# Patient Record
Sex: Female | Born: 1960
Health system: Southern US, Community
[De-identification: ages and names within clinical notes are randomized; demographics above are authoritative.]

## PROBLEM LIST (undated history)

## (undated) DIAGNOSIS — D649 Anemia, unspecified: Secondary | ICD-10-CM

## (undated) DIAGNOSIS — E785 Hyperlipidemia, unspecified: Secondary | ICD-10-CM

## (undated) DIAGNOSIS — N289 Disorder of kidney and ureter, unspecified: Secondary | ICD-10-CM

## (undated) DIAGNOSIS — I10 Essential (primary) hypertension: Secondary | ICD-10-CM

## (undated) DIAGNOSIS — Z923 Personal history of irradiation: Secondary | ICD-10-CM

## (undated) DIAGNOSIS — C801 Malignant (primary) neoplasm, unspecified: Secondary | ICD-10-CM

## (undated) DIAGNOSIS — Z8659 Personal history of other mental and behavioral disorders: Secondary | ICD-10-CM

## (undated) DIAGNOSIS — Z9889 Other specified postprocedural states: Secondary | ICD-10-CM

## (undated) DIAGNOSIS — I509 Heart failure, unspecified: Secondary | ICD-10-CM

## (undated) DIAGNOSIS — M25562 Pain in left knee: Secondary | ICD-10-CM

## (undated) DIAGNOSIS — R0600 Dyspnea, unspecified: Secondary | ICD-10-CM

## (undated) HISTORY — DX: Hyperlipidemia, unspecified: E78.5

## (undated) HISTORY — PX: CHOLECYSTECTOMY: SHX55

## (undated) HISTORY — PX: DILATION AND CURETTAGE OF UTERUS: SHX78

## (undated) HISTORY — DX: Pain in left knee: M25.562

## (undated) HISTORY — DX: Anemia, unspecified: D64.9

## (undated) HISTORY — PX: BREAST BIOPSY: SHX20

## (undated) HISTORY — DX: Other specified postprocedural states: Z98.890

## (undated) HISTORY — DX: Essential (primary) hypertension: I10

---

## 1999-04-08 ENCOUNTER — Emergency Department (HOSPITAL_COMMUNITY): Admission: EM | Admit: 1999-04-08 | Discharge: 1999-04-08 | Payer: Self-pay | Admitting: Emergency Medicine

## 1999-04-09 ENCOUNTER — Encounter: Payer: Self-pay | Admitting: Surgery

## 1999-04-09 ENCOUNTER — Encounter: Admission: RE | Admit: 1999-04-09 | Discharge: 1999-04-09 | Payer: Self-pay | Admitting: Surgery

## 2001-07-14 ENCOUNTER — Emergency Department (HOSPITAL_COMMUNITY): Admission: EM | Admit: 2001-07-14 | Discharge: 2001-07-14 | Payer: Self-pay | Admitting: Emergency Medicine

## 2002-07-15 ENCOUNTER — Inpatient Hospital Stay (HOSPITAL_COMMUNITY): Admission: EM | Admit: 2002-07-15 | Discharge: 2002-07-24 | Payer: Self-pay | Admitting: Emergency Medicine

## 2002-07-15 ENCOUNTER — Encounter: Payer: Self-pay | Admitting: Emergency Medicine

## 2002-07-18 ENCOUNTER — Encounter: Payer: Self-pay | Admitting: *Deleted

## 2002-07-22 ENCOUNTER — Encounter: Payer: Self-pay | Admitting: *Deleted

## 2002-08-23 ENCOUNTER — Emergency Department (HOSPITAL_COMMUNITY): Admission: EM | Admit: 2002-08-23 | Discharge: 2002-08-23 | Payer: Self-pay | Admitting: Emergency Medicine

## 2004-01-18 ENCOUNTER — Emergency Department (HOSPITAL_COMMUNITY): Admission: EM | Admit: 2004-01-18 | Discharge: 2004-01-18 | Payer: Self-pay | Admitting: Emergency Medicine

## 2004-06-16 ENCOUNTER — Emergency Department (HOSPITAL_COMMUNITY): Admission: EM | Admit: 2004-06-16 | Discharge: 2004-06-16 | Payer: Self-pay | Admitting: Emergency Medicine

## 2006-09-04 ENCOUNTER — Ambulatory Visit: Payer: Self-pay | Admitting: Nurse Practitioner

## 2006-09-13 ENCOUNTER — Ambulatory Visit: Payer: Self-pay | Admitting: *Deleted

## 2006-09-18 ENCOUNTER — Encounter: Admission: RE | Admit: 2006-09-18 | Discharge: 2006-09-18 | Payer: Self-pay | Admitting: Family Medicine

## 2006-09-22 ENCOUNTER — Encounter: Admission: RE | Admit: 2006-09-22 | Discharge: 2006-09-22 | Payer: Self-pay | Admitting: Nurse Practitioner

## 2006-09-26 ENCOUNTER — Ambulatory Visit: Payer: Self-pay | Admitting: Nurse Practitioner

## 2006-09-26 ENCOUNTER — Encounter (INDEPENDENT_AMBULATORY_CARE_PROVIDER_SITE_OTHER): Payer: Self-pay | Admitting: Nurse Practitioner

## 2006-09-26 ENCOUNTER — Other Ambulatory Visit: Admission: RE | Admit: 2006-09-26 | Discharge: 2006-09-26 | Payer: Self-pay | Admitting: Family Medicine

## 2006-09-26 ENCOUNTER — Encounter (INDEPENDENT_AMBULATORY_CARE_PROVIDER_SITE_OTHER): Payer: Self-pay | Admitting: *Deleted

## 2006-10-26 ENCOUNTER — Ambulatory Visit (HOSPITAL_COMMUNITY): Admission: RE | Admit: 2006-10-26 | Discharge: 2006-10-26 | Payer: Self-pay | Admitting: Internal Medicine

## 2006-10-29 ENCOUNTER — Emergency Department (HOSPITAL_COMMUNITY): Admission: EM | Admit: 2006-10-29 | Discharge: 2006-10-29 | Payer: Self-pay | Admitting: Emergency Medicine

## 2006-11-06 ENCOUNTER — Ambulatory Visit: Payer: Self-pay | Admitting: Internal Medicine

## 2006-12-20 ENCOUNTER — Ambulatory Visit: Payer: Self-pay | Admitting: Family Medicine

## 2007-12-25 ENCOUNTER — Emergency Department (HOSPITAL_COMMUNITY): Admission: EM | Admit: 2007-12-25 | Discharge: 2007-12-25 | Payer: Self-pay | Admitting: Emergency Medicine

## 2008-01-15 ENCOUNTER — Emergency Department (HOSPITAL_COMMUNITY): Admission: EM | Admit: 2008-01-15 | Discharge: 2008-01-15 | Payer: Self-pay | Admitting: Emergency Medicine

## 2008-11-16 ENCOUNTER — Emergency Department (HOSPITAL_COMMUNITY): Admission: EM | Admit: 2008-11-16 | Discharge: 2008-11-16 | Payer: Self-pay | Admitting: Emergency Medicine

## 2008-12-04 ENCOUNTER — Ambulatory Visit: Payer: Self-pay | Admitting: Internal Medicine

## 2008-12-04 ENCOUNTER — Encounter: Payer: Self-pay | Admitting: Internal Medicine

## 2008-12-04 DIAGNOSIS — H6690 Otitis media, unspecified, unspecified ear: Secondary | ICD-10-CM

## 2008-12-04 DIAGNOSIS — J209 Acute bronchitis, unspecified: Secondary | ICD-10-CM

## 2008-12-04 HISTORY — DX: Acute bronchitis, unspecified: J20.9

## 2008-12-04 HISTORY — DX: Otitis media, unspecified, unspecified ear: H66.90

## 2009-02-09 ENCOUNTER — Ambulatory Visit: Payer: Self-pay | Admitting: Family Medicine

## 2009-02-09 ENCOUNTER — Encounter (INDEPENDENT_AMBULATORY_CARE_PROVIDER_SITE_OTHER): Payer: Self-pay | Admitting: Family Medicine

## 2009-02-09 LAB — CONVERTED CEMR LAB
ALT: 27 units/L (ref 0–35)
Albumin: 4.1 g/dL (ref 3.5–5.2)
CO2: 22 meq/L (ref 19–32)
Calcium: 9.2 mg/dL (ref 8.4–10.5)
Chloride: 103 meq/L (ref 96–112)
Cholesterol: 276 mg/dL — ABNORMAL HIGH (ref 0–200)
Sodium: 138 meq/L (ref 135–145)
Total Protein: 7 g/dL (ref 6.0–8.3)

## 2009-02-13 ENCOUNTER — Telehealth (INDEPENDENT_AMBULATORY_CARE_PROVIDER_SITE_OTHER): Payer: Self-pay | Admitting: *Deleted

## 2009-03-02 ENCOUNTER — Ambulatory Visit: Payer: Self-pay | Admitting: Internal Medicine

## 2009-03-18 ENCOUNTER — Observation Stay (HOSPITAL_COMMUNITY): Admission: AD | Admit: 2009-03-18 | Discharge: 2009-03-20 | Payer: Self-pay | Admitting: Obstetrics & Gynecology

## 2009-03-18 ENCOUNTER — Encounter: Payer: Self-pay | Admitting: Emergency Medicine

## 2009-03-19 ENCOUNTER — Encounter: Payer: Self-pay | Admitting: Obstetrics & Gynecology

## 2009-03-30 ENCOUNTER — Encounter (INDEPENDENT_AMBULATORY_CARE_PROVIDER_SITE_OTHER): Payer: Self-pay | Admitting: Internal Medicine

## 2009-03-30 ENCOUNTER — Ambulatory Visit: Payer: Self-pay | Admitting: Internal Medicine

## 2009-03-30 LAB — CONVERTED CEMR LAB
Albumin: 4 g/dL (ref 3.5–5.2)
CO2: 25 meq/L (ref 19–32)
Calcium: 9.2 mg/dL (ref 8.4–10.5)
Chloride: 105 meq/L (ref 96–112)
Cholesterol: 228 mg/dL — ABNORMAL HIGH (ref 0–200)
Glucose, Bld: 99 mg/dL (ref 70–99)
Potassium: 4 meq/L (ref 3.5–5.3)
Sodium: 142 meq/L (ref 135–145)
Total Bilirubin: 0.4 mg/dL (ref 0.3–1.2)
Total Protein: 6.5 g/dL (ref 6.0–8.3)
Triglycerides: 139 mg/dL (ref <150)

## 2009-05-27 ENCOUNTER — Ambulatory Visit: Payer: Self-pay | Admitting: Internal Medicine

## 2009-06-23 ENCOUNTER — Ambulatory Visit: Payer: Self-pay | Admitting: Internal Medicine

## 2009-07-10 ENCOUNTER — Ambulatory Visit: Payer: Self-pay | Admitting: Internal Medicine

## 2009-09-21 ENCOUNTER — Ambulatory Visit: Payer: Self-pay | Admitting: Internal Medicine

## 2009-10-23 ENCOUNTER — Encounter (INDEPENDENT_AMBULATORY_CARE_PROVIDER_SITE_OTHER): Payer: Self-pay | Admitting: Adult Health

## 2009-10-23 ENCOUNTER — Ambulatory Visit: Payer: Self-pay | Admitting: Internal Medicine

## 2009-10-23 LAB — CONVERTED CEMR LAB
Amphetamine Screen, Ur: NEGATIVE
Benzodiazepines.: NEGATIVE
Cocaine Metabolites: POSITIVE — AB
Creatinine,U: 395.6 mg/dL
Microalb, Ur: 11.85 mg/dL — ABNORMAL HIGH (ref 0.00–1.89)
Pap Smear: NEGATIVE
Phencyclidine (PCP): NEGATIVE

## 2009-11-05 ENCOUNTER — Ambulatory Visit (HOSPITAL_COMMUNITY): Admission: RE | Admit: 2009-11-05 | Discharge: 2009-11-05 | Payer: Self-pay | Admitting: Internal Medicine

## 2010-09-23 LAB — CBC
HCT: 27.4 % — ABNORMAL LOW (ref 36.0–46.0)
Hemoglobin: 9.5 g/dL — ABNORMAL LOW (ref 12.0–15.0)
Platelets: 174 10*3/uL (ref 150–400)
WBC: 7.9 10*3/uL (ref 4.0–10.5)

## 2010-09-24 LAB — CBC
MCHC: 33.8 g/dL (ref 30.0–36.0)
MCV: 89 fL (ref 78.0–100.0)
Platelets: 235 10*3/uL (ref 150–400)
Platelets: 213 10*3/uL (ref 150–400)
RBC: 4.14 MIL/uL (ref 3.87–5.11)
WBC: 12.2 10*3/uL — ABNORMAL HIGH (ref 4.0–10.5)

## 2010-09-24 LAB — POCT I-STAT, CHEM 8
BUN: 6 mg/dL (ref 6–23)
Creatinine, Ser: 0.6 mg/dL (ref 0.4–1.2)
Glucose, Bld: 131 mg/dL — ABNORMAL HIGH (ref 70–99)
Hemoglobin: 12.9 g/dL (ref 12.0–15.0)
Potassium: 2.9 mEq/L — ABNORMAL LOW (ref 3.5–5.1)

## 2010-09-24 LAB — URINE MICROSCOPIC-ADD ON

## 2010-09-24 LAB — BASIC METABOLIC PANEL
BUN: 10 mg/dL (ref 6–23)
CO2: 24 mEq/L (ref 19–32)
Calcium: 7.6 mg/dL — ABNORMAL LOW (ref 8.4–10.5)
Calcium: 8.3 mg/dL — ABNORMAL LOW (ref 8.4–10.5)
Creatinine, Ser: 0.95 mg/dL (ref 0.4–1.2)
Creatinine, Ser: 0.97 mg/dL (ref 0.4–1.2)
GFR calc non Af Amer: >60 mL/min (ref >60)
Glucose, Bld: 120 mg/dL — ABNORMAL HIGH (ref 70–99)
Potassium: 3.9 mEq/L (ref 3.5–5.1)
Sodium: 136 mEq/L (ref 135–145)

## 2010-09-24 LAB — PROTIME-INR
INR: 1 (ref 0.00–1.49)
INR: 1 (ref 0.00–1.49)
Prothrombin Time: 12.9 seconds (ref 11.6–15.2)

## 2010-09-24 LAB — TYPE AND SCREEN
ABO/RH(D): O POS
ABO/RH(D): O POS
Antibody Screen: NEGATIVE
Antibody Screen: NEGATIVE

## 2010-09-24 LAB — URINALYSIS, ROUTINE W REFLEX MICROSCOPIC
Bilirubin Urine: NEGATIVE
Nitrite: NEGATIVE
Specific Gravity, Urine: 1.019 (ref 1.005–1.030)
pH: 7.5 (ref 5.0–8.0)

## 2010-09-24 LAB — DIFFERENTIAL
Basophils Absolute: 0 10*3/uL (ref 0.0–0.1)
Basophils Relative: 0 % (ref 0–1)
Eosinophils Absolute: 0 10*3/uL (ref 0.0–0.7)
Monocytes Relative: 8 % (ref 3–12)
Neutro Abs: 4.7 10*3/uL (ref 1.7–7.7)
Neutrophils Relative %: 59 % (ref 43–77)

## 2010-09-24 LAB — GC/CHLAMYDIA PROBE AMP, GENITAL
Chlamydia, DNA Probe: NEGATIVE
GC Probe Amp, Genital: NEGATIVE

## 2010-09-24 LAB — ABO/RH: ABO/RH(D): O POS

## 2010-09-24 LAB — WET PREP, GENITAL: Yeast Wet Prep HPF POC: NONE SEEN

## 2010-11-05 NOTE — Discharge Summary (Signed)
Angela Gardner, Angela Gardner                         ACCOUNT NO.:  [id number]   MEDICAL RECORD NO.:  [id number]                   PATIENT TYPE:  INP   LOCATION:  0361                                 FACILITY:  [facility]   PHYSICIAN:  Lazaro Arms, M.D.        DATE OF BIRTH:  1961/04/06   DATE OF ADMISSION:  07/15/2002  DATE OF DISCHARGE:  07/24/2002                                 DISCHARGE SUMMARY   PRIMARY CARE PHYSICIAN:  [facility].   DISCHARGE DIAGNOSES:  1. Right lower lobe pneumonia.  2. Microcytic anemia likely secondary to menorrhagia and borderline elevated     blood sugar.   BRIEF HISTORY:  The patient was admitted on the 26th of January with  increasing right pleuritic chest pain, cough and shortness of breath. The  patient came to the emergency room and was found to have a consolidated  right lower lobe pneumonia. Her initial hemoglobin was 9.3 with an MCV of 70  and she initially had an O2 requirement. Over the next week, the patient  slowly weaned off oxygen and her shortness of breath improved and she was  able to ambulate without any difficulty. Her pleuritic chest pain was  eventually well controlled with Vicodin. Her hemoglobin remained stable  throughout the entire hospitalization. The patient was discharged home in  stable condition on the February 4th. She was able to eat breakfast without  any difficulty. She was able to ambulate without any dizziness or  difficulty. She was 98.8, pulse of 60, blood pressure 109/62, respirations  16, she was 98% on room air. Her exam is remarkable for markedly decreased  breath sounds in the right lower lobe. She had a repeat chest x-ray on the  second of February which showed some interval improvement in overall lung  aeration although she still had a relatively significant persistence of the  right lower lobe infiltrate atelectasis and consolidation.   DISCHARGE PLAN:  She is to go home. She was given instructions and  the  number to call [facility] and will sign up for [facility] tomorrow. She  was aware that she needs to follow up next week and that she will need a  repeat chest x-ray in two weeks. If her pneumonia does not clear in two  weeks, she will likely need a chest CT. With her history of smoking, it is  possible that this could also represent a post obstructive pneumonia. In  addition, she will followup with [facility] for a followup blood count and  they will be able to follow her menorrhagia at that point.   DISCHARGE MEDICATIONS:  Ceftin 500 mg p.o. b.i.d. for the next day. She was  given samples of this and iron and sulfate 325 mg p.o. b.i.d. and Vicodin 12  tabs, every 4-6 hours as needed.  Lazaro Arms, M.D.    AMC/MEDQ  D:  07/24/2002  T:  07/24/2002  Job:  [id number]   cc:   [facility]

## 2011-03-17 LAB — DIFFERENTIAL
Basophils Relative: 1
Eosinophils Absolute: 0
Eosinophils Relative: 0
Monocytes Relative: 12
Neutrophils Relative %: 60

## 2011-03-17 LAB — CBC
MCHC: 33.1
MCV: 80.8
RBC: 4.92

## 2011-06-21 HISTORY — PX: BREAST EXCISIONAL BIOPSY: SUR124

## 2011-10-26 ENCOUNTER — Other Ambulatory Visit: Payer: Self-pay | Admitting: Obstetrics and Gynecology

## 2011-10-26 DIAGNOSIS — Z1231 Encounter for screening mammogram for malignant neoplasm of breast: Secondary | ICD-10-CM

## 2011-10-28 ENCOUNTER — Encounter: Payer: Self-pay | Admitting: *Deleted

## 2011-10-28 ENCOUNTER — Ambulatory Visit (INDEPENDENT_AMBULATORY_CARE_PROVIDER_SITE_OTHER): Payer: Self-pay | Admitting: *Deleted

## 2011-10-28 ENCOUNTER — Ambulatory Visit: Payer: Self-pay

## 2011-10-28 ENCOUNTER — Ambulatory Visit (HOSPITAL_COMMUNITY)
Admission: RE | Admit: 2011-10-28 | Discharge: 2011-10-28 | Disposition: A | Discharge status: Home / Self Care | Payer: Self-pay | Source: Ambulatory Visit | Attending: Obstetrics and Gynecology | Admitting: Obstetrics and Gynecology

## 2011-10-28 VITALS — BP 186/105 | HR 101 | Temp 97.6°F | Ht 64.0 in | Wt 226.1 lb

## 2011-10-28 DIAGNOSIS — Z1231 Encounter for screening mammogram for malignant neoplasm of breast: Secondary | ICD-10-CM

## 2011-10-28 DIAGNOSIS — Z1239 Encounter for other screening for malignant neoplasm of breast: Secondary | ICD-10-CM

## 2011-10-28 NOTE — Patient Instructions (Signed)
Taught patient how to perform BSE. Patient did not need a Pap smear today due to last Pap smear was 10/23/09.  Let her know [facility] will cover Pap smears every 3 years unless has a history of abnormal Pap smears. Patient escorted to mammography for screening mammogram. Patient aware of appointment and will be there. Let patient know will follow up with her within the next couple weeks with results. Patient verbalized understanding.

## 2011-10-28 NOTE — Progress Notes (Signed)
No complaints today.  Pap Smear:    Pap smear not performed today. Patients last Pap smear was 10/23/09 at [facility] and was normal. Per patient she has no history of abnormal Pap smears. Pap smear result above is in EPIC.  Physical exam: Breasts Breasts symmetrical. No skin abnormalities bilateral breasts. No nipple retraction bilateral breasts. No nipple discharge bilateral breasts. No lymphadenopathy. No lumps palpated bilateral breasts. No complaints of pain or tenderness on exam.       Pelvic/Bimanual No Pap smear completed today since last Pap smear was 10/23/09 and normal. Pap smear not indicated per [facility] guidelines.

## 2011-11-01 ENCOUNTER — Telehealth: Payer: Self-pay

## 2011-11-01 NOTE — Telephone Encounter (Signed)
Called pt and informed pt that she needed to have further imaging of her left breast.  Pt stated that she was unaware that she needed to call but she did have the # to the [facility] to be able to call and schedule appt.  I informed pt that [facility] will cover her appt.  Pt stated understanding and that she would call to schedule to appt.

## 2011-11-02 ENCOUNTER — Other Ambulatory Visit: Payer: Self-pay | Admitting: Obstetrics and Gynecology

## 2011-11-02 DIAGNOSIS — R928 Other abnormal and inconclusive findings on diagnostic imaging of breast: Secondary | ICD-10-CM

## 2011-11-08 ENCOUNTER — Other Ambulatory Visit: Payer: Self-pay

## 2011-11-11 ENCOUNTER — Encounter: Payer: Self-pay | Admitting: *Deleted

## 2011-11-22 ENCOUNTER — Ambulatory Visit
Admission: RE | Admit: 2011-11-22 | Discharge: 2011-11-22 | Disposition: A | Discharge status: Home / Self Care | Payer: No Typology Code available for payment source | Source: Ambulatory Visit | Attending: Obstetrics and Gynecology | Admitting: Obstetrics and Gynecology

## 2011-11-22 ENCOUNTER — Other Ambulatory Visit: Payer: Self-pay | Admitting: Obstetrics and Gynecology

## 2011-11-22 DIAGNOSIS — R928 Other abnormal and inconclusive findings on diagnostic imaging of breast: Secondary | ICD-10-CM

## 2011-11-22 DIAGNOSIS — N6320 Unspecified lump in the left breast, unspecified quadrant: Secondary | ICD-10-CM

## 2011-11-23 ENCOUNTER — Ambulatory Visit
Admission: RE | Admit: 2011-11-23 | Discharge: 2011-11-23 | Disposition: A | Discharge status: Home / Self Care | Payer: No Typology Code available for payment source | Source: Ambulatory Visit | Attending: Obstetrics and Gynecology | Admitting: Obstetrics and Gynecology

## 2011-11-23 ENCOUNTER — Other Ambulatory Visit: Payer: Self-pay | Admitting: Diagnostic Radiology

## 2011-11-23 ENCOUNTER — Other Ambulatory Visit: Payer: Self-pay | Admitting: Obstetrics and Gynecology

## 2011-11-23 DIAGNOSIS — R928 Other abnormal and inconclusive findings on diagnostic imaging of breast: Secondary | ICD-10-CM

## 2011-11-23 DIAGNOSIS — N6320 Unspecified lump in the left breast, unspecified quadrant: Secondary | ICD-10-CM

## 2011-11-24 ENCOUNTER — Other Ambulatory Visit: Payer: Self-pay | Admitting: Obstetrics and Gynecology

## 2011-11-24 DIAGNOSIS — C50912 Malignant neoplasm of unspecified site of left female breast: Secondary | ICD-10-CM

## 2011-11-28 ENCOUNTER — Encounter (HOSPITAL_COMMUNITY): Payer: Self-pay | Admitting: Pharmacy Technician

## 2011-11-28 ENCOUNTER — Ambulatory Visit (INDEPENDENT_AMBULATORY_CARE_PROVIDER_SITE_OTHER): Payer: PRIVATE HEALTH INSURANCE | Admitting: Surgery

## 2011-11-28 ENCOUNTER — Encounter (INDEPENDENT_AMBULATORY_CARE_PROVIDER_SITE_OTHER): Payer: Self-pay | Admitting: Surgery

## 2011-11-28 ENCOUNTER — Other Ambulatory Visit (INDEPENDENT_AMBULATORY_CARE_PROVIDER_SITE_OTHER): Payer: Self-pay | Admitting: Surgery

## 2011-11-28 VITALS — BP 168/110 | HR 76 | Temp 97.1°F | Ht 64.0 in | Wt 224.4 lb

## 2011-11-28 DIAGNOSIS — D249 Benign neoplasm of unspecified breast: Secondary | ICD-10-CM

## 2011-11-28 HISTORY — DX: Benign neoplasm of unspecified breast: D24.9

## 2011-11-28 NOTE — Progress Notes (Signed)
Patient ID: Angela Gardner, female   DOB: 07/22/1960, 51 y.o.   MRN: [id number]  Chief Complaint  Patient presents with  . Pre-op Exam    eval Lt br palp    HPI Angela Gardner is a 51 y.o. female.  Referred by Dr. 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  She apparently had a lumpectomy, followed by radiation, but no lymph node biopsy or chemotherapy.  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  Recently, she had a routine screening mammogram which showed a mass in the left retroareolar region.  This was biopsied and returned a final diagnosis of intraductal papilloma.  The patient reports longstanding pain in the left breast in addition to some pain in her left arm.  No nipple discharge.  Past Medical History  Diagnosis Date  . Hypertension   . Hyperlipemia   . Hx of left breast biopsy   . Anemia     Past Surgical History  Procedure Date  . Cholecystectomy   . Breast biopsy     Family History  Problem Relation Age of Onset  . Anuerysm Mother     Social History History  Substance Use Topics  . Smoking status: Current Everyday Smoker -- 0.3 packs/day for 26 years    Types: Cigarettes  . Smokeless tobacco: Never Used  . Alcohol Use: Yes     social drinking    Allergies  Allergen Reactions  . Vicodin (Hydrocodone-Acetaminophen)   itching  Current Outpatient Prescriptions  Medication Sig Dispense Refill  . NON FORMULARY Blood pressure medication pt not sure what medication      . pravastatin (PRAVACHOL) 20 MG tablet Take 20 mg by mouth daily.        Review of Systems Review of Systems  Constitutional: Negative for fever, chills and unexpected weight change.  HENT: Negative for hearing loss, congestion, sore throat, trouble swallowing and voice change.   Eyes: Negative for visual disturbance.  Respiratory: Negative for cough and wheezing.   Cardiovascular: Negative for chest pain,  palpitations and leg swelling.  Gastrointestinal: Negative for nausea, vomiting, abdominal pain, diarrhea, constipation, blood in stool, abdominal distention and anal bleeding.  Genitourinary: Negative for hematuria, vaginal bleeding and difficulty urinating.  Musculoskeletal: Negative for arthralgias.  Skin: Negative for rash and wound.  Neurological: Negative for seizures, syncope and headaches.  Hematological: Negative for adenopathy. Does not bruise/bleed easily.  Psychiatric/Behavioral: Negative for confusion.    Blood pressure 168/110, pulse 76, temperature 97.1 F (36.2 C), temperature source Temporal, height 5' 4" (1.626 m), weight 224 lb 6.4 oz (101.787 kg), SpO2 97.00%.  Physical Exam Physical Exam WDWN in NAD HEENT:  EOMI, sclera anicteric Neck:  No masses, no thyromegaly Lungs:  CTA bilaterally; normal respiratory effort Breasts - symmetric; no palpable masses; no nipple retraction or discharge; no lymphadenopathy Small skin tag in left axilla Healed left circumareolar incision CV:  Regular rate and rhythm; no murmurs Abd:  +bowel sounds, soft, non-tender, no masses Ext:  Well-perfused; no edema Skin:  Warm, dry; no sign of jaundice  Data Reviewed Mammogram/ ultrasound   US Core Biopsy   Status: Edited Result - FINAL        Addendum     Jeffrey T Hu, MD   Fri Nov 25, 2011  1:33:19 PM EDT       **ADDENDUM** CREATED: 11/25/2011 13:27:04   THE VERBAL REPORT FROM PATHOLOGY OF DUCTAL CARCINOMA (11/24/2011) HAS   BEEN CHANGED FOLLOWING FURTHER EVALUATION AND STAINS.   FINAL WRITTEN SURGICAL PATHOLOGY DEMONSTRATES AN INTRADUCTAL PAPILLOMA WITH ABUNDANT NECROSIS.  SURGICAL EXCISION IS RECOMMENDED.   The patient was contacted on 11/25/2011 and these results explained to her, which she understood.  Her questions were answered.   The patient's existing MRI and [facility] appointments have been cancelled. An appointment with Dr. Tseui ([facility]) has  been scheduled for 11/28/2011 and the patient informed.   **END ADDENDUM** SIGNED BY: Jeffrey T. Hu, M.D.     Study Result     *RADIOLOGY REPORT*   Clinical Data:  51-year-old female with suspicious 5 x 7 x 11 mm irregular mass in the subareolar left breast with adjacent distortion.  History of surgical excision in the subareolar region in 1999 - unknown pathology.   ULTRASOUND GUIDED CORE BIOPSY OF THE LEFT BREAST   Comparison: Recent mammograms and ultrasound from the [facility].   I met with the patient and we discussed the procedure of ultrasound- guided biopsy, including benefits and alternatives.  We discussed the high likelihood of a successful procedure. We discussed the risks of the procedure, including infection, bleeding, tissue injury, clip migration, and inadequate sampling.  Informed written consent was given.   Using sterile technique 2% lidocaine, ultrasound guidance and a 14 gauge automated biopsy device, biopsy was performed of 5 x 7 x 11 mm irregular hypoechoic mass in the medial subareolar left breast and adjacent architectural distortion.  At the conclusion of the procedure a tissue marker clip was deployed into the biopsy cavity. Follow up 2 view mammogram was performed and dictated separately.   IMPRESSION: Ultrasound guided biopsy of left breast mass/distortion.  No apparent complications.   Final pathology demonstrates DUCTAL CARCINOMA.  Further evaluation for invasion is pending. Histology correlates with imaging findings.   The patient was contacted by phone on 11/24/2011 and these results given to her which she understood. Her questions were answered. The patient had no complaints with her biopsy site. The patient was encouraged to return to the Breast Center for breast cancer educational material.   Recommend surgery - oncology consultation.  An appointment at the [facility] has been scheduled for 11/30/2011 and the patient  informed. Recommend bilateral breast MRI which has been scheduled for 11/29/2011 and the patient informed.   Original Report Authenticated By: JEFFREY T. HU, M.D.                         Assessment    Intraductal papilloma - left breast.      Plan    Recommend left needle-localized lumpectomy.  The surgical procedure has been discussed with the patient.  Potential risks, benefits, alternative treatments, and expected outcomes have been explained.  All of the patient's questions at this time have been answered.  The likelihood of reaching the patient's treatment goal is good.  The patient understand the proposed surgical procedure and wishes to proceed. She understands that the final pathology might show some invasive carcinoma, for which she might need further surgery.       Joanmarie Tsang K. 11/28/2011, 11:32 AM    

## 2011-11-29 ENCOUNTER — Other Ambulatory Visit: Payer: Self-pay

## 2011-11-30 ENCOUNTER — Encounter (INDEPENDENT_AMBULATORY_CARE_PROVIDER_SITE_OTHER): Payer: Self-pay | Admitting: Surgery

## 2011-11-30 ENCOUNTER — Encounter (HOSPITAL_COMMUNITY): Payer: Self-pay

## 2011-11-30 ENCOUNTER — Ambulatory Visit (HOSPITAL_COMMUNITY)
Admission: RE | Admit: 2011-11-30 | Discharge: 2011-11-30 | Disposition: A | Discharge status: Home / Self Care | Payer: Self-pay | Source: Ambulatory Visit | Attending: Anesthesiology | Admitting: Anesthesiology

## 2011-11-30 ENCOUNTER — Encounter (HOSPITAL_COMMUNITY)
Admission: RE | Admit: 2011-11-30 | Discharge: 2011-11-30 | Disposition: A | Discharge status: Home / Self Care | Payer: Self-pay | Source: Ambulatory Visit | Attending: Surgery | Admitting: Surgery

## 2011-11-30 DIAGNOSIS — I517 Cardiomegaly: Secondary | ICD-10-CM | POA: Insufficient documentation

## 2011-11-30 DIAGNOSIS — Z01818 Encounter for other preprocedural examination: Secondary | ICD-10-CM | POA: Insufficient documentation

## 2011-11-30 HISTORY — DX: Malignant (primary) neoplasm, unspecified: C80.1

## 2011-11-30 LAB — HCG, SERUM, QUALITATIVE: Preg, Serum: NEGATIVE

## 2011-11-30 LAB — CBC
HCT: 40.6 % (ref 36.0–46.0)
Hemoglobin: 13.1 g/dL (ref 12.0–15.0)
MCH: 28.4 pg (ref 26.0–34.0)
MCHC: 32.3 g/dL (ref 30.0–36.0)
MCV: 87.9 fL (ref 78.0–100.0)

## 2011-11-30 LAB — BASIC METABOLIC PANEL
BUN: 15 mg/dL (ref 6–23)
Chloride: 106 mEq/L (ref 96–112)
GFR calc non Af Amer: 72 mL/min — ABNORMAL LOW (ref >90)
Glucose, Bld: 95 mg/dL (ref 70–99)
Potassium: 4.5 mEq/L (ref 3.5–5.1)

## 2011-11-30 NOTE — Progress Notes (Signed)
Patient states she had not had period for a year but started bleeding mid May 2013 and still bleeding-doing serum pregnancy per anesthesia order. Patient received card at breast center on a place on [address] to go for papsmear. Encouraged patient to go.  Spoke with Shonna Chock PA about elevated BP. She saw in EPIC it was elevated at last office visit in [facility]. Patient was on HTN med but stopped taking it and does not remember name of med or when she stopped. She had received the RX from [facility].  Doing EKG and Revonda Standard will review.

## 2011-11-30 NOTE — Consult Note (Addendum)
Anesthesia Consult:  Patient is a 51 year old female scheduled for a left breast lumpectomy with needle localization on 12/07/11.  History includes prior diagnosis of breast cancer s/p lumpectomy and radiation in '99, HTN (currently untreated), smoking, anemia, HLD, obesity.  She does not currently have a PCP but was seen at [facility] in 2011.  [facility]) was also recently recommended for her, but she is yet to get established there.  Her BP today was 162/100 (machine) and 172/120 (manual).  It was 168/110 at [facility] on 11/28/11.  Her EKG showed NSR with sinus arrhythmia, possible LAE, LAD, LVH with repolarization abnormality (T wave inversion in aVL and more nonspecific changes in I).  Patient denies CP, SOB, and headaches.    CXR on 11/30/11 showed mild cardiomegaly without acute disease.   Labs noted.    I contacted [facility] and was told she could likely be seen pre-operatively if she would go by their office tomorrow to complete the appropriate paperwork.  She is interested in trying the [facility] first.  She understands that her procedure may be cancelled if her hypertension is not controlled.  I updated a triage nurse at [facility] and will also send a staff message to Dr. Corliss Skains and his nurse Wilder Glade. I've asked the patient to call and update staff in Short Stay.    If her BP is better controlled on the day of surgery, then plan to proceed.  Anesthesiologist Dr. Michelle Piper agrees with this plan.  Shonna Chock, PA-C 11/30/11 1735  Addendum: 12/05/11 1230  A Short Stay nurse followed up with Ms. Hilley earlier today re: HTN follow-up.  She could not be seen at [facility] or [facility] in a timely manner, so she went to Urgent Care on 12/02/11.  She was started on lisinopril/HCTZ 10/12.5 MG Q AM.  She has been checking her BP with a home BP cuff.  On 12/04/11 her BP was 127/68 and on 12/05/11 her BP was 120/70.  She was instructed to take this medication on the morning of  surgery.  Our nurse updated Dr. Fatima Sanger office.  Of note she was also started on Zithromax and albuterol MDI PRN for bronchitis at that visit and was told to follow-up if worsening symptoms.  Patient did not feel she needed to go back to urgent care.  She is due to complete her antibiotic on 12/06/11.  If her BP remains reasonable and no worrisome respiratory symptoms, then she should be able to proceed.

## 2011-11-30 NOTE — Pre-Procedure Instructions (Signed)
20 Angela Gardner  11/30/2011   Your procedure is scheduled on:  12/07/11  Report to [name] at 7:45 AM.  Call this number if you have problems the morning of surgery: [phone]   Remember: Discontinue Aspirin, Coumadin, Plavix, Effient and herbal medications.   Do not eat food or drink liquids after midnight.   Take these medicines the morning of surgery with A SIP OF WATER: None   Do not wear jewelry, make-up or nail polish.  Do not wear lotions, powders, or perfumes. You may wear deodorant.  Do not shave 48 hours prior to surgery. Men may shave face and neck.  Do not bring valuables to the hospital.  Contacts, dentures or bridgework may not be worn into surgery.  Leave suitcase in the car. After surgery it may be brought to your room.  For patients admitted to the hospital, checkout time is 11:00 AM the day of discharge.   Patients discharged the day of surgery will not be allowed to drive home.  Name and phone number of your driver: sister or best friend.  Special Instructions: CHG Shower Use Special Wash: 1/2 bottle night before surgery and 1/2 bottle morning of surgery.   Please read over the following fact sheets that you were given: Pain Booklet, Coughing and Deep Breathing, MRSA Information and Surgical Site Infection Prevention

## 2011-12-02 ENCOUNTER — Emergency Department (INDEPENDENT_AMBULATORY_CARE_PROVIDER_SITE_OTHER): Payer: Self-pay

## 2011-12-02 ENCOUNTER — Emergency Department (INDEPENDENT_AMBULATORY_CARE_PROVIDER_SITE_OTHER)
Admission: EM | Admit: 2011-12-02 | Discharge: 2011-12-02 | Disposition: A | Discharge status: Home / Self Care | Payer: Self-pay | Source: Home / Self Care | Attending: Emergency Medicine | Admitting: Emergency Medicine

## 2011-12-02 ENCOUNTER — Encounter (HOSPITAL_COMMUNITY): Payer: Self-pay

## 2011-12-02 DIAGNOSIS — J40 Bronchitis, not specified as acute or chronic: Secondary | ICD-10-CM

## 2011-12-02 DIAGNOSIS — I10 Essential (primary) hypertension: Secondary | ICD-10-CM

## 2011-12-02 LAB — POCT URINALYSIS DIP (DEVICE)
Nitrite: NEGATIVE
Protein, ur: NEGATIVE mg/dL
Urobilinogen, UA: 0.2 mg/dL (ref 0.0–1.0)
pH: 5.5 (ref 5.0–8.0)

## 2011-12-02 MED ORDER — AZITHROMYCIN 250 MG PO TABS: 250.0000 mg | ORAL_TABLET | Freq: Every day | ORAL | Status: AC

## 2011-12-02 MED ORDER — LISINOPRIL-HYDROCHLOROTHIAZIDE 10-12.5 MG PO TABS: 1.0000 | ORAL_TABLET | Freq: Every day | ORAL | Status: DC

## 2011-12-02 MED ORDER — ALBUTEROL SULFATE HFA 108 (90 BASE) MCG/ACT IN AERS: 2.0000 | INHALATION_SPRAY | RESPIRATORY_TRACT | Status: DC | PRN

## 2011-12-02 NOTE — ED Notes (Signed)
States she was seen at pre-op , and told she had to have pressure under control prior to general anesthesia for breast biopsy (2nd one) ; sister died in surgery due to cerebral aneurysm ; has been having menses for 1 month

## 2011-12-02 NOTE — ED Provider Notes (Signed)
History     CSN: [id number]  Arrival date & time 12/02/11  1609   First MD Initiated Contact with Patient 12/02/11 1657      Chief Complaint  Patient presents with  . Hypertension    (Consider location/radiation/quality/duration/timing/severity/associated sxs/prior treatment) HPI Comments: Patient has a history of hypertension, has not been on any medications in 3 years, and was discovered to have elevated blood pressure during her preop physical. It was measured multiple times and ranged 162 -172 /100-120. She is scheduled for breast surgery next Wednesday for intraductal papilloma.  she was told that they would not do surgery on her until her blood pressure got under control. She went to [facility], but has not been there in 3 years. They were unable to see her, and sent her here for evaluation. She denies any headache, blurry vision, discoordination, dysarthria, or leg weakness. No chest pain,, shortness of breath, palpitations, LE swelling, pain radiating to her back or abdomen. No hematuria, decreased urine output. No decongestant, stimulant use, excessive caffeine, other drug use. She does note that she has been coughing and wheezing more than usual for the past several days. No hemoptysis, productive cough. She is a smoker.   ROS as noted in HPI. All other ROS negative.   Patient is a 51 y.o. female presenting with hypertension. The history is provided by the patient. No language interpreter was used.  Hypertension This is a chronic problem. The current episode started more than 1 week ago. The problem has not changed since onset.Pertinent negatives include no chest pain, no abdominal pain, no headaches and no shortness of breath. Nothing aggravates the symptoms. Nothing relieves the symptoms. She has tried nothing for the symptoms. The treatment provided no relief.    Past Medical History  Diagnosis Date  . Hyperlipemia   . Hx of left breast biopsy   . Anemia   . Hypertension      not on any medication now-[facility] had  prescribed her a med and she stopped taking them cannot remember when  . Cancer     left ductal papilloma    Past Surgical History  Procedure Date  . Cholecystectomy   . Breast biopsy   . Dilation and curettage of uterus     approx 1 year ago  . Cesarean section     one    Family History  Problem Relation Age of Onset  . Anuerysm Mother     History  Substance Use Topics  . Smoking status: Current Everyday Smoker -- 0.3 packs/day for 26 years    Types: Cigarettes  . Smokeless tobacco: Never Used  . Alcohol Use: Yes     social drinking    OB History    Grav Para Term Preterm Abortions TAB SAB Ect Mult Living   3 1 1  2 2    1       Review of Systems  Respiratory: Negative for shortness of breath.   Cardiovascular: Negative for chest pain.  Gastrointestinal: Negative for abdominal pain.  Neurological: Negative for headaches.    Allergies  Vicodin  Home Medications   Current Outpatient Rx  Name Route Sig Dispense Refill  . ALBUTEROL SULFATE HFA 108 (90 BASE) MCG/ACT IN AERS Inhalation Inhale 2 puffs into the lungs every 4 (four) hours as needed for wheezing. Dispense with aerochamber 1 Inhaler 0  . AZITHROMYCIN 250 MG PO TABS Oral Take 1 tablet (250 mg total) by mouth daily. 2 tabs po on day 1, 1  tab po on days 2-5 6 tablet 0  . LISINOPRIL-HYDROCHLOROTHIAZIDE 10-12.5 MG PO TABS Oral Take 1 tablet by mouth daily. 30 tablet 0    BP 196/106  Pulse 78  Temp 97.8 F (36.6 C) (Oral)  Resp 16  SpO2 98%  LMP 11/02/2011  Physical Exam  Nursing note and vitals reviewed. Constitutional: She is oriented to person, place, and time. She appears well-developed and well-nourished.  HENT:  Head: Normocephalic and atraumatic.  Eyes: Conjunctivae and EOM are normal. Pupils are equal, round, and reactive to light.  Fundoscopic exam:      The right eye shows no hemorrhage and no papilledema.       The left eye shows no  hemorrhage and no papilledema.  Neck: Normal range of motion.  Cardiovascular: Normal rate, regular rhythm, normal heart sounds and intact distal pulses.   No murmur heard. Pulmonary/Chest: Effort normal. No respiratory distress. She has wheezes in the left middle field and the left lower field. She has no rhonchi. She has no rales. She exhibits no tenderness.  Abdominal: Soft. Bowel sounds are normal. She exhibits no distension. There is no tenderness. There is no rebound and no guarding.  Musculoskeletal: Normal range of motion. She exhibits edema. She exhibits no tenderness.       1+ edema to mid shin bilaterally  Neurological: She is alert and oriented to person, place, and time. She has normal strength. No cranial nerve deficit or sensory deficit. Coordination and gait normal.  Skin: Skin is warm and dry.  Psychiatric: She has a normal mood and affect. Her behavior is normal. Judgment and thought content normal.    ED Course  Procedures (including critical care time)  Labs Reviewed  POCT URINALYSIS DIP (DEVICE) - Abnormal; Notable for the following:    Hgb urine dipstick LARGE (*)     Leukocytes, UA SMALL (*)  Biochemical Testing Only. Please order routine urinalysis from main lab if confirmatory testing is needed.   All other components within normal limits    1. Hypertension   2. Bronchitis     Results for orders placed during the hospital encounter of 12/02/11  POCT URINALYSIS DIP (DEVICE)      Component Value Range   Glucose, UA NEGATIVE  NEGATIVE mg/dL   Bilirubin Urine NEGATIVE  NEGATIVE   Ketones, ur NEGATIVE  NEGATIVE mg/dL   Specific Gravity, Urine 1.025  1.005 - 1.030   Hgb urine dipstick LARGE (*) NEGATIVE   pH 5.5  5.0 - 8.0   Protein, ur NEGATIVE  NEGATIVE mg/dL   Urobilinogen, UA 0.2  0.0 - 1.0 mg/dL   Nitrite NEGATIVE  NEGATIVE   Leukocytes, UA SMALL (*) NEGATIVE  Dg Chest 2 View  12/02/2011  *RADIOLOGY REPORT*  Clinical Data: Left midlung zone rales and  wheezing  CHEST - 2 VIEW  Comparison: 11/30/2011  Findings: Mild enlargement of the cardiomediastinal silhouette with central vascular congestion persists.  No new focal pulmonary opacity.  No pleural effusion. Cholecystectomy clips noted.  No acute osseous finding.  IMPRESSION: Mild cardiomegaly without focal acute abnormality.  Original Report Authenticated By: Harrel Lemon, M.D.     MDM  Previous records reviewed. Blood pressure 168/110 on recent visit of 11/28/2011- had ultrasound guided biopsy, that found intraductal papilloma with necrosis.  Checking CXR as she has focal lung sounds. She is a smoker.  Suspect bronchitis.  EKG and lab work done 2 days ago. EKG shows normal sinus rhythm, rate 68. Left axis deviation.  No hypertrophy. Isolated T wave inversion in aVL. No other ST-T wave changes suggestive of ischemia. No previous EKG for comparison.   CBC, BMET 2 days ago shows normal kidney function, no anemia. Will not repeat labs today.  Udip noted. Patient is currently having vaginal bleeding. No evidence of proteinuria.  Patient has multiple readings indicating stage II hypertension. No evidence of end organ damage. Will start her on ACE inhibitor/hydrochlorothiazide combination, and have her come back in several days for blood pressure recheck. Discussed signs and symptoms that should prompt return to the ER. Patient agrees with plan.  Luiz Blare, MD 12/02/11 2130

## 2011-12-02 NOTE — Discharge Instructions (Signed)
Take 2 puffs from your inhaler as needed for wheezing, cough, shortness of breath. Make sure you finish all the antibiotics, even if you feel better. Start taking the blood pressure medication now, return here in 48 hours for repeat blood pressure check.   Decrease your salt intake. diet and exercise will lower your blood pressure significantly. It is important to keep your blood pressure under good control, as having a elevated for prolonged periods of time significantly increases your risk of stroke, heart attacks, kidney damage, eye damage, and other problems. Return here in a week for blood pressure recheck if you're able to find a primary care physician by then. Return immediately to the ER if you start having chest pain, headache, problems seeing, problems talking, problems walking, if you feel like you're about to pass out, if you do pass out, if you have a seizure, or for any other concerns.Marland Kitchen

## 2011-12-05 ENCOUNTER — Telehealth (INDEPENDENT_AMBULATORY_CARE_PROVIDER_SITE_OTHER): Payer: Self-pay | Admitting: General Surgery

## 2011-12-05 NOTE — Progress Notes (Addendum)
Johney Frame PA reviewed ED notes and asked me to follow up with patient about her BP.   ED note mentions starting her on a BP med and Revonda Standard would like her to take that the morning of surgery.   Revonda Standard also wanted to know if patient was supposed to go back for follow up BP check.  LVM for patient to call [facility]. Put chart back in cabinet for follow up.    Patient returned call at 11:45. She started Lisinopril/HCTZ on 12/02/11 after Urgent Care visit and has been taking daily as ordered. Per note from UC patient was supposed to go back to have BP rechecked but she told me she can not afford to do so. States she has checked it at home-Sunday 12/04/11 it was 127/68 and at 11:50 today 12/05/11 it was 120/70.  Notified patient to take BP med a.m. Of surgery per Shonna Chock PA. Patient stated understanding and she is aware she needs to take BP med daily as ordered. Revonda Standard aware of the above BPs and note. Notified Barbara in triage at [facility] about the above.

## 2011-12-05 NOTE — Telephone Encounter (Signed)
Anesthesia PA needed her to get back on blood pressure med.  She visited [facility] Urgent clinic and was started on Lisinopril/ HCTZ.  Pt is monitoring blood pressure at home and called Darlene with last two pressures 127/68 and 120/70.  FYI.

## 2011-12-06 MED ORDER — CEFAZOLIN SODIUM-DEXTROSE 2-3 GM-% IV SOLR
2.0000 g | INTRAVENOUS | Status: AC
  Administered 2011-12-07: 2 g via INTRAVENOUS
  Filled 2011-12-06: qty 50

## 2011-12-07 ENCOUNTER — Ambulatory Visit (HOSPITAL_COMMUNITY): Payer: Self-pay | Admitting: Vascular Surgery

## 2011-12-07 ENCOUNTER — Encounter (HOSPITAL_COMMUNITY): Payer: Self-pay | Admitting: Surgery

## 2011-12-07 ENCOUNTER — Encounter (HOSPITAL_COMMUNITY): Payer: Self-pay | Admitting: Vascular Surgery

## 2011-12-07 ENCOUNTER — Ambulatory Visit (HOSPITAL_COMMUNITY)
Admission: RE | Admit: 2011-12-07 | Discharge: 2011-12-07 | Disposition: A | Discharge status: Home / Self Care | Payer: Self-pay | Source: Ambulatory Visit | Attending: Surgery | Admitting: Surgery

## 2011-12-07 ENCOUNTER — Ambulatory Visit
Admission: RE | Admit: 2011-12-07 | Discharge: 2011-12-07 | Disposition: A | Discharge status: Home / Self Care | Payer: No Typology Code available for payment source | Source: Ambulatory Visit | Attending: Surgery | Admitting: Surgery

## 2011-12-07 ENCOUNTER — Encounter (HOSPITAL_COMMUNITY)
Admission: RE | Disposition: A | Discharge status: Home / Self Care | Payer: Self-pay | Source: Ambulatory Visit | Attending: Surgery

## 2011-12-07 DIAGNOSIS — F17200 Nicotine dependence, unspecified, uncomplicated: Secondary | ICD-10-CM | POA: Insufficient documentation

## 2011-12-07 DIAGNOSIS — D249 Benign neoplasm of unspecified breast: Secondary | ICD-10-CM | POA: Insufficient documentation

## 2011-12-07 DIAGNOSIS — I10 Essential (primary) hypertension: Secondary | ICD-10-CM | POA: Insufficient documentation

## 2011-12-07 DIAGNOSIS — E785 Hyperlipidemia, unspecified: Secondary | ICD-10-CM | POA: Insufficient documentation

## 2011-12-07 SURGERY — BREAST LUMPECTOMY WITH NEEDLE LOCALIZATION
Anesthesia: General | Site: Breast | Laterality: Left | Wound class: Clean

## 2011-12-07 MED ORDER — HYDROMORPHONE HCL PF 1 MG/ML IJ SOLN
0.2500 mg | INTRAMUSCULAR | Status: DC | PRN
  Administered 2011-12-07 (×3): 0.5 mg via INTRAVENOUS

## 2011-12-07 MED ORDER — OXYCODONE-ACETAMINOPHEN 5-325 MG PO TABS: 1.0000 | ORAL_TABLET | ORAL | Status: AC | PRN

## 2011-12-07 MED ORDER — FENTANYL CITRATE 0.05 MG/ML IJ SOLN
INTRAMUSCULAR | Status: DC | PRN
  Administered 2011-12-07: 150 ug via INTRAVENOUS

## 2011-12-07 MED ORDER — 0.9 % SODIUM CHLORIDE (POUR BTL) OPTIME
TOPICAL | Status: DC | PRN
  Administered 2011-12-07: 1000 mL

## 2011-12-07 MED ORDER — ONDANSETRON HCL 4 MG/2ML IJ SOLN: 4.0000 mg | Freq: Once | INTRAMUSCULAR | Status: DC | PRN

## 2011-12-07 MED ORDER — ONDANSETRON HCL 4 MG/2ML IJ SOLN
4.0000 mg | INTRAMUSCULAR | Status: DC | PRN
  Administered 2011-12-07: 4 mg via INTRAVENOUS

## 2011-12-07 MED ORDER — DROPERIDOL 2.5 MG/ML IJ SOLN
INTRAMUSCULAR | Status: AC
  Filled 2011-12-07: qty 2

## 2011-12-07 MED ORDER — OXYCODONE-ACETAMINOPHEN 5-325 MG PO TABS: 1.0000 | ORAL_TABLET | ORAL | Status: DC | PRN

## 2011-12-07 MED ORDER — LABETALOL HCL 5 MG/ML IV SOLN
INTRAVENOUS | Status: AC
  Filled 2011-12-07: qty 4

## 2011-12-07 MED ORDER — BUPIVACAINE-EPINEPHRINE 0.25% -1:200000 IJ SOLN
INTRAMUSCULAR | Status: DC | PRN
  Administered 2011-12-07: 10 mL

## 2011-12-07 MED ORDER — EPHEDRINE SULFATE 50 MG/ML IJ SOLN
INTRAMUSCULAR | Status: DC | PRN
  Administered 2011-12-07 (×2): 10 mg via INTRAVENOUS
  Administered 2011-12-07: 15 mg via INTRAVENOUS
  Administered 2011-12-07: 10 mg via INTRAVENOUS

## 2011-12-07 MED ORDER — PROPOFOL 10 MG/ML IV BOLUS
INTRAVENOUS | Status: DC | PRN
  Administered 2011-12-07: 200 mg via INTRAVENOUS

## 2011-12-07 MED ORDER — ONDANSETRON HCL 4 MG/2ML IJ SOLN
INTRAMUSCULAR | Status: DC | PRN
  Administered 2011-12-07 (×2): 4 mg via INTRAVENOUS

## 2011-12-07 MED ORDER — ONDANSETRON HCL 4 MG/2ML IJ SOLN
INTRAMUSCULAR | Status: AC
  Filled 2011-12-07: qty 2

## 2011-12-07 MED ORDER — HYDROMORPHONE HCL PF 1 MG/ML IJ SOLN
INTRAMUSCULAR | Status: AC
  Filled 2011-12-07: qty 1

## 2011-12-07 MED ORDER — LACTATED RINGERS IV SOLN
INTRAVENOUS | Status: DC
  Administered 2011-12-07 (×2): via INTRAVENOUS

## 2011-12-07 MED ORDER — NEOSTIGMINE METHYLSULFATE 1 MG/ML IJ SOLN
INTRAMUSCULAR | Status: DC | PRN
  Administered 2011-12-07: 4 mg via INTRAVENOUS

## 2011-12-07 MED ORDER — ROCURONIUM BROMIDE 100 MG/10ML IV SOLN
INTRAVENOUS | Status: DC | PRN
  Administered 2011-12-07: 40 mg via INTRAVENOUS

## 2011-12-07 MED ORDER — BUPIVACAINE-EPINEPHRINE PF 0.25-1:200000 % IJ SOLN
INTRAMUSCULAR | Status: AC
  Filled 2011-12-07: qty 30

## 2011-12-07 MED ORDER — LIDOCAINE HCL (CARDIAC) 20 MG/ML IV SOLN
INTRAVENOUS | Status: DC | PRN
  Administered 2011-12-07: 100 mg via INTRAVENOUS

## 2011-12-07 MED ORDER — MORPHINE SULFATE 2 MG/ML IJ SOLN: 2.0000 mg | INTRAMUSCULAR | Status: DC | PRN

## 2011-12-07 MED ORDER — GLYCOPYRROLATE 0.2 MG/ML IJ SOLN
INTRAMUSCULAR | Status: DC | PRN
  Administered 2011-12-07: .8 mg via INTRAVENOUS

## 2011-12-07 MED ORDER — DIPHENHYDRAMINE HCL 50 MG/ML IJ SOLN: 12.5000 mg | Freq: Four times a day (QID) | INTRAMUSCULAR | Status: DC | PRN

## 2011-12-07 MED ORDER — DROPERIDOL 2.5 MG/ML IJ SOLN
0.6250 mg | Freq: Once | INTRAMUSCULAR | Status: AC
  Administered 2011-12-07: 0.625 mg via INTRAVENOUS

## 2011-12-07 MED ORDER — LABETALOL HCL 5 MG/ML IV SOLN
5.0000 mg | INTRAVENOUS | Status: DC | PRN
  Administered 2011-12-07: 5 mg via INTRAVENOUS

## 2011-12-07 SURGICAL SUPPLY — 52 items
APL SKNCLS STERI-STRIP NONHPOA (GAUZE/BANDAGES/DRESSINGS) ×1
APPLIER CLIP 9.375 MED OPEN (MISCELLANEOUS)
APR CLP MED 9.3 20 MLT OPN (MISCELLANEOUS)
BENZOIN TINCTURE PRP APPL 2/3 (GAUZE/BANDAGES/DRESSINGS) ×2 IMPLANT
BINDER BREAST LRG (GAUZE/BANDAGES/DRESSINGS) IMPLANT
BINDER BREAST XLRG (GAUZE/BANDAGES/DRESSINGS) IMPLANT
BLADE SURG ROTATE 9660 (MISCELLANEOUS) IMPLANT
CHLORAPREP W/TINT 26ML (MISCELLANEOUS) ×2 IMPLANT
CLIP APPLIE 9.375 MED OPEN (MISCELLANEOUS) IMPLANT
CLIP TI WIDE RED SMALL 24 (CLIP) IMPLANT
CLOTH BEACON ORANGE TIMEOUT ST (SAFETY) ×2 IMPLANT
CLSR STERI-STRIP ANTIMIC 1/2X4 (GAUZE/BANDAGES/DRESSINGS) ×1 IMPLANT
CONT SPEC 4OZ CLIKSEAL STRL BL (MISCELLANEOUS) IMPLANT
COVER SURGICAL LIGHT HANDLE (MISCELLANEOUS) ×2 IMPLANT
DECANTER SPIKE VIAL GLASS SM (MISCELLANEOUS) IMPLANT
DEVICE DUBIN SPECIMEN MAMMOGRA (MISCELLANEOUS) IMPLANT
DRAPE LAPAROTOMY TRNSV 102X78 (DRAPE) ×2 IMPLANT
DRAPE UTILITY 15X26 W/TAPE STR (DRAPE) ×4 IMPLANT
DRSG TEGADERM 4X4.75 (GAUZE/BANDAGES/DRESSINGS) ×1 IMPLANT
ELECT BLADE 4.0 EZ CLEAN MEGAD (MISCELLANEOUS) ×2
ELECT REM PT RETURN 9FT ADLT (ELECTROSURGICAL) ×2
ELECTRODE BLDE 4.0 EZ CLN MEGD (MISCELLANEOUS) IMPLANT
ELECTRODE REM PT RTRN 9FT ADLT (ELECTROSURGICAL) ×1 IMPLANT
GAUZE SPONGE 4X4 16PLY XRAY LF (GAUZE/BANDAGES/DRESSINGS) ×2 IMPLANT
GLOVE BIO SURGEON STRL SZ7 (GLOVE) ×2 IMPLANT
GLOVE BIOGEL PI IND STRL 7.0 (GLOVE) IMPLANT
GLOVE BIOGEL PI IND STRL 7.5 (GLOVE) ×1 IMPLANT
GLOVE BIOGEL PI INDICATOR 7.0 (GLOVE) ×1
GLOVE BIOGEL PI INDICATOR 7.5 (GLOVE) ×1
GLOVE ECLIPSE 6.5 STRL STRAW (GLOVE) ×1 IMPLANT
GLOVE ECLIPSE 7.0 STRL STRAW (GLOVE) ×1 IMPLANT
GOWN STRL NON-REIN LRG LVL3 (GOWN DISPOSABLE) ×4 IMPLANT
KIT BASIN OR (CUSTOM PROCEDURE TRAY) ×2 IMPLANT
KIT MARKER MARGIN INK (KITS) ×2 IMPLANT
KIT ROOM TURNOVER OR (KITS) ×2 IMPLANT
NDL HYPO 25GX1X1/2 BEV (NEEDLE) ×1 IMPLANT
NEEDLE HYPO 25GX1X1/2 BEV (NEEDLE) ×2 IMPLANT
NS IRRIG 1000ML POUR BTL (IV SOLUTION) ×2 IMPLANT
PACK GENERAL/GYN (CUSTOM PROCEDURE TRAY) ×2 IMPLANT
PAD ARMBOARD 7.5X6 YLW CONV (MISCELLANEOUS) ×2 IMPLANT
SPECIMEN JAR MEDIUM (MISCELLANEOUS) IMPLANT
SPONGE GAUZE 4X4 12PLY (GAUZE/BANDAGES/DRESSINGS) ×2 IMPLANT
STAPLER VISISTAT 35W (STAPLE) ×2 IMPLANT
STRIP CLOSURE SKIN 1/2X4 (GAUZE/BANDAGES/DRESSINGS) ×2 IMPLANT
SUT MNCRL AB 4-0 PS2 18 (SUTURE) ×2 IMPLANT
SUT VIC AB 3-0 SH 27 (SUTURE) ×2
SUT VIC AB 3-0 SH 27X BRD (SUTURE) ×1 IMPLANT
SYR CONTROL 10ML LL (SYRINGE) ×2 IMPLANT
TOWEL OR 17X24 6PK STRL BLUE (TOWEL DISPOSABLE) ×2 IMPLANT
TOWEL OR 17X26 10 PK STRL BLUE (TOWEL DISPOSABLE) ×1 IMPLANT
TOWEL OR NON WOVEN STRL DISP B (DISPOSABLE) ×2 IMPLANT
WATER STERILE IRR 1000ML POUR (IV SOLUTION) IMPLANT

## 2011-12-07 NOTE — Op Note (Signed)
Preop diagnosis: Left breast intraductal papilloma Postop diagnosis: Same Procedure performed: Left needle localized lumpectomy Surgeon:  Arbor Leer K. Anesthesia:  GETT Indications:  This is a 51 year old female who recently was found to have a mass in the left retroareolar region on mammogram. Biopsy showed a final diagnosis of intraductal papilloma. She presents now for needle localized lumpectomy. A wire was placed this morning by radiology.  Description of procedure: The patient was brought to the operating room and placed in a supine position on the operating room table. After an adequate level of general anesthesia was obtained, the patient's left breast was prepped with ChloraPrep and draped in sterile fashion.A timeout was taken to ensure the proper patient and proper procedure. The wire is placed in a medial to lateral direction heading behind her areola.  We infiltrated the area around the wire with .25 percent Marcaine with epinephrine.We made an elliptical incision around the wire. We raised skin flaps in all directions. I took a cylinder of breast tissue from around the wire heading in a medial to lateral direction behind the areola.  The wire was at a depth of 6.8 cm. We went at least 7 cm and removed the specimen. A specimen mammogram showed that the wire and the biopsy marker were all within the specimen.We inspected carefully for hemostasis. We irrigated thoroughly. The wound was closed with a deep layer of 3-0 Vicryl and a subcuticular layer of 4-0 Monocryl. Steri-Strips and a clean dressing were applied. The patient was then extubated and brought to the recovery room in stable condition. All sponge, instrument, and needle counts are correct  Wilmon Arms. Corliss Skains, MD, [facility]  12/07/2011 11:28 AM

## 2011-12-07 NOTE — Preoperative (Signed)
Beta Blockers   Reason not to administer Beta Blockers:Not Applicable 

## 2011-12-07 NOTE — Anesthesia Procedure Notes (Signed)
Procedure Name: Intubation Date/Time: 12/07/2011 10:12 AM Performed by: Rogelia Boga Pre-anesthesia Checklist: Patient identified, Emergency Drugs available, Suction available, Patient being monitored and Timeout performed Patient Re-evaluated:Patient Re-evaluated prior to inductionOxygen Delivery Method: Circle system utilized Preoxygenation: Pre-oxygenation with 100% oxygen Intubation Type: IV induction Ventilation: Mask ventilation without difficulty and Oral airway inserted - appropriate to patient size Laryngoscope Size: Mac and 4 Grade View: Grade I Tube type: Oral Tube size: 7.5 mm Number of attempts: 1 Airway Equipment and Method: Stylet Placement Confirmation: ETT inserted through vocal cords under direct vision,  positive ETCO2 and breath sounds checked- equal and bilateral Secured at: 21 cm Tube secured with: Tape Dental Injury: Teeth and Oropharynx as per pre-operative assessment

## 2011-12-07 NOTE — Interval H&P Note (Signed)
History and Physical Interval Note:  12/07/2011 8:47 AM  Angela Gardner  has presented today for surgery, with the diagnosis of Inductal papilloma Left breast  The various methods of treatment have been discussed with the patient and family. After consideration of risks, benefits and other options for treatment, the patient has consented to  Procedure(s) (LRB): BREAST LUMPECTOMY WITH NEEDLE LOCALIZATION (Left) as a surgical intervention .  The patient's history has been reviewed, patient examined, no change in status, stable for surgery.  I have reviewed the patients' chart and labs.  Questions were answered to the patient's satisfaction.     Elsworth Ledin K.

## 2011-12-07 NOTE — Discharge Instructions (Signed)
[facility] Office Phone Number [phone]  BREAST BIOPSY/ PARTIAL MASTECTOMY: POST OP INSTRUCTIONS  Always review your discharge instruction sheet given to you by the facility where your surgery was performed.  IF YOU HAVE DISABILITY OR FAMILY LEAVE FORMS, YOU MUST BRING THEM TO THE OFFICE FOR PROCESSING.  DO NOT GIVE THEM TO YOUR DOCTOR.  1. A prescription for pain medication may be given to you upon discharge.  Take your pain medication as prescribed, if needed.  If narcotic pain medicine is not needed, then you may take acetaminophen (Tylenol) or ibuprofen (Advil) as needed. 2. Take your usually prescribed medications unless otherwise directed.  For itching, you may use over-the-counter benadryl. 3. If you need a refill on your pain medication, please contact your pharmacy.  They will contact our office to request authorization.  Prescriptions will not be filled after 5pm or on week-ends. 4. You should eat very light the first 24 hours after surgery, such as soup, crackers, pudding, etc.  Resume your normal diet the day after surgery. 5. Most patients will experience some swelling and bruising in the breast.  Ice packs and a good support bra will help.  Swelling and bruising can take several days to resolve.  6. It is common to experience some constipation if taking pain medication after surgery.  Increasing fluid intake and taking a stool softener will usually help or prevent this problem from occurring.  A mild laxative (Milk of Magnesia or Miralax) should be taken according to package directions if there are no bowel movements after 48 hours. 7. Unless discharge instructions indicate otherwise, you may remove your bandages 24-48 hours after surgery, and you may shower at that time.  You may have steri-strips (small skin tapes) in place directly over the incision.  These strips should be left on the skin for 7-10 days.  If your surgeon used skin glue on the incision, you may  shower in 24 hours.  The glue will flake off over the next 2-3 weeks.  Any sutures or staples will be removed at the office during your follow-up visit. 8. ACTIVITIES:  You may resume regular daily activities (gradually increasing) beginning the next day.  Wearing a good support bra or sports bra minimizes pain and swelling.  You may have sexual intercourse when it is comfortable. a. You may drive when you no longer are taking prescription pain medication, you can comfortably wear a seatbelt, and you can safely maneuver your car and apply brakes. b. RETURN TO WORK:  ______________________________________________________________________________________ 9. You should see your doctor in the office for a follow-up appointment approximately two weeks after your surgery.  Your doctor's nurse will typically make your follow-up appointment when she calls you with your pathology report.  Expect your pathology report 2-3 business days after your surgery.  You may call to check if you do not hear from [place] after three days. 10. OTHER INSTRUCTIONS: _______________________________________________________________________________________________ _____________________________________________________________________________________________________________________________________ _____________________________________________________________________________________________________________________________________ _____________________________________________________________________________________________________________________________________  WHEN TO CALL YOUR DOCTOR: 1. Fever over 101.0 2. Nausea and/or vomiting. 3. Extreme swelling or bruising. 4. Continued bleeding from incision. 5. Increased pain, redness, or drainage from the incision.  The clinic staff is available to answer your questions during regular business hours.  Please don't hesitate to call and ask to speak to one of the nurses for clinical concerns.   If you have a medical emergency, go to the nearest emergency room or call 911.  A surgeon from [facility] is always on call at the hospital.  For further questions, please visit [email or web address]

## 2011-12-07 NOTE — Anesthesia Postprocedure Evaluation (Signed)
  Anesthesia Post-op Note  Patient: Angela Gardner  Procedure(s) Performed: Procedure(s) (LRB): BREAST LUMPECTOMY WITH NEEDLE LOCALIZATION (Left)  Patient Location: PACU  Anesthesia Type: General  Level of Consciousness: awake, oriented, sedated and patient cooperative  Airway and Oxygen Therapy: Patient Spontanous Breathing and Patient connected to nasal cannula oxygen  Post-op Pain: mild  Post-op Assessment: Post-op Vital signs reviewed, Patient's Cardiovascular Status Stable, Respiratory Function Stable, Patent Airway, No signs of Nausea or vomiting and Pain level controlled  Post-op Vital Signs: stable  Complications: No apparent anesthesia complications

## 2011-12-07 NOTE — Progress Notes (Signed)
Dr. Katrinka Blazing notified of patients elevated blood pressure. Dr. Katrinka Blazing aware. Pt instructed that if her blood pressure remained high that she needed to revisit her physician. Pt verbalized understanding. Sister at bedside.

## 2011-12-07 NOTE — Transfer of Care (Signed)
Immediate Anesthesia Transfer of Care Note  Patient: Angela Gardner  Procedure(s) Performed: Procedure(s) (LRB): BREAST LUMPECTOMY WITH NEEDLE LOCALIZATION (Left)  Patient Location: PACU  Anesthesia Type: General  Level of Consciousness: awake, alert , oriented and patient cooperative  Airway & Oxygen Therapy: Patient Spontanous Breathing and Patient connected to nasal cannula oxygen  Post-op Assessment: Report given to PACU RN, Post -op Vital signs reviewed and stable and Patient moving all extremities X 4  Post vital signs: Reviewed and stable  Complications: No apparent anesthesia complications

## 2011-12-07 NOTE — Anesthesia Preprocedure Evaluation (Addendum)
Anesthesia Evaluation  Patient identified by MRN, date of birth, ID band Patient awake    Reviewed: Allergy & Precautions, H&P , NPO status , Patient's Chart, lab work & pertinent test results  Airway Mallampati: II TM Distance: >3 FB Neck ROM: full    Dental  (+) Dental Advisory Given   Pulmonary Current Smoker,          Cardiovascular hypertension, Pt. on medications Rhythm:regular Rate:Normal     Neuro/Psych    GI/Hepatic   Endo/Other  Morbid obesity  Renal/GU      Musculoskeletal   Abdominal (+) + obese,   Peds  Hematology   Anesthesia Other Findings   Reproductive/Obstetrics                         Anesthesia Physical Anesthesia Plan  ASA: II  Anesthesia Plan: General   Post-op Pain Management:    Induction: Intravenous  Airway Management Planned: LMA and Oral ETT  Additional Equipment:   Intra-op Plan:   Post-operative Plan: Extubation in OR  Informed Consent: I have reviewed the patients History and Physical, chart, labs and discussed the procedure including the risks, benefits and alternatives for the proposed anesthesia with the patient or authorized representative who has indicated his/her understanding and acceptance.     Plan Discussed with: CRNA, Anesthesiologist and Surgeon  Anesthesia Plan Comments:         Anesthesia Quick Evaluation

## 2011-12-07 NOTE — H&P (View-Only) (Signed)
Patient ID: Angela Gardner, female   DOB: 03-26-1961, 51 y.o.   MRN: [id number]  Chief Complaint  Patient presents with  . Pre-op Exam    eval Lt br palp    HPI Angela Gardner is a 51 y.o. female.  Referred by Dr. Harmon Pier for evaluation of left intraductal papilloma  HPI 51 yo female who is s/p left lumpectomy in 1999 for "breast cancer".  She apparently had a lumpectomy, followed by radiation, but no lymph node biopsy or chemotherapy.  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  Recently, she had a routine screening mammogram which showed a mass in the left retroareolar region.  This was biopsied and returned a final diagnosis of intraductal papilloma.  The patient reports longstanding pain in the left breast in addition to some pain in her left arm.  No nipple discharge.  Past Medical History  Diagnosis Date  . Hypertension   . Hyperlipemia   . Hx of left breast biopsy   . Anemia     Past Surgical History  Procedure Date  . Cholecystectomy   . Breast biopsy     Family History  Problem Relation Age of Onset  . Anuerysm Mother     Social History History  Substance Use Topics  . Smoking status: Current Everyday Smoker -- 0.3 packs/day for 26 years    Types: Cigarettes  . Smokeless tobacco: Never Used  . Alcohol Use: Yes     social drinking    Allergies  Allergen Reactions  . Vicodin (Hydrocodone-Acetaminophen)   itching  Current Outpatient Prescriptions  Medication Sig Dispense Refill  . NON FORMULARY Blood pressure medication pt not sure what medication      . pravastatin (PRAVACHOL) 20 MG tablet Take 20 mg by mouth daily.        Review of Systems Review of Systems  Constitutional: Negative for fever, chills and unexpected weight change.  HENT: Negative for hearing loss, congestion, sore throat, trouble swallowing and voice change.   Eyes: Negative for visual disturbance.  Respiratory: Negative for cough and wheezing.   Cardiovascular: Negative for chest pain,  palpitations and leg swelling.  Gastrointestinal: Negative for nausea, vomiting, abdominal pain, diarrhea, constipation, blood in stool, abdominal distention and anal bleeding.  Genitourinary: Negative for hematuria, vaginal bleeding and difficulty urinating.  Musculoskeletal: Negative for arthralgias.  Skin: Negative for rash and wound.  Neurological: Negative for seizures, syncope and headaches.  Hematological: Negative for adenopathy. Does not bruise/bleed easily.  Psychiatric/Behavioral: Negative for confusion.    Blood pressure 168/110, pulse 76, temperature 97.1 F (36.2 C), temperature source Temporal, height 5\' 4"  (1.626 m), weight 224 lb 6.4 oz (101.787 kg), SpO2 97.00%.  Physical Exam Physical Exam WDWN in NAD HEENT:  EOMI, sclera anicteric Neck:  No masses, no thyromegaly Lungs:  CTA bilaterally; normal respiratory effort Breasts - symmetric; no palpable masses; no nipple retraction or discharge; no lymphadenopathy Small skin tag in left axilla Healed left circumareolar incision CV:  Regular rate and rhythm; no murmurs Abd:  +bowel sounds, soft, non-tender, no masses Ext:  Well-perfused; no edema Skin:  Warm, dry; no sign of jaundice  Data Reviewed Mammogram/ ultrasound   [place] Core Biopsy   Status: Edited Result - FINAL        Addendum     Rosendo Gros, MD   Caleen Essex Nov 25, 2011  1:33:19 PM EDT       **ADDENDUM** CREATED: 11/25/2011 13:27:04   THE VERBAL REPORT FROM PATHOLOGY OF DUCTAL CARCINOMA (11/24/2011) HAS  BEEN CHANGED FOLLOWING FURTHER EVALUATION AND STAINS.   FINAL WRITTEN SURGICAL PATHOLOGY DEMONSTRATES AN INTRADUCTAL PAPILLOMA WITH ABUNDANT NECROSIS.  SURGICAL EXCISION IS RECOMMENDED.   The patient was contacted on 11/25/2011 and these results explained to her, which she understood.  Her questions were answered.   The patient's existing MRI and [facility] appointments have been cancelled. An appointment with Dr. Harlon Flor [facility]) has  been scheduled for 11/28/2011 and the patient informed.   **END ADDENDUM** SIGNED BY: Tinnie Gens T. Si Gaul, M.D.     Study Result     *RADIOLOGY REPORT*   Clinical Data:  51 year old female with suspicious 5 x 7 x 11 mm irregular mass in the subareolar left breast with adjacent distortion.  History of surgical excision in the subareolar region in 1999 - unknown pathology.   ULTRASOUND GUIDED CORE BIOPSY OF THE LEFT BREAST   Comparison: Recent mamm[facility] and [facility].   I met with the patient and we discussed the procedure of ultrasound- guided biopsy, including benefits and alternatives.  We discussed the high likelihood of a successful procedure. We discussed the risks of the procedure, including infection, bleeding, tissue injury, clip migration, and inadequate sampling.  Informed written consent was given.   Using sterile technique 2% lidocaine, ultrasound guidance and a 14 gauge automated biopsy device, biopsy was performed of 5 x 7 x 11 mm irregular hypoechoic mass in the medial subareolar left breast and adjacent architectural distortion.  At the conclusion of the procedure a tissue marker clip was deployed into the biopsy cavity. Follow up 2 view mammogram was performed and dictated separately.   IMPRESSION: Ultrasound guided biopsy of left breast mass/distortion.  No apparent complications.   Final pathology demonstrates DUCTAL CARCINOMA.  Further evaluation for invasion is pending. Histology correlates with imaging findings.   The patient was contacted by phone on 11/24/2011 and these results given to her which she understood. Her questions were answered. The patient had no complaints with her biopsy site. The patient was encouraged to return to the [facility] for breast cancer educational material.   Recommend surgery - oncology consultation.  An appointment at the [facility] has been scheduled for 11/30/2011 and the patient  informed. Recommend bilateral breast MRI which has been scheduled for 11/29/2011 and the patient informed.   Original Report Authenticated By: Rosendo Gros, M.D.                         Assessment    Intraductal papilloma - left breast.      Plan    Recommend left needle-localized lumpectomy.  The surgical procedure has been discussed with the patient.  Potential risks, benefits, alternative treatments, and expected outcomes have been explained.  All of the patient's questions at this time have been answered.  The likelihood of reaching the patient's treatment goal is good.  The patient understand the proposed surgical procedure and wishes to proceed. She understands that the final pathology might show some invasive carcinoma, for which she might need further surgery.       Dimond Crotty K. 11/28/2011, 11:32 AM

## 2011-12-09 ENCOUNTER — Telehealth (INDEPENDENT_AMBULATORY_CARE_PROVIDER_SITE_OTHER): Payer: Self-pay

## 2011-12-09 NOTE — Telephone Encounter (Signed)
Message copied by Ivory Broad on Fri Dec 09, 2011  3:54 PM ------      Message from: Zacarias Pontes      Created: Fri Dec 09, 2011  3:28 PM       PT WOULD LIKE A CALL BACK WITH BX RESULTS PLEASE

## 2011-12-09 NOTE — Telephone Encounter (Signed)
I called the pt back and informed her that we do not have these yet.  It may be Monday

## 2011-12-12 ENCOUNTER — Telehealth (INDEPENDENT_AMBULATORY_CARE_PROVIDER_SITE_OTHER): Payer: Self-pay | Admitting: General Surgery

## 2011-12-12 NOTE — Telephone Encounter (Signed)
Called pt and she is aware of path results and also to keep appt with Dr Corliss Skains on 12-20-11

## 2011-12-19 ENCOUNTER — Encounter (INDEPENDENT_AMBULATORY_CARE_PROVIDER_SITE_OTHER): Payer: Self-pay | Admitting: Surgery

## 2011-12-20 ENCOUNTER — Ambulatory Visit (INDEPENDENT_AMBULATORY_CARE_PROVIDER_SITE_OTHER): Payer: PRIVATE HEALTH INSURANCE | Admitting: Surgery

## 2011-12-20 ENCOUNTER — Encounter (INDEPENDENT_AMBULATORY_CARE_PROVIDER_SITE_OTHER): Payer: Self-pay | Admitting: Surgery

## 2011-12-20 VITALS — BP 150/100 | HR 85 | Temp 97.2°F | Ht 64.0 in | Wt 221.6 lb

## 2011-12-20 DIAGNOSIS — D249 Benign neoplasm of unspecified breast: Secondary | ICD-10-CM

## 2011-12-20 NOTE — Progress Notes (Signed)
Status post left needle localized lumpectomy on 12/07/11. The pathology showed intraductal papilloma with no sign of malignancy. The patient is healing well. Her incision is well-healed no sign of infection. No erythema or drainage.  The patient should resume annual mammograms. She may follow up with [place] on a p.r.n. Basis.  Wilmon Arms. Corliss Skains, MD, [facility]  12/20/2011 10:46 AM

## 2014-04-21 ENCOUNTER — Encounter (INDEPENDENT_AMBULATORY_CARE_PROVIDER_SITE_OTHER): Payer: Self-pay | Admitting: Surgery

## 2015-05-21 ENCOUNTER — Encounter (HOSPITAL_COMMUNITY): Payer: Self-pay | Admitting: *Deleted

## 2015-05-21 ENCOUNTER — Emergency Department (HOSPITAL_COMMUNITY): Payer: Self-pay

## 2015-05-21 ENCOUNTER — Emergency Department (HOSPITAL_COMMUNITY)
Admission: EM | Admit: 2015-05-21 | Discharge: 2015-05-21 | Disposition: A | Discharge status: Home / Self Care | Payer: Self-pay | Attending: Emergency Medicine | Admitting: Emergency Medicine

## 2015-05-21 DIAGNOSIS — N9489 Other specified conditions associated with female genital organs and menstrual cycle: Secondary | ICD-10-CM

## 2015-05-21 DIAGNOSIS — I158 Other secondary hypertension: Secondary | ICD-10-CM | POA: Insufficient documentation

## 2015-05-21 DIAGNOSIS — R52 Pain, unspecified: Secondary | ICD-10-CM

## 2015-05-21 DIAGNOSIS — F17210 Nicotine dependence, cigarettes, uncomplicated: Secondary | ICD-10-CM | POA: Insufficient documentation

## 2015-05-21 DIAGNOSIS — E669 Obesity, unspecified: Secondary | ICD-10-CM | POA: Insufficient documentation

## 2015-05-21 DIAGNOSIS — F1410 Cocaine abuse, uncomplicated: Secondary | ICD-10-CM | POA: Insufficient documentation

## 2015-05-21 DIAGNOSIS — Z86018 Personal history of other benign neoplasm: Secondary | ICD-10-CM | POA: Insufficient documentation

## 2015-05-21 DIAGNOSIS — Z79899 Other long term (current) drug therapy: Secondary | ICD-10-CM | POA: Insufficient documentation

## 2015-05-21 DIAGNOSIS — Z862 Personal history of diseases of the blood and blood-forming organs and certain disorders involving the immune mechanism: Secondary | ICD-10-CM | POA: Insufficient documentation

## 2015-05-21 DIAGNOSIS — N858 Other specified noninflammatory disorders of uterus: Secondary | ICD-10-CM | POA: Insufficient documentation

## 2015-05-21 LAB — COMPREHENSIVE METABOLIC PANEL
ALK PHOS: 86 U/L (ref 38–126)
ALT: 16 U/L (ref 14–54)
AST: 18 U/L (ref 15–41)
Albumin: 4.1 g/dL (ref 3.5–5.0)
Anion gap: 10 (ref 5–15)
BUN: 12 mg/dL (ref 6–20)
CALCIUM: 8.9 mg/dL (ref 8.9–10.3)
CO2: 26 mmol/L (ref 22–32)
CREATININE: 0.99 mg/dL (ref 0.44–1.00)
Chloride: 101 mmol/L (ref 101–111)
Glucose, Bld: 112 mg/dL — ABNORMAL HIGH (ref 65–99)
Potassium: 3.4 mmol/L — ABNORMAL LOW (ref 3.5–5.1)
Sodium: 137 mmol/L (ref 135–145)
TOTAL PROTEIN: 7.6 g/dL (ref 6.5–8.1)
Total Bilirubin: 0.7 mg/dL (ref 0.3–1.2)

## 2015-05-21 LAB — I-STAT TROPONIN, ED: TROPONIN I, POC: 0.01 ng/mL (ref 0.00–0.08)

## 2015-05-21 LAB — BLOOD GAS, VENOUS
ACID-BASE DEFICIT: 0.1 mmol/L (ref 0.0–2.0)
Bicarbonate: 24 mEq/L (ref 20.0–24.0)
O2 SAT: 59.5 %
PATIENT TEMPERATURE: 98.6
PCO2 VEN: 39.5 mmHg — AB (ref 45.0–50.0)
PH VEN: 7.401 — AB (ref 7.250–7.300)
TCO2: 21.5 mmol/L (ref 0–100)
pO2, Ven: 31 mmHg (ref 30.0–45.0)

## 2015-05-21 LAB — SALICYLATE LEVEL: Salicylate Lvl: <4 mg/dL (ref 2.8–30.0)

## 2015-05-21 LAB — CBC WITH DIFFERENTIAL/PLATELET
Basophils Absolute: 0 10*3/uL (ref 0.0–0.1)
Basophils Relative: 0 %
EOS PCT: 0 %
Eosinophils Absolute: 0 10*3/uL (ref 0.0–0.7)
HCT: 40.4 % (ref 36.0–46.0)
HEMOGLOBIN: 13.6 g/dL (ref 12.0–15.0)
LYMPHS ABS: 1.1 10*3/uL (ref 0.7–4.0)
LYMPHS PCT: 8 %
MCH: 29.4 pg (ref 26.0–34.0)
MCHC: 33.7 g/dL (ref 30.0–36.0)
MCV: 87.3 fL (ref 78.0–100.0)
MONOS PCT: 7 %
Monocytes Absolute: 1 10*3/uL (ref 0.1–1.0)
Neutro Abs: 12.1 10*3/uL — ABNORMAL HIGH (ref 1.7–7.7)
Neutrophils Relative %: 85 %
PLATELETS: 250 10*3/uL (ref 150–400)
RBC: 4.63 MIL/uL (ref 3.87–5.11)
RDW: 14.3 % (ref 11.5–15.5)
WBC: 14.3 10*3/uL — AB (ref 4.0–10.5)

## 2015-05-21 LAB — ETHANOL

## 2015-05-21 LAB — RAPID URINE DRUG SCREEN, HOSP PERFORMED
Amphetamines: NOT DETECTED
Barbiturates: NOT DETECTED
Benzodiazepines: NOT DETECTED
COCAINE: POSITIVE — AB
OPIATES: NOT DETECTED
Tetrahydrocannabinol: NOT DETECTED

## 2015-05-21 LAB — ACETAMINOPHEN LEVEL: Acetaminophen (Tylenol), Serum: <10 ug/mL — ABNORMAL LOW (ref 10–30)

## 2015-05-21 MED ORDER — CLONIDINE HCL 0.2 MG PO TABS: 0.1000 mg | ORAL_TABLET | Freq: Two times a day (BID) | ORAL | Status: DC

## 2015-05-21 MED ORDER — CLONIDINE HCL 0.1 MG PO TABS
0.2000 mg | ORAL_TABLET | Freq: Once | ORAL | Status: AC
  Administered 2015-05-21: 0.2 mg via ORAL
  Filled 2015-05-21: qty 2

## 2015-05-21 MED ORDER — IBUPROFEN 800 MG PO TABS: 800.0000 mg | ORAL_TABLET | Freq: Three times a day (TID) | ORAL | Status: DC

## 2015-05-21 MED ORDER — OXYCODONE-ACETAMINOPHEN 5-325 MG PO TABS: 1.0000 | ORAL_TABLET | Freq: Four times a day (QID) | ORAL | Status: DC | PRN

## 2015-05-21 NOTE — ED Notes (Signed)
Family at bedside.sister

## 2015-05-21 NOTE — ED Notes (Signed)
Pt in ultrasound

## 2015-05-21 NOTE — ED Provider Notes (Addendum)
CSN: [id number]     Arrival date & time 05/21/15  1024 History   First MD Initiated Contact with Patient 05/21/15 1048     Chief Complaint  Patient presents with  . Abdominal Pain  . Vaginal Bleeding     (Consider location/radiation/quality/duration/timing/severity/associated sxs/prior Treatment) HPI   Patient is a 54 year old female brought here by her sister for abdominal pain for the last several days. Patient is very sleepy on exam. She endorses taking cocaine 2 days ago. She reports taking ibuprofen this morning and ibuprofen-14 pills yesterday. Patient said she was doing it to get rid of the pain not for any interest in hurting herself.  Patient reports that she's been bleeding for the last 3 weeks. She reports no periods for 2 years prior. She reports suprapubic pain. Denies any chest pain. Denies any change in discharge. Denies any other drug use aside from the cocaine and ibuprofen.  Past Medical History  Diagnosis Date  . Hyperlipemia   . Hx of left breast biopsy   . Anemia   . Hypertension     not on any medication now-[facility] had  prescribed her a med and she stopped taking them cannot remember when  . 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 Cholecystectomy    . Breast biopsy    . Dilation and curettage of uterus      approx 1 year ago  . Cesarean section      one   Family History  Problem Relation Age of Onset  . Anuerysm Mother    Social History  Substance Use Topics  . Smoking status: Current Every Day Smoker -- 0.30 packs/day for 26 years    Types: Cigarettes  . Smokeless tobacco: Never Used  . Alcohol Use: Yes     Comment: social drinking   OB History    Gravida Para Term Preterm AB TAB SAB Ectopic Multiple Living   3 1 1  2 2    1      Review of Systems  Constitutional: Negative for fever, chills, activity change and fatigue.  HENT: Negative for congestion.   Respiratory: Negative for  shortness of breath.   Cardiovascular: Negative for chest pain.  Gastrointestinal: Positive for abdominal pain. Negative for nausea and vomiting.  Genitourinary: Positive for vaginal bleeding, menstrual problem and pelvic pain. Negative for dysuria and frequency.  Skin: Negative for rash.  Allergic/Immunologic: Negative for immunocompromised state.  Neurological: Negative for dizziness and light-headedness.  Psychiatric/Behavioral: Negative for suicidal ideas and agitation.      Allergies  Vicodin  Home Medications   Prior to Admission medications   Medication Sig Start Date End Date Taking? Authorizing Provider  albuterol (PROVENTIL HFA;VENTOLIN HFA) 108 (90 BASE) MCG/ACT inhaler Inhale 2 puffs into the lungs every 4 (four) hours as needed for wheezing. Dispense with aerochamber 12/02/11 12/01/12  Melynda Ripple, MD  lisinopril-hydrochlorothiazide (PRINZIDE,ZESTORETIC) 10-12.5 MG per tablet Take 1 tablet by mouth daily. 12/02/11 12/01/12  Melynda Ripple, MD   BP 193/105 mmHg  Pulse 72  Temp(Src) 97.4 F (36.3 C) (Oral)  Resp 17  SpO2 97% Physical Exam  Constitutional: She is oriented to person, place, and time. She appears well-developed and well-nourished.  Mildly obese.  HENT:  Head: Normocephalic and atraumatic.  Eyes: Conjunctivae are normal. Right eye exhibits no discharge.  Neck: Neck supple.  Cardiovascular: Normal rate, regular rhythm and normal heart sounds.   No murmur heard.  Pulmonary/Chest: Effort normal and breath sounds normal. She has no wheezes. She has no rales.  Abdominal: Soft. She exhibits no distension. There is no tenderness.  Musculoskeletal: Normal range of motion. She exhibits no edema.  Neurological: She is oriented to person, place, and time. No cranial nerve deficit.  Sleepy but arousable.  Skin: Skin is warm and dry. No rash noted. She is not diaphoretic.  Psychiatric: She has a normal mood and affect. Her behavior is normal.  Nursing note and  vitals reviewed.   ED Course  Procedures (including critical care time) Labs Review Labs Reviewed  WET PREP, GENITAL  CBC WITH DIFFERENTIAL/PLATELET  COMPREHENSIVE METABOLIC PANEL  URINE RAPID DRUG SCREEN, HOSP PERFORMED  SALICYLATE LEVEL  ACETAMINOPHEN LEVEL  ETHANOL  RPR  HIV ANTIBODY (ROUTINE TESTING)  GC/CHLAMYDIA PROBE AMP ([place]) NOT AT [facility]    Imaging Review No results found. I have personally reviewed and evaluated these images and lab results as part of my medical decision-making.   EKG Interpretation None      MDM   Final diagnoses:  None    Patient is a 54 year old female brought in by her sister for abdominal pain for the last several days. Patient reports taking ibuprofen at home. She reports to taking too many ibuprofen spit not as an effort to hurt herself. Patient is very sleepy on exam. She is arousable and able to tell clear story. I suspect the patient is coming down from using cocaine for the last 2 days.  I will complete a vaginal exam once patient wakes up more fully. We'll do transvaginal ultrasound to assess for any pathologic cause of bleeding. We will also get labs including levels of salicylate and acetaminophen. We'll get VBG and drugs abuse screen given the questionable overdose on ibuprofen.  3:33 PM  Patient's vaginal exam : I inserted the speculum and a large amount- roughly 1 L of chocolate colored liquid poured out. No unusual odor noted. Patient felt immediate relief after the speculum opened and released whenever this fluid was.  Patient continues to be very tired after her cocaine use recently.   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 I discussed the patient's care with the women's attending on call. They will arrange follow-up this week. We will also give patient the phone number to call. We will give patient appropriate amounts of pain medication to go home on.   3:56 PM Patient is persistently elevated  blood pressure. Patient sees no primary care physician. I think this is likely been slowly increasing over time. However given her sleepiness we will get CT head to rule out any intracranial issues. I believe that she is likely sleepy from her cocaine binge. We will get ED EKG and troponin as well.  If those as well as her creatinine are normal, it is asymptomatically hypertension and I will discharge home per ACEP guidlelines for asymptomatic htn in the ED.  Sister is at bedside and reprots she is baseline.   Will discharge with follow-up with both women's care and PCP.  Oluwatobi Ruppe Julio Alm, MD 05/21/15 1537  Magin Balbi Julio Alm, MD 05/21/15 1558

## 2015-05-21 NOTE — ED Provider Notes (Signed)
Patient's hypertension treated with clonidine and has improved. Will place on clonidine 0.1 daily and have her follow-up with her Dr.  Lacretia Leigh, MD 05/21/15 [phone]

## 2015-05-21 NOTE — ED Notes (Signed)
Attempted blood draw x1 unsuccessful. 

## 2015-05-21 NOTE — Discharge Instructions (Signed)
You had a mass seen on your ultrasound. You will need a follow-up this week with women's care. Additionally your blood pressure is out of control. We need you to follow up with a primary care physician. We have listed some primary care physicians below. Follow-up in the clinic for your high blood pressure   Emergency Department Resource Guide 1) Find a Doctor and Pay Out of Pocket Although you won't have to find out who is covered by your insurance plan, it is a good idea to ask around and get recommendations. You will then need to call the office and see if the doctor you have chosen will accept you as a new patient and what types of options they offer for patients who are self-pay. Some doctors offer discounts or will set up payment plans for their patients who do not have insurance, but you will need to ask so you aren't surprised when you get to your appointment.  2) Contact Your Local Health Department Not all health departments have doctors that can see patients for sick visits, but many do, so it is worth a call to see if yours does. If you don't know where your local health department is, you can check in your phone book. The CDC also has a tool to help you locate your state's health department, and many state websites also have listings of all of their local health departments.  3) Find a [facility] If your illness is not likely to be very severe or complicated, you may want to try a walk in clinic. These are popping up all over the country in pharmacies, drugstores, and shopping centers. They're usually staffed by nurse practitioners or physician assistants that have been trained to treat common illnesses and complaints. They're usually fairly quick and inexpensive. However, if you have serious medical issues or chronic medical problems, these are probably not your best option.  No Primary Care Doctor: - Call Health Connect at  [phone] - they can help you locate a primary care doctor  that  accepts your insurance, provides certain services, etc. - Physician Referral Service- [phone]  Chronic Pain Problems: Organization         Address  Phone   Notes  [facility]  [phone] Patients need to be referred by their primary care doctor.   Medication Assistance: Organization         Address  Phone   Notes  [facility] Medication [facility] [address] [place], [place] [address] [phone] --Must be a resident of [facility] -- Must have NO insurance coverage whatsoever (no Medicaid/ Medicare, etc.) -- The pt. MUST have a primary care doctor that directs their care regularly and follows them in the community   MedAssist  [phone]   [employer]  [phone]    Agencies that provide inexpensive medical care: Organization         Address  Phone   Notes  [place]  [phone]   [facility]    [phone]   [facility] [place], [place] [address] [phone]   [place] [address] [place] [phone]   Planned Parenthood    ([phone]   [facility]    [phone]   [place] [facility] [place], [place] Phone:  [phone], Fax:  [phone] Hours  of Operation:  9 am - 6 pm, M-F.  Also accepts Medicaid/Medicare and self-pay.  [facility] for [place], [address], [place] Phone: [phone], Fax: [phone]. Hours of Operation:  8:30 am - 5:30 pm, M-F.  Also accepts Medicaid and self-pay.  [facility] [place] [address], [place] Phone: [phone]   [place], [place], [place] [phone], Ext. [phone] Mondays & Thursdays: 7-9 AM.  First 15 patients are seen on a first come, first serve basis.    [place] Providers:  Organization          Address  Phone   Notes  [facility] [address], [place] [phone] Also accepts self-pay patients.  [facility] [address], [place]  [phone]   [place], [address], [place] [phone]   [facility] [address] [place] [phone]   Lucianne Lei [address], [place]   [phone] Only accepts [place] Access [place] patients after they have their name applied to their card.   Self-Pay (no insurance) in [facility]:  Organization         Address  Phone   Notes  Sickle Cell Patients, [facility] Internal [facility] [place] [phone]   [facility] [place] [phone]   [facility] [place]  [place], [place], [place] [phone]   [facility]/Dr. Osei-Bonsu  [address] [place] or [address], [place] [phone] Phone number for both [place] and [place] locations is the same.  Urgent Medical and [facility] [address] [place] [phone]   [facility] [address] [place] or [address] [phone]   [facility] [address] [place] [phone], phone; [phone], fax Sees patients 1st and 3rd Saturday of every month.  Must not qualify for public or private insurance (i.e. Medicaid, Medicare, [place] Health Choice, Veterans' Benefits)  Household income should be no more than 200% of the poverty level The clinic cannot treat you if you are pregnant or think you are pregnant  Sexually transmitted diseases are not treated at the clinic.    Dental Care: Organization         Address  Phone  Notes  [facility] Department of [facility] [place] [phone] Accepts children up to age 5 who are enrolled in [place] or [place]; pregnant women with a Medicaid card; and children who have applied for Medicaid or [place] Health Choice, but were declined, whose parents can pay a reduced fee at time of service.  [facility] Department of [facility]  [address], [place] [phone] Accepts children up to age 65 who are enrolled in [place] or [place]; pregnant women with a Medicaid card; and children who have applied for Medicaid or [place] Health Choice, but were declined, whose parents can pay a reduced fee at time of service.  [place] Adult Dental Access PROGRAM  [place] [phone] Patients are seen by appointment only. Walk-ins are not accepted. [place] will see  patients 61 years of age and older. Monday - Tuesday (8am-5pm) Most Wednesdays (8:30-5pm) $30 per visit, cash only  [facility] Adult Dental Access PROGRAM  [address], [facility] [phone] Patients are seen by appointment only. Walk-ins are not accepted. [place] will see patients 29 years of age and older. One Wednesday Evening (Monthly: Volunteer Based).  $30 per visit, cash only  [place]  [phone] for adults; Children under age 71, call Graduate Pediatric Dentistry at [phone]. Children aged 36-14, please call [phone] to request a pediatric application.  Dental services are provided in all areas of dental care including fillings, crowns and bridges, complete and partial dentures, implants, gum treatment, root canals, and extractions. Preventive care is also provided. Treatment is provided to both adults and children. Patients are selected via a lottery and there is often a waiting list.   [facility] [address], [place]  [phone] [email or web address]   [facility] [address] [place], [place]  [phone], Ext. [phone] Second and Fourth Thursday of each month, opens at 6:30 AM; Clinic ends at 9 AM.  Patients are seen on a first-come first-served basis, and a limited number are seen during each clinic.   [facility]  [address] Hillard Danker [place], [place] [phone]   Eligibility Requirements You must have lived in [place], [place], or [place] counties for at least the last three months.   You cannot be eligible for state or federal sponsored [employer], including [employer], [place], or [employer].   You generally cannot be eligible for healthcare insurance through your employer.    How to apply: Eligibility screenings are held every Tuesday and Wednesday afternoon from 1:00 pm until 4:00 pm. You do not need an appointment for the interview!  [facility] [address] [place], [place]   [place]  [place] Department  [place]  [phone]    Behavioral Health Resources in the Community: Intensive Outpatient Programs Organization         Address  Phone  Notes  Leona Louin. [address] [place], [place] [phone]   [facility] Outpatient [address], [place], [place]   ADS: Alcohol & Drug Svcs [address] [place], [place]   [place] [address]  [place], [place] or [phone]   Substance Abuse Resources Organization         Address  Phone  Notes  Alcohol and Drug Services  [phone]   [place]  [phone]   [facility] [place]   Chinita Pester  [phone]   Residential & Outpatient Substance Abuse Program  [phone]   Psychological Services Organization         Address  Phone  Notes  [facility]  [place]  [phone]    [facility] [address] [place] or [phone]    Mobile Crisis Teams Organization         Address  Phone  Notes  [facility], Mobile Crisis Care Unit  [phone]   [facility]ive Psychotherapeutic Services  [address] [place], [place]   [facility] [address] [place] [phone]    Self-Help/Support Groups Organization  Address  Phone             Notes  [place]. of [place] - variety of support groups  [place] Call for more information  Narcotics Anonymous (NA), [facility] [address], [employer] [place]  2 meetings at this location   Special educational needs teacher         Address  Phone  Notes  [facility]AP Residential Treatment [place],    [place]  [phone]   [facility]  [address] [place] [phone], [place], [place]   [place], [place] [phone] Admissions: 8am-3pm M-F  Incentives Substance [place] [address],    [place], [place] [phone]   [facility] [address] [place], [place], [place]   [facility] [address],  [place], [place]   [facility] - Intensive Outpatient [address], [place], [place]   [facility] [address]  [place], [place] [phone] or [phone]   [facility] (RTS) [address] [place], [place] Accepts Medicaid  [facility] [place] [address]  [place] [phone] Substance Abuse/Addiction Treatment   [facility] Organization         Address  Phone  Notes  [facility]  [phone]   Domenic Schwab, PhD [address] Arlis [place], [place]   [phone] or [phone]   Laurie  [place], [place] [phone]   [facility] [address], [place], [place] [phone] Insurance/Medicaid/sponsorship through [facility] and Families [address] [place]                                    [place], [place] [phone] [place] [address] [place], [place] [phone]    Dr. Adele Schilder  [phone]   [facility] Dept. 1) [address], [place] 2) [place] 3)  [address], [place] [phone]  [phone]   [facility] [phone] or [phone] (After Hours)      Hypertension Hypertension, commonly called high blood pressure, is when the force of blood pumping through your arteries is too strong. Your arteries are the blood vessels that carry blood from your heart throughout your body. A blood pressure reading consists of a higher number over a lower number, such as 110/72. The higher number (systolic) is the pressure inside your arteries when your heart pumps. The lower number (diastolic) is the pressure inside your arteries when your heart relaxes. Ideally you want your blood pressure below 120/80. Hypertension forces your heart to work harder to pump blood. Your arteries may become narrow or stiff. Having untreated or uncontrolled hypertension can cause heart attack, stroke, kidney disease, and other problems. RISK FACTORS Some risk factors for high blood pressure are controllable. Others are not.  Risk factors you cannot control include:   Race. You may be at higher risk if you are African American.  Age. Risk increases with age.  Gender. Men are at higher risk than women before age 4 years. After age 35, women are at  higher risk than men. Risk factors you can control include:  Not getting enough exercise or physical activity.  Being overweight.  Getting too much  fat, sugar, calories, or salt in your diet.  Drinking too much alcohol. SIGNS AND SYMPTOMS Hypertension does not usually cause signs or symptoms. Extremely high blood pressure (hypertensive crisis) may cause headache, anxiety, shortness of breath, and nosebleed. DIAGNOSIS To check if you have hypertension, your health care provider will measure your blood pressure while you are seated, with your arm held at the level of your heart. It should be measured at least twice using the same arm. Certain conditions can cause a difference in blood pressure between your right and left arms. A blood pressure reading that is higher than normal on one occasion does not mean that you need treatment. If it is not clear whether you have high blood pressure, you may be asked to return on a different day to have your blood pressure checked again. Or, you may be asked to monitor your blood pressure at home for 1 or more weeks. TREATMENT Treating high blood pressure includes making lifestyle changes and possibly taking medicine. Living a healthy lifestyle can help lower high blood pressure. You may need to change some of your habits. Lifestyle changes may include:  Following the DASH diet. This diet is high in fruits, vegetables, and whole grains. It is low in salt, red meat, and added sugars.  Keep your sodium intake below 2,300 mg per day.  Getting at least 30-45 minutes of aerobic exercise at least 4 times per week.  Losing weight if necessary.  Not smoking.  Limiting alcoholic beverages.  Learning ways to reduce stress. Your health care provider may prescribe medicine if lifestyle changes are not enough to get your blood pressure under control, and if one of the following is true:  You are 53-72 years of age and your systolic blood pressure is above 140.  You are 69 years of age or older, and your systolic blood pressure is above 150.  Your diastolic blood pressure is above 90.  You have diabetes, and  your systolic blood pressure is over [id number] or your diastolic blood pressure is over 90.  You have kidney disease and your blood pressure is above 140/90.  You have heart disease and your blood pressure is above 140/90. Your personal target blood pressure may vary depending on your medical conditions, your age, and other factors. HOME CARE INSTRUCTIONS  Have your blood pressure rechecked as directed by your health care provider.   Take medicines only as directed by your health care provider. Follow the directions carefully. Blood pressure medicines must be taken as prescribed. The medicine does not work as well when you skip doses. Skipping doses also puts you at risk for problems.  Do not smoke.   Monitor your blood pressure at home as directed by your health care provider. SEEK MEDICAL CARE IF:   You think you are having a reaction to medicines taken.  You have recurrent headaches or feel dizzy.  You have swelling in your ankles.  You have trouble with your vision. SEEK IMMEDIATE MEDICAL CARE IF:  You develop a severe headache or confusion.  You have unusual weakness, numbness, or feel faint.  You have severe chest or abdominal pain.  You vomit repeatedly.  You have trouble breathing. MAKE SURE YOU:   Understand these instructions.  Will watch your condition.  Will get help right away if you are not doing well  or get worse.   This information is not intended to replace advice given to you by your health care provider. Make sure you discuss any questions you have with your health care provider.   Document Released: 06/06/2005 Document Revised: 10/21/2014 Document Reviewed: 03/29/2013 Elsevier Interactive Patient Education [employer].

## 2015-05-21 NOTE — ED Notes (Signed)
GC/Chlamydia and wet prep not obtained during pelvic exam per DR. MacKuen.

## 2015-05-21 NOTE — ED Notes (Signed)
UNABLE  TO GET LAB PATIENT WENT TO XRAY.

## 2015-05-21 NOTE — ED Notes (Addendum)
Patient had been without periods for years and 3 weeks ago she began having vaginal bleeding but states 2 days ago the bleeding stopped. She has been having abdominal pain all along but today it has been worse today. Patient has been taking large amounts of ibuprofen. Patient has been taking 5-200mg  tablets every 2 hours for 2 days. Patient does not have a gynecologist or primary doctor at this time. Patient states she used cocaine 3 days ago but this does not seem to be related.

## 2015-05-22 LAB — HIV ANTIBODY (ROUTINE TESTING W REFLEX): HIV SCREEN 4TH GENERATION: NONREACTIVE

## 2015-05-22 LAB — RPR: RPR Ser Ql: NONREACTIVE

## 2015-06-17 ENCOUNTER — Other Ambulatory Visit (HOSPITAL_COMMUNITY)
Admission: RE | Admit: 2015-06-17 | Discharge: 2015-06-17 | Disposition: A | Discharge status: Home / Self Care | Payer: Self-pay | Source: Ambulatory Visit | Attending: Obstetrics & Gynecology | Admitting: Obstetrics & Gynecology

## 2015-06-17 ENCOUNTER — Encounter (HOSPITAL_COMMUNITY): Payer: Self-pay

## 2015-06-17 ENCOUNTER — Encounter: Payer: Self-pay | Admitting: Obstetrics & Gynecology

## 2015-06-17 ENCOUNTER — Ambulatory Visit (INDEPENDENT_AMBULATORY_CARE_PROVIDER_SITE_OTHER): Payer: Self-pay | Admitting: Obstetrics & Gynecology

## 2015-06-17 ENCOUNTER — Emergency Department (HOSPITAL_COMMUNITY)
Admission: EM | Admit: 2015-06-17 | Discharge: 2015-06-17 | Disposition: A | Discharge status: Home / Self Care | Payer: Self-pay | Attending: Physician Assistant | Admitting: Physician Assistant

## 2015-06-17 VITALS — BP 188/92 | HR 65 | Temp 98.0°F | Ht 64.0 in | Wt 218.0 lb

## 2015-06-17 DIAGNOSIS — N950 Postmenopausal bleeding: Secondary | ICD-10-CM | POA: Insufficient documentation

## 2015-06-17 DIAGNOSIS — N939 Abnormal uterine and vaginal bleeding, unspecified: Secondary | ICD-10-CM

## 2015-06-17 DIAGNOSIS — T465X5A Adverse effect of other antihypertensive drugs, initial encounter: Secondary | ICD-10-CM | POA: Insufficient documentation

## 2015-06-17 DIAGNOSIS — R938 Abnormal findings on diagnostic imaging of other specified body structures: Secondary | ICD-10-CM | POA: Insufficient documentation

## 2015-06-17 DIAGNOSIS — R9389 Abnormal findings on diagnostic imaging of other specified body structures: Secondary | ICD-10-CM

## 2015-06-17 DIAGNOSIS — I10 Essential (primary) hypertension: Secondary | ICD-10-CM | POA: Insufficient documentation

## 2015-06-17 DIAGNOSIS — Z79899 Other long term (current) drug therapy: Secondary | ICD-10-CM | POA: Insufficient documentation

## 2015-06-17 DIAGNOSIS — Z862 Personal history of diseases of the blood and blood-forming organs and certain disorders involving the immune mechanism: Secondary | ICD-10-CM | POA: Insufficient documentation

## 2015-06-17 DIAGNOSIS — Z8639 Personal history of other endocrine, nutritional and metabolic disease: Secondary | ICD-10-CM | POA: Insufficient documentation

## 2015-06-17 DIAGNOSIS — H05223 Edema of bilateral orbit: Secondary | ICD-10-CM | POA: Insufficient documentation

## 2015-06-17 DIAGNOSIS — Z8589 Personal history of malignant neoplasm of other organs and systems: Secondary | ICD-10-CM | POA: Insufficient documentation

## 2015-06-17 DIAGNOSIS — F17210 Nicotine dependence, cigarettes, uncomplicated: Secondary | ICD-10-CM | POA: Insufficient documentation

## 2015-06-17 HISTORY — DX: Postmenopausal bleeding: N95.0

## 2015-06-17 MED ORDER — TRIAMTERENE-HCTZ 37.5-25 MG PO TABS: 1.0000 | ORAL_TABLET | Freq: Every day | ORAL | Status: DC

## 2015-06-17 NOTE — Discharge Instructions (Signed)
Please read and follow all provided instructions.  Your diagnoses today include:  1. Essential hypertension    Your blood pressure was high today (BP): BP 198/100 mmHg   Pulse 70   Temp(Src) 97.9 F (36.6 C) (Oral)   Resp 20   Ht 5\' 4"  (1.626 m)   Wt 98.884 kg   BMI 37.40 kg/m2   SpO2 98%  Tests performed today include:  Vital signs. See below for your results today.   Medications prescribed:   Triamterene/HCTZ - medication for elevated blood pressure  Home care instructions:  Follow any educational materials contained in this packet.  Follow-up instructions: Please follow-up with your primary care provider in the next 7 days for a recheck of your symptoms and blood pressure.    Return instructions:   Please return to the Emergency Department if you experience worsening symptoms.   Return with severe chest pain, abdominal pain, or shortness of breath.   Return with severe headache, focal weakness, numbness, difficulty with speech or vision.  Return with loss of vision or sudden vision change.  Please return if you have any other emergent concerns.  Additional Information:  Your vital signs today were: BP 198/100 mmHg   Pulse 70   Temp(Src) 97.9 F (36.6 C) (Oral)   Resp 20   Ht 5\' 4"  (1.626 m)   Wt 98.884 kg   BMI 37.40 kg/m2   SpO2 98% If your blood pressure (BP) was elevated above 135/85 this visit, please have this repeated by your doctor within one month. --------------

## 2015-06-17 NOTE — ED Notes (Signed)
PT DISCHARGED. INSTRUCTIONS AND PRESCRIPTION GIVEN. AAOX3. PT IN NO APPARENT DISTRESS. THE OPPORTUNITY TO ASK QUESTIONS WAS PROVIDED. 

## 2015-06-17 NOTE — ED Provider Notes (Signed)
History  By signing my name below, I, Angela Gardner, attest that this documentation has been prepared under the direction and in the presence of Josh Zitlaly Malson, PA-C. Electronically Signed: Marlowe Gardner, ED Scribe. 06/17/2015. 3:33 PM.  Chief Complaint  Patient presents with  . Medication Reaction    PT STATES SHE HAS NOT FELT THE SAME SINCE STARTING CLONIDINE 0.1MG  BID SINCE 12/1   The history is provided by the patient and medical records. No language interpreter was used.    HPI Comments:  Angela Gardner is a 54 y.o. female who presents to the Emergency Department complaining of bilateral eye puffiness that began about one month ago since starting Clonidine. She reports associated HA and eye pain. She reports applying some opthalmic drops with moderate relief of the symptoms. She denies modifying factors. She denies visual changes, difficulty breathing, leg swelling or CP. Pt reports she does not have a PCP. She has been on triamterene/HCTZ in the past and tolerated this medication well.  Patient sent from [facility] hospital where she had a endometrial biopsy performed due to endometrial mass.  Past Medical History  Diagnosis Date  . Hyperlipemia   . Hx of left breast biopsy   . Anemia   . Hypertension     not on any medication now-[facility] had  prescribed her a med and she stopped taking them cannot remember when  . 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 Cholecystectomy    . Breast biopsy    . Dilation and curettage of uterus      approx 1 year ago  . Cesarean section      one   Family History  Problem Relation Age of Onset  . Anuerysm Mother    Social History  Substance Use Topics  . Smoking status: Current Every Day Smoker -- 0.30 packs/day for 26 years    Types: Cigarettes  . Smokeless tobacco: Never Used  . Alcohol Use: Yes     Comment: social drinking   OB History    Gravida Para Term Preterm AB TAB  SAB Ectopic Multiple Living   3 1 1  2 2    1      Review of Systems  Constitutional: Negative for fever.  HENT: Negative for rhinorrhea and sore throat.   Eyes: Positive for pain. Negative for redness and visual disturbance.       Bilateral eye puffiness  Respiratory: Negative for cough and shortness of breath.   Cardiovascular: Negative for chest pain and leg swelling.  Gastrointestinal: Negative for nausea, vomiting, abdominal pain and diarrhea.  Genitourinary: Negative for dysuria.  Musculoskeletal: Negative for myalgias.  Skin: Negative for rash.  Neurological: Negative for headaches.    Allergies  Vicodin  Home Medications   Prior to Admission medications   Medication Sig Start Date End Date Taking? Authorizing Provider  Cholecalciferol (VITAMIN D PO) Take by mouth.    Historical Provider, MD  cloNIDine (CATAPRES) 0.2 MG tablet Take 0.5 tablets (0.1 mg total) by mouth 2 (two) times daily. 05/21/15   Lacretia Leigh, MD   Triage Vitals: BP 198/100 mmHg  Pulse 70  Temp(Src) 97.9 F (36.6 C) (Oral)  Resp 20  Ht 5\' 4"  (1.626 m)  Wt 218 lb (98.884 kg)  BMI 37.40 kg/m2  SpO2 98% Physical Exam  Constitutional: She is oriented to person, place, and time. She appears well-developed and well-nourished.  HENT:  Head: Normocephalic and atraumatic.  Eyes: Conjunctivae and EOM are normal. Pupils are equal, round, and reactive to light.  Mild periorbital edema without signs of cellulitis. Small stye noted to the right lower eyelid.  Neck: Normal range of motion.  Cardiovascular: Normal rate.   No murmur heard. Pulmonary/Chest: Effort normal. No respiratory distress. She has no wheezes. She has no rales.  Abdominal: Soft. There is no tenderness.  Musculoskeletal: Normal range of motion.  Neurological: She is alert and oriented to person, place, and time.  Skin: Skin is warm and dry.  Psychiatric: She has a normal mood and affect. Her behavior is normal.  Nursing note and vitals  reviewed.   ED Course  Procedures (including critical care time) DIAGNOSTIC STUDIES: Oxygen Saturation is 98% on RA, normal by my interpretation.   COORDINATION OF CARE: 3:31 PM- Will change BP medication to triamterene/HCTZ and informed pt to discontinue Clonidine. Will give pt resource guide to follow up and establish care with a PCP. Pt verbalizes understanding and agrees to plan.  Medications - No data to display  Vital signs reviewed and are as follows: Filed Vitals:   06/17/15 1522 06/17/15 1556  BP: 198/100 183/100  Pulse: 70 62  Temp: 97.9 F (36.6 C)   Resp: 20    Patient counseled to return if they have weakness in their arms or legs, slurred speech, trouble walking or talking, confusion, trouble with their balance, or if they have any other concerns. Patient verbalizes understanding and agrees with plan.    MDM   Final diagnoses:  Essential hypertension   Patient with uncontrolled high blood pressure. She is having an intolerance to clonidine, without any signs of anaphylaxis. Will switch to triamterene/HCTZ given that she has tolerated this well in the past.    I personally performed the services described in this documentation, which was scribed in my presence. The recorded information has been reviewed and is accurate.    Carlisle Cater, PA-C 06/17/15 [place], MD 06/17/15 2354

## 2015-06-17 NOTE — ED Notes (Addendum)
PT C/O FEELING VERY TIRED, SLEEPINESS, AND HEAD ACHES AFTER TAKING CLONIDINE 0.1MG  BID, WHICH SHE STARTED ON 12/1. PT ALSO STATES SHE HAS NOTICED A CHANGE IN HER EYE SIGHT WITH "PUFFINESS" AND BLURRED VISION.

## 2015-06-17 NOTE — Progress Notes (Signed)
   Subjective:    Patient ID: Angela Gardner, female    DOB: 04/14/1961, 54 y.o.   MRN: [id number]  HPI 54 yo S AA lady here for follow up of PMB. She was seen in the ER with abd pain and PMB and a u/s and CT both showed a very, very thick endometrium.   Review of Systems     Objective:   Physical Exam [id number]  UPT negative, consent signed, time out done Cervix prepped with betadine and grasped with a single tooth tenaculum Uterus sounded to 12 cm Pipelle used for 5 passes with a moderate amount of tissue obtained. She tolerated the procedure well.         Assessment & Plan:  PMB- await EMBX results

## 2015-06-22 ENCOUNTER — Telehealth: Payer: Self-pay | Admitting: General Practice

## 2015-06-22 NOTE — Telephone Encounter (Signed)
Patient called and left message stating she is calling for her test results. Called patient and informed her of negative endo bx. Patient verbalized understanding & had no questions

## 2015-08-19 ENCOUNTER — Encounter: Payer: Self-pay | Admitting: *Deleted

## 2016-06-06 ENCOUNTER — Encounter (HOSPITAL_COMMUNITY): Payer: Self-pay | Admitting: *Deleted

## 2016-06-06 ENCOUNTER — Ambulatory Visit (HOSPITAL_COMMUNITY)
Admission: EM | Admit: 2016-06-06 | Discharge: 2016-06-06 | Disposition: A | Discharge status: Home / Self Care | Payer: Self-pay | Attending: Family Medicine | Admitting: Family Medicine

## 2016-06-06 DIAGNOSIS — L03012 Cellulitis of left finger: Secondary | ICD-10-CM

## 2016-06-06 MED ORDER — POVIDONE-IODINE 10 % EX SOLN
CUTANEOUS | Status: AC
  Filled 2016-06-06: qty 118

## 2016-06-06 MED ORDER — DOXYCYCLINE HYCLATE 100 MG PO CAPS: 100.0000 mg | ORAL_CAPSULE | Freq: Two times a day (BID) | ORAL | 0 refills | Status: DC

## 2016-06-06 NOTE — ED Triage Notes (Signed)
Noticed  Pain  And  Swelling  Base  Of    l  Thumbnail     denys   Any  Injury  Sitting  Upright  On  Exam  The  Thumb is  Tender  To the  Touch

## 2016-06-06 NOTE — ED Provider Notes (Signed)
[place]    CSN: [id number] Arrival date & time: 06/06/16  1914     History   Chief Complaint Chief Complaint  Patient presents with  . Hand Problem    HPI Angela Gardner is a 55 y.o. female.   The history is provided by the patient.  Hand Injury  Location:  Finger Finger location:  L thumb Injury: no   Pain details:    Quality:  Throbbing   Radiates to:  Does not radiate   Severity:  Moderate   Onset quality:  Gradual   Duration:  3 days   Progression:  Worsening Handedness:  Left-handed Dislocation: no   Foreign body present:  No foreign bodies Prior injury to area:  No   Past Medical History:  Diagnosis Date  . Anemia   . Cancer ([place])    left ductal papilloma  . Hx of left breast biopsy   . Hyperlipemia   . Hypertension    not on any medication now-[facility] had  prescribed her a med and she stopped taking them cannot remember when    Patient Active Problem List   Diagnosis Date Noted  . PMB (postmenopausal bleeding) 06/17/2015  . 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 OTITIS MEDIA 12/04/2008  . ACUTE BRONCHITIS 12/04/2008    Past Surgical History:  Procedure Laterality Date  . BREAST BIOPSY    . CESAREAN SECTION     one  . CHOLECYSTECTOMY    . DILATION AND CURETTAGE OF UTERUS     approx 1 year ago    OB History    Gravida Para Term Preterm AB Living   3 1 1   2 1    SAB TAB Ectopic Multiple Live Births     2             Home Medications    Prior to Admission medications   Medication Sig Start Date End Date Taking? Authorizing Provider  Cholecalciferol (VITAMIN D PO) Take by mouth.    Historical Provider, MD  triamterene-hydrochlorothiazide (MAXZIDE-25) 37.5-25 MG tablet Take 1 tablet by mouth daily. 06/17/15   Carlisle Cater, PA-C    Family History Family History  Problem Relation Age of Onset  . Anuerysm Mother     Social History Social History  Substance Use Topics  . Smoking status: Current  Every Day Smoker    Packs/day: 0.30    Years: 26.00    Types: Cigarettes  . Smokeless tobacco: Never Used  . Alcohol use Yes     Comment: social drinking     Allergies   Vicodin [hydrocodone-acetaminophen]   Review of Systems Review of Systems  Constitutional: Negative.   Musculoskeletal: Negative.   Skin: Positive for wound.  All other systems reviewed and are negative.    Physical Exam Triage Vital Signs ED Triage Vitals  Enc Vitals Group     BP 06/06/16 2017 165/91     Pulse Rate 06/06/16 2017 82     Resp 06/06/16 2017 18     Temp 06/06/16 2017 99.8 F (37.7 C)     Temp Source 06/06/16 2017 Oral     SpO2 06/06/16 2017 100 %     Weight --      Height --      Head Circumference --      Peak Flow --      Pain Score 06/06/16 2016 10     Pain Loc --      Pain Edu? --  Excl. in GC? --    No data found.   Updated Vital Signs BP 165/91 (BP Location: Right Arm)   Pulse 82   Temp 99.8 F (37.7 C) (Oral)   Resp 18   SpO2 100%   Visual Acuity Right Eye Distance:   Left Eye Distance:   Bilateral Distance:    Right Eye Near:   Left Eye Near:    Bilateral Near:     Physical Exam  Constitutional: She is oriented to person, place, and time. She appears well-developed and well-nourished. She appears distressed.  Musculoskeletal: She exhibits tenderness.  Cuticle sts at base of thumb nail  Neurological: She is alert and oriented to person, place, and time.  Skin: Skin is warm. There is erythema.  Nursing note and vitals reviewed.    UC Treatments / Results  Labs (all labs ordered are listed, but only abnormal results are displayed) Labs Reviewed - No data to display  EKG  EKG Interpretation None       Radiology No results found.  Procedures .Marland KitchenIncision and Drainage Date/Time: 06/06/2016 8:39 PM Performed by: Billy Fischer Authorized by: Ihor Gully D   Location:    Type:  Abscess   Location:  Upper extremity   Upper extremity  location:  Finger   Finger location:  L thumb Pre-procedure details:    Skin preparation:  Hibiclens Anesthesia (see MAR for exact dosages):    Anesthesia method:  Topical application Procedure type:    Complexity:  Simple Procedure details:    Incision types:  Single straight   Scalpel blade:  11   Drainage:  Purulent   Drainage amount:  Moderate   Wound treatment:  Wound left open Post-procedure details:    Patient tolerance of procedure:  Tolerated well, no immediate complications    (including critical care time)  Medications Ordered in UC Medications - No data to display   Initial Impression / Assessment and Plan / UC Course  I have reviewed the triage vital signs and the nursing notes.  Pertinent labs & imaging results that were available during my care of the patient were reviewed by me and considered in my medical decision making (see chart for details).  Clinical Course      Final Clinical Impressions(s) / UC Diagnoses   Final diagnoses:  None    New Prescriptions New Prescriptions   No medications on file     Billy Fischer, MD 06/07/16 2054

## 2016-06-06 NOTE — Discharge Instructions (Signed)
Soak twice a day for 5 days in warm water, take all of medicine, return if any problems. °

## 2017-05-15 ENCOUNTER — Encounter (HOSPITAL_COMMUNITY): Payer: Self-pay

## 2017-09-12 ENCOUNTER — Observation Stay (HOSPITAL_COMMUNITY): Payer: PRIVATE HEALTH INSURANCE

## 2017-09-12 ENCOUNTER — Inpatient Hospital Stay (HOSPITAL_COMMUNITY)
Admission: EM | Admit: 2017-09-12 | Discharge: 2017-09-16 | DRG: 292 | Disposition: A | Discharge status: Home / Self Care | Payer: PRIVATE HEALTH INSURANCE | Attending: Internal Medicine | Admitting: Internal Medicine

## 2017-09-12 ENCOUNTER — Encounter (HOSPITAL_COMMUNITY): Payer: Self-pay | Admitting: Family Medicine

## 2017-09-12 ENCOUNTER — Emergency Department (HOSPITAL_COMMUNITY): Payer: PRIVATE HEALTH INSURANCE

## 2017-09-12 DIAGNOSIS — I5033 Acute on chronic diastolic (congestive) heart failure: Secondary | ICD-10-CM | POA: Diagnosis present

## 2017-09-12 DIAGNOSIS — I161 Hypertensive emergency: Secondary | ICD-10-CM

## 2017-09-12 DIAGNOSIS — N289 Disorder of kidney and ureter, unspecified: Secondary | ICD-10-CM

## 2017-09-12 DIAGNOSIS — I110 Hypertensive heart disease with heart failure: Principal | ICD-10-CM | POA: Diagnosis present

## 2017-09-12 DIAGNOSIS — Z79899 Other long term (current) drug therapy: Secondary | ICD-10-CM

## 2017-09-12 DIAGNOSIS — F17210 Nicotine dependence, cigarettes, uncomplicated: Secondary | ICD-10-CM | POA: Diagnosis present

## 2017-09-12 DIAGNOSIS — I169 Hypertensive crisis, unspecified: Secondary | ICD-10-CM | POA: Diagnosis not present

## 2017-09-12 DIAGNOSIS — N939 Abnormal uterine and vaginal bleeding, unspecified: Secondary | ICD-10-CM

## 2017-09-12 DIAGNOSIS — R0789 Other chest pain: Secondary | ICD-10-CM | POA: Diagnosis present

## 2017-09-12 DIAGNOSIS — N938 Other specified abnormal uterine and vaginal bleeding: Secondary | ICD-10-CM | POA: Diagnosis present

## 2017-09-12 DIAGNOSIS — I509 Heart failure, unspecified: Secondary | ICD-10-CM

## 2017-09-12 DIAGNOSIS — N179 Acute kidney failure, unspecified: Secondary | ICD-10-CM | POA: Diagnosis present

## 2017-09-12 DIAGNOSIS — Z9114 Patient's other noncompliance with medication regimen: Secondary | ICD-10-CM

## 2017-09-12 DIAGNOSIS — E785 Hyperlipidemia, unspecified: Secondary | ICD-10-CM | POA: Diagnosis present

## 2017-09-12 DIAGNOSIS — N920 Excessive and frequent menstruation with regular cycle: Secondary | ICD-10-CM

## 2017-09-12 DIAGNOSIS — D252 Subserosal leiomyoma of uterus: Secondary | ICD-10-CM | POA: Diagnosis present

## 2017-09-12 DIAGNOSIS — T502X5A Adverse effect of carbonic-anhydrase inhibitors, benzothiadiazides and other diuretics, initial encounter: Secondary | ICD-10-CM | POA: Diagnosis present

## 2017-09-12 HISTORY — DX: Disorder of kidney and ureter, unspecified: N28.9

## 2017-09-12 HISTORY — DX: Other chest pain: R07.89

## 2017-09-12 HISTORY — DX: Heart failure, unspecified: I50.9

## 2017-09-12 HISTORY — DX: Hypertensive crisis, unspecified: I16.9

## 2017-09-12 HISTORY — DX: Other specified abnormal uterine and vaginal bleeding: N93.8

## 2017-09-12 LAB — BASIC METABOLIC PANEL
Anion gap: 10 (ref 5–15)
BUN: 14 mg/dL (ref 6–20)
CALCIUM: 9.2 mg/dL (ref 8.9–10.3)
CO2: 23 mmol/L (ref 22–32)
CREATININE: 1.31 mg/dL — AB (ref 0.44–1.00)
Chloride: 108 mmol/L (ref 101–111)
GFR calc non Af Amer: 45 mL/min — ABNORMAL LOW (ref >60)
GFR, EST AFRICAN AMERICAN: 52 mL/min — AB (ref >60)
Glucose, Bld: 117 mg/dL — ABNORMAL HIGH (ref 65–99)
Potassium: 3.5 mmol/L (ref 3.5–5.1)
Sodium: 141 mmol/L (ref 135–145)

## 2017-09-12 LAB — I-STAT TROPONIN, ED: TROPONIN I, POC: 0.03 ng/mL (ref 0.00–0.08)

## 2017-09-12 LAB — I-STAT BETA HCG BLOOD, ED (MC, WL, AP ONLY): I-stat hCG, quantitative: <5 m[IU]/mL (ref <5)

## 2017-09-12 LAB — CBC
HCT: 39.5 % (ref 36.0–46.0)
Hemoglobin: 12.8 g/dL (ref 12.0–15.0)
MCH: 29 pg (ref 26.0–34.0)
MCHC: 32.4 g/dL (ref 30.0–36.0)
MCV: 89.4 fL (ref 78.0–100.0)
Platelets: 278 10*3/uL (ref 150–400)
RBC: 4.42 MIL/uL (ref 3.87–5.11)
RDW: 15.3 % (ref 11.5–15.5)
WBC: 5.6 10*3/uL (ref 4.0–10.5)

## 2017-09-12 LAB — BRAIN NATRIURETIC PEPTIDE: B Natriuretic Peptide: 258.2 pg/mL — ABNORMAL HIGH (ref 0.0–100.0)

## 2017-09-12 MED ORDER — FUROSEMIDE 10 MG/ML IJ SOLN
40.0000 mg | Freq: Two times a day (BID) | INTRAMUSCULAR | Status: DC
  Administered 2017-09-13 – 2017-09-14 (×3): 40 mg via INTRAVENOUS
  Filled 2017-09-12 (×3): qty 4

## 2017-09-12 MED ORDER — IOPAMIDOL (ISOVUE-370) INJECTION 76%
INTRAVENOUS | Status: AC
  Administered 2017-09-12: 100 mL
  Filled 2017-09-12: qty 100

## 2017-09-12 MED ORDER — NITROGLYCERIN 0.4 MG SL SUBL: 0.4000 mg | SUBLINGUAL_TABLET | SUBLINGUAL | Status: DC | PRN

## 2017-09-12 MED ORDER — METOPROLOL TARTRATE 25 MG PO TABS
25.0000 mg | ORAL_TABLET | Freq: Two times a day (BID) | ORAL | Status: DC
  Administered 2017-09-12 – 2017-09-16 (×7): 25 mg via ORAL
  Filled 2017-09-12 (×8): qty 1

## 2017-09-12 MED ORDER — ACETAMINOPHEN 325 MG PO TABS
650.0000 mg | ORAL_TABLET | ORAL | Status: DC | PRN
  Administered 2017-09-13: 650 mg via ORAL
  Filled 2017-09-12: qty 2

## 2017-09-12 MED ORDER — NITROGLYCERIN IN D5W 200-5 MCG/ML-% IV SOLN
0.0000 ug/min | Freq: Once | INTRAVENOUS | Status: AC
  Administered 2017-09-12: 50 ug/min via INTRAVENOUS

## 2017-09-12 MED ORDER — ONDANSETRON HCL 4 MG/2ML IJ SOLN: 4.0000 mg | Freq: Four times a day (QID) | INTRAMUSCULAR | Status: DC | PRN

## 2017-09-12 MED ORDER — NITROGLYCERIN IN D5W 200-5 MCG/ML-% IV SOLN
0.0000 ug/min | Freq: Once | INTRAVENOUS | Status: DC
  Administered 2017-09-12: 50 ug/min via INTRAVENOUS
  Filled 2017-09-12: qty 250

## 2017-09-12 MED ORDER — ALPRAZOLAM 0.25 MG PO TABS
0.2500 mg | ORAL_TABLET | Freq: Two times a day (BID) | ORAL | Status: DC | PRN
  Administered 2017-09-12: 0.25 mg via ORAL
  Filled 2017-09-12: qty 1

## 2017-09-12 MED ORDER — NITROGLYCERIN IN D5W 200-5 MCG/ML-% IV SOLN
0.0000 ug/min | Freq: Once | INTRAVENOUS | Status: DC
  Filled 2017-09-12: qty 250

## 2017-09-12 MED ORDER — FUROSEMIDE 10 MG/ML IJ SOLN
40.0000 mg | Freq: Once | INTRAMUSCULAR | Status: AC
  Administered 2017-09-12: 40 mg via INTRAVENOUS
  Filled 2017-09-12: qty 4

## 2017-09-12 NOTE — ED Notes (Signed)
Patient transported to CT scan . 

## 2017-09-12 NOTE — ED Provider Notes (Addendum)
[facility] EMERGENCY DEPARTMENT Provider Note   CSN: [id number] Arrival date & time: 09/12/17  1414     History   Chief Complaint Chief Complaint  Patient presents with  . Shortness of Breath  . Vaginal Bleeding    HPI Angela Gardner is a 57 y.o. female with history of uncontrolled hypertension, breast cancer who presents with a several week history of worsening shortness of breath on exertion.  She has had associated chest pain that she describes as sharp.  She reports she is felt like she is wheezing.  She is also had vaginal bleeding constant for 6 months.  She has been passing large clots.  She was seen in January 2017 by an OB/GYN who did an endometrial biopsy which was negative.  She is not followed up since then.  She had stopped bleeding until 6 months ago.  She has had associated abdominal cramping with this.  She has been taking several over-the-counter pain medications without relief.  Patient reports she has been taking her sister's blood pressure medication up until 2-3 weeks ago.  She does not have insurance.  She is noticed associated lower extremity swelling.  She has had intermittent headaches and vision changes for "years."  She also reports pain in her right lower leg that has been going on for years as well.  HPI  Past Medical History:  Diagnosis Date  . Anemia   . Cancer ([place])    left ductal papilloma  . Hx of left breast biopsy   . Hyperlipemia   . Hypertension    not on any medication now-[facility] had  prescribed her a med and she stopped taking them cannot remember when    Patient Active Problem List   Diagnosis Date Noted  . Hypertensive crisis 09/12/2017  . Mild renal insufficiency 09/12/2017  . Dysfunctional uterine bleeding 09/12/2017  . Acute CHF (congestive heart failure) ([place]) 09/12/2017  . Chest discomfort 09/12/2017  . PMB (postmenopausal bleeding) 06/17/2015  . 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 OTITIS  MEDIA 12/04/2008  . ACUTE BRONCHITIS 12/04/2008    Past Surgical History:  Procedure Laterality Date  . BREAST BIOPSY    . CESAREAN SECTION     one  . CHOLECYSTECTOMY    . DILATION AND CURETTAGE OF UTERUS     approx 1 year ago     OB History    Gravida  3   Para  1   Term  1   Preterm      AB  2   Living  1     SAB      TAB  2   Ectopic      Multiple      Live Births               Home Medications    Prior to Admission medications   Medication Sig Start Date End Date Taking? Authorizing Provider  ibuprofen (ADVIL,MOTRIN) 200 MG tablet Take 600 mg by mouth every 6 (six) hours as needed for mild pain.   Yes [provider]  cloNIDine (CATAPRES) 0.2 MG tablet Take 0.5 tablets (0.1 mg total) by mouth 2 (two) times daily. 05/21/15 06/17/15  Lacretia Leigh, MD    Family History Family History  Problem Relation Age of Onset  . Anuerysm Mother     Social History Social History   Tobacco Use  . Smoking status: Current Every Day Smoker    Packs/day: 0.30  Years: 26.00    Pack years: 7.80    Types: Cigarettes  . Smokeless tobacco: Never Used  Substance Use Topics  . Alcohol use: Yes    Comment: social drinking  . Drug use: Yes    Types: Cocaine     Allergies   Vicodin [hydrocodone-acetaminophen]   Review of Systems Review of Systems  Constitutional: Negative for chills and fever.  HENT: Negative for facial swelling and sore throat.   Respiratory: Positive for shortness of breath.   Cardiovascular: Positive for chest pain.  Gastrointestinal: Positive for abdominal pain (cramping), nausea and vomiting.  Genitourinary: Positive for vaginal bleeding. Negative for dysuria.  Musculoskeletal: Negative for back pain.  Skin: Negative for rash and wound.  Neurological: Negative for headaches.  Psychiatric/Behavioral: The patient is not nervous/anxious.      Physical Exam Updated Vital Signs BP (!) 174/111   Pulse 76   Temp 98.5  F (36.9 C) (Oral)   Resp (!) 26   SpO2 99%   Physical Exam  Constitutional: She appears well-developed and well-nourished. No distress.  HENT:  Head: Normocephalic and atraumatic.  Mouth/Throat: Oropharynx is clear and moist. No oropharyngeal exudate.  Eyes: Pupils are equal, round, and reactive to light. Conjunctivae are normal. Right eye exhibits no discharge. Left eye exhibits no discharge. No scleral icterus.  Neck: Normal range of motion. Neck supple. No thyromegaly present.  Cardiovascular: Normal rate, regular rhythm, normal heart sounds and intact distal pulses. Exam reveals no gallop and no friction rub.  No murmur heard. Pulmonary/Chest: Effort normal. No stridor. No respiratory distress. She has no wheezes. She has rales (diffuse bilaterally, worse in the bases).  Abdominal: Soft. Bowel sounds are normal. She exhibits no distension. There is no tenderness. There is no rebound and no guarding.  Musculoskeletal:       Right lower leg: She exhibits edema (1+).       Left lower leg: She exhibits edema (1+).  Lymphadenopathy:    She has no cervical adenopathy.  Neurological: She is alert. Coordination normal.  Skin: Skin is warm and dry. No rash noted. She is not diaphoretic. No pallor.  Psychiatric: She has a normal mood and affect.  Nursing note and vitals reviewed.    ED Treatments / Results  Labs (all labs ordered are listed, but only abnormal results are displayed) Labs Reviewed  BASIC METABOLIC PANEL - Abnormal; Notable for the following components:      Result Value   Glucose, Bld 117 (*)    Creatinine, Ser 1.31 (*)    GFR calc non Af Amer 45 (*)    GFR calc Af Amer 52 (*)    All other components within normal limits  BRAIN NATRIURETIC PEPTIDE - Abnormal; Notable for the following components:   B Natriuretic Peptide 258.2 (*)    All other components within normal limits  CBC  HIV ANTIBODY (ROUTINE TESTING)  TROPONIN I  TROPONIN I  TROPONIN I  BASIC  METABOLIC PANEL  CBC  RAPID URINE DRUG SCREEN, HOSP PERFORMED  I-STAT TROPONIN, ED  I-STAT BETA HCG BLOOD, ED ([facility], [facility], [facility] ONLY)    EKG EKG Interpretation  Date/Time:  Tuesday September 12 2017 14:18:43 EDT Ventricular Rate:  78 PR Interval:  136 QRS Duration: 84 QT Interval:  430 QTC Calculation: 490 R Axis:   -28 Text Interpretation:  Normal sinus rhythm Right atrial enlargement T wave abnormality, consider lateral ischemia Abnormal ECG no significant change since Dec 2016 Confirmed by Sherwood Gambler [phone])  on 09/12/2017 8:15:25 PM   Radiology Dg Chest 2 View  Result Date: 09/12/2017 CLINICAL DATA:  Shortness of breath. Heaviness in chest. History of left breast mass. EXAM: CHEST - 2 VIEW COMPARISON:  12/02/2011. FINDINGS: Mediastinum hilar structures normal. Cardiomegaly. Mild pulmonary venous congestion. Mild bilateral interstitial prominence. No focal infiltrate. No pleural effusion or pneumothorax. Degenerative change thoracic spine. Surgical clips right upper abdomen. IMPRESSION: Cardiomegaly. Mild pulmonary venous congestion. Mild bilateral pulmonary interstitial prominence. Mild CHF may be present. Electronically Signed   By: Marcello Moores  Register   On: 09/12/2017 15:11    Procedures Procedures (including critical care time)  CRITICAL CARE Performed by: Frederica Kuster   Total critical care time: 30 minutes  Critical care time was exclusive of separately billable procedures and treating other patients.  Critical care was necessary to treat or prevent imminent or life-threatening deterioration.  Critical care was time spent personally by me on the following activities: development of treatment plan with patient and/or surrogate as well as nursing, discussions with consultants, evaluation of patient's response to treatment, examination of patient, obtaining history from patient or surrogate, ordering and performing treatments and interventions, ordering and review of laboratory  studies, ordering and review of radiographic studies, pulse oximetry and re-evaluation of patient's condition.   Medications Ordered in ED Medications  acetaminophen (TYLENOL) tablet 650 mg (has no administration in time range)  ondansetron (ZOFRAN) injection 4 mg (has no administration in time range)  ALPRAZolam (XANAX) tablet 0.25 mg (0.25 mg Oral Given 09/12/17 2328)  furosemide (LASIX) injection 40 mg (has no administration in time range)  metoprolol tartrate (LOPRESSOR) tablet 25 mg (25 mg Oral Given 09/12/17 2328)  nitroGLYCERIN 50 mg in dextrose 5 % 250 mL (0.2 mg/mL) infusion (has no administration in time range)  nitroGLYCERIN 50 mg in dextrose 5 % 250 mL (0.2 mg/mL) infusion (60 mcg/min Intravenous Rate/Dose Change 09/12/17 2322)  furosemide (LASIX) injection 40 mg (40 mg Intravenous Given 09/12/17 2117)  iopamidol (ISOVUE-370) 76 % injection (100 mLs  Contrast Given 09/12/17 2330)     Initial Impression / Assessment and Plan / ED Course  I have reviewed the triage vital signs and the nursing notes.  Pertinent labs & imaging results that were available during my care of the patient were reviewed by me and considered in my medical decision making (see chart for details).     Patient with hypertensive emergency and acute onset CHF.  Troponin negative.  Patient does have elevated creatinine, concerning for end-organ damage.  Chest x-ray shows cardiomegaly, mild pulmonary venous congestion, mild bilateral pulmonary interstitial prominence, mild CHF may be present.  Patient with blood pressures up to 517 systolic.  Nitroglycerin drip initiated, titrated to 174/111 to a MAP of 130.  Patient also with 6 months of vaginal bleeding, hemoglobin 12.8.  Patient has been previously worked up for this, but has not followed up to OB/GYN.  She was found to have uterine fibroids and increased endometrial thickening.  She did have a negative endometrial biopsy.  Patient to follow-up with OB/GYN, as  patient is hypertensive emergency and CHF are the more pressing issue at this time with a stable hemoglobin and also hesitant to do a pelvic exam and make the patient lie flat with her pulmonary edema.  I consulted hospitalist and spoke with Dr. Myna Hidalgo who will admit the patient for further evaluation and treatment. I appreciate his assistance.  Patient also evaluated by Dr. Regenia Skeeter who guided the patient's management and agrees with plan.  Final Clinical Impressions(s) / ED Diagnoses   Final diagnoses:  Hypertensive emergency  Acute congestive heart failure, unspecified heart failure type [facility])  Vaginal bleeding    ED Discharge Orders    None           Frederica Kuster, PA-C 09/13/17 0010    Sherwood Gambler, MD 09/15/17 1213

## 2017-09-12 NOTE — H&P (Signed)
History and Physical    Angela Gardner GNF:[id number] DOB: Aug 19, 1960 DOA: 09/12/2017  PCP: System, Pcp Not In   Patient coming from: Home  Chief Complaint: DOE   HPI: Angela Gardner is a 57 y.o. female with medical history significant for hypertension and dysfunctional uterine bleeding, now presenting to the emergency department for shortness of breath, chest discomfort, and bilateral leg edema.  Patient reports that she stopped her antihypertensives because she did not like the way they made her feel.  Over the past month or more, she has noted the insidious development of exertional dyspnea and bilateral leg swelling.  This has progressively worsened and she also reports chest discomfort, in addition to "pain all over."  She denies change in vision or hearing, and denies focal numbness or weakness.  Denies cough, fevers, or chills.  Reports that her shortness of breath is worse with exertion, but is unable to identify any other alleviating or exacerbating factors for her dyspnea or chest discomfort.  ED Course: Upon arrival to the ED, patient is found to be afebrile, saturating well on room air, and with blood pressure as high as 260/136.  EKG features a sinus rhythm with right atrial enlargement and inferolateral T wave abnormality.  Chest x-ray is notable for cardiomegaly with mild pulmonary vascular congestion, mild bilateral interstitial prominence, likely reflecting CHF.  Chemistry panel features a creatinine of 1.31, up from 0.99 in 2016.  CBC is unremarkable.  Initial troponin is normal and BNP is elevated to 258.  Patient was given 40 mg of IV Lasix and started on nitroglycerin infusion in the ED.  She will be admitted to the stepdown unit for ongoing evaluation and management of hypertensive crisis with acute CHF.  Review of Systems:  All other systems reviewed and apart from HPI, are negative.  Past Medical History:  Diagnosis Date  . Anemia   . 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 Hx of left breast biopsy   . Hyperlipemia   . Hypertension    not on any medication now-[facility] had  prescribed her a med and she stopped taking them cannot remember when    Past Surgical History:  Procedure Laterality Date  . BREAST BIOPSY    . CESAREAN SECTION     one  . CHOLECYSTECTOMY    . DILATION AND CURETTAGE OF UTERUS     approx 1 year ago     reports that she has been smoking cigarettes.  She has a 7.80 pack-year smoking history. She has never used smokeless tobacco. She reports that she drinks alcohol. She reports that she has current or past drug history. Drug: Cocaine.  Allergies  Allergen Reactions  . Vicodin [Hydrocodone-Acetaminophen] Itching    Family History  Problem Relation Age of Onset  . Anuerysm Mother      Prior to Admission medications   Medication Sig Start Date End Date Taking? Authorizing Provider  Cholecalciferol (VITAMIN D PO) Take by mouth.    [provider]  doxycycline (VIBRAMYCIN) 100 MG capsule Take 1 capsule (100 mg total) by mouth 2 (two) times daily. 06/06/16   Billy Fischer, MD  triamterene-hydrochlorothiazide (MAXZIDE-25) 37.5-25 MG tablet Take 1 tablet by mouth daily. 06/17/15   Carlisle Cater, PA-C  cloNIDine (CATAPRES) 0.2 MG tablet Take 0.5 tablets (0.1 mg total) by mouth 2 (two) times daily. 05/21/15 06/17/15  Lacretia Leigh, MD    Physical Exam: Vitals:   09/12/17 2100 09/12/17 2105 09/12/17 2110 09/12/17 2115  BP: (!) 262/136 (!) 245/118 (!) 233/115 (!) 224/123  Pulse: 80 79 75 82  Resp: 17 (!) 21 (!) 25 (!) 23  Temp:      TempSrc:      SpO2: 100% 99% 95% 99%      Constitutional: NAD, calm  Eyes: PERTLA, lids and conjunctivae normal ENMT: Mucous membranes are moist. Posterior pharynx clear of any exudate or lesions.   Neck: normal, supple, no masses, no thyromegaly Respiratory: Mild dyspnea with speech, no wheezing, no crackles. Normal respiratory effort.   Cardiovascular: S1 & S2 heard,  regular rate and rhythm. 2+ pretibial edema bilaterally.  Abdomen: No distension, no tenderness, no masses palpated. Bowel sounds normal.  Musculoskeletal: no clubbing / cyanosis. No joint deformity upper and lower extremities.    Skin: no significant rashes, lesions, ulcers. Warm, dry, well-perfused. Neurologic: CN 2-12 grossly intact. Sensation intact. Strength 5/5 in all 4 limbs.  Psychiatric:  Alert and oriented x 3. Calm, cooperative.     Labs on Admission: I have personally reviewed following labs and imaging studies  CBC: Recent Labs  Lab 09/12/17 1430  WBC 5.6  HGB 12.8  HCT 39.5  MCV 89.4  PLT 161   Basic Metabolic Panel: Recent Labs  Lab 09/12/17 1430  NA 141  K 3.5  CL 108  CO2 23  GLUCOSE 117*  BUN 14  CREATININE 1.31*  CALCIUM 9.2   GFR: CrCl cannot be calculated (Unknown ideal weight.). Liver Function Tests: No results for input(s): AST, ALT, ALKPHOS, BILITOT, PROT, ALBUMIN in the last 168 hours. No results for input(s): LIPASE, AMYLASE in the last 168 hours. No results for input(s): AMMONIA in the last 168 hours. Coagulation Profile: No results for input(s): INR, PROTIME in the last 168 hours. Cardiac Enzymes: No results for input(s): CKTOTAL, CKMB, CKMBINDEX, TROPONINI in the last 168 hours. BNP (last 3 results) No results for input(s): PROBNP in the last 8760 hours. HbA1C: No results for input(s): HGBA1C in the last 72 hours. CBG: No results for input(s): GLUCAP in the last 168 hours. Lipid Profile: No results for input(s): CHOL, HDL, LDLCALC, TRIG, CHOLHDL, LDLDIRECT in the last 72 hours. Thyroid Function Tests: No results for input(s): TSH, T4TOTAL, FREET4, T3FREE, THYROIDAB in the last 72 hours. Anemia Panel: No results for input(s): VITAMINB12, FOLATE, FERRITIN, TIBC, IRON, RETICCTPCT in the last 72 hours. Urine analysis:    Component Value Date/Time   COLORURINE YELLOW 03/18/2009 0617   APPEARANCEUR CLEAR 03/18/2009 0617   LABSPEC  1.025 12/02/2011 1924   PHURINE 5.5 12/02/2011 1924   GLUCOSEU NEGATIVE 12/02/2011 1924   HGBUR LARGE (A) 12/02/2011 1924   BILIRUBINUR NEGATIVE 12/02/2011 1924   KETONESUR NEGATIVE 12/02/2011 1924   PROTEINUR NEGATIVE 12/02/2011 1924   UROBILINOGEN 0.2 12/02/2011 1924   NITRITE NEGATIVE 12/02/2011 1924   LEUKOCYTESUR SMALL (A) 12/02/2011 1924   Sepsis Labs: @LABRCNTIP (procalcitonin:4,lacticidven:4) )No results found for this or any previous visit (from the past 240 hour(s)).   Radiological Exams on Admission: Dg Chest 2 View  Result Date: 09/12/2017 CLINICAL DATA:  Shortness of breath. Heaviness in chest. History of left breast mass. EXAM: CHEST - 2 VIEW COMPARISON:  12/02/2011. FINDINGS: Mediastinum hilar structures normal. Cardiomegaly. Mild pulmonary venous congestion. Mild bilateral interstitial prominence. No focal infiltrate. No pleural effusion or pneumothorax. Degenerative change thoracic spine. Surgical clips right upper abdomen. IMPRESSION: Cardiomegaly. Mild pulmonary venous congestion. Mild bilateral pulmonary interstitial prominence. Mild CHF may be present. Electronically Signed   By: Marcello Moores  Register  On: 09/12/2017 15:11    EKG: Independently reviewed. Sinus rhythm, RAE, inferolateral T-wave abnormality.   Assessment/Plan   1. Hypertensive crisis with chest pain and acute CHF  - Presents with SOB and chest discomfort, worsening insidiously over the course of months   - She is found to have BP as high as 260/136, elevated BNP, peripheral edema, and CHF-findings on CXR  - Initial troponin negative  - She was given 40 mg IV Lasix in ED and started on nitroglycerin infusion  - Rule-out dissection with CTA, titrate NTG infusion, start Lopressor in light chest pain, hold ASA pending CTA results, continue cardiac monitoring, obtain serial troponin measurements, continue diuresis with IV Lasix, follow I/O's, and check echocardiogram    2. Mild renal insufficiency  - SCr is  1.31 on admission, up from 0.99 on most recent prior from 2016  - Likely secondary to uncontrolled HTN  - Renally-dose medications, repeat chem panel in am    3. Dysfunctional uterine bleeding  - Reports vaginal bleeding x6 months  - She saw OBGYN earlier in the course, reports a negative uterine biopsy  - H&H stable, continue outpatient follow-up    DVT prophylaxis: SCD's  Code Status: Full  Family Communication: Discussed with patient Consults called: None Admission status: Observation    Vianne Bulls, MD [facility] Pager [phone]  If 7PM-7AM, please contact night-coverage [email or web address] Password [facility]  09/12/2017, 10:19 PM

## 2017-09-12 NOTE — ED Notes (Signed)
Pt states she has been off of medication for 2 weeks for HTN. Pt states she also has swelling in feet and hands.

## 2017-09-12 NOTE — ED Triage Notes (Signed)
Pt reports sob with exertion this has been going on for months but recently got worse. Pt also reports vaginal bleeding for 6 months. Pt states today bleeding in light.

## 2017-09-13 ENCOUNTER — Observation Stay (HOSPITAL_COMMUNITY): Payer: PRIVATE HEALTH INSURANCE

## 2017-09-13 ENCOUNTER — Inpatient Hospital Stay (HOSPITAL_COMMUNITY)
Admit: 2017-09-13 | Discharge: 2017-09-13 | Disposition: A | Discharge status: Home / Self Care | Payer: PRIVATE HEALTH INSURANCE | Attending: Internal Medicine | Admitting: Internal Medicine

## 2017-09-13 ENCOUNTER — Other Ambulatory Visit: Payer: Self-pay

## 2017-09-13 ENCOUNTER — Encounter (HOSPITAL_COMMUNITY): Payer: Self-pay

## 2017-09-13 DIAGNOSIS — I509 Heart failure, unspecified: Secondary | ICD-10-CM

## 2017-09-13 DIAGNOSIS — E785 Hyperlipidemia, unspecified: Secondary | ICD-10-CM | POA: Diagnosis present

## 2017-09-13 DIAGNOSIS — D252 Subserosal leiomyoma of uterus: Secondary | ICD-10-CM | POA: Diagnosis present

## 2017-09-13 DIAGNOSIS — I5033 Acute on chronic diastolic (congestive) heart failure: Secondary | ICD-10-CM | POA: Diagnosis present

## 2017-09-13 DIAGNOSIS — N179 Acute kidney failure, unspecified: Secondary | ICD-10-CM | POA: Diagnosis present

## 2017-09-13 DIAGNOSIS — N939 Abnormal uterine and vaginal bleeding, unspecified: Secondary | ICD-10-CM | POA: Diagnosis present

## 2017-09-13 DIAGNOSIS — Z79899 Other long term (current) drug therapy: Secondary | ICD-10-CM | POA: Diagnosis not present

## 2017-09-13 DIAGNOSIS — Z9114 Patient's other noncompliance with medication regimen: Secondary | ICD-10-CM | POA: Diagnosis not present

## 2017-09-13 DIAGNOSIS — R072 Precordial pain: Secondary | ICD-10-CM | POA: Diagnosis not present

## 2017-09-13 DIAGNOSIS — R0602 Shortness of breath: Secondary | ICD-10-CM | POA: Diagnosis not present

## 2017-09-13 DIAGNOSIS — R609 Edema, unspecified: Secondary | ICD-10-CM

## 2017-09-13 DIAGNOSIS — R0789 Other chest pain: Secondary | ICD-10-CM | POA: Diagnosis not present

## 2017-09-13 DIAGNOSIS — F17210 Nicotine dependence, cigarettes, uncomplicated: Secondary | ICD-10-CM | POA: Diagnosis present

## 2017-09-13 DIAGNOSIS — I110 Hypertensive heart disease with heart failure: Secondary | ICD-10-CM | POA: Diagnosis present

## 2017-09-13 DIAGNOSIS — I161 Hypertensive emergency: Secondary | ICD-10-CM | POA: Diagnosis present

## 2017-09-13 DIAGNOSIS — I169 Hypertensive crisis, unspecified: Secondary | ICD-10-CM | POA: Diagnosis not present

## 2017-09-13 DIAGNOSIS — N938 Other specified abnormal uterine and vaginal bleeding: Secondary | ICD-10-CM | POA: Diagnosis present

## 2017-09-13 DIAGNOSIS — T502X5A Adverse effect of carbonic-anhydrase inhibitors, benzothiadiazides and other diuretics, initial encounter: Secondary | ICD-10-CM | POA: Diagnosis present

## 2017-09-13 HISTORY — DX: Heart failure, unspecified: I50.9

## 2017-09-13 LAB — TROPONIN I

## 2017-09-13 LAB — CBC
HCT: 38 % (ref 36.0–46.0)
Hemoglobin: 12.1 g/dL (ref 12.0–15.0)
MCH: 28.2 pg (ref 26.0–34.0)
MCHC: 31.8 g/dL (ref 30.0–36.0)
MCV: 88.6 fL (ref 78.0–100.0)
Platelets: 265 10*3/uL (ref 150–400)
RBC: 4.29 MIL/uL (ref 3.87–5.11)
RDW: 15.1 % (ref 11.5–15.5)
WBC: 5.7 10*3/uL (ref 4.0–10.5)

## 2017-09-13 LAB — RAPID URINE DRUG SCREEN, HOSP PERFORMED
Amphetamines: NOT DETECTED
BARBITURATES: NOT DETECTED
Benzodiazepines: NOT DETECTED
Cocaine: POSITIVE — AB
OPIATES: NOT DETECTED
TETRAHYDROCANNABINOL: NOT DETECTED

## 2017-09-13 LAB — BASIC METABOLIC PANEL
Anion gap: 11 (ref 5–15)
BUN: 19 mg/dL (ref 6–20)
CO2: 24 mmol/L (ref 22–32)
CREATININE: 1.26 mg/dL — AB (ref 0.44–1.00)
Calcium: 8.8 mg/dL — ABNORMAL LOW (ref 8.9–10.3)
Chloride: 104 mmol/L (ref 101–111)
GFR calc Af Amer: 55 mL/min — ABNORMAL LOW (ref >60)
GFR, EST NON AFRICAN AMERICAN: 47 mL/min — AB (ref >60)
GLUCOSE: 106 mg/dL — AB (ref 65–99)
POTASSIUM: 3.6 mmol/L (ref 3.5–5.1)
Sodium: 139 mmol/L (ref 135–145)

## 2017-09-13 LAB — ECHOCARDIOGRAM COMPLETE

## 2017-09-13 LAB — HIV ANTIBODY (ROUTINE TESTING W REFLEX): HIV SCREEN 4TH GENERATION: NONREACTIVE

## 2017-09-13 MED ORDER — OXYCODONE-ACETAMINOPHEN 5-325 MG PO TABS
1.0000 | ORAL_TABLET | ORAL | Status: DC | PRN
  Administered 2017-09-13 (×2): 1 via ORAL
  Administered 2017-09-13 – 2017-09-16 (×3): 2 via ORAL
  Filled 2017-09-13: qty 1
  Filled 2017-09-13 (×4): qty 2
  Filled 2017-09-13: qty 1

## 2017-09-13 MED ORDER — HYDRALAZINE HCL 20 MG/ML IJ SOLN
10.0000 mg | Freq: Four times a day (QID) | INTRAMUSCULAR | Status: DC | PRN
  Administered 2017-09-16: 10 mg via INTRAVENOUS
  Filled 2017-09-13: qty 1

## 2017-09-13 NOTE — Progress Notes (Signed)
Patient stable, comfortable, and resting. Report given to current RN.

## 2017-09-13 NOTE — Progress Notes (Signed)
Patient states she gets headaches, received pain medicine at shift change, now doesn't complain of any pain. Says it her head only hurts when she coughs.

## 2017-09-13 NOTE — Progress Notes (Signed)
Pt arrives from ED. C/o headache s/p coughing spell. Pt is lying in bed, with purewick in place upon arrival.

## 2017-09-13 NOTE — ED Notes (Signed)
Dr. Rai at bedside 

## 2017-09-13 NOTE — Progress Notes (Signed)
Vascular lab ready for patient. Will finish admission upon her return.

## 2017-09-13 NOTE — Progress Notes (Signed)
Preliminary notes by tech--Bilateral lower extremities venous duplex study completed. Negative for deep and superficial veins thrombosis. No popliteal fossa cysts seen.  Hongying Shanee Batch(RDMS RVT) 09/13/17 4:46 PM

## 2017-09-13 NOTE — Progress Notes (Signed)
Patient wearing gloves- states her 'hands get numb because of poor circulation".

## 2017-09-13 NOTE — ED Notes (Signed)
Pure wick placed and pt instructed in use.

## 2017-09-13 NOTE — Progress Notes (Signed)
[facility]                                                                              Patient Demographics  Angela Gardner, is a 57 y.o. female, DOB - 06-22-1960, INO:[id number]  Admit date - 09/12/2017   Admitting Physician Vianne Bulls, MD  Outpatient Primary MD for the patient is System, Pcp Not In  Outpatient specialists:   LOS - 0  days   Medical records reviewed and are as summarized below:    Chief Complaint  Patient presents with  . Shortness of Breath  . Vaginal Bleeding       Brief summary   Patient is a 57 year old female with hypertension, BUE, presented to ED with dyspnea, chest discomfort and bilateral leg edema.  Patient reported that she had not taken her antihypertensives over a month or more.  She had noted progressive insidious segment of exertional dyspnea and peripheral edema over a month.  In ED BP was as high as 260/136, EKG showed inferolateral T wave abnormality.  Chest x-ray with pulmonary vascular congestion, BNP 258.  Patient was given Lasix 40 mg IV x1 and started on nitroglycerin infusion. CTA chest negative for PE, aortic aneurysm or dissection.  Assessment & Plan    Principal Problem:   Acute CHF (congestive heart failure) (HCC) likely precipitated due to hypertensive crisis -BP 260/136 at the time of admission with elevated BNP, peripheral edema, pulmonary vascular congestion on chest x-ray -Troponins x3 neg. mild T wave inversions in 1 aVL and V6 -Continue Lasix 40 mg IV q12hrs, follow strict I's and O's and daily weights -Currently no chest pain, BP significantly improved, having significant headache, discontinued nitro drip. -Follow 2D echo, venous Dopplers to rule out DVT due to asymmetric edema  Active Problems:   Hypertensive crisis -Due to noncompliance, patient states she ran out of antihypertensives, reviewed her medication list she was only on clonidine 0.2 mg twice a day which is not appropriate first-line  medication -DC nitro drip, patient miserable with headache, BP improving in 130s during my examination.  - Continue IV Lasix, started on Lopressor 25 mg twice a day, hydralazine IV as needed with parameters    Mild acute renal insufficiency likely due to poor renal perfusion from acute CHF and hypertensive crisis -Creatinine 1.3 at the time of admission, improving to 1.2 today    Dysfunctional uterine bleeding -Currently H&H stable, outpatient follow-up with OB/GYN    Code Status: Full CODE STATUS DVT Prophylaxis:   SCD's Family Communication: Discussed in detail with the patient, all imaging results, lab results explained to the patient    Disposition Plan: Once medically stable Time Spent in minutes   25 minutes  Procedures:  CT angiogram of the chest  Consultants:   None Antimicrobials:      Medications  Scheduled Meds: . furosemide  40 mg Intravenous Q12H  . metoprolol tartrate  25 mg Oral BID   Continuous Infusions: PRN Meds:.acetaminophen, ALPRAZolam, hydrALAZINE, ondansetron (ZOFRAN) IV, oxyCODONE-acetaminophen   Antibiotics   Anti-infectives (From admission, onward)   None        Subjective:   [name]  was seen and examined today.  Feeling miserable with headache, no chest discomfort, shortness of breath improving.  No fevers or chills. Patient denies dizziness,  abdominal pain, N/V/D/C, new weakness, numbess, tingling.   Objective:   Vitals:   09/13/17 0937 09/13/17 1000 09/13/17 1030 09/13/17 1100  BP: 136/79 (!) 157/87 (!) 144/90 (!) 156/87  Pulse: (!) 55 (!) 56 (!) 56 64  Resp: 20 20 20 20   Temp:      TempSrc:      SpO2: 97% 96% 99% 98%    Intake/Output Summary (Last 24 hours) at 09/13/2017 1155 Last data filed at 09/12/2017 2107 Gross per 24 hour  Intake 2.5 ml  Output -  Net 2.5 ml     Wt Readings from Last 3 Encounters:  06/17/15 98.9 kg (218 lb)  06/17/15 98.9 kg (218 lb)  12/20/11 100.5 kg (221 lb 9.6 oz)      Exam  General: Alert and oriented x 3, NAD  Eyes:  HEENT:  Atraumatic, normocephalic  Cardiovascular: S1 S2 auscultated,  Regular rate and rhythm.  Respiratory: No wheezing, decreased breath sound at the bases  Gastrointestinal: Soft, nontender, nondistended, + bowel sounds  Ext: 1- 2+ pedal edema bilaterally  Neuro: AAOx3, Cr N's II- XII. Strength 5/5 upper and lower extremities bilaterally, speech clear  Musculoskeletal: No digital cyanosis, clubbing  Skin: No rashes  Psych: Normal affect and demeanor, alert and oriented x3    Data Reviewed:  I have personally reviewed following labs and imaging studies  Micro Results No results found for this or any previous visit (from the past 240 hour(s)).  Radiology Reports Dg Chest 2 View  Result Date: 09/12/2017 CLINICAL DATA:  Shortness of breath. Heaviness in chest. History of left breast mass. EXAM: CHEST - 2 VIEW COMPARISON:  12/02/2011. FINDINGS: Mediastinum hilar structures normal. Cardiomegaly. Mild pulmonary venous congestion. Mild bilateral interstitial prominence. No focal infiltrate. No pleural effusion or pneumothorax. Degenerative change thoracic spine. Surgical clips right upper abdomen. IMPRESSION: Cardiomegaly. Mild pulmonary venous congestion. Mild bilateral pulmonary interstitial prominence. Mild CHF may be present. Electronically Signed   By: Marcello Moores  Register   On: 09/12/2017 15:11   Ct Angio Chest/abd/pel For Dissection W And/or W/wo  Result Date: 09/13/2017 CLINICAL DATA:  57 year old female with shortness of breath and dyspnea. Chest pain and leg swelling. EXAM: CT ANGIOGRAPHY CHEST, ABDOMEN AND PELVIS TECHNIQUE: Multidetector CT imaging through the chest, abdomen and pelvis was performed using the standard protocol during bolus administration of intravenous contrast. Multiplanar reconstructed images and MIPs were obtained and reviewed to evaluate the vascular anatomy. CONTRAST:  1110mL ISOVUE-370 IOPAMIDOL  (ISOVUE-370) INJECTION 76% COMPARISON:  Chest radiograph dated 09/12/2017 FINDINGS: CTA CHEST FINDINGS Cardiovascular: There is mild cardiomegaly with left ventricular hypertrophy. No pericardial effusion. The thoracic aorta is unremarkable. No aneurysmal dilatation or dissection. The origins of the great vessels of the aortic arch appear patent as visualized. There is no CT evidence of pulmonary embolism. Mediastinum/Nodes: No hilar or mediastinal adenopathy. Esophagus is grossly unremarkable as visualized. No mediastinal fluid collection. Lungs/Pleura: Left lung base linear atelectasis/scarring. Mild mosaic appearance of the lung bases, likely related to air trapping. There is a 3 mm left upper lobe nodule (series 8, image 51). There is no focal consolidation, pleural effusion, or pneumothorax. The central airways are patent. Musculoskeletal: No chest wall abnormality. No acute or significant osseous findings. Review of the MIP images confirms the above findings. CTA ABDOMEN AND PELVIS FINDINGS VASCULAR Aorta: Normal caliber aorta without aneurysm, dissection,  vasculitis or significant stenosis. Celiac: Patent without evidence of aneurysm, dissection, vasculitis or significant stenosis. SMA: Patent without evidence of aneurysm, dissection, vasculitis or significant stenosis. Renals: Both renal arteries are patent without evidence of aneurysm, dissection, vasculitis, fibromuscular dysplasia or significant stenosis. IMA: Patent without evidence of aneurysm, dissection, vasculitis or significant stenosis. Inflow: Patent without evidence of aneurysm, dissection, vasculitis or significant stenosis. Veins: No obvious venous abnormality within the limitations of this arterial phase study. Review of the MIP images confirms the above findings. NON-VASCULAR No intra-abdominal free air or free fluid. Hepatobiliary: Cholecystectomy.  The liver is unremarkable. Pancreas: Unremarkable. No pancreatic ductal dilatation or  surrounding inflammatory changes. Spleen: Normal in size without focal abnormality. Adrenals/Urinary Tract: Adrenal glands are unremarkable. Kidneys are normal, without renal calculi, focal lesion, or hydronephrosis. Bladder is unremarkable. Stomach/Bowel: Stomach is within normal limits. Appendix appears normal. No evidence of bowel wall thickening, distention, or inflammatory changes. Lymphatic: No adenopathy. Reproductive: The uterus and ovaries are grossly unremarkable as visualized. Other: None Musculoskeletal: Multilevel degenerative disc disease. No acute osseous pathology. Review of the MIP images confirms the above findings. IMPRESSION: 1. No acute intrathoracic, abdominal, or pelvic pathology. No aortic aneurysm or dissection. No CT evidence of pulmonary embolus. 2. Cardiomegaly with left ventricular hypertrophy. Electronically Signed   By: Anner Crete M.D.   On: 09/13/2017 00:16    Lab Data:  CBC: Recent Labs  Lab 09/12/17 1430 09/13/17 0344  WBC 5.6 5.7  HGB 12.8 12.1  HCT 39.5 38.0  MCV 89.4 88.6  PLT 278 517   Basic Metabolic Panel: Recent Labs  Lab 09/12/17 1430 09/13/17 0344  NA 141 139  K 3.5 3.6  CL 108 104  CO2 23 24  GLUCOSE 117* 106*  BUN 14 19  CREATININE 1.31* 1.26*  CALCIUM 9.2 8.8*   GFR: CrCl cannot be calculated (Unknown ideal weight.). Liver Function Tests: No results for input(s): AST, ALT, ALKPHOS, BILITOT, PROT, ALBUMIN in the last 168 hours. No results for input(s): LIPASE, AMYLASE in the last 168 hours. No results for input(s): AMMONIA in the last 168 hours. Coagulation Profile: No results for input(s): INR, PROTIME in the last 168 hours. Cardiac Enzymes: Recent Labs  Lab 09/12/17 2320 09/13/17 0344 09/13/17 1010  TROPONINI <0.03 <0.03 <0.03   BNP (last 3 results) No results for input(s): PROBNP in the last 8760 hours. HbA1C: No results for input(s): HGBA1C in the last 72 hours. CBG: No results for input(s): GLUCAP in the last  168 hours. Lipid Profile: No results for input(s): CHOL, HDL, LDLCALC, TRIG, CHOLHDL, LDLDIRECT in the last 72 hours. Thyroid Function Tests: No results for input(s): TSH, T4TOTAL, FREET4, T3FREE, THYROIDAB in the last 72 hours. Anemia Panel: No results for input(s): VITAMINB12, FOLATE, FERRITIN, TIBC, IRON, RETICCTPCT in the last 72 hours. Urine analysis:    Component Value Date/Time   COLORURINE YELLOW 03/18/2009 0617   APPEARANCEUR CLEAR 03/18/2009 0617   LABSPEC 1.025 12/02/2011 1924   PHURINE 5.5 12/02/2011 1924   GLUCOSEU NEGATIVE 12/02/2011 1924   HGBUR LARGE (A) 12/02/2011 1924   BILIRUBINUR NEGATIVE 12/02/2011 1924   KETONESUR NEGATIVE 12/02/2011 1924   PROTEINUR NEGATIVE 12/02/2011 1924   UROBILINOGEN 0.2 12/02/2011 1924   NITRITE NEGATIVE 12/02/2011 1924   LEUKOCYTESUR SMALL (A) 12/02/2011 1924     Emmauel Hallums M.D. [facility] 09/13/2017, 11:55 AM  Pager: [phone] Between 7am to 7pm - call Pager - [phone]  After 7pm go to [email or web address] - password [email or web address]  Call night coverage person  covering after 7pm

## 2017-09-13 NOTE — ED Notes (Signed)
Echo at bedsdie

## 2017-09-13 NOTE — ED Notes (Signed)
Pt sitting up in bed eating lunch and tolerating well.

## 2017-09-13 NOTE — Plan of Care (Signed)
  Problem: Education: Goal: Knowledge of General Education information will improve Outcome: Progressing   Problem: Health Behavior/Discharge Planning: Goal: Ability to manage health-related needs will improve Outcome: Progressing   Problem: Clinical Measurements: Goal: Ability to maintain clinical measurements within normal limits will improve Outcome: Progressing   Problem: Clinical Measurements: Goal: Respiratory complications will improve Outcome: Progressing   Problem: Activity: Goal: Risk for activity intolerance will decrease Outcome: Progressing   Problem: Cardiac: Goal: Ability to achieve and maintain adequate cardiopulmonary perfusion will improve Outcome: Progressing

## 2017-09-13 NOTE — Progress Notes (Signed)
  Echocardiogram 2D Echocardiogram has been performed.  Randa Lynn Copelan Maultsby 09/13/2017, 10:48 AM

## 2017-09-14 ENCOUNTER — Other Ambulatory Visit (HOSPITAL_COMMUNITY): Payer: PRIVATE HEALTH INSURANCE

## 2017-09-14 DIAGNOSIS — I5033 Acute on chronic diastolic (congestive) heart failure: Secondary | ICD-10-CM

## 2017-09-14 LAB — BASIC METABOLIC PANEL
ANION GAP: 8 (ref 5–15)
BUN: 20 mg/dL (ref 6–20)
CALCIUM: 8.8 mg/dL — AB (ref 8.9–10.3)
CHLORIDE: 103 mmol/L (ref 101–111)
CO2: 26 mmol/L (ref 22–32)
CREATININE: 1.58 mg/dL — AB (ref 0.44–1.00)
GFR calc Af Amer: 41 mL/min — ABNORMAL LOW (ref >60)
GFR calc non Af Amer: 36 mL/min — ABNORMAL LOW (ref >60)
GLUCOSE: 99 mg/dL (ref 65–99)
Potassium: 3.4 mmol/L — ABNORMAL LOW (ref 3.5–5.1)
Sodium: 137 mmol/L (ref 135–145)

## 2017-09-14 LAB — CBC
HEMATOCRIT: 40.2 % (ref 36.0–46.0)
Hemoglobin: 12.7 g/dL (ref 12.0–15.0)
MCH: 28.3 pg (ref 26.0–34.0)
MCHC: 31.6 g/dL (ref 30.0–36.0)
MCV: 89.5 fL (ref 78.0–100.0)
Platelets: 272 10*3/uL (ref 150–400)
RBC: 4.49 MIL/uL (ref 3.87–5.11)
RDW: 15.1 % (ref 11.5–15.5)
WBC: 5.7 10*3/uL (ref 4.0–10.5)

## 2017-09-14 MED ORDER — POTASSIUM CHLORIDE CRYS ER 20 MEQ PO TBCR
40.0000 meq | EXTENDED_RELEASE_TABLET | Freq: Once | ORAL | Status: AC
  Administered 2017-09-14: 40 meq via ORAL
  Filled 2017-09-14: qty 2

## 2017-09-14 NOTE — Progress Notes (Signed)
Patient complaining of headache. Pain meds given. Patient refused SCD's.

## 2017-09-14 NOTE — Progress Notes (Addendum)
[facility]                                                                              Patient Demographics  Angela Gardner, is a 57 y.o. female, DOB - 1960/10/31, PFX:[id number]  Admit date - 09/12/2017   Admitting Physician Vianne Bulls, MD  Outpatient Primary MD for the patient is System, Pcp Not In  Outpatient specialists:   LOS - 1  days   Medical records reviewed and are as summarized below:    Chief Complaint  Patient presents with  . Shortness of Breath  . Vaginal Bleeding       Brief summary   Patient is a 57 year old female with hypertension, BUE, presented to ED with dyspnea, chest discomfort and bilateral leg edema.  Patient reported that she had not taken her antihypertensives over a month or more.  She had noted progressive insidious segment of exertional dyspnea and peripheral edema over a month.  In ED BP was as high as 260/136, EKG showed inferolateral T wave abnormality.  Chest x-ray with pulmonary vascular congestion, BNP 258.  Patient was given Lasix 40 mg IV x1 and started on nitroglycerin infusion. CTA chest negative for PE, aortic aneurysm or dissection.  Assessment & Plan    Principal Problem:   Acute diastolic CHF (congestive heart failure) (HCC) likely precipitated due to hypertensive crisis -BP 260/136 at the time of admission with elevated BNP, peripheral edema, pulmonary vascular congestion on chest x-ray -Troponins x3 neg. mild T wave inversions in 1 aVL and V6 -Currently on IV Lasix 40 mg every 12 hours, hold further IV Lasix, creatinine trended up to 1.5.   -2D echo showed EF of 32%, grade 2 diastolic dysfunction no wall motion abnormalities  Active Problems:   Hypertensive crisis -Due to noncompliance, patient states she ran out of antihypertensives, reviewed her medication list she was only on clonidine 0.2 mg twice a day which is not appropriate first-line medication -Hold Lasix, continue beta-blocker    Mild acute  kidney injury:  likely due to poor renal perfusion from acute CHF and hypertensive crisis. No history of CKD.  -Creatinine trended up to 1.5 today likely due to diuresis, hold Lasix  Intermittent dysfunctional uterine bleeding -Currently H&H stable, outpatient follow-up with OB/GYN -Patient had a transvaginal ultrasound in 12/16 which had shown severe endometrial thickening, large echogenic focus in the endometrial canal, underwent biopsy which was negative for malignancy -Patient states that she has not followed by OB/GYN since then, recommended to follow-up after discharge.    Code Status: Full CODE STATUS DVT Prophylaxis:   SCD's Family Communication: Discussed in detail with the patient, all imaging results, lab results explained to the patient    Disposition Plan: Once medically stable Time Spent in minutes   25 minutes  Procedures:  CT angiogram of the chest 2D echocardiogram; severe LVH, EF 99%, grade 2 diastolic dysfunction  Consultants:   None   Antimicrobials:      Medications  Scheduled Meds: . furosemide  40 mg Intravenous Q12H  . metoprolol tartrate  25 mg Oral BID   Continuous Infusions: PRN Meds:.acetaminophen, ALPRAZolam, hydrALAZINE, ondansetron (  ZOFRAN) IV, oxyCODONE-acetaminophen   Antibiotics   Anti-infectives (From admission, onward)   None        Subjective:   Angela Gardner was seen and examined today.  Still feels somewhat short of breath, however feels improving from admission.  No chest pain.  No fevers or chills. Patient denies dizziness,  abdominal pain, N/V/D/C, new weakness, numbess, tingling.   Objective:   Vitals:   09/13/17 2048 09/13/17 2334 09/14/17 0607 09/14/17 0856  BP: (!) 163/84 (!) 170/91 129/67 (!) 139/54  Pulse: (!) 55 (!) 59 (!) 55 (!) 51  Resp: 18 20 18    Temp: 98.4 F (36.9 C) 97.8 F (36.6 C) 97.9 F (36.6 C)   TempSrc: Oral Oral Oral   SpO2: 95% 98% 94%   Weight:   101.3 kg (223 lb 4.8 oz)   Height:         Intake/Output Summary (Last 24 hours) at 09/14/2017 1416 Last data filed at 09/14/2017 0900 Gross per 24 hour  Intake 810 ml  Output 800 ml  Net 10 ml     Wt Readings from Last 3 Encounters:  09/14/17 101.3 kg (223 lb 4.8 oz)  06/17/15 98.9 kg (218 lb)  06/17/15 98.9 kg (218 lb)     Exam   General: Alert and oriented x 3, NAD  Eyes:   HEENT:  Atraumatic, normocephalic  Cardiovascular: S1 S2 auscultated. Regular rate and rhythm. 1+pedal edema b/l  Respiratory: Decreased breath sound at the bases  Gastrointestinal: Soft, nontender, nondistended, + bowel sounds  Ext: 1+ pedal edema bilaterally  Neuro: no new deficits  Musculoskeletal: No digital cyanosis, clubbing  Skin: No rashes  Psych: Normal affect and demeanor, alert and oriented x3    Data Reviewed:  I have personally reviewed following labs and imaging studies  Micro Results No results found for this or any previous visit (from the past 240 hour(s)).  Radiology Reports Dg Chest 2 View  Result Date: 09/12/2017 CLINICAL DATA:  Shortness of breath. Heaviness in chest. History of left breast mass. EXAM: CHEST - 2 VIEW COMPARISON:  12/02/2011. FINDINGS: Mediastinum hilar structures normal. Cardiomegaly. Mild pulmonary venous congestion. Mild bilateral interstitial prominence. No focal infiltrate. No pleural effusion or pneumothorax. Degenerative change thoracic spine. Surgical clips right upper abdomen. IMPRESSION: Cardiomegaly. Mild pulmonary venous congestion. Mild bilateral pulmonary interstitial prominence. Mild CHF may be present. Electronically Signed   By: Marcello Moores  Register   On: 09/12/2017 15:11   Ct Angio Chest/abd/pel For Dissection W And/or W/wo  Result Date: 09/13/2017 CLINICAL DATA:  57 year old female with shortness of breath and dyspnea. Chest pain and leg swelling. EXAM: CT ANGIOGRAPHY CHEST, ABDOMEN AND PELVIS TECHNIQUE: Multidetector CT imaging through the chest, abdomen and pelvis was  performed using the standard protocol during bolus administration of intravenous contrast. Multiplanar reconstructed images and MIPs were obtained and reviewed to evaluate the vascular anatomy. CONTRAST:  122mL ISOVUE-370 IOPAMIDOL (ISOVUE-370) INJECTION 76% COMPARISON:  Chest radiograph dated 09/12/2017 FINDINGS: CTA CHEST FINDINGS Cardiovascular: There is mild cardiomegaly with left ventricular hypertrophy. No pericardial effusion. The thoracic aorta is unremarkable. No aneurysmal dilatation or dissection. The origins of the great vessels of the aortic arch appear patent as visualized. There is no CT evidence of pulmonary embolism. Mediastinum/Nodes: No hilar or mediastinal adenopathy. Esophagus is grossly unremarkable as visualized. No mediastinal fluid collection. Lungs/Pleura: Left lung base linear atelectasis/scarring. Mild mosaic appearance of the lung bases, likely related to air trapping. There is a 3 mm left upper lobe nodule (series 8,  image 51). There is no focal consolidation, pleural effusion, or pneumothorax. The central airways are patent. Musculoskeletal: No chest wall abnormality. No acute or significant osseous findings. Review of the MIP images confirms the above findings. CTA ABDOMEN AND PELVIS FINDINGS VASCULAR Aorta: Normal caliber aorta without aneurysm, dissection, vasculitis or significant stenosis. Celiac: Patent without evidence of aneurysm, dissection, vasculitis or significant stenosis. SMA: Patent without evidence of aneurysm, dissection, vasculitis or significant stenosis. Renals: Both renal arteries are patent without evidence of aneurysm, dissection, vasculitis, fibromuscular dysplasia or significant stenosis. IMA: Patent without evidence of aneurysm, dissection, vasculitis or significant stenosis. Inflow: Patent without evidence of aneurysm, dissection, vasculitis or significant stenosis. Veins: No obvious venous abnormality within the limitations of this arterial phase study.  Review of the MIP images confirms the above findings. NON-VASCULAR No intra-abdominal free air or free fluid. Hepatobiliary: Cholecystectomy.  The liver is unremarkable. Pancreas: Unremarkable. No pancreatic ductal dilatation or surrounding inflammatory changes. Spleen: Normal in size without focal abnormality. Adrenals/Urinary Tract: Adrenal glands are unremarkable. Kidneys are normal, without renal calculi, focal lesion, or hydronephrosis. Bladder is unremarkable. Stomach/Bowel: Stomach is within normal limits. Appendix appears normal. No evidence of bowel wall thickening, distention, or inflammatory changes. Lymphatic: No adenopathy. Reproductive: The uterus and ovaries are grossly unremarkable as visualized. Other: None Musculoskeletal: Multilevel degenerative disc disease. No acute osseous pathology. Review of the MIP images confirms the above findings. IMPRESSION: 1. No acute intrathoracic, abdominal, or pelvic pathology. No aortic aneurysm or dissection. No CT evidence of pulmonary embolus. 2. Cardiomegaly with left ventricular hypertrophy. Electronically Signed   By: Anner Crete M.D.   On: 09/13/2017 00:16    Lab Data:  CBC: Recent Labs  Lab 09/12/17 1430 09/13/17 0344 09/14/17 0616  WBC 5.6 5.7 5.7  HGB 12.8 12.1 12.7  HCT 39.5 38.0 40.2  MCV 89.4 88.6 89.5  PLT 278 265 341   Basic Metabolic Panel: Recent Labs  Lab 09/12/17 1430 09/13/17 0344 09/14/17 0616  NA 141 139 137  K 3.5 3.6 3.4*  CL 108 104 103  CO2 23 24 26   GLUCOSE 117* 106* 99  BUN 14 19 20   CREATININE 1.31* 1.26* 1.58*  CALCIUM 9.2 8.8* 8.8*   GFR: Estimated Creatinine Clearance: 46.6 mL/min (A) (by C-G formula based on SCr of 1.58 mg/dL (H)). Liver Function Tests: No results for input(s): AST, ALT, ALKPHOS, BILITOT, PROT, ALBUMIN in the last 168 hours. No results for input(s): LIPASE, AMYLASE in the last 168 hours. No results for input(s): AMMONIA in the last 168 hours. Coagulation Profile: No  results for input(s): INR, PROTIME in the last 168 hours. Cardiac Enzymes: Recent Labs  Lab 09/12/17 2320 09/13/17 0344 09/13/17 1010  TROPONINI <0.03 <0.03 <0.03   BNP (last 3 results) No results for input(s): PROBNP in the last 8760 hours. HbA1C: No results for input(s): HGBA1C in the last 72 hours. CBG: No results for input(s): GLUCAP in the last 168 hours. Lipid Profile: No results for input(s): CHOL, HDL, LDLCALC, TRIG, CHOLHDL, LDLDIRECT in the last 72 hours. Thyroid Function Tests: No results for input(s): TSH, T4TOTAL, FREET4, T3FREE, THYROIDAB in the last 72 hours. Anemia Panel: No results for input(s): VITAMINB12, FOLATE, FERRITIN, TIBC, IRON, RETICCTPCT in the last 72 hours. Urine analysis:    Component Value Date/Time   COLORURINE YELLOW 03/18/2009 0617   APPEARANCEUR CLEAR 03/18/2009 0617   LABSPEC 1.025 12/02/2011 1924   PHURINE 5.5 12/02/2011 1924   GLUCOSEU NEGATIVE 12/02/2011 1924   HGBUR LARGE (A) 12/02/2011 1924  BILIRUBINUR NEGATIVE 12/02/2011 1924   KETONESUR NEGATIVE 12/02/2011 1924   PROTEINUR NEGATIVE 12/02/2011 1924   UROBILINOGEN 0.2 12/02/2011 1924   NITRITE NEGATIVE 12/02/2011 1924   LEUKOCYTESUR SMALL (A) 12/02/2011 1924     Kenyette Gundy M.D. [facility] 09/14/2017, 2:16 PM  Pager: [phone] Between 7am to 7pm - call Pager - [phone]  After 7pm go to [email or web address] - password [email or web address]  Call night coverage person covering after 7pm

## 2017-09-14 NOTE — Plan of Care (Signed)
  Problem: Clinical Measurements: Goal: Ability to maintain clinical measurements within normal limits will improve Outcome: Progressing   Problem: Clinical Measurements: Goal: Diagnostic test results will improve Outcome: Progressing   Problem: Clinical Measurements: Goal: Cardiovascular complication will be avoided Outcome: Progressing   

## 2017-09-15 ENCOUNTER — Inpatient Hospital Stay (HOSPITAL_COMMUNITY): Payer: PRIVATE HEALTH INSURANCE

## 2017-09-15 LAB — CBC
HEMATOCRIT: 39.5 % (ref 36.0–46.0)
Hemoglobin: 12.5 g/dL (ref 12.0–15.0)
MCH: 28.5 pg (ref 26.0–34.0)
MCHC: 31.6 g/dL (ref 30.0–36.0)
MCV: 90 fL (ref 78.0–100.0)
PLATELETS: 266 10*3/uL (ref 150–400)
RBC: 4.39 MIL/uL (ref 3.87–5.11)
RDW: 15.1 % (ref 11.5–15.5)
WBC: 5 10*3/uL (ref 4.0–10.5)

## 2017-09-15 LAB — IRON AND TIBC
IRON: 42 ug/dL (ref 28–170)
Saturation Ratios: 13 % (ref 10.4–31.8)
TIBC: 335 ug/dL (ref 250–450)
UIBC: 293 ug/dL

## 2017-09-15 LAB — BASIC METABOLIC PANEL
ANION GAP: 9 (ref 5–15)
BUN: 30 mg/dL — ABNORMAL HIGH (ref 6–20)
CALCIUM: 8.6 mg/dL — AB (ref 8.9–10.3)
CO2: 23 mmol/L (ref 22–32)
CREATININE: 1.58 mg/dL — AB (ref 0.44–1.00)
Chloride: 103 mmol/L (ref 101–111)
GFR calc Af Amer: 41 mL/min — ABNORMAL LOW (ref >60)
GFR, EST NON AFRICAN AMERICAN: 36 mL/min — AB (ref >60)
GLUCOSE: 93 mg/dL (ref 65–99)
Potassium: 3.5 mmol/L (ref 3.5–5.1)
Sodium: 135 mmol/L (ref 135–145)

## 2017-09-15 LAB — VITAMIN B12: VITAMIN B 12: 308 pg/mL (ref 180–914)

## 2017-09-15 LAB — RETICULOCYTES
RBC.: 4.41 MIL/uL (ref 3.87–5.11)
Retic Count, Absolute: 48.5 10*3/uL (ref 19.0–186.0)
Retic Ct Pct: 1.1 % (ref 0.4–3.1)

## 2017-09-15 LAB — FOLATE: FOLATE: 14 ng/mL (ref >5.9)

## 2017-09-15 MED ORDER — MEGESTROL ACETATE 40 MG PO TABS: 40.0000 mg | ORAL_TABLET | Freq: Two times a day (BID) | ORAL | Status: DC

## 2017-09-15 MED ORDER — FUROSEMIDE 40 MG PO TABS
40.0000 mg | ORAL_TABLET | Freq: Every day | ORAL | Status: DC
  Administered 2017-09-15 – 2017-09-16 (×2): 40 mg via ORAL
  Filled 2017-09-15 (×2): qty 1

## 2017-09-15 MED ORDER — MEGESTROL ACETATE 40 MG PO TABS
40.0000 mg | ORAL_TABLET | Freq: Two times a day (BID) | ORAL | Status: DC
  Administered 2017-09-15 – 2017-09-16 (×3): 40 mg via ORAL
  Filled 2017-09-15 (×3): qty 1

## 2017-09-15 NOTE — Evaluation (Signed)
Physical Therapy Evaluation Patient Details Name: Angela Gardner MRN: [id number] DOB: [birth year] Today's Date: 09/15/2017   History of Present Illness  Pt is a 57 y/o female admitted secondary to DOE and vaginal bleeding. Pt foudn to have acute diastolic CHF. PMH including but not limited to HTN, HLD.    Clinical Impression  Pt presented supine in bed with HOB elevated, awake and willing to participate in therapy session. Prior to admission, pt reported that she was independent with all functional mobility and ADLs. Pt works full-time at [employer]. She currently performs bed mobility with supervision, transfers with supervision and ambulated in hallway without use of an AD with supervision for safety. Pt on RA throughout with SPO2 maintaining in high 90's. However, she fatigued quickly with ambulation and required three standing rest breaks. PT will continue to follow pt acutely to progress mobility as tolerated and to ensure a safe d/c home.    Follow Up Recommendations No PT follow up    Equipment Recommendations  None recommended by PT    Recommendations for Other Services       Precautions / Restrictions Restrictions Weight Bearing Restrictions: No      Mobility  Bed Mobility Overal bed mobility: Needs Assistance Bed Mobility: Supine to Sit     Supine to sit: Supervision     General bed mobility comments: increased time and effort, supervision for safety  Transfers Overall transfer level: Needs assistance Equipment used: None Transfers: Sit to/from Stand Sit to Stand: Supervision            Ambulation/Gait Ambulation/Gait assistance: Supervision Ambulation Distance (Feet): 200 Feet Assistive device: None Gait Pattern/deviations: Step-through pattern;Decreased step length - right;Decreased step length - left;Decreased stride length Gait velocity: decreased Gait velocity interpretation: Below normal speed for age/gender General Gait Details: pt with  increased dyspnea with ambulation; pt on RA with SPO2 maintaining in high 90's. Pt required three standing rest breaks secondary to fatigue  Stairs            Wheelchair Mobility    Modified Rankin (Stroke Patients Only)       Balance Overall balance assessment: Needs assistance Sitting-balance support: Feet supported Sitting balance-Leahy Scale: Good     Standing balance support: During functional activity;No upper extremity supported Standing balance-Leahy Scale: Good                               Pertinent Vitals/Pain Pain Assessment: Faces Faces Pain Scale: Hurts even more Pain Location: vagina Pain Descriptors / Indicators: Sore Pain Intervention(s): Monitored during session;Repositioned    Home Living Family/patient expects to be discharged to:: Private residence Living Arrangements: Alone Available Help at Discharge: Family Type of Home: Mobile home Home Access: Stairs to enter   Technical brewer of Steps: 3 Home Layout: One level Home Equipment: Cane - single point      Prior Function Level of Independence: Independent         Comments: works full-time at the [employer]        Extremity/Trunk Assessment   Upper Extremity Assessment Upper Extremity Assessment: Overall WFL for tasks assessed    Lower Extremity Assessment Lower Extremity Assessment: Overall WFL for tasks assessed       Communication   Communication: No difficulties  Cognition Arousal/Alertness: Awake/alert Behavior During Therapy: WFL for tasks assessed/performed Overall Cognitive Status: Within Functional Limits for tasks assessed  General Comments      Exercises     Assessment/Plan    PT Assessment Patient needs continued PT services  PT Problem List Decreased activity tolerance;Decreased balance;Decreased mobility;Decreased coordination;Cardiopulmonary status limiting  activity       PT Treatment Interventions DME instruction;Gait training;Stair training;Functional mobility training;Therapeutic activities;Therapeutic exercise;Neuromuscular re-education;Balance training;Patient/family education    PT Goals (Current goals can be found in the Care Plan section)  Acute Rehab PT Goals Patient Stated Goal: return back to work PT Goal Formulation: With patient Time For Goal Achievement: 09/29/17 Potential to Achieve Goals: Good    Frequency Min 3X/week   Barriers to discharge        Co-evaluation               AM-PAC PT "6 Clicks" Daily Activity  Outcome Measure Difficulty turning over in bed (including adjusting bedclothes, sheets and blankets)?: None Difficulty moving from lying on back to sitting on the side of the bed? : None Difficulty sitting down on and standing up from a chair with arms (e.g., wheelchair, bedside commode, etc,.)?: A Little Help needed moving to and from a bed to chair (including a wheelchair)?: None Help needed walking in hospital room?: None Help needed climbing 3-5 steps with a railing? : A Little 6 Click Score: 22    End of Session   Activity Tolerance: Patient limited by fatigue Patient left: in chair;with call bell/phone within reach Nurse Communication: Mobility status PT Visit Diagnosis: Other abnormalities of gait and mobility (R26.89)    Time: 6773-7366 PT Time Calculation (min) (ACUTE ONLY): 12 min   Charges:   PT Evaluation $PT Eval Low Complexity: 1 Low     PT G Codes:        [place], PT, DPT [place] 09/15/2017, 12:06 PM

## 2017-09-15 NOTE — Progress Notes (Signed)
[facility]                                                                              Patient Demographics  Angela Gardner, is a 57 y.o. female, DOB - Aug 22, 1960, PYP:[id number]  Admit date - 09/12/2017   Admitting Physician Vianne Bulls, MD  Outpatient Primary MD for the patient is System, Pcp Not In  Outpatient specialists:   LOS - 2  days   Medical records reviewed and are as summarized below:    Chief Complaint  Patient presents with  . Shortness of Breath  . Vaginal Bleeding       Brief summary   Patient is a 57 year old female with hypertension, BUE, presented to ED with dyspnea, chest discomfort and bilateral leg edema.  Patient reported that she had not taken her antihypertensives over a month or more.  She had noted progressive insidious segment of exertional dyspnea and peripheral edema over a month.  In ED BP was as high as 260/136, EKG showed inferolateral T wave abnormality.  Chest x-ray with pulmonary vascular congestion, BNP 258.  Patient was given Lasix 40 mg IV x1 and started on nitroglycerin infusion. CTA chest negative for PE, aortic aneurysm or dissection.  Assessment & Plan    Principal Problem:   Acute diastolic CHF (congestive heart failure) (HCC) likely precipitated due to hypertensive crisis -BP 260/136 at the time of admission with elevated BNP, peripheral edema, pulmonary vascular congestion on chest x-ray.Troponins x3 neg. mild T wave inversions in 1 aVL and V6 -Patient was placed on IV Lasix for diuresis, held yesterday as creatinine trended up to 1.5. -Creatinine stable at 1.5, placed on oral Lasix 40 mg daily, continue beta-blocker -2D echo showed EF of 24%, grade 2 diastolic dysfunction no wall motion abnormalities  Active Problems:   Hypertensive crisis -Due to noncompliance, patient states she ran out of antihypertensives, reviewed her medication list she was only on clonidine 0.2 mg twice a day which is not  appropriate first-line medication -BP stable, continue beta-blocker, Lasix    Mild acute kidney injury:  likely due to poor renal perfusion from acute CHF and hypertensive crisis. No history of CKD.  -Creatinine stable at 1.5, will restart oral Lasix  Intermittent dysfunctional uterine bleeding/menorrhagia -Patient had a TV ultrasound in 12/16 which had shown severe endometrial thickening, large echogenic focus in the endometrial canal, underwent biopsy which was negative for malignancy. Patient states that she has not followed by OB/GYN since then, recommended to follow-up after discharge. - Patient reports that she has been having-DUB for last 6 months and she is miserable.  Trans-vaginal and pelvic ultrasound showed large echogenic mass with a endometrium measuring 3.8x 3.8x 3.6 cm and a 3.8 cm fundal subserosal fibroid.  Similar findings in the prior ultrasound and biopsy was negative -Discussed with OB/GYN on call, Dr. Elly Modena, recommended Megace 40 mg twice a day until she follows up with OB/GYN.  Recommended that patient call [phone] and schedule appointment ASAP after discharge. -Will check iron profile  Code Status: Full CODE STATUS DVT Prophylaxis:   SCD's Family Communication: Discussed in detail with the patient, all imaging  results, lab results explained to the patient    Disposition Plan: Once medically stable Time Spent in minutes   25 minutes  Procedures:  CT angiogram of the chest 2D echocardiogram; severe LVH, EF 71%, grade 2 diastolic dysfunction  Consultants:   Discussed with OB/GYN, Dr. Elly Modena on phone  Antimicrobials:      Medications  Scheduled Meds: . furosemide  40 mg Oral Daily  . megestrol  40 mg Oral BID  . metoprolol tartrate  25 mg Oral BID   Continuous Infusions: PRN Meds:.acetaminophen, ALPRAZolam, hydrALAZINE, ondansetron (ZOFRAN) IV, oxyCODONE-acetaminophen   Antibiotics   Anti-infectives (From admission, onward)   None         Subjective:   Angela Gardner was seen and examined today.  Patient feels fatigued otherwise no chest pain and shortness of breath is improving.  However miserable from menorrhagia for 6 months.  No fevers. Patient denies dizziness,  abdominal pain, N/V/D/C, new weakness, numbess, tingling.   Objective:   Vitals:   09/14/17 2156 09/15/17 0006 09/15/17 0503 09/15/17 0854  BP: (!) 149/83 (!) 158/90 (!) 149/80 136/66  Pulse: 64 65 (!) 57 61  Resp: 18 18 18    Temp: 98.2 F (36.8 C) 98.1 F (36.7 C) 97.9 F (36.6 C)   TempSrc: Oral Oral Oral   SpO2: 99% 99% 96%   Weight:   102.2 kg (225 lb 6.4 oz)   Height:        Intake/Output Summary (Last 24 hours) at 09/15/2017 1412 Last data filed at 09/15/2017 0913 Gross per 24 hour  Intake 240 ml  Output 1200 ml  Net -960 ml     Wt Readings from Last 3 Encounters:  09/15/17 102.2 kg (225 lb 6.4 oz)  06/17/15 98.9 kg (218 lb)  06/17/15 98.9 kg (218 lb)     Exam   General: Alert and oriented x 3, NAD  Eyes:   HEENT:  Atraumatic, normocephalic  Cardiovascular: S1 S2 auscultated,Regular rate and rhythm. No pedal edema b/l  Respiratory: Decreased breath sound at the bases  Gastrointestinal: Soft, nontender, nondistended, + bowel sounds  Ext: no pedal edema bilaterally  Neuro: no new deficits  Musculoskeletal: No digital cyanosis, clubbing  Skin: No rashes  Psych: Normal affect and demeanor, alert and oriented x3    Data Reviewed:  I have personally reviewed following labs and imaging studies  Micro Results No results found for this or any previous visit (from the past 240 hour(s)).  Radiology Reports Dg Chest 2 View  Result Date: 09/12/2017 CLINICAL DATA:  Shortness of breath. Heaviness in chest. History of left breast mass. EXAM: CHEST - 2 VIEW COMPARISON:  12/02/2011. FINDINGS: Mediastinum hilar structures normal. Cardiomegaly. Mild pulmonary venous congestion. Mild bilateral interstitial prominence. No  focal infiltrate. No pleural effusion or pneumothorax. Degenerative change thoracic spine. Surgical clips right upper abdomen. IMPRESSION: Cardiomegaly. Mild pulmonary venous congestion. Mild bilateral pulmonary interstitial prominence. Mild CHF may be present. Electronically Signed   By: Marcello Moores  Register   On: 09/12/2017 15:11   US Pelvic Complete With Transvaginal  Result Date: 09/15/2017 CLINICAL DATA:  Menorrhagia for 6 months EXAM: TRANSABDOMINAL AND TRANSVAGINAL ULTRASOUND OF PELVIS TECHNIQUE: Both transabdominal and transvaginal ultrasound examinations of the pelvis were performed. Transabdominal technique was performed for global imaging of the pelvis including uterus, ovaries, adnexal regions, and pelvic cul-de-sac. It was necessary to proceed with endovaginal exam following the transabdominal exam to visualize the endometrium. COMPARISON:  None FINDINGS: Uterus Measurements: 11.2 x 5.0 x 6.9 cm.  Subserosal fundal fibroid measures up to 3.8 cm. Endometrium Thickness: 7 mm in thickness. Concern for echogenic mass centrally within the endometrium measuring 3.8 x 3.8 x 3.6 cm. Small amount of fluid within the endometrial canal Right ovary Measurements: 2.5 x 1.8 x 1.7 cm. Normal appearance/no adnexal mass. Left ovary Measurements: 4.2 x 3.4 x 2.1 cm. Normal appearance/no adnexal mass. Other findings No abnormal free fluid. IMPRESSION: Concern for large echogenic mass within the endometrium measuring 3.8 x 3.8 x 3.6 cm. Recommend further evaluation with sonohysterogram or hysteroscopy/biopsy. 3.8 cm fundal subserosal fibroid. Electronically Signed   By: Rolm Baptise M.D.   On: 09/15/2017 10:46   Ct Angio Chest/abd/pel For Dissection W And/or W/wo  Result Date: 09/13/2017 CLINICAL DATA:  57 year old female with shortness of breath and dyspnea. Chest pain and leg swelling. EXAM: CT ANGIOGRAPHY CHEST, ABDOMEN AND PELVIS TECHNIQUE: Multidetector CT imaging through the chest, abdomen and pelvis was performed  using the standard protocol during bolus administration of intravenous contrast. Multiplanar reconstructed images and MIPs were obtained and reviewed to evaluate the vascular anatomy. CONTRAST:  138mL ISOVUE-370 IOPAMIDOL (ISOVUE-370) INJECTION 76% COMPARISON:  Chest radiograph dated 09/12/2017 FINDINGS: CTA CHEST FINDINGS Cardiovascular: There is mild cardiomegaly with left ventricular hypertrophy. No pericardial effusion. The thoracic aorta is unremarkable. No aneurysmal dilatation or dissection. The origins of the great vessels of the aortic arch appear patent as visualized. There is no CT evidence of pulmonary embolism. Mediastinum/Nodes: No hilar or mediastinal adenopathy. Esophagus is grossly unremarkable as visualized. No mediastinal fluid collection. Lungs/Pleura: Left lung base linear atelectasis/scarring. Mild mosaic appearance of the lung bases, likely related to air trapping. There is a 3 mm left upper lobe nodule (series 8, image 51). There is no focal consolidation, pleural effusion, or pneumothorax. The central airways are patent. Musculoskeletal: No chest wall abnormality. No acute or significant osseous findings. Review of the MIP images confirms the above findings. CTA ABDOMEN AND PELVIS FINDINGS VASCULAR Aorta: Normal caliber aorta without aneurysm, dissection, vasculitis or significant stenosis. Celiac: Patent without evidence of aneurysm, dissection, vasculitis or significant stenosis. SMA: Patent without evidence of aneurysm, dissection, vasculitis or significant stenosis. Renals: Both renal arteries are patent without evidence of aneurysm, dissection, vasculitis, fibromuscular dysplasia or significant stenosis. IMA: Patent without evidence of aneurysm, dissection, vasculitis or significant stenosis. Inflow: Patent without evidence of aneurysm, dissection, vasculitis or significant stenosis. Veins: No obvious venous abnormality within the limitations of this arterial phase study. Review of the  MIP images confirms the above findings. NON-VASCULAR No intra-abdominal free air or free fluid. Hepatobiliary: Cholecystectomy.  The liver is unremarkable. Pancreas: Unremarkable. No pancreatic ductal dilatation or surrounding inflammatory changes. Spleen: Normal in size without focal abnormality. Adrenals/Urinary Tract: Adrenal glands are unremarkable. Kidneys are normal, without renal calculi, focal lesion, or hydronephrosis. Bladder is unremarkable. Stomach/Bowel: Stomach is within normal limits. Appendix appears normal. No evidence of bowel wall thickening, distention, or inflammatory changes. Lymphatic: No adenopathy. Reproductive: The uterus and ovaries are grossly unremarkable as visualized. Other: None Musculoskeletal: Multilevel degenerative disc disease. No acute osseous pathology. Review of the MIP images confirms the above findings. IMPRESSION: 1. No acute intrathoracic, abdominal, or pelvic pathology. No aortic aneurysm or dissection. No CT evidence of pulmonary embolus. 2. Cardiomegaly with left ventricular hypertrophy. Electronically Signed   By: Anner Crete M.D.   On: 09/13/2017 00:16    Lab Data:  CBC: Recent Labs  Lab 09/12/17 1430 09/13/17 0344 09/14/17 0616 09/15/17 0623  WBC 5.6 5.7 5.7 5.0  HGB 12.8  12.1 12.7 12.5  HCT 39.5 38.0 40.2 39.5  MCV 89.4 88.6 89.5 90.0  PLT 278 265 272 413   Basic Metabolic Panel: Recent Labs  Lab 09/12/17 1430 09/13/17 0344 09/14/17 0616 09/15/17 0633  NA 141 139 137 135  K 3.5 3.6 3.4* 3.5  CL 108 104 103 103  CO2 23 24 26 23   GLUCOSE 117* 106* 99 93  BUN 14 19 20  30*  CREATININE 1.31* 1.26* 1.58* 1.58*  CALCIUM 9.2 8.8* 8.8* 8.6*   GFR: Estimated Creatinine Clearance: 46.8 mL/min (A) (by C-G formula based on SCr of 1.58 mg/dL (H)). Liver Function Tests: No results for input(s): AST, ALT, ALKPHOS, BILITOT, PROT, ALBUMIN in the last 168 hours. No results for input(s): LIPASE, AMYLASE in the last 168 hours. No results for  input(s): AMMONIA in the last 168 hours. Coagulation Profile: No results for input(s): INR, PROTIME in the last 168 hours. Cardiac Enzymes: Recent Labs  Lab 09/12/17 2320 09/13/17 0344 09/13/17 1010  TROPONINI <0.03 <0.03 <0.03   BNP (last 3 results) No results for input(s): PROBNP in the last 8760 hours. HbA1C: No results for input(s): HGBA1C in the last 72 hours. CBG: No results for input(s): GLUCAP in the last 168 hours. Lipid Profile: No results for input(s): CHOL, HDL, LDLCALC, TRIG, CHOLHDL, LDLDIRECT in the last 72 hours. Thyroid Function Tests: No results for input(s): TSH, T4TOTAL, FREET4, T3FREE, THYROIDAB in the last 72 hours. Anemia Panel: No results for input(s): VITAMINB12, FOLATE, FERRITIN, TIBC, IRON, RETICCTPCT in the last 72 hours. Urine analysis:    Component Value Date/Time   COLORURINE YELLOW 03/18/2009 0617   APPEARANCEUR CLEAR 03/18/2009 0617   LABSPEC 1.025 12/02/2011 1924   PHURINE 5.5 12/02/2011 1924   GLUCOSEU NEGATIVE 12/02/2011 1924   HGBUR LARGE (A) 12/02/2011 1924   BILIRUBINUR NEGATIVE 12/02/2011 1924   KETONESUR NEGATIVE 12/02/2011 1924   PROTEINUR NEGATIVE 12/02/2011 1924   UROBILINOGEN 0.2 12/02/2011 1924   NITRITE NEGATIVE 12/02/2011 1924   LEUKOCYTESUR SMALL (A) 12/02/2011 1924     Ripudeep Rai M.D. [facility] 09/15/2017, 2:12 PM  Pager: [phone] Between 7am to 7pm - call Pager - [phone]  After 7pm go to [email or web address] - password [email or web address]  Call night coverage person covering after 7pm

## 2017-09-15 NOTE — Progress Notes (Addendum)
PT Cancellation Note  Patient Details Name: Angela Gardner MRN: [id number] DOB: 12-14-1960   Cancelled Treatment:    Reason Eval/Treat Not Completed: Patient at procedure or test/unavailable. Pt off of the floor for x-ray per floor staff. PT will continue to f/u with pt as available.    [place] 09/15/2017, 9:25 AM

## 2017-09-16 LAB — BASIC METABOLIC PANEL
ANION GAP: 10 (ref 5–15)
BUN: 35 mg/dL — AB (ref 6–20)
CO2: 21 mmol/L — ABNORMAL LOW (ref 22–32)
Calcium: 9.1 mg/dL (ref 8.9–10.3)
Chloride: 109 mmol/L (ref 101–111)
Creatinine, Ser: 1.52 mg/dL — ABNORMAL HIGH (ref 0.44–1.00)
GFR, EST AFRICAN AMERICAN: 43 mL/min — AB (ref >60)
GFR, EST NON AFRICAN AMERICAN: 38 mL/min — AB (ref >60)
Glucose, Bld: 99 mg/dL (ref 65–99)
POTASSIUM: 4 mmol/L (ref 3.5–5.1)
Sodium: 140 mmol/L (ref 135–145)

## 2017-09-16 LAB — CBC
HCT: 40.2 % (ref 36.0–46.0)
Hemoglobin: 12.8 g/dL (ref 12.0–15.0)
MCH: 28.4 pg (ref 26.0–34.0)
MCHC: 31.8 g/dL (ref 30.0–36.0)
MCV: 89.1 fL (ref 78.0–100.0)
PLATELETS: 282 10*3/uL (ref 150–400)
RBC: 4.51 MIL/uL (ref 3.87–5.11)
RDW: 14.8 % (ref 11.5–15.5)
WBC: 6.7 10*3/uL (ref 4.0–10.5)

## 2017-09-16 LAB — FERRITIN: FERRITIN: 38 ng/mL (ref 11–307)

## 2017-09-16 MED ORDER — HYDRALAZINE HCL 25 MG PO TABS: 25.0000 mg | ORAL_TABLET | Freq: Two times a day (BID) | ORAL | Status: DC

## 2017-09-16 MED ORDER — HYDRALAZINE HCL 10 MG PO TABS
10.0000 mg | ORAL_TABLET | Freq: Two times a day (BID) | ORAL | Status: DC
  Administered 2017-09-16: 10 mg via ORAL
  Filled 2017-09-16: qty 1

## 2017-09-16 MED ORDER — METOPROLOL TARTRATE 25 MG PO TABS: 25.0000 mg | ORAL_TABLET | Freq: Two times a day (BID) | ORAL | 5 refills | Status: DC

## 2017-09-16 MED ORDER — FUROSEMIDE 40 MG PO TABS: 40.0000 mg | ORAL_TABLET | Freq: Every day | ORAL | 5 refills | Status: DC

## 2017-09-16 MED ORDER — MEGESTROL ACETATE 40 MG PO TABS: 40.0000 mg | ORAL_TABLET | Freq: Two times a day (BID) | ORAL | 3 refills | Status: DC

## 2017-09-16 MED ORDER — HYDRALAZINE HCL 10 MG PO TABS: 10.0000 mg | ORAL_TABLET | Freq: Two times a day (BID) | ORAL | 5 refills | Status: DC

## 2017-09-16 NOTE — Discharge Summary (Signed)
Physician Discharge Summary   Patient ID: Angela Gardner MRN: [id number] DOB/AGE: 07-22-1960 57 y.o.  Admit date: 09/12/2017 Discharge date: 09/16/2017  Primary Care Physician:  System, Pcp Not In   Recommendations for Outpatient Follow-up:  1. Follow up with PCP in 1-2 weeks 2. Please obtain BMP/CBC in one week  Home Health: None Equipment/Devices: none   Discharge Condition: stable  CODE STATUS: FULL  Diet recommendation: Low-salt heart healthy diet   Discharge Diagnoses:   Acute diastolic CHF exacerbation . Hypertensive crisis . Dysfunctional uterine bleeding . Chest discomfort Mild acute kidney injury   Consults: OB/GYN, Dr. Elly Modena via phone consultation    Allergies:   Allergies  Allergen Reactions  . Vicodin [Hydrocodone-Acetaminophen] Itching     DISCHARGE MEDICATIONS: Allergies as of 09/16/2017      Reactions   Vicodin [hydrocodone-acetaminophen] Itching      Medication List    STOP taking these medications   ibuprofen 200 MG tablet Commonly known as:  ADVIL,MOTRIN     TAKE these medications   furosemide 40 MG tablet Commonly known as:  LASIX Take 1 tablet (40 mg total) by mouth daily.   hydrALAZINE 10 MG tablet Commonly known as:  APRESOLINE Take 1 tablet (10 mg total) by mouth 2 (two) times daily.   megestrol 40 MG tablet Commonly known as:  MEGACE Take 1 tablet (40 mg total) by mouth 2 (two) times daily.   metoprolol tartrate 25 MG tablet Commonly known as:  LOPRESSOR Take 1 tablet (25 mg total) by mouth 2 (two) times daily.        Brief H and P: For complete details please refer to admission H and P, but in brief Patient is a 57 year old female with hypertension, BUE, presented to ED with dyspnea, chest discomfort and bilateral leg edema.  Patient reported that she had not taken her antihypertensives over a month or more.  She had noted progressive insidious segment of exertional dyspnea and peripheral edema over a month.  In  ED BP was as high as 260/136, EKG showed inferolateral T wave abnormality.  Chest x-ray with pulmonary vascular congestion, BNP 258.  Patient was given Lasix 40 mg IV x1 and started on nitroglycerin infusion. CTA chest negative for PE, aortic aneurysm or dissection.    Hospital Course:   Acute diastolic CHF (congestive heart failure) (HCC) likely precipitated due to hypertensive crisis -BP 260/136 at the time of admission with elevated BNP, peripheral edema, pulmonary vascular congestion on chest x-ray.Troponins x3 neg. mild T wave inversions in 1 aVL and V6 -Patient was placed on IV Lasix for diuresis, held yesterday as creatinine trended up to 1.5. -Continue oral Lasix 40 mg daily, beta-blocker, added hydralazine 10 mg twice a day  -2D echo showed EF of 41%, grade 2 diastolic dysfunction no wall motion abnormalities -Creatinine remains stable at 1.5, recommended outpatient labs BMET in 1 week     Hypertensive crisis -Due to noncompliance, patient states she ran out of antihypertensives, reviewed her medication list she was only on clonidine 0.2 mg twice a day which is not appropriate first-line medication -Continue Lasix, beta-blocker, added hydralazine 10 mg twice a day -Patient recommended to be compliant with her medications, medications available from [facility] $4 list    Mild acute kidney injury:  likely due to poor renal perfusion from acute CHF and hypertensive crisis. No history of CKD.  -Creatinine stable at 1.5, continue oral Lasix, repeat BMET in 1 week  Intermittent dysfunctional uterine bleeding/menorrhagia -Patient had a TV  ultrasound in 12/16 which had shown severe endometrial thickening, large echogenic focus in the endometrial canal, underwent biopsy which was negative for malignancy. Patient states that she has not followed by OB/GYN since then, recommended to follow-up after discharge. - Patient reports that she has been having-DUB for last 6 months and she is  miserable.  Trans-vaginal and pelvic ultrasound showed large echogenic mass with a endometrium measuring 3.8x 3.8x 3.6 cm and a 3.8 cm fundal subserosal fibroid.  Similar findings in the prior ultrasound and biopsy was negative -Discussed with OB/GYN on call, Dr. Elly Modena on 3/29, recommended Megace 40 mg twice a day until she follows up with OB/GYN.  Recommended that patient call [phone] and schedule appointment ASAP after discharge. -Iron profile showed iron 42, TIBC 335, ferritin 38     Day of Discharge S: Feeling better today, eager to go home  BP (!) 145/66   Pulse 64   Temp 98.2 F (36.8 C) (Oral)   Resp 20   Ht 5\' 4"  (1.626 m)   Wt 103 kg (227 lb)   SpO2 99%   BMI 38.96 kg/m   Physical Exam: General: Alert and awake oriented x3 not in any acute distress. HEENT: anicteric sclera, pupils reactive to light and accommodation CVS: S1-S2 clear no murmur rubs or gallops Chest: clear to auscultation bilaterally, no wheezing rales or rhonchi Abdomen: soft nontender, nondistended, normal bowel sounds Extremities: no cyanosis, clubbing or edema noted bilaterally Neuro: Cranial nerves II-XII intact, no focal neurological deficits   The results of significant diagnostics from this hospitalization (including imaging, microbiology, ancillary and laboratory) are listed below for reference.      Procedures/Studies: - Left ventricle: The cavity size was normal. Wall thickness was   increased in a pattern of severe LVH. The estimated ejection   fraction was 55%. Wall motion was normal; there were no regional   wall motion abnormalities. Features are consistent with a   pseudonormal left ventricular filling pattern, with concomitant   abnormal relaxation and increased filling pressure (grade 2   diastolic dysfunction). - Aortic valve: There was no stenosis. - Aorta: Ascending aortic diameter: 36 mm (S). - Ascending aorta: The ascending aorta was mildly dilated. - Mitral valve: There  was trivial regurgitation. - Left atrium: The atrium was moderately dilated. - Right ventricle: The cavity size was normal. Systolic function   was normal. - Right atrium: The atrium was mildly dilated. - Tricuspid valve: Peak RV-RA gradient (S): 27 mm Hg. - Pulmonary arteries: PA peak pressure: 30 mm Hg (S). - Inferior vena cava: The vessel was normal in size. The   respirophasic diameter changes were in the normal range (= 50%),   consistent with normal central venous pressure.  Dg Chest 2 View  Result Date: 09/12/2017 CLINICAL DATA:  Shortness of breath. Heaviness in chest. History of left breast mass. EXAM: CHEST - 2 VIEW COMPARISON:  12/02/2011. FINDINGS: Mediastinum hilar structures normal. Cardiomegaly. Mild pulmonary venous congestion. Mild bilateral interstitial prominence. No focal infiltrate. No pleural effusion or pneumothorax. Degenerative change thoracic spine. Surgical clips right upper abdomen. IMPRESSION: Cardiomegaly. Mild pulmonary venous congestion. Mild bilateral pulmonary interstitial prominence. Mild CHF may be present. Electronically Signed   By: Marcello Moores  Register   On: 09/12/2017 15:11   US Pelvic Complete With Transvaginal  Result Date: 09/15/2017 CLINICAL DATA:  Menorrhagia for 6 months EXAM: TRANSABDOMINAL AND TRANSVAGINAL ULTRASOUND OF PELVIS TECHNIQUE: Both transabdominal and transvaginal ultrasound examinations of the pelvis were performed. Transabdominal technique was performed for  global imaging of the pelvis including uterus, ovaries, adnexal regions, and pelvic cul-de-sac. It was necessary to proceed with endovaginal exam following the transabdominal exam to visualize the endometrium. COMPARISON:  None FINDINGS: Uterus Measurements: 11.2 x 5.0 x 6.9 cm. Subserosal fundal fibroid measures up to 3.8 cm. Endometrium Thickness: 7 mm in thickness. Concern for echogenic mass centrally within the endometrium measuring 3.8 x 3.8 x 3.6 cm. Small amount of fluid within the  endometrial canal Right ovary Measurements: 2.5 x 1.8 x 1.7 cm. Normal appearance/no adnexal mass. Left ovary Measurements: 4.2 x 3.4 x 2.1 cm. Normal appearance/no adnexal mass. Other findings No abnormal free fluid. IMPRESSION: Concern for large echogenic mass within the endometrium measuring 3.8 x 3.8 x 3.6 cm. Recommend further evaluation with sonohysterogram or hysteroscopy/biopsy. 3.8 cm fundal subserosal fibroid. Electronically Signed   By: Rolm Baptise M.D.   On: 09/15/2017 10:46   Ct Angio Chest/abd/pel For Dissection W And/or W/wo  Result Date: 09/13/2017 CLINICAL DATA:  57 year old female with shortness of breath and dyspnea. Chest pain and leg swelling. EXAM: CT ANGIOGRAPHY CHEST, ABDOMEN AND PELVIS TECHNIQUE: Multidetector CT imaging through the chest, abdomen and pelvis was performed using the standard protocol during bolus administration of intravenous contrast. Multiplanar reconstructed images and MIPs were obtained and reviewed to evaluate the vascular anatomy. CONTRAST:  191mL ISOVUE-370 IOPAMIDOL (ISOVUE-370) INJECTION 76% COMPARISON:  Chest radiograph dated 09/12/2017 FINDINGS: CTA CHEST FINDINGS Cardiovascular: There is mild cardiomegaly with left ventricular hypertrophy. No pericardial effusion. The thoracic aorta is unremarkable. No aneurysmal dilatation or dissection. The origins of the great vessels of the aortic arch appear patent as visualized. There is no CT evidence of pulmonary embolism. Mediastinum/Nodes: No hilar or mediastinal adenopathy. Esophagus is grossly unremarkable as visualized. No mediastinal fluid collection. Lungs/Pleura: Left lung base linear atelectasis/scarring. Mild mosaic appearance of the lung bases, likely related to air trapping. There is a 3 mm left upper lobe nodule (series 8, image 51). There is no focal consolidation, pleural effusion, or pneumothorax. The central airways are patent. Musculoskeletal: No chest wall abnormality. No acute or significant  osseous findings. Review of the MIP images confirms the above findings. CTA ABDOMEN AND PELVIS FINDINGS VASCULAR Aorta: Normal caliber aorta without aneurysm, dissection, vasculitis or significant stenosis. Celiac: Patent without evidence of aneurysm, dissection, vasculitis or significant stenosis. SMA: Patent without evidence of aneurysm, dissection, vasculitis or significant stenosis. Renals: Both renal arteries are patent without evidence of aneurysm, dissection, vasculitis, fibromuscular dysplasia or significant stenosis. IMA: Patent without evidence of aneurysm, dissection, vasculitis or significant stenosis. Inflow: Patent without evidence of aneurysm, dissection, vasculitis or significant stenosis. Veins: No obvious venous abnormality within the limitations of this arterial phase study. Review of the MIP images confirms the above findings. NON-VASCULAR No intra-abdominal free air or free fluid. Hepatobiliary: Cholecystectomy.  The liver is unremarkable. Pancreas: Unremarkable. No pancreatic ductal dilatation or surrounding inflammatory changes. Spleen: Normal in size without focal abnormality. Adrenals/Urinary Tract: Adrenal glands are unremarkable. Kidneys are normal, without renal calculi, focal lesion, or hydronephrosis. Bladder is unremarkable. Stomach/Bowel: Stomach is within normal limits. Appendix appears normal. No evidence of bowel wall thickening, distention, or inflammatory changes. Lymphatic: No adenopathy. Reproductive: The uterus and ovaries are grossly unremarkable as visualized. Other: None Musculoskeletal: Multilevel degenerative disc disease. No acute osseous pathology. Review of the MIP images confirms the above findings. IMPRESSION: 1. No acute intrathoracic, abdominal, or pelvic pathology. No aortic aneurysm or dissection. No CT evidence of pulmonary embolus. 2. Cardiomegaly with left ventricular hypertrophy. Electronically  Signed   By: Anner Crete M.D.   On: 09/13/2017 00:16        LAB RESULTS: Basic Metabolic Panel: Recent Labs  Lab 09/15/17 0633 09/16/17 0213  NA 135 140  K 3.5 4.0  CL 103 109  CO2 23 21*  GLUCOSE 93 99  BUN 30* 35*  CREATININE 1.58* 1.52*  CALCIUM 8.6* 9.1   Liver Function Tests: No results for input(s): AST, ALT, ALKPHOS, BILITOT, PROT, ALBUMIN in the last 168 hours. No results for input(s): LIPASE, AMYLASE in the last 168 hours. No results for input(s): AMMONIA in the last 168 hours. CBC: Recent Labs  Lab 09/15/17 0623 09/16/17 0213  WBC 5.0 6.7  HGB 12.5 12.8  HCT 39.5 40.2  MCV 90.0 89.1  PLT 266 282   Cardiac Enzymes: Recent Labs  Lab 09/13/17 0344 09/13/17 1010  TROPONINI <0.03 <0.03   BNP: Invalid input(s): POCBNP CBG: No results for input(s): GLUCAP in the last 168 hours.    Disposition and Follow-up: Discharge Instructions    (HEART FAILURE PATIENTS) Call MD:  Anytime you have any of the following symptoms: 1) 3 pound weight gain in 24 hours or 5 pounds in 1 week 2) shortness of breath, with or without a dry hacking cough 3) swelling in the hands, feet or stomach 4) if you have to sleep on extra pillows at night in order to breathe.   Complete by:  As directed    Diet - low sodium heart healthy   Complete by:  As directed    Discharge instructions   Complete by:  As directed    Please follow-up with OBGYN at [facility] with in next 1-2 weeks. You need labs BMET in 7 days. Please note all BP medications are available cheaper at [facility].   Increase activity slowly   Complete by:  As directed        DISPOSITION:  [place]. Schedule an appointment as soon as possible for a visit in 2 week(s).   Why:  Please call [phone] to schedule appointment asap Contact information: [place] [address] [place].  Schedule an appointment as soon as possible for a visit in 2 week(s).   Contact information: [address] [place] [address] [phone]           Time coordinating discharge:  35mins   Signed:   Estill Cotta M.D. [facility] 09/16/2017, 11:12 AM Pager: [phone]

## 2017-09-16 NOTE — Care Management Note (Signed)
Case Management Note  Patient Details  Name: Angela Gardner MRN: [id number] Date of Birth: [birth year]  Subjective/Objective:  Pt presented for Acute CHF- PTA from home without Insurance at this time. Plan will be to d/c home today. Sister to provide transportation home. Consult received for PCP/ F/u appointment: Unable to schedule 2/2 weekend and office closed.                 Action/Plan:  CM did call [employer] to price medications:  furosemide  $4.00 . hydrALAZINE $5.87 . megestrol $36.34 . metoprolol  tartrate $15.25 -pt did speak with her sister and she will be able to help with  cost of medications. CM did stress to patient to call the [facility] on Monday  to schedule an appointment. Pt will then be able to utilize the [facility]armacy for medications that range in cost from $4.00-$10.00. [name]- No further needs from CM @ this time.    Expected Discharge Date:  09/16/17               Expected Discharge Plan:  Home/Self Care  In-House Referral:  NA  Discharge planning Services  CM Consult, Medication Assistance, [facility]  Post Acute Care Choice:  NA Choice offered to:  NA  DME Arranged:  N/A DME Agency:  NA  HH Arranged:  NA HH Agency:  NA  Status of Service:  Completed, signed off  If discussed at [place] of Stay Meetings, dates discussed:    Additional Comments:  [name], RN 09/16/2017, 9:36 AM

## 2017-10-09 ENCOUNTER — Encounter: Payer: Self-pay | Admitting: Obstetrics & Gynecology

## 2017-10-09 ENCOUNTER — Other Ambulatory Visit (HOSPITAL_COMMUNITY)
Admission: RE | Admit: 2017-10-09 | Discharge: 2017-10-09 | Disposition: A | Discharge status: Home / Self Care | Payer: PRIVATE HEALTH INSURANCE | Source: Ambulatory Visit | Attending: Obstetrics & Gynecology | Admitting: Obstetrics & Gynecology

## 2017-10-09 ENCOUNTER — Ambulatory Visit (INDEPENDENT_AMBULATORY_CARE_PROVIDER_SITE_OTHER): Payer: PRIVATE HEALTH INSURANCE | Admitting: Obstetrics & Gynecology

## 2017-10-09 VITALS — BP 194/89 | HR 52 | Wt 230.8 lb

## 2017-10-09 DIAGNOSIS — N950 Postmenopausal bleeding: Secondary | ICD-10-CM

## 2017-10-09 DIAGNOSIS — Z124 Encounter for screening for malignant neoplasm of cervix: Secondary | ICD-10-CM | POA: Diagnosis not present

## 2017-10-09 DIAGNOSIS — R87810 Cervical high risk human papillomavirus (HPV) DNA test positive: Secondary | ICD-10-CM

## 2017-10-09 DIAGNOSIS — Z1151 Encounter for screening for human papillomavirus (HPV): Secondary | ICD-10-CM

## 2017-10-09 DIAGNOSIS — C541 Malignant neoplasm of endometrium: Secondary | ICD-10-CM

## 2017-10-09 HISTORY — DX: Morbid (severe) obesity due to excess calories: E66.01

## 2017-10-09 NOTE — Progress Notes (Signed)
Patient ID: Angela Gardner, female   DOB: 04/08/1961, 57 y.o.   MRN: [id number]  Chief Complaint  Patient presents with  . Vaginal Bleeding    HPI Angela Gardner is a 57 y.o. female. P1  She is here for follow up after going to the ER with PMB for 7 months. She was started on Megace which has helped. She had an EMBX here 2 years ago which was negative. HPI  Past Medical History:  Diagnosis Date  . Anemia   . 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 Hx of left breast biopsy   . Hyperlipemia   . Hypertension    not on any medication now-[facility] had  prescribed her a med and she stopped taking them cannot remember when    Past Surgical History:  Procedure Laterality Date  . BREAST BIOPSY    . CESAREAN SECTION     one  . CHOLECYSTECTOMY    . DILATION AND CURETTAGE OF UTERUS     approx 1 year ago    Family History  Problem Relation Age of Onset  . Anuerysm Mother     Social History Social History   Tobacco Use  . Smoking status: Current Every Day Smoker    Packs/day: 0.30    Years: 26.00    Pack years: 7.80    Types: Cigarettes  . Smokeless tobacco: Never Used  Substance Use Topics  . Alcohol use: Yes    Comment: social drinking  . Drug use: Yes    Types: Cocaine    Allergies  Allergen Reactions  . Vicodin [Hydrocodone-Acetaminophen] Itching    Current Outpatient Medications  Medication Sig Dispense Refill  . furosemide (LASIX) 40 MG tablet Take 1 tablet (40 mg total) by mouth daily. 30 tablet 5  . hydrALAZINE (APRESOLINE) 10 MG tablet Take 1 tablet (10 mg total) by mouth 2 (two) times daily. 60 tablet 5  . megestrol (MEGACE) 40 MG tablet Take 1 tablet (40 mg total) by mouth 2 (two) times daily. 60 tablet 3  . metoprolol tartrate (LOPRESSOR) 25 MG tablet Take 1 tablet (25 mg total) by mouth 2 (two) times daily. 60 tablet 5   No current facility-administered medications for this visit.     Review of Systems Review of Systems  Blood pressure (!)  194/89, pulse (!) 52, weight 230 lb 12.8 oz (104.7 kg).  Physical Exam Physical Exam Breathing, conversing, and ambulating normally Well nourished, well hydrated Black female, no apparent distress   UPT negative, consent signed, time out done Cervix prepped with betadine and grasped with a single tooth tenaculum Uterus sounded to 12 cm Pipelle used for 5passes with a moderate amount of tissue obtained. She tolerated the procedure well.    Data Reviewed IMPRESSION: Concern for large echogenic mass within the endometrium measuring 3.8 x 3.8 x 3.6 cm. Recommend further evaluation with sonohysterogram or hysteroscopy/biopsy.  3.8 cm fundal subserosal fibroid.   Electronically Signed   By: Rolm Baptise M.D.   On: 09/15/2017 10:46  Assessment    PMB- await pathology    Plan    Come back 1 week       Angela Gardner 10/09/2017, 10:03 AM

## 2017-10-09 NOTE — Addendum Note (Signed)
Addended by: Riccardo Dubin on: 10/09/2017 10:36 AM   Modules accepted: Orders

## 2017-10-09 NOTE — Progress Notes (Signed)
Pt has been bleeding for 7 months. Is on Megace 40mg  bid, states that this has "calmed it down" but she is still bleeding.

## 2017-10-11 ENCOUNTER — Encounter: Payer: Self-pay | Admitting: *Deleted

## 2017-10-11 ENCOUNTER — Telehealth: Payer: Self-pay | Admitting: General Practice

## 2017-10-11 NOTE — Telephone Encounter (Signed)
Patient's appt was moved up from 5/3 to 4/26 due to needing to discuss pathology results. Called patient, no answer- left message to call us back as we have had a cancellation and your appt has been moved up. Please call us back.

## 2017-10-12 LAB — CYTOLOGY - PAP
DIAGNOSIS: NEGATIVE
HPV 16/18/45 GENOTYPING: NEGATIVE
HPV: DETECTED — AB

## 2017-10-12 NOTE — Telephone Encounter (Signed)
Called patient & informed her of change in appt. Patient verbalized understanding & had no questions.

## 2017-10-13 ENCOUNTER — Ambulatory Visit (INDEPENDENT_AMBULATORY_CARE_PROVIDER_SITE_OTHER): Payer: PRIVATE HEALTH INSURANCE | Admitting: Obstetrics & Gynecology

## 2017-10-13 ENCOUNTER — Telehealth: Payer: Self-pay | Admitting: *Deleted

## 2017-10-13 ENCOUNTER — Encounter: Payer: Self-pay | Admitting: Obstetrics & Gynecology

## 2017-10-13 VITALS — BP 161/83 | HR 46 | Wt 228.1 lb

## 2017-10-13 DIAGNOSIS — C541 Malignant neoplasm of endometrium: Secondary | ICD-10-CM

## 2017-10-13 MED ORDER — METRONIDAZOLE 500 MG PO TABS
ORAL_TABLET | ORAL | 0 refills | Status: DC
Start: 2017-10-13 — End: 2017-10-27

## 2017-10-13 NOTE — Telephone Encounter (Signed)
Angela Gardner from Dr.Dove's office called and referred the patient. Appt given for May 2nd and 9am with Dr. Denman George

## 2017-10-13 NOTE — Progress Notes (Signed)
GYN ONC appt scheduled for May 2nd @ 0900.  Pt notified.

## 2017-10-13 NOTE — Progress Notes (Signed)
   Subjective:    Patient ID: Angela Gardner, female    DOB: [birth year], 57 y.o.   MRN: [id number]  HPI 57 yo single P1 here for results of her EMBX and pap smear.    Review of Systems     Objective:   Physical Exam  Pleasant morbidly obese black female Breathing, conversing, and ambulating normally Her pathology showed endometrial cancer and her pap showed trich      Assessment & Plan:  Trich- treat with flagyl 2 gram, partner to get treated by his provider Uterine cancer- refer to gyn onc

## 2017-10-19 ENCOUNTER — Encounter: Payer: Self-pay | Admitting: Oncology

## 2017-10-19 ENCOUNTER — Encounter: Payer: Self-pay | Admitting: Gynecologic Oncology

## 2017-10-19 ENCOUNTER — Inpatient Hospital Stay: Payer: PRIVATE HEALTH INSURANCE | Attending: Gynecologic Oncology | Admitting: Gynecologic Oncology

## 2017-10-19 VITALS — BP 162/81 | HR 59 | Temp 98.9°F | Resp 18 | Ht 64.0 in | Wt 226.8 lb

## 2017-10-19 DIAGNOSIS — E669 Obesity, unspecified: Secondary | ICD-10-CM | POA: Insufficient documentation

## 2017-10-19 DIAGNOSIS — F17210 Nicotine dependence, cigarettes, uncomplicated: Secondary | ICD-10-CM | POA: Insufficient documentation

## 2017-10-19 DIAGNOSIS — C541 Malignant neoplasm of endometrium: Secondary | ICD-10-CM

## 2017-10-19 DIAGNOSIS — E7800 Pure hypercholesterolemia, unspecified: Secondary | ICD-10-CM | POA: Insufficient documentation

## 2017-10-19 DIAGNOSIS — D259 Leiomyoma of uterus, unspecified: Secondary | ICD-10-CM | POA: Insufficient documentation

## 2017-10-19 DIAGNOSIS — Z79899 Other long term (current) drug therapy: Secondary | ICD-10-CM | POA: Insufficient documentation

## 2017-10-19 DIAGNOSIS — I10 Essential (primary) hypertension: Secondary | ICD-10-CM | POA: Insufficient documentation

## 2017-10-19 HISTORY — DX: Malignant neoplasm of endometrium: C54.1

## 2017-10-19 NOTE — H&P (View-Only) (Signed)
Consult Note: Gyn-Onc  Consult was requested by Dr. Hulan Fray for the evaluation of Angela Gardner 57 y.o. female  CC:  Chief Complaint  Patient presents with  . Endometrial cancer [facility])    Assessment/Plan:  Angela Gardner  is a 57 y.o.  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  A detailed discussion was held with the patient and her family with regard to to her endometrial cancer diagnosis. We discussed the standard management options for uterine cancer which includes surgery followed possibly by adjuvant therapy depending on the results of surgery. The options for surgical management include a hysterectomy and removal of the tubes and ovaries possibly with removal of pelvic and para-aortic lymph nodes.If feasible, a minimally invasive approach including a robotic hysterectomy or laparoscopic hysterectomy have benefits including shorter hospital stay, recovery time and better wound healing than with open surgery. The patient has been counseled about these surgical options and the risks of surgery in general including infection, bleeding, damage to surrounding structures (including bowel, bladder, ureters, nerves or vessels), and the postoperative risks of PE/ DVT, and lymphedema. I extensively reviewed the additional risks of robotic hysterectomy including possible need for conversion to open laparotomy.  I discussed positioning during surgery of trendelenberg and risks of minor facial swelling and care we take in preoperative positioning.  After counseling and consideration of her options, she desires to proceed with robotic assisted total hysterectomy with bilateral sapingo-oophorectomy and SLN biopsy.   She will be seen by anesthesia for preoperative clearance and discussion of postoperative pain management.  She was given the opportunity to ask questions, which were answered to her satisfaction, and she is agreement with the above mentioned  plan of care.  I discussed that due to her high risk endometrial cancer, she is likely to require adjuvant therapy postoperatively to reduce risk of recurrence or to treat occult metastatic disease.   HPI: Angela Gardner is a 57 year old P1 who is seen in consultation at the request of Dr Hulan Fray for grade 3 endometrial cancer.   The patient transitioned through menopause prior to age 75.  She had an episode of postmenopausal bleeding in approximately 2017 at which time she had a D&C procedure revealing benign pathology.  She stopped vaginal bleeding until approximately November 2018 when she began having heavy menstrual-like bleeding and was started on Megace.  This helped some however bleeding persisted and therefore she underwent a transvaginal ultrasound scan on 09/15/2017 this revealed a uterus measuring 11.2 x 5 x 6.9 cm with a subserosal fundal fibroid measuring up to 3.8 cm.  The endometrial thickness was 7 mm with concern for an echogenic mass centrally within the endometrium measuring 3.8 x 3.8 x 3.6 cm.  She then underwent an endometrial Pipelle biopsy on 10/09/2017 which revealed high-grade adenocarcinoma.  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  Due to the finding of a high-grade endometrial cancer that she underwent a CT scan of the chest abdomen and pelvis on September 12, 2017 this revealed grossly unremarkable uterus and ovaries, no lymphadenopathy, no gross evidence of extrauterine disease or metastases.  The patient otherwise is obese with hypertension and hypercholesterolemia.  She has had one prior pregnancy which resulted in a cesarean delivery.  She has had an open cholecystectomy.  Current Meds:  Outpatient Encounter Medications as of 10/19/2017  Medication Sig  . furosemide (LASIX) 40 MG tablet Take  1 tablet (40 mg total) by mouth daily.  . hydrALAZINE (APRESOLINE) 10 MG tablet Take 1 tablet (10 mg total) by  mouth 2 (two) times daily.  . megestrol (MEGACE) 40 MG tablet Take 1 tablet (40 mg total) by mouth 2 (two) times daily.  . metoprolol tartrate (LOPRESSOR) 25 MG tablet Take 1 tablet (25 mg total) by mouth 2 (two) times daily.  . metroNIDAZOLE (FLAGYL) 500 MG tablet Take two tablets by mouth twice a day, for one day.  Or you can take all four tablets at once if you can tolerate it.   No facility-administered encounter medications on file as of 10/19/2017.     Allergy:  Allergies  Allergen Reactions  . Vicodin [Hydrocodone-Acetaminophen] Itching    Social Hx:   Social History   Socioeconomic History  . Marital status: Single    Spouse name: Not on file  . Number of children: Not on file  . Years of education: Not on file  . Highest education level: Not on file  Occupational History  . Not on file  Social Needs  . Financial resource strain: Not on file  . Food insecurity:    Worry: Not on file    Inability: Not on file  . Transportation needs:    Medical: Not on file    Non-medical: Not on file  Tobacco Use  . Smoking status: Current Every Day Smoker    Packs/day: 0.30    Years: 26.00    Pack years: 7.80    Types: Cigarettes  . Smokeless tobacco: Never Used  Substance and Sexual Activity  . Alcohol use: Yes    Comment: social drinking  . Drug use: Yes    Types: Cocaine  . Sexual activity: Not Currently    Birth control/protection: Post-menopausal  Lifestyle  . Physical activity:    Days per week: Not on file    Minutes per session: Not on file  . Stress: Not on file  Relationships  . Social connections:    Talks on phone: Not on file    Gets together: Not on file    Attends religious service: Not on file    Active member of club or organization: Not on file    Attends meetings of clubs or organizations: Not on file    Relationship status: Not on file  . Intimate partner violence:    Fear of current or ex partner: Not on file    Emotionally abused: Not on file     Physically abused: Not on file    Forced sexual activity: Not on file  Other Topics Concern  . Not on file  Social History Narrative  . Not on file    Past Surgical Hx:  Past Surgical History:  Procedure Laterality Date  . BREAST BIOPSY    . CESAREAN SECTION     one  . CHOLECYSTECTOMY    . DILATION AND CURETTAGE OF UTERUS     approx 1 year ago    Past Medical Hx:  Past Medical History:  Diagnosis Date  . Anemia   . 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 Hx of left breast biopsy   . Hyperlipemia   . Hypertension    not on any medication now-[facility] had  prescribed her a med and she stopped taking them cannot remember when    Past Gynecological History:  C/s x 1, positive high risk HPV in 2019. No LMP recorded (lmp unknown). (Menstrual status: Perimenopausal).  Family Hx:  Family History  Problem Relation Age of Onset  . Anuerysm Mother   . Congestive Heart Failure Mother     Review of Systems:  Constitutional  Feels well,    ENT Normal appearing ears and nares bilaterally Skin/Breast  No rash, sores, jaundice, itching, dryness Cardiovascular  No chest pain, shortness of breath, or edema  Pulmonary  No cough or wheeze.  Gastro Intestinal  No nausea, vomitting, or diarrhoea. No bright red blood per rectum, no abdominal pain, change in bowel movement, or constipation.  Genito Urinary  No frequency, urgency, dysuria, + bleeding Musculo Skeletal  No myalgia, arthralgia, joint swelling or pain  Neurologic  No weakness, numbness, change in gait,  Psychology  No depression, anxiety, insomnia.   Vitals:  Blood pressure (!) 162/81, pulse (!) 59, temperature 98.9 F (37.2 C), temperature source Oral, resp. rate 18, height 5\' 4"  (1.626 m), weight 226 lb 12.8 oz (102.9 kg), SpO2 98 %.  Physical Exam: WD in NAD Neck  Supple NROM, without any enlargements.  Lymph Node Survey No cervical supraclavicular or inguinal adenopathy Cardiovascular  Pulse  normal rate, regularity and rhythm. S1 and S2 normal.  Lungs  Clear to auscultation bilateraly, without wheezes/crackles/rhonchi. Good air movement.  Skin  No rash/lesions/breakdown  Psychiatry  Alert and oriented to person, place, and time  Abdomen  Normoactive bowel sounds, abdomen soft, non-tender and obese without evidence of hernia.  Back No CVA tenderness Genito Urinary  Vulva/vagina: Normal external female genitalia.  No lesions. No discharge or bleeding.  Bladder/urethra:  No lesions or masses, well supported bladder  Vagina: normal, no apparent lesions  Cervix: Normal appearing, no lesions.  Uterus:  Bulky (10-12), mobile, no parametrial involvement or nodularity.  Adnexa: no palpable masses. Rectal  deferred Extremities  No bilateral cyanosis, clubbing or edema.   Thereasa Solo, MD  10/19/2017, 9:52 AM

## 2017-10-19 NOTE — Patient Instructions (Signed)
Preparing for your Surgery  Plan for surgery on Oct 31, 2017 with Dr. Everitt Amber at [facility] will be scheduled for a robotic assisted total hysterectomy, bilateral salpingo-oophorectomy, sentinel lymph node biopsy.  Pre-operative Testing -You will receive a phone call from presurgical testing at [facility] to arrange for a pre-operative testing appointment before your surgery.  This appointment normally occurs one to two weeks before your scheduled surgery.   -Bring your insurance card, copy of an advanced directive if applicable, medication list  -At that visit, you will be asked to sign a consent for a possible blood transfusion in case a transfusion becomes necessary during surgery.  The need for a blood transfusion is rare but having consent is a necessary part of your care.     -You should not be taking blood thinners or aspirin at least ten days prior to surgery unless instructed by your surgeon.  Day Before Surgery at [place] will be asked to take in a light diet the day before surgery.  Avoid carbonated beverages.  You will be advised to have nothing to eat or drink after midnight the evening before.    Eat a light diet the day before surgery.  Examples including soups, broths, toast, yogurt, mashed potatoes.  Things to avoid include carbonated beverages (fizzy beverages), raw fruits and raw vegetables, or beans.   If your bowels are filled with gas, your surgeon will have difficulty visualizing your pelvic organs which increases your surgical risks.  Your role in recovery Your role is to become active as soon as directed by your doctor, while still giving yourself time to heal.  Rest when you feel tired. You will be asked to do the following in order to speed your recovery:  - Cough and breathe deeply. This helps toclear and expand your lungs and can prevent pneumonia. You may be given a spirometer to practice deep breathing. A staff member  will show you how to use the spirometer. - Do mild physical activity. Walking or moving your legs help your circulation and body functions return to normal. A staff member will help you when you try to walk and will provide you with simple exercises. Do not try to get up or walk alone the first time. - Actively manage your pain. Managing your pain lets you move in comfort. We will ask you to rate your pain on a scale of zero to 10. It is your responsibility to tell your doctor or nurse where and how much you hurt so your pain can be treated.  Special Considerations -If you are diabetic, you may be placed on insulin after surgery to have closer control over your blood sugars to promote healing and recovery.  This does not mean that you will be discharged on insulin.  If applicable, your oral antidiabetics will be resumed when you are tolerating a solid diet.  -Your final pathology results from surgery should be available by the Friday after surgery and the results will be relayed to you when available.  -Dr. Lahoma Crocker is the Surgeon that assists your GYN Oncologist with surgery.  The next day after your surgery you will either see your GYN Oncologist or Dr. Lahoma Crocker.   Blood Transfusion Information WHAT IS A BLOOD TRANSFUSION? A transfusion is the replacement of blood or some of its parts. Blood is made up of multiple cells which provide different functions.  Red blood cells carry oxygen and are used for blood  loss replacement.  White blood cells fight against infection.  Platelets control bleeding.  Plasma helps clot blood.  Other blood products are available for specialized needs, such as hemophilia or other clotting disorders. BEFORE THE TRANSFUSION  Who gives blood for transfusions?   You may be able to donate blood to be used at a later date on yourself (autologous donation).  Relatives can be asked to donate blood. This is generally not any safer than if you have  received blood from a stranger. The same precautions are taken to ensure safety when a relative's blood is donated.  Healthy volunteers who are fully evaluated to make sure their blood is safe. This is blood bank blood. Transfusion therapy is the safest it has ever been in the practice of medicine. Before blood is taken from a donor, a complete history is taken to make sure that person has no history of diseases nor engages in risky social behavior (examples are intravenous drug use or sexual activity with multiple partners). The donor's travel history is screened to minimize risk of transmitting infections, such as malaria. The donated blood is tested for signs of infectious diseases, such as HIV and hepatitis. The blood is then tested to be sure it is compatible with you in order to minimize the chance of a transfusion reaction. If you or a relative donates blood, this is often done in anticipation of surgery and is not appropriate for emergency situations. It takes many days to process the donated blood. RISKS AND COMPLICATIONS Although transfusion therapy is very safe and saves many lives, the main dangers of transfusion include:   Getting an infectious disease.  Developing a transfusion reaction. This is an allergic reaction to something in the blood you were given. Every precaution is taken to prevent this. The decision to have a blood transfusion has been considered carefully by your caregiver before blood is given. Blood is not given unless the benefits outweigh the risks.

## 2017-10-19 NOTE — Progress Notes (Signed)
Consult Note: Gyn-Onc  Consult was requested by Dr. Hulan Fray for the evaluation of Angela Gardner 57 y.o. female  CC:  Chief Complaint  Patient presents with  . Endometrial cancer [facility])    Assessment/Plan:  [name]  is a 57 y.o.  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  A detailed discussion was held with the patient and her family with regard to to her endometrial cancer diagnosis. We discussed the standard management options for uterine cancer which includes surgery followed possibly by adjuvant therapy depending on the results of surgery. The options for surgical management include a hysterectomy and removal of the tubes and ovaries possibly with removal of pelvic and para-aortic lymph nodes.If feasible, a minimally invasive approach including a robotic hysterectomy or laparoscopic hysterectomy have benefits including shorter hospital stay, recovery time and better wound healing than with open surgery. The patient has been counseled about these surgical options and the risks of surgery in general including infection, bleeding, damage to surrounding structures (including bowel, bladder, ureters, nerves or vessels), and the postoperative risks of PE/ DVT, and lymphedema. I extensively reviewed the additional risks of robotic hysterectomy including possible need for conversion to open laparotomy.  I discussed positioning during surgery of trendelenberg and risks of minor facial swelling and care we take in preoperative positioning.  After counseling and consideration of her options, she desires to proceed with robotic assisted total hysterectomy with bilateral sapingo-oophorectomy and SLN biopsy.   She will be seen by anesthesia for preoperative clearance and discussion of postoperative pain management.  She was given the opportunity to ask questions, which were answered to her satisfaction, and she is agreement with the above mentioned  plan of care.  I discussed that due to her high risk endometrial cancer, she is likely to require adjuvant therapy postoperatively to reduce risk of recurrence or to treat occult metastatic disease.   HPI: Angela Gardner is a 57 year old P1 who is seen in consultation at the request of Dr Hulan Fray for grade 3 endometrial cancer.   The patient transitioned through menopause prior to age 49.  She had an episode of postmenopausal bleeding in approximately 2017 at which time she had a D&C procedure revealing benign pathology.  She stopped vaginal bleeding until approximately November 2018 when she began having heavy menstrual-like bleeding and was started on Megace.  This helped some however bleeding persisted and therefore she underwent a transvaginal ultrasound scan on 09/15/2017 this revealed a uterus measuring 11.2 x 5 x 6.9 cm with a subserosal fundal fibroid measuring up to 3.8 cm.  The endometrial thickness was 7 mm with concern for an echogenic mass centrally within the endometrium measuring 3.8 x 3.8 x 3.6 cm.  She then underwent an endometrial Pipelle biopsy on 10/09/2017 which revealed high-grade adenocarcinoma.  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  Due to the finding of a high-grade endometrial cancer that she underwent a CT scan of the chest abdomen and pelvis on September 12, 2017 this revealed grossly unremarkable uterus and ovaries, no lymphadenopathy, no gross evidence of extrauterine disease or metastases.  The patient otherwise is obese with hypertension and hypercholesterolemia.  She has had one prior pregnancy which resulted in a cesarean delivery.  She has had an open cholecystectomy.  Current Meds:  Outpatient Encounter Medications as of 10/19/2017  Medication Sig  . furosemide (LASIX) 40 MG tablet Take  1 tablet (40 mg total) by mouth daily.  . hydrALAZINE (APRESOLINE) 10 MG tablet Take 1 tablet (10 mg total) by  mouth 2 (two) times daily.  . megestrol (MEGACE) 40 MG tablet Take 1 tablet (40 mg total) by mouth 2 (two) times daily.  . metoprolol tartrate (LOPRESSOR) 25 MG tablet Take 1 tablet (25 mg total) by mouth 2 (two) times daily.  . metroNIDAZOLE (FLAGYL) 500 MG tablet Take two tablets by mouth twice a day, for one day.  Or you can take all four tablets at once if you can tolerate it.   No facility-administered encounter medications on file as of 10/19/2017.     Allergy:  Allergies  Allergen Reactions  . Vicodin [Hydrocodone-Acetaminophen] Itching    Social Hx:   Social History   Socioeconomic History  . Marital status: Single    Spouse name: Not on file  . Number of children: Not on file  . Years of education: Not on file  . Highest education level: Not on file  Occupational History  . Not on file  Social Needs  . Financial resource strain: Not on file  . Food insecurity:    Worry: Not on file    Inability: Not on file  . Transportation needs:    Medical: Not on file    Non-medical: Not on file  Tobacco Use  . Smoking status: Current Every Day Smoker    Packs/day: 0.30    Years: 26.00    Pack years: 7.80    Types: Cigarettes  . Smokeless tobacco: Never Used  Substance and Sexual Activity  . Alcohol use: Yes    Comment: social drinking  . Drug use: Yes    Types: Cocaine  . Sexual activity: Not Currently    Birth control/protection: Post-menopausal  Lifestyle  . Physical activity:    Days per week: Not on file    Minutes per session: Not on file  . Stress: Not on file  Relationships  . Social connections:    Talks on phone: Not on file    Gets together: Not on file    Attends religious service: Not on file    Active member of club or organization: Not on file    Attends meetings of clubs or organizations: Not on file    Relationship status: Not on file  . Intimate partner violence:    Fear of current or ex partner: Not on file    Emotionally abused: Not on file     Physically abused: Not on file    Forced sexual activity: Not on file  Other Topics Concern  . Not on file  Social History Narrative  . Not on file    Past Surgical Hx:  Past Surgical History:  Procedure Laterality Date  . BREAST BIOPSY    . CESAREAN SECTION     one  . CHOLECYSTECTOMY    . DILATION AND CURETTAGE OF UTERUS     approx 1 year ago    Past Medical Hx:  Past Medical History:  Diagnosis Date  . Anemia   . 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 Hx of left breast biopsy   . Hyperlipemia   . Hypertension    not on any medication now-[facility] had  prescribed her a med and she stopped taking them cannot remember when    Past Gynecological History:  C/s x 1, positive high risk HPV in 2019. No LMP recorded (lmp unknown). (Menstrual status: Perimenopausal).  Family Hx:  Family History  Problem Relation Age of Onset  . Anuerysm Mother   . Congestive Heart Failure Mother     Review of Systems:  Constitutional  Feels well,    ENT Normal appearing ears and nares bilaterally Skin/Breast  No rash, sores, jaundice, itching, dryness Cardiovascular  No chest pain, shortness of breath, or edema  Pulmonary  No cough or wheeze.  Gastro Intestinal  No nausea, vomitting, or diarrhoea. No bright red blood per rectum, no abdominal pain, change in bowel movement, or constipation.  Genito Urinary  No frequency, urgency, dysuria, + bleeding Musculo Skeletal  No myalgia, arthralgia, joint swelling or pain  Neurologic  No weakness, numbness, change in gait,  Psychology  No depression, anxiety, insomnia.   Vitals:  Blood pressure (!) 162/81, pulse (!) 59, temperature 98.9 F (37.2 C), temperature source Oral, resp. rate 18, height 5\' 4"  (1.626 m), weight 226 lb 12.8 oz (102.9 kg), SpO2 98 %.  Physical Exam: WD in NAD Neck  Supple NROM, without any enlargements.  Lymph Node Survey No cervical supraclavicular or inguinal adenopathy Cardiovascular  Pulse  normal rate, regularity and rhythm. S1 and S2 normal.  Lungs  Clear to auscultation bilateraly, without wheezes/crackles/rhonchi. Good air movement.  Skin  No rash/lesions/breakdown  Psychiatry  Alert and oriented to person, place, and time  Abdomen  Normoactive bowel sounds, abdomen soft, non-tender and obese without evidence of hernia.  Back No CVA tenderness Genito Urinary  Vulva/vagina: Normal external female genitalia.  No lesions. No discharge or bleeding.  Bladder/urethra:  No lesions or masses, well supported bladder  Vagina: normal, no apparent lesions  Cervix: Normal appearing, no lesions.  Uterus:  Bulky (10-12), mobile, no parametrial involvement or nodularity.  Adnexa: no palpable masses. Rectal  deferred Extremities  No bilateral cyanosis, clubbing or edema.   Thereasa Solo, MD  10/19/2017, 9:52 AM

## 2017-10-20 ENCOUNTER — Ambulatory Visit: Payer: PRIVATE HEALTH INSURANCE | Admitting: Obstetrics & Gynecology

## 2017-10-20 NOTE — Progress Notes (Signed)
I have reviewed this chart and agree with the RN/CMA assessment and management.    Mak Bonny C Vinette Crites, MD, FACOG Attending Physician, [facility]  

## 2017-10-23 ENCOUNTER — Telehealth: Payer: Self-pay | Admitting: *Deleted

## 2017-10-23 NOTE — Telephone Encounter (Signed)
Called and spoke with the patient regarding her post op appt.  

## 2017-10-25 ENCOUNTER — Telehealth: Payer: Self-pay

## 2017-10-25 NOTE — Patient Instructions (Addendum)
Myracle Febres  10/25/2017   Your procedure is scheduled on: Tuesday 10/31/2017  Report to [facility]  Entrance              Report to admitting at  0730 AM    Call this number if you have problems the morning of surgery [phone]   Eat a light diet the day before surgery.  Examples including soups, broths, toast, yogurt, mashed potatoes.  Things to avoid include carbonated beverages (fizzy beverages), raw fruits and raw vegetables, or beans.   If your bowels are filled with gas, your surgeon will have difficulty visualizing your pelvic organs which increases your surgical risks.              Only clear liquids after midnight. Nothing by mouth 3 hours prior to scheduled surgery.              Please finish carbohydrate drink 3 hours prior to surgery!    CLEAR LIQUID DIET   Foods Allowed                                                                     Foods Excluded  Coffee and tea, regular and decaf                             liquids that you cannot  Plain Jell-O in any flavor                                             see through such as: Fruit ices (not with fruit pulp)                                     milk, soups, orange juice  Iced Popsicles                                    All solid food                                 Cranberry, grape and apple juices Sports drinks like Gatorade Lightly seasoned clear broth or consume(fat free) Sugar, honey syrup  Sample Menu Breakfast                                Lunch                                     Supper Cranberry juice                    Beef broth  Chicken broth Jell-O                                     Grape juice                           Apple juice Coffee or tea                        Jell-O                                      Popsicle                                                Coffee or tea                        Coffee or  tea  _____________________________________________________________________    Remember: Do not eat food :After Midnight.       Take these medicines the morning of surgery with A SIP OF WATER: Hydralazine (Apresoline), Metoprolol tartrate (Lopressor)                                You may not have any metal on your body including hair pins and              piercings  Do not wear jewelry, make-up, lotions, powders or perfumes, deodorant             Do not wear nail polish.  Do not shave  48 hours prior to surgery.              Do not bring valuables to the hospital. [place].  Contacts, dentures or bridgework may not be worn into surgery.  Leave suitcase in the car. After surgery it may be brought to your room.                  Please read over the following fact sheets you were given: _____________________________________________________________________             [facility] - Preparing for Surgery Before surgery, you can play an important role.  Because skin is not sterile, your skin needs to be as free of germs as possible.  You can reduce the number of germs on your skin by washing with CHG (chlorahexidine gluconate) soap before surgery.  CHG is an antiseptic cleaner which kills germs and bonds with the skin to continue killing germs even after washing. Please DO NOT use if you have an allergy to CHG or antibacterial soaps.  If your skin becomes reddened/irritated stop using the CHG and inform your nurse when you arrive at Short Stay. Do not shave (including legs and underarms) for at least 48 hours prior to the first CHG shower.  You may shave your face/neck. Please follow these instructions carefully:  1.  Shower with CHG Soap the night before surgery and the  morning of  Surgery.  2.  If you choose to wash your hair, wash your hair first as usual with your  normal  shampoo.  3.  After you shampoo, rinse your hair and body  thoroughly to remove the  shampoo.                           4.  Use CHG as you would any other liquid soap.  You can apply chg directly  to the skin and wash                       Gently with a scrungie or clean washcloth.  5.  Apply the CHG Soap to your body ONLY FROM THE NECK DOWN.   Do not use on face/ open                           Wound or open sores. Avoid contact with eyes, ears mouth and genitals (private parts).                       Wash face,  Genitals (private parts) with your normal soap.             6.  Wash thoroughly, paying special attention to the area where your surgery  will be performed.  7.  Thoroughly rinse your body with warm water from the neck down.  8.  DO NOT shower/wash with your normal soap after using and rinsing off  the CHG Soap.                9.  Pat yourself dry with a clean towel.            10.  Wear clean pajamas.            11.  Place clean sheets on your bed the night of your first shower and do not  sleep with pets. Day of Surgery : Do not apply any lotions/deodorants the morning of surgery.  Please wear clean clothes to the hospital/surgery center.  FAILURE TO FOLLOW THESE INSTRUCTIONS MAY RESULT IN THE CANCELLATION OF YOUR SURGERY PATIENT SIGNATURE_________________________________  NURSE SIGNATURE__________________________________  ________________________________________________________________________   Adam Phenix  An incentive spirometer is a tool that can help keep your lungs clear and active. This tool measures how well you are filling your lungs with each breath. Taking long deep breaths may help reverse or decrease the chance of developing breathing (pulmonary) problems (especially infection) following:  A long period of time when you are unable to move or be active. BEFORE THE PROCEDURE   If the spirometer includes an indicator to show your best effort, your nurse or respiratory therapist will set it to a desired goal.  If  possible, sit up straight or lean slightly forward. Try not to slouch.  Hold the incentive spirometer in an upright position. INSTRUCTIONS FOR USE  1. Sit on the edge of your bed if possible, or sit up as far as you can in bed or on a chair. 2. Hold the incentive spirometer in an upright position. 3. Breathe out normally. 4. Place the mouthpiece in your mouth and seal your lips tightly around it. 5. Breathe in slowly and as deeply as possible, raising the piston or the ball toward the top of the column. 6. Hold your breath for 3-5 seconds or for as long as possible. Allow  the piston or ball to fall to the bottom of the column. 7. Remove the mouthpiece from your mouth and breathe out normally. 8. Rest for a few seconds and repeat Steps 1 through 7 at least 10 times every 1-2 hours when you are awake. Take your time and take a few normal breaths between deep breaths. 9. The spirometer may include an indicator to show your best effort. Use the indicator as a goal to work toward during each repetition. 10. After each set of 10 deep breaths, practice coughing to be sure your lungs are clear. If you have an incision (the cut made at the time of surgery), support your incision when coughing by placing a pillow or rolled up towels firmly against it. Once you are able to get out of bed, walk around indoors and cough well. You may stop using the incentive spirometer when instructed by your caregiver.  RISKS AND COMPLICATIONS  Take your time so you do not get dizzy or light-headed.  If you are in pain, you may need to take or ask for pain medication before doing incentive spirometry. It is harder to take a deep breath if you are having pain. AFTER USE  Rest and breathe slowly and easily.  It can be helpful to keep track of a log of your progress. Your caregiver can provide you with a simple table to help with this. If you are using the spirometer at home, follow these instructions: [place]  IF:   You are having difficultly using the spirometer.  You have trouble using the spirometer as often as instructed.  Your pain medication is not giving enough relief while using the spirometer.  You develop fever of 100.5 F (38.1 C) or higher. SEEK IMMEDIATE MEDICAL CARE IF:   You cough up bloody sputum that had not been present before.  You develop fever of 102 F (38.9 C) or greater.  You develop worsening pain at or near the incision site. MAKE SURE YOU:   Understand these instructions.  Will watch your condition.  Will get help right away if you are not doing well or get worse. Document Released: 10/17/2006 Document Revised: 08/29/2011 Document Reviewed: 12/18/2006 ExitCare Patient Information 2014 ExitCare, [place].   ________________________________________________________________________  WHAT IS A BLOOD TRANSFUSION? Blood Transfusion Information  A transfusion is the replacement of blood or some of its parts. Blood is made up of multiple cells which provide different functions.  Red blood cells carry oxygen and are used for blood loss replacement.  White blood cells fight against infection.  Platelets control bleeding.  Plasma helps clot blood.  Other blood products are available for specialized needs, such as hemophilia or other clotting disorders. BEFORE THE TRANSFUSION  Who gives blood for transfusions?   Healthy volunteers who are fully evaluated to make sure their blood is safe. This is blood bank blood. Transfusion therapy is the safest it has ever been in the practice of medicine. Before blood is taken from a donor, a complete history is taken to make sure that person has no history of diseases nor engages in risky social behavior (examples are intravenous drug use or sexual activity with multiple partners). The donor's travel history is screened to minimize risk of transmitting infections, such as malaria. The donated blood is tested for signs of  infectious diseases, such as HIV and hepatitis. The blood is then tested to be sure it is compatible with you in order to minimize the chance of a transfusion reaction.  If you or a relative donates blood, this is often done in anticipation of surgery and is not appropriate for emergency situations. It takes many days to process the donated blood. RISKS AND COMPLICATIONS Although transfusion therapy is very safe and saves many lives, the main dangers of transfusion include:   Getting an infectious disease.  Developing a transfusion reaction. This is an allergic reaction to something in the blood you were given. Every precaution is taken to prevent this. The decision to have a blood transfusion has been considered carefully by your caregiver before blood is given. Blood is not given unless the benefits outweigh the risks. AFTER THE TRANSFUSION  Right after receiving a blood transfusion, you will usually feel much better and more energetic. This is especially true if your red blood cells have gotten low (anemic). The transfusion raises the level of the red blood cells which carry oxygen, and this usually causes an energy increase.  The nurse administering the transfusion will monitor you carefully for complications. HOME CARE INSTRUCTIONS  No special instructions are needed after a transfusion. You may find your energy is better. Speak with your caregiver about any limitations on activity for underlying diseases you may have. SEEK MEDICAL CARE IF:   Your condition is not improving after your transfusion.  You develop redness or irritation at the intravenous (IV) site. SEEK IMMEDIATE MEDICAL CARE IF:  Any of the following symptoms occur over the next 12 hours:  Shaking chills.  You have a temperature by mouth above 102 F (38.9 C), not controlled by medicine.  Chest, back, or muscle pain.  People around you feel you are not acting correctly or are confused.  Shortness of breath or  difficulty breathing.  Dizziness and fainting.  You get a rash or develop hives.  You have a decrease in urine output.  Your urine turns a dark color or changes to pink, red, or brown. Any of the following symptoms occur over the next 10 days:  You have a temperature by mouth above 102 F (38.9 C), not controlled by medicine.  Shortness of breath.  Weakness after normal activity.  The white part of the eye turns yellow (jaundice).  You have a decrease in the amount of urine or are urinating less often.  Your urine turns a dark color or changes to pink, red, or brown. Document Released: 06/03/2000 Document Revised: 08/29/2011 Document Reviewed: 01/21/2008 [facility] Patient Information 2014 [place], [place].  _______________________________________________________________________

## 2017-10-25 NOTE — Progress Notes (Signed)
Consulted Dr. Andres Shad, MDA face to face about patient's medical history, recent ED visit on 09/12/2017, ECHO  results from 09/13/2017 and CXR and CT angio chest. Per Dr. Valma Cava, MDA, patient needs a medical clearance for her surgery by Dr. Denman George on 10/31/2017.

## 2017-10-25 NOTE — Progress Notes (Signed)
09/14/2017-noted in [place]  09/13/2017- noted in Epic- ECHO  and CT angio chest/abd/pelvis for dissection  09/12/2017- noted in Epic-CXR

## 2017-10-25 NOTE — Telephone Encounter (Addendum)
Patient's sister Georga Kaufmann will be able to bring her to the appointment on Friday 10-27-17 At [facility] to see Dr. Agustin Cree ([address]) at 1:40 pm.  Arrive ~1:20 pm.  Pt does not have a PCP.  She did follow up with Dr. Hulan Fray as directed and referral to Dr. Denman George and now surgery for cancer. No appointments were given for Medical follow up for CHF from hospital and pt concerned about cancer diagnosis and follow up for that.  Apologized to patient and sister that stating that appointments were not made/kept was up setting to them.  Anesthesia is concerned about follow up of CHF and being sure she can medically cleared to have a major surgery with anesthesia.

## 2017-10-26 ENCOUNTER — Encounter (HOSPITAL_COMMUNITY): Payer: Self-pay

## 2017-10-26 ENCOUNTER — Encounter (HOSPITAL_COMMUNITY)
Admission: RE | Admit: 2017-10-26 | Discharge: 2017-10-26 | Disposition: A | Discharge status: Home / Self Care | Payer: PRIVATE HEALTH INSURANCE | Source: Ambulatory Visit | Attending: Gynecologic Oncology | Admitting: Gynecologic Oncology

## 2017-10-26 ENCOUNTER — Other Ambulatory Visit: Payer: Self-pay

## 2017-10-26 DIAGNOSIS — Z01812 Encounter for preprocedural laboratory examination: Secondary | ICD-10-CM | POA: Insufficient documentation

## 2017-10-26 DIAGNOSIS — Z0183 Encounter for blood typing: Secondary | ICD-10-CM | POA: Insufficient documentation

## 2017-10-26 DIAGNOSIS — C541 Malignant neoplasm of endometrium: Secondary | ICD-10-CM | POA: Insufficient documentation

## 2017-10-26 HISTORY — DX: Dyspnea, unspecified: R06.00

## 2017-10-26 HISTORY — DX: Heart failure, unspecified: I50.9

## 2017-10-26 HISTORY — DX: Personal history of other mental and behavioral disorders: Z86.59

## 2017-10-26 LAB — COMPREHENSIVE METABOLIC PANEL
ALT: 14 U/L (ref 14–54)
AST: 13 U/L — AB (ref 15–41)
Albumin: 3.6 g/dL (ref 3.5–5.0)
Alkaline Phosphatase: 69 U/L (ref 38–126)
Anion gap: 12 (ref 5–15)
BILIRUBIN TOTAL: 0.4 mg/dL (ref 0.3–1.2)
BUN: 29 mg/dL — AB (ref 6–20)
CO2: 23 mmol/L (ref 22–32)
CREATININE: 1.76 mg/dL — AB (ref 0.44–1.00)
Calcium: 9 mg/dL (ref 8.9–10.3)
Chloride: 109 mmol/L (ref 101–111)
GFR, EST AFRICAN AMERICAN: 36 mL/min — AB (ref >60)
GFR, EST NON AFRICAN AMERICAN: 31 mL/min — AB (ref >60)
Glucose, Bld: 105 mg/dL — ABNORMAL HIGH (ref 65–99)
Potassium: 3.7 mmol/L (ref 3.5–5.1)
Sodium: 144 mmol/L (ref 135–145)
TOTAL PROTEIN: 7 g/dL (ref 6.5–8.1)

## 2017-10-26 LAB — CBC
HEMATOCRIT: 37.4 % (ref 36.0–46.0)
Hemoglobin: 12.2 g/dL (ref 12.0–15.0)
MCH: 28.9 pg (ref 26.0–34.0)
MCHC: 32.6 g/dL (ref 30.0–36.0)
MCV: 88.6 fL (ref 78.0–100.0)
Platelets: 273 10*3/uL (ref 150–400)
RBC: 4.22 MIL/uL (ref 3.87–5.11)
RDW: 15 % (ref 11.5–15.5)
WBC: 5.9 10*3/uL (ref 4.0–10.5)

## 2017-10-26 LAB — URINALYSIS, ROUTINE W REFLEX MICROSCOPIC
Bacteria, UA: NONE SEEN
Bilirubin Urine: NEGATIVE
GLUCOSE, UA: NEGATIVE mg/dL
Ketones, ur: NEGATIVE mg/dL
Leukocytes, UA: NEGATIVE
Nitrite: NEGATIVE
PH: 7 (ref 5.0–8.0)
Protein, ur: NEGATIVE mg/dL
SPECIFIC GRAVITY, URINE: 1.006 (ref 1.005–1.030)

## 2017-10-26 LAB — ABO/RH: ABO/RH(D): O POS

## 2017-10-26 NOTE — Progress Notes (Signed)
Consulted Dr. Marcie Bal, MDA via phone notifying him that Dr. Denman George wanted patient to see Anesthesia at pre-op appointment for evaluation from her recent CHF. Patient stated to nurse that she had used cocaine about one to two weeks ago maybe. Asked Dr. Marcie Bal if he wanted a urine drug screen morning of surgery and new orders placed in Epic. Patient has an appointment  with Cardiology on 10/27/2017 at the [facility] office. Per Dr. Marcie Bal, Anesthesia will assess patient morning of surgery after she sees Cardiology.

## 2017-10-26 NOTE — Progress Notes (Signed)
Notified Angela John, NP that Anesthesia will assess patient morning of surgery after her Cardiology appointment on 10/27/2017 to see what they have to say and she will get a chest x-ray morning of surgery as per Dr. Valma Cava on 10/25/2017, a chest xray was not needed to assess her CHF.

## 2017-10-26 NOTE — Progress Notes (Signed)
   10/26/17 0830  OBSTRUCTIVE SLEEP APNEA  Have you ever been diagnosed with sleep apnea through a sleep study? No  Do you snore loudly (loud enough to be heard through closed doors)?  1  Do you often feel tired, fatigued, or sleepy during the daytime (such as falling asleep during driving or talking to someone)? 1  Has anyone observed you stop breathing during your sleep? 0  Do you have, or are you being treated for high blood pressure? 1  BMI more than 35 kg/m2? 1  Age > 50 (1-yes) 1  Neck circumference greater than:Female 16 inches or larger, Female 17inches or larger? 1  Female Gender (Yes=1) 0  Obstructive Sleep Apnea Score 6  Score 5 or greater  Results sent to PCP

## 2017-10-27 ENCOUNTER — Telehealth: Payer: Self-pay | Admitting: Gynecologic Oncology

## 2017-10-27 ENCOUNTER — Ambulatory Visit (INDEPENDENT_AMBULATORY_CARE_PROVIDER_SITE_OTHER): Payer: PRIVATE HEALTH INSURANCE | Admitting: Cardiology

## 2017-10-27 ENCOUNTER — Encounter: Payer: Self-pay | Admitting: Cardiology

## 2017-10-27 VITALS — BP 162/106 | HR 58 | Ht 64.0 in | Wt 233.0 lb

## 2017-10-27 DIAGNOSIS — I5030 Unspecified diastolic (congestive) heart failure: Secondary | ICD-10-CM

## 2017-10-27 DIAGNOSIS — C541 Malignant neoplasm of endometrium: Secondary | ICD-10-CM

## 2017-10-27 DIAGNOSIS — R0789 Other chest pain: Secondary | ICD-10-CM | POA: Diagnosis not present

## 2017-10-27 DIAGNOSIS — I5032 Chronic diastolic (congestive) heart failure: Secondary | ICD-10-CM

## 2017-10-27 DIAGNOSIS — I10 Essential (primary) hypertension: Secondary | ICD-10-CM | POA: Diagnosis not present

## 2017-10-27 HISTORY — DX: Unspecified diastolic (congestive) heart failure: I50.30

## 2017-10-27 HISTORY — DX: Essential (primary) hypertension: I10

## 2017-10-27 MED ORDER — HYDRALAZINE HCL 10 MG PO TABS: 10.0000 mg | ORAL_TABLET | Freq: Three times a day (TID) | ORAL | 3 refills | Status: DC

## 2017-10-27 MED ORDER — AMLODIPINE BESYLATE 5 MG PO TABS: 5.0000 mg | ORAL_TABLET | Freq: Every day | ORAL | 3 refills | Status: DC

## 2017-10-27 NOTE — Progress Notes (Signed)
Cardiology Consultation:    Date:  10/27/2017   ID:  Angela Gardner, DOB 09-16-1960, MRN [id number]  PCP:  Patient, No Pcp Per  Cardiologist:  Jenne Campus, MD   Referring MD: No ref. provider found   Chief Complaint  Patient presents with  . New Patient (Initial Visit)    per Dr Denman George to evaluate CHF   I need surgery  History of Present Illness:    Angela Gardner is a 57 y.o. female who is being seen today for the evaluation of cardiac risks before surgery at the request of No ref. provider found.  Recently she was diagnosed with endometrial cancer.  Hysterectomy is contemplated.  However when she was evaluated she was then to find to have multiple risk factors for coronary artery disease including essential hypertension.  Her blood pressure being elevated and she does have evidence of target organ damage.  Echocardiogram was done which showed severe left ventricle hypertrophy.  No obstruction.  Left ventricular ejection fraction was normal but she clearly got pseudo-normal transmitral flow pattern indicating diastolic dysfunction with elevated pulmonary wedge pressure.  Clinically she reports to have shortness of breath.  She said she does not do anything she states all the time however when she goes to [facility] from front to back she has to stop multiple time because of shortness of breath.  She did have some swelling of lower extremities but now is better.  She never was told to have any heart trouble.  She has had high blood pressure for many years however she said for many years she did not take any medications for it. Multiple family members get a problem with high blood pressure and premature cardiac problems.  Past Medical History:  Diagnosis Date  . Anemia   . 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 CHF (congestive heart failure) ([place])   . Dyspnea    still having this and not moving around alot -gets worse with exertion  . History of sleep walking   . Hx of left breast  biopsy   . Hyperlipemia   . Hypertension    not on any medication now-[facility] had  prescribed her a med and she stopped taking them cannot remember when    Past Surgical History:  Procedure Laterality Date  . BREAST BIOPSY     left breast  . CESAREAN SECTION     one  . CHOLECYSTECTOMY    . DILATION AND CURETTAGE OF UTERUS     approx 1 year ago    Current Medications: Current Meds  Medication Sig  . furosemide (LASIX) 40 MG tablet Take 1 tablet (40 mg total) by mouth daily.  . hydrALAZINE (APRESOLINE) 10 MG tablet Take 1 tablet (10 mg total) by mouth 2 (two) times daily.  Marland Kitchen ibuprofen (ADVIL,MOTRIN) 200 MG tablet Take 200-400 mg by mouth every 8 (eight) hours as needed (for pain.).  Marland Kitchen megestrol (MEGACE) 40 MG tablet Take 1 tablet (40 mg total) by mouth 2 (two) times daily.  . metoprolol tartrate (LOPRESSOR) 25 MG tablet Take 1 tablet (25 mg total) by mouth 2 (two) times daily.     Allergies:   Vicodin [hydrocodone-acetaminophen]   Social History   Socioeconomic History  . Marital status: Single    Spouse name: Not on file  . Number of children: Not on file  . Years of education: Not on file  . Highest education level: Not on file  Occupational History  . Not on  file  Social Needs  . Financial resource strain: Not on file  . Food insecurity:    Worry: Not on file    Inability: Not on file  . Transportation needs:    Medical: Not on file    Non-medical: Not on file  Tobacco Use  . Smoking status: Former Smoker    Packs/day: 0.30    Years: 26.00    Pack years: 7.80    Types: Cigarettes  . Smokeless tobacco: Never Used  . Tobacco comment: quit 1 week ago  Substance and Sexual Activity  . Alcohol use: Yes    Comment: social drinking  . Drug use: Yes    Types: Cocaine    Comment: last time was one  to two weeks ago per patient   . Sexual activity: Not Currently    Birth control/protection: Post-menopausal  Lifestyle  . Physical activity:    Days per week:  Not on file    Minutes per session: Not on file  . Stress: Not on file  Relationships  . Social connections:    Talks on phone: Not on file    Gets together: Not on file    Attends religious service: Not on file    Active member of club or organization: Not on file    Attends meetings of clubs or organizations: Not on file    Relationship status: Not on file  Other Topics Concern  . Not on file  Social History Narrative  . Not on file     Family History: The patient's family history includes Anuerysm in her mother; Congestive Heart Failure in her mother; Hypertension in her brother, father, and sister. ROS:   Please see the history of present illness.    All 14 point review of systems negative except as described per history of present illness.  EKGs/Labs/Other Studies Reviewed:    The following studies were reviewed today:   EKG:  EKG is  ordered today.  The ekg ordered today demonstrates sinus bradycardia normal P interval possible left atrial enlargement criteria for left ventricular hypertrophy with repolarization of normalities  Recent Labs: 09/12/2017: B Natriuretic Peptide 258.2 10/26/2017: ALT 14; BUN 29; Creatinine, Ser 1.76; Hemoglobin 12.2; Platelets 273; Potassium 3.7; Sodium 144  Recent Lipid Panel    Component Value Date/Time   CHOL 228 (H) 03/30/2009 2039   TRIG 139 03/30/2009 2039   HDL 67 03/30/2009 2039   CHOLHDL 3.4 Ratio 03/30/2009 2039   VLDL 28 03/30/2009 2039   LDLCALC 133 (H) 03/30/2009 2039    Physical Exam:    VS:  BP (!) 162/106 (BP Location: Right Arm, Patient Position: Sitting, Cuff Size: Large)   Pulse (!) 58   Ht 5\' 4"  (1.626 m)   Wt 233 lb (105.7 kg)   LMP  (LMP Unknown) Comment: vaginal bleeding continuously for last 8 months  SpO2 98%   BMI 39.99 kg/m     Wt Readings from Last 3 Encounters:  10/27/17 233 lb (105.7 kg)  10/26/17 231 lb 6.4 oz (105 kg)  10/19/17 226 lb 12.8 oz (102.9 kg)     GEN:  Well nourished, well developed  in no acute distress HEENT: Normal NECK: No JVD; No carotid bruits LYMPHATICS: No lymphadenopathy CARDIAC: RRR, no murmurs, no rubs, no gallops RESPIRATORY:  Clear to auscultation without rales, wheezing or rhonchi  ABDOMEN: Soft, non-tender, non-distended MUSCULOSKELETAL:  No edema; No deformity  SKIN: Warm and dry NEUROLOGIC:  Alert and oriented x 3 PSYCHIATRIC:  Normal  affect   ASSESSMENT:    1. Endometrial cancer ([place])   2. Essential hypertension   3. Chronic diastolic congestive heart failure, NYHA class 3 (HCC)   4. Chest discomfort    PLAN:    In order of problems listed above:  1. Endometrial cancer with need for surgery.  She is here to be evaluated before the surgery. 2. Essential hypertension obviously big problem.  Still in spite of medication her blood pressure is elevated.  She already does have target organ damage that include kidney dysfunction as well as left ventricular hypertrophy.  Her medication need to be quickly modified so she can go safely to be surgery.  I will ask her to increase dose of hydralazine from 10 mg twice daily to 10 mg 3 times a day.  I will add Norvasc 5 to her medical regiment.  We will continue with furosemide as well as metoprolol. 3. Chronic diastolic congestive heart failure New York Heart Association class III.  Undoubtedly related to high blood pressure.  High blood pressure need to be controlled.  We have to be careful with choices of medications since she does have already some kidney dysfunction. 4. Chest discomfort very atypical they are all the time no aggravating no relieving factors but because of multiple risk factors for coronary artery disease she needs to have a stress test.  However first I would like to correct her blood pressure.  Therefore we will bring her back to my office next Monday   Medication Adjustments/Labs and Tests Ordered: Current medicines are reviewed at length with the patient today.  Concerns regarding  medicines are outlined above.  No orders of the defined types were placed in this encounter.  No orders of the defined types were placed in this encounter.   Signed, Park Liter, MD, [facility]. 10/27/2017 1:53 PM    [facility]

## 2017-10-27 NOTE — Patient Instructions (Addendum)
Medication Instructions:  Your physician has recommended you make the following change in your medication: INCREASE hydralazine 10 mg three times daily START amlodipine (norvasc) 5 mg daily   Labwork: None  Testing/Procedures: You had an EKG today.   Follow-Up: Your physician recommends that you schedule a follow-up appointment on Monday, May 20th, 2019.   Any Other Special Instructions Will Be Listed Below (If Applicable).     If you need a refill on your cardiac medications before your next appointment, please call your pharmacy.

## 2017-10-27 NOTE — Telephone Encounter (Signed)
Spoke with patient.  Advised her of the need to establish with a PCP prior to surgery due to increasing creatinine noted on pre-op labs.  Patient is to meet with cardiology today.  Advised patient that her surgery for Tuesday would need to be postponed until she was cleared with cardiology and with her PCP.  She has been set up to meet with Dr. Charlott Rakes at the [facility] on Monday, May 13 at 1:50pm with arrival at 1:30pm.  Advised to bring her medications with her.  Discussed appointment details with the patient and address and info given.  She states she has court at 8 am on Monday but should be done to go to her appointment.  Advised that our office would contact her with a new surgery date but it could be subject to change based on her medical needs/further testing if any.  Patient advised to call for any questions or concerns.

## 2017-10-30 ENCOUNTER — Encounter: Payer: Self-pay | Admitting: Family Medicine

## 2017-10-30 ENCOUNTER — Ambulatory Visit: Payer: PRIVATE HEALTH INSURANCE | Attending: Family Medicine | Admitting: Family Medicine

## 2017-10-30 VITALS — BP 139/80 | HR 90 | Temp 98.2°F | Ht 64.0 in | Wt 230.8 lb

## 2017-10-30 DIAGNOSIS — C541 Malignant neoplasm of endometrium: Secondary | ICD-10-CM | POA: Diagnosis not present

## 2017-10-30 DIAGNOSIS — N183 Chronic kidney disease, stage 3 unspecified: Secondary | ICD-10-CM

## 2017-10-30 DIAGNOSIS — Z79899 Other long term (current) drug therapy: Secondary | ICD-10-CM | POA: Insufficient documentation

## 2017-10-30 DIAGNOSIS — Z791 Long term (current) use of non-steroidal anti-inflammatories (NSAID): Secondary | ICD-10-CM | POA: Diagnosis not present

## 2017-10-30 DIAGNOSIS — Z9889 Other specified postprocedural states: Secondary | ICD-10-CM | POA: Insufficient documentation

## 2017-10-30 DIAGNOSIS — E785 Hyperlipidemia, unspecified: Secondary | ICD-10-CM | POA: Insufficient documentation

## 2017-10-30 DIAGNOSIS — I130 Hypertensive heart and chronic kidney disease with heart failure and stage 1 through stage 4 chronic kidney disease, or unspecified chronic kidney disease: Secondary | ICD-10-CM | POA: Insufficient documentation

## 2017-10-30 DIAGNOSIS — Z885 Allergy status to narcotic agent status: Secondary | ICD-10-CM | POA: Diagnosis not present

## 2017-10-30 DIAGNOSIS — I10 Essential (primary) hypertension: Secondary | ICD-10-CM | POA: Diagnosis not present

## 2017-10-30 DIAGNOSIS — R0602 Shortness of breath: Secondary | ICD-10-CM | POA: Insufficient documentation

## 2017-10-30 DIAGNOSIS — Z9049 Acquired absence of other specified parts of digestive tract: Secondary | ICD-10-CM | POA: Diagnosis not present

## 2017-10-30 DIAGNOSIS — I5032 Chronic diastolic (congestive) heart failure: Secondary | ICD-10-CM | POA: Insufficient documentation

## 2017-10-30 HISTORY — DX: Chronic kidney disease, stage 3 unspecified: N18.30

## 2017-10-30 NOTE — Patient Instructions (Signed)

## 2017-10-30 NOTE — Progress Notes (Signed)
Subjective:  Patient ID: Angela Gardner, female    DOB: [birth year]  Age: 57 y.o. MRN: [id number]  CC: Establish Care   HPI Angela Gardner is a 57 year old female with a history of hypertension, stage III chronic kidney disease, diastolic CHF (EF 50% from 08/2017), newly diagnosed endometrial carcinoma who presents today to establish care with me. She was recently diagnosed with endometrial carcinoma in 09/2017 and is scheduled for robotic assisted total abdominal hysterectomy and bilateral salpingo-oophorectomy on 11/14/2017 but was found to have severely elevated blood pressure and referred to cardiology for risk stratification. She had a visit with cardiology, Dr. Agustin Cree on 10/27/2017 at which time her medications were optimized. She presents today in a blood pressure is controlled, she endorses compliance with her medications.  Denies chest pains but does have shortness of breath on mild to moderate exertion.  Denies pedal edema.  Echo 09/13/2017: Study Conclusions  - Left ventricle: The cavity size was normal. Wall thickness was   increased in a pattern of severe LVH. The estimated ejection   fraction was 55%. Wall motion was normal; there were no regional   wall motion abnormalities. Features are consistent with a   pseudonormal left ventricular filling pattern, with concomitant   abnormal relaxation and increased filling pressure (grade 2   diastolic dysfunction). - Aortic valve: There was no stenosis. - Aorta: Ascending aortic diameter: 36 mm (S). - Ascending aorta: The ascending aorta was mildly dilated. - Mitral valve: There was trivial regurgitation. - Left atrium: The atrium was moderately dilated. - Right ventricle: The cavity size was normal. Systolic function   was normal. - Right atrium: The atrium was mildly dilated. - Tricuspid valve: Peak RV-RA gradient (S): 27 mm Hg. - Pulmonary arteries: PA peak pressure: 30 mm Hg (S). - Inferior vena cava: The vessel was normal  in size. The   respirophasic diameter changes were in the normal range (= 50%),   consistent with normal central venous pressure.  Impressions:  - Normal LV size with severe LV hypertrophy. EF 55%. Moderate   diastolic dysfunction. Normal RV size and systolic function. No   significant valvular abnormalities.  Past Medical History:  Diagnosis Date  . Anemia   . 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 CHF (congestive heart failure) ([place])   . Dyspnea    still having this and not moving around alot -gets worse with exertion  . History of sleep walking   . Hx of left breast biopsy   . Hyperlipemia   . Hypertension    not on any medication now-[facility] had  prescribed her a med and she stopped taking them cannot remember when    Past Surgical History:  Procedure Laterality Date  . BREAST BIOPSY     left breast  . CESAREAN SECTION     one  . CHOLECYSTECTOMY    . DILATION AND CURETTAGE OF UTERUS     approx 1 year ago    Allergies  Allergen Reactions  . Vicodin [Hydrocodone-Acetaminophen] Itching     Outpatient Medications Prior to Visit  Medication Sig Dispense Refill  . amLODipine (NORVASC) 5 MG tablet Take 1 tablet (5 mg total) by mouth daily. 90 tablet 3  . furosemide (LASIX) 40 MG tablet Take 1 tablet (40 mg total) by mouth daily. 30 tablet 5  . hydrALAZINE (APRESOLINE) 10 MG tablet Take 1 tablet (10 mg total) by mouth 3 (three) times daily. 270 tablet 3  . ibuprofen (ADVIL,MOTRIN) 200 MG  tablet Take 200-400 mg by mouth every 8 (eight) hours as needed (for pain.).    Marland Kitchen megestrol (MEGACE) 40 MG tablet Take 1 tablet (40 mg total) by mouth 2 (two) times daily. 60 tablet 3  . metoprolol tartrate (LOPRESSOR) 25 MG tablet Take 1 tablet (25 mg total) by mouth 2 (two) times daily. 60 tablet 5   No facility-administered medications prior to visit.     ROS Review of Systems  Constitutional: Negative for activity change, appetite change and fatigue.  HENT: Negative  for congestion, sinus pressure and sore throat.   Eyes: Negative for visual disturbance.  Respiratory: Positive for shortness of breath (on moderate exertion). Negative for cough, chest tightness and wheezing.   Cardiovascular: Negative for chest pain and palpitations.  Gastrointestinal: Negative for abdominal distention, abdominal pain and constipation.  Endocrine: Negative for polydipsia.  Genitourinary: Negative for dysuria and frequency.  Musculoskeletal: Negative for arthralgias and back pain.  Skin: Negative for rash.  Neurological: Negative for tremors, light-headedness and numbness.  Hematological: Does not bruise/bleed easily.  Psychiatric/Behavioral: Negative for agitation and behavioral problems.    Objective:  BP 139/80   Pulse 90   Temp 98.2 F (36.8 C) (Oral)   Ht 5\' 4"  (1.626 m)   Wt 230 lb 12.8 oz (104.7 kg)   LMP  (LMP Unknown) Comment: vaginal bleeding continuously for last 8 months  SpO2 93%   BMI 39.62 kg/m   BP/Weight 10/30/2017 1/51/7616 0/12/3708  Systolic BP 626 948 546  Diastolic BP 80 270 95  Wt. (Lbs) 230.8 233 231.4  BMI 39.62 39.99 39.72      Physical Exam  Constitutional: She is oriented to person, place, and time. She appears well-developed and well-nourished.  Cardiovascular: Normal rate and intact distal pulses.  Murmur (2/6 sytolic) heard. Pulmonary/Chest: Effort normal and breath sounds normal. She has no wheezes. She has no rales. She exhibits no tenderness.  Abdominal: Soft. Bowel sounds are normal. She exhibits no distension and no mass. There is no tenderness.  Musculoskeletal: Normal range of motion.  Neurological: She is alert and oriented to person, place, and time.  Skin: Skin is warm and dry.  Psychiatric: She has a normal mood and affect.     CMP Latest Ref Rng & Units 10/26/2017 09/16/2017 09/15/2017  Glucose 65 - 99 mg/dL 105(H) 99 93  BUN 6 - 20 mg/dL 29(H) 35(H) 30(H)  Creatinine 0.44 - 1.00 mg/dL 1.76(H) 1.52(H) 1.58(H)    Sodium 135 - 145 mmol/L 144 140 135  Potassium 3.5 - 5.1 mmol/L 3.7 4.0 3.5  Chloride 101 - 111 mmol/L 109 109 103  CO2 22 - 32 mmol/L 23 21(L) 23  Calcium 8.9 - 10.3 mg/dL 9.0 9.1 8.6(L)  Total Protein 6.5 - 8.1 g/dL 7.0 - -  Total Bilirubin 0.3 - 1.2 mg/dL 0.4 - -  Alkaline Phos 38 - 126 U/L 69 - -  AST 15 - 41 U/L 13(L) - -  ALT 14 - 54 U/L 14 - -    CBC    Component Value Date/Time   WBC 5.9 10/26/2017 0903   RBC 4.22 10/26/2017 0903   HGB 12.2 10/26/2017 0903   HCT 37.4 10/26/2017 0903   PLT 273 10/26/2017 0903   MCV 88.6 10/26/2017 0903   MCH 28.9 10/26/2017 0903   MCHC 32.6 10/26/2017 0903   RDW 15.0 10/26/2017 0903   LYMPHSABS 1.1 05/21/2015 1135   MONOABS 1.0 05/21/2015 1135   EOSABS 0.0 05/21/2015 1135   BASOSABS  0.0 05/21/2015 1135     Assessment & Plan:   1. Chronic diastolic congestive heart failure, NYHA class 3 (HCC) EF 18%, grade 2 diastolic dysfunction, LVH from 08/2017 NYHA II-III Euvolemic at this time Continue Lasix Keep appointment with cardiology  2. Endometrial cancer [facility]) Has upcoming hysterectomy scheduled  3. Essential hypertension Controlled Continue antihypertensive Low sodium, DASH diet  4. 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   Follow-up: Return in about 3 months (around 01/30/2018) for Follow-up of chronic medical conditions.   Charlott Rakes MD

## 2017-10-31 LAB — TYPE AND SCREEN
ABO/RH(D): O POS
ANTIBODY SCREEN: NEGATIVE

## 2017-11-06 ENCOUNTER — Encounter: Payer: Self-pay | Admitting: Cardiology

## 2017-11-06 ENCOUNTER — Telehealth (HOSPITAL_COMMUNITY): Payer: Self-pay | Admitting: *Deleted

## 2017-11-06 ENCOUNTER — Ambulatory Visit (HOSPITAL_BASED_OUTPATIENT_CLINIC_OR_DEPARTMENT_OTHER)
Admission: RE | Admit: 2017-11-06 | Discharge: 2017-11-06 | Disposition: A | Discharge status: Home / Self Care | Payer: PRIVATE HEALTH INSURANCE | Source: Ambulatory Visit | Attending: Cardiology | Admitting: Cardiology

## 2017-11-06 ENCOUNTER — Ambulatory Visit (INDEPENDENT_AMBULATORY_CARE_PROVIDER_SITE_OTHER): Payer: PRIVATE HEALTH INSURANCE | Admitting: Cardiology

## 2017-11-06 ENCOUNTER — Encounter: Payer: Self-pay | Admitting: *Deleted

## 2017-11-06 VITALS — BP 140/82 | HR 64 | Ht 68.0 in | Wt 225.1 lb

## 2017-11-06 DIAGNOSIS — I10 Essential (primary) hypertension: Secondary | ICD-10-CM | POA: Diagnosis not present

## 2017-11-06 DIAGNOSIS — J209 Acute bronchitis, unspecified: Secondary | ICD-10-CM

## 2017-11-06 DIAGNOSIS — I517 Cardiomegaly: Secondary | ICD-10-CM | POA: Insufficient documentation

## 2017-11-06 DIAGNOSIS — I5032 Chronic diastolic (congestive) heart failure: Secondary | ICD-10-CM

## 2017-11-06 DIAGNOSIS — R058 Other specified cough: Secondary | ICD-10-CM

## 2017-11-06 DIAGNOSIS — R05 Cough: Secondary | ICD-10-CM

## 2017-11-06 DIAGNOSIS — C541 Malignant neoplasm of endometrium: Secondary | ICD-10-CM

## 2017-11-06 DIAGNOSIS — Z01818 Encounter for other preprocedural examination: Secondary | ICD-10-CM

## 2017-11-06 MED ORDER — AZITHROMYCIN 250 MG PO TABS: ORAL_TABLET | ORAL | 0 refills | Status: DC

## 2017-11-06 MED ORDER — AMLODIPINE BESYLATE 10 MG PO TABS: 10.0000 mg | ORAL_TABLET | Freq: Every day | ORAL | 3 refills | Status: DC

## 2017-11-06 NOTE — Patient Instructions (Signed)
Medication Instructions:  Your physician has recommended you make the following change in your medication:  INCREASE amlodipine (norvasc) 10 mg daily START azithromycin (zithromax) 250 mg as directed   Labwork: None  Testing/Procedures: A chest x-ray takes a picture of the organs and structures inside the chest, including the heart, lungs, and blood vessels. This test can show several things, including, whether the heart is enlarges; whether fluid is building up in the lungs; and whether pacemaker / defibrillator leads are still in place.  Your physician has requested that you have a lexiscan myoview. For further information please visit [email or web address]. Please follow instruction sheet, as given.  Follow-Up: Your physician recommends that you schedule a follow-up appointment in: 1 month.   If you need a refill on your cardiac medications before your next appointment, please call your pharmacy.   Thank you for choosing [facility]! Robyne Peers, RN [phone]

## 2017-11-06 NOTE — Telephone Encounter (Signed)
Left message on voicemail per DPR in reference to upcoming appointment scheduled on 11/09/17 at [place] with detailed instructions given per Myocardial Perfusion Study Information Sheet for the test. LM to arrive 15 minutes early, and that it is imperative to arrive on time for appointment to keep from having the test rescheduled. If you need to cancel or reschedule your appointment, please call the office within 24 hours of your appointment. Failure to do so may result in a cancellation of your appointment, and a $50 no show fee. Phone number given for call back for any questions.

## 2017-11-06 NOTE — Progress Notes (Signed)
Cardiology Office Note:    Date:  11/06/2017   ID:  Angela Gardner, DOB Nov 26, 1960, MRN [id number]  PCP:  Charlott Rakes, MD  Cardiologist:  Jenne Campus, MD    Referring MD: No ref. provider found   Chief Complaint  Patient presents with  . Follow-up  I had a cold like symptoms  History of Present Illness:    Angela Gardner is a 57 y.o. female with hypertension diastolic congestive heart failure.  Gradually was increasing dosages of medication to get blood pressure under control and we making some progress today blood pressure appears to be quite reasonable.  Still however not at the target new problem is that at the end of last week she started having cold-like symptoms.  She got cough with greenish/brownish-like sputum.  Shortness of breath.  However all those things are improving.  Denies have any chest pain tightness squeezing pressure burning chest.  Denies having fever or chills  Past Medical History:  Diagnosis Date  . Anemia   . 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 CHF (congestive heart failure) ([place])   . Dyspnea    still having this and not moving around alot -gets worse with exertion  . History of sleep walking   . Hx of left breast biopsy   . Hyperlipemia   . Hypertension    not on any medication now-[facility] had  prescribed her a med and she stopped taking them cannot remember when    Past Surgical History:  Procedure Laterality Date  . BREAST BIOPSY     left breast  . CESAREAN SECTION     one  . CHOLECYSTECTOMY    . DILATION AND CURETTAGE OF UTERUS     approx 1 year ago    Current Medications: Current Meds  Medication Sig  . amLODipine (NORVASC) 5 MG tablet Take 1 tablet (5 mg total) by mouth daily.  . furosemide (LASIX) 40 MG tablet Take 1 tablet (40 mg total) by mouth daily.  . hydrALAZINE (APRESOLINE) 10 MG tablet Take 1 tablet (10 mg total) by mouth 3 (three) times daily.  Marland Kitchen ibuprofen (ADVIL,MOTRIN) 200 MG tablet Take 200-400 mg by  mouth every 8 (eight) hours as needed (for pain.).  Marland Kitchen megestrol (MEGACE) 40 MG tablet Take 1 tablet (40 mg total) by mouth 2 (two) times daily.  . metoprolol tartrate (LOPRESSOR) 25 MG tablet Take 1 tablet (25 mg total) by mouth 2 (two) times daily.     Allergies:   Vicodin [hydrocodone-acetaminophen]   Social History   Socioeconomic History  . Marital status: Single    Spouse name: Not on file  . Number of children: Not on file  . Years of education: Not on file  . Highest education level: Not on file  Occupational History  . Not on file  Social Needs  . Financial resource strain: Not on file  . Food insecurity:    Worry: Not on file    Inability: Not on file  . Transportation needs:    Medical: Not on file    Non-medical: Not on file  Tobacco Use  . Smoking status: Former Smoker    Packs/day: 0.30    Years: 26.00    Pack years: 7.80    Types: Cigarettes  . Smokeless tobacco: Never Used  . Tobacco comment: quit 1 week ago  Substance and Sexual Activity  . Alcohol use: Yes    Comment: social drinking  . Drug use: Yes  Types: Cocaine    Comment: last time was one  to two weeks ago per patient   . Sexual activity: Not Currently    Birth control/protection: Post-menopausal  Lifestyle  . Physical activity:    Days per week: Not on file    Minutes per session: Not on file  . Stress: Not on file  Relationships  . Social connections:    Talks on phone: Not on file    Gets together: Not on file    Attends religious service: Not on file    Active member of club or organization: Not on file    Attends meetings of clubs or organizations: Not on file    Relationship status: Not on file  Other Topics Concern  . Not on file  Social History Narrative  . Not on file     Family History: The patient's family history includes Anuerysm in her mother; Congestive Heart Failure in her mother; Hypertension in her brother, father, and sister. ROS:   Please see the history of  present illness.    All 14 point review of systems negative except as described per history of present illness  EKGs/Labs/Other Studies Reviewed:      Recent Labs: 09/12/2017: B Natriuretic Peptide 258.2 10/26/2017: ALT 14; BUN 29; Creatinine, Ser 1.76; Hemoglobin 12.2; Platelets 273; Potassium 3.7; Sodium 144  Recent Lipid Panel    Component Value Date/Time   CHOL 228 (H) 03/30/2009 2039   TRIG 139 03/30/2009 2039   HDL 67 03/30/2009 2039   CHOLHDL 3.4 Ratio 03/30/2009 2039   VLDL 28 03/30/2009 2039   LDLCALC 133 (H) 03/30/2009 2039    Physical Exam:    VS:  BP 140/82   Pulse 64   Ht 5\' 8"  (1.727 m)   Wt 225 lb 1.9 oz (102.1 kg)   LMP  (LMP Unknown) Comment: vaginal bleeding continuously for last 8 months  SpO2 96%   BMI 34.23 kg/m     Wt Readings from Last 3 Encounters:  11/06/17 225 lb 1.9 oz (102.1 kg)  10/30/17 230 lb 12.8 oz (104.7 kg)  10/27/17 233 lb (105.7 kg)     GEN:  Well nourished, well developed in no acute distress HEENT: Normal NECK: No JVD; No carotid bruits LYMPHATICS: No lymphadenopathy CARDIAC: RRR, no murmurs, no rubs, no gallops RESPIRATORY: Bilateral poor air entry with crackles and rails in the left base ABDOMEN: Soft, non-tender, non-distended MUSCULOSKELETAL:  No edema; No deformity  SKIN: Warm and dry LOWER EXTREMITIES: no swelling NEUROLOGIC:  Alert and oriented x 3 PSYCHIATRIC:  Normal affect   ASSESSMENT:    1. Chronic diastolic congestive heart failure, NYHA class 3 ([place])   2. Essential hypertension   3. Acute bronchitis, unspecified organism   4. Endometrial cancer ([place])    PLAN:    In order of problems listed above:  1. Chronic diastolic congestive heart failure.  Doing better from that point review still some room for improvement in terms of blood pressure management I will double the dose of Norvasc to 10 mg daily. 2. Acute bronchitis and I am worried about her having pneumonia.  I will ask her to have chest x-ray today I  put her on Zithromax.  We will also call primary care physician office and get appointment for her in about 3 days. 3. Endometrial cancer.  She is scheduled to have surgery on Monday however I would like to have stress that before the surgery therefore I think the best option will  be to proceed with stress testing on Thursday or Friday before surgery. 4.    Medication Adjustments/Labs and Tests Ordered: Current medicines are reviewed at length with the patient today.  Concerns regarding medicines are outlined above.  No orders of the defined types were placed in this encounter.  Medication changes: No orders of the defined types were placed in this encounter.   Signed, Park Liter, MD, [facility] 11/06/2017 11:46 AM    [place]

## 2017-11-07 ENCOUNTER — Telehealth: Payer: Self-pay | Admitting: *Deleted

## 2017-11-07 NOTE — Telephone Encounter (Signed)
Left message to return call regarding chest xray results.  

## 2017-11-08 ENCOUNTER — Encounter: Payer: Self-pay | Admitting: Family Medicine

## 2017-11-08 ENCOUNTER — Telehealth: Payer: Self-pay | Admitting: Cardiology

## 2017-11-08 ENCOUNTER — Ambulatory Visit: Payer: PRIVATE HEALTH INSURANCE | Attending: Family Medicine | Admitting: Family Medicine

## 2017-11-08 VITALS — BP 133/84 | HR 51 | Temp 98.6°F | Resp 16 | Wt 226.8 lb

## 2017-11-08 DIAGNOSIS — I10 Essential (primary) hypertension: Secondary | ICD-10-CM

## 2017-11-08 DIAGNOSIS — Z9049 Acquired absence of other specified parts of digestive tract: Secondary | ICD-10-CM | POA: Diagnosis not present

## 2017-11-08 DIAGNOSIS — I5032 Chronic diastolic (congestive) heart failure: Secondary | ICD-10-CM

## 2017-11-08 DIAGNOSIS — N183 Chronic kidney disease, stage 3 (moderate): Secondary | ICD-10-CM | POA: Diagnosis not present

## 2017-11-08 DIAGNOSIS — J40 Bronchitis, not specified as acute or chronic: Secondary | ICD-10-CM

## 2017-11-08 DIAGNOSIS — C541 Malignant neoplasm of endometrium: Secondary | ICD-10-CM

## 2017-11-08 DIAGNOSIS — Z79899 Other long term (current) drug therapy: Secondary | ICD-10-CM | POA: Diagnosis not present

## 2017-11-08 DIAGNOSIS — I130 Hypertensive heart and chronic kidney disease with heart failure and stage 1 through stage 4 chronic kidney disease, or unspecified chronic kidney disease: Secondary | ICD-10-CM | POA: Insufficient documentation

## 2017-11-08 DIAGNOSIS — Z9889 Other specified postprocedural states: Secondary | ICD-10-CM | POA: Insufficient documentation

## 2017-11-08 NOTE — Telephone Encounter (Signed)
New Message   Patient is returning call in reference to xray results. Please call

## 2017-11-08 NOTE — Progress Notes (Signed)
Subjective:  Patient ID: Angela Gardner, female    DOB: 23-May-1961  Age: 57 y.o. MRN: [id number]  CC: Medical Clearance (Surgery)   HPI Angela Gardner is a 57 year old female with a history of hypertension, stage III chronic kidney disease, diastolic CHF (EF 14% from 08/2017), newly diagnosed endometrial carcinoma in 09/2017 and is scheduled for robotic assisted total abdominal hysterectomy and bilateral salpingo-oophorectomy on 11/14/2017. She was seen by cardiology 2 days ago and commenced on Z-Pak for bronchitis; chest x-ray revealed cardiomegaly.  She was scheduled for follow-up here in the clinic. Symptoms have improved significantly and she endorses residual nasal congestion and has 3 more days of her azithromycin to go. Denies fever, cough, myalgias. She is scheduled for stress test tomorrow.  Past Medical History:  Diagnosis Date  . Anemia   . 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 CHF (congestive heart failure) ([place])   . Dyspnea    still having this and not moving around alot -gets worse with exertion  . History of sleep walking   . Hx of left breast biopsy   . Hyperlipemia   . Hypertension    not on any medication now-[facility] had  prescribed her a med and she stopped taking them cannot remember when    Past Surgical History:  Procedure Laterality Date  . BREAST BIOPSY     left breast  . CESAREAN SECTION     one  . CHOLECYSTECTOMY    . DILATION AND CURETTAGE OF UTERUS     approx 1 year ago    Allergies  Allergen Reactions  . Vicodin [Hydrocodone-Acetaminophen] Itching     Outpatient Medications Prior to Visit  Medication Sig Dispense Refill  . amLODipine (NORVASC) 10 MG tablet Take 1 tablet (10 mg total) by mouth daily. 90 tablet 3  . azithromycin (ZITHROMAX) 250 MG tablet Take 1 tablet twice daily for the first day, then take 1 tablet daily. 6 each 0  . furosemide (LASIX) 40 MG tablet Take 1 tablet (40 mg total) by mouth daily. 30 tablet 5  .  hydrALAZINE (APRESOLINE) 10 MG tablet Take 1 tablet (10 mg total) by mouth 3 (three) times daily. 270 tablet 3  . ibuprofen (ADVIL,MOTRIN) 200 MG tablet Take 200-400 mg by mouth every 8 (eight) hours as needed (for pain.).    Marland Kitchen megestrol (MEGACE) 40 MG tablet Take 1 tablet (40 mg total) by mouth 2 (two) times daily. 60 tablet 3  . metoprolol tartrate (LOPRESSOR) 25 MG tablet Take 1 tablet (25 mg total) by mouth 2 (two) times daily. 60 tablet 5   No facility-administered medications prior to visit.     ROS Review of Systems  Constitutional: Negative for activity change, appetite change and fatigue.  HENT: Positive for congestion. Negative for sinus pressure and sore throat.   Eyes: Negative for visual disturbance.  Respiratory: Negative for cough, chest tightness, shortness of breath and wheezing.   Cardiovascular: Negative for chest pain and palpitations.  Gastrointestinal: Negative for abdominal distention, abdominal pain and constipation.  Endocrine: Negative for polydipsia.  Genitourinary: Negative for dysuria and frequency.  Musculoskeletal: Negative for arthralgias and back pain.  Skin: Negative for rash.  Neurological: Negative for tremors, light-headedness and numbness.  Hematological: Does not bruise/bleed easily.  Psychiatric/Behavioral: Negative for agitation and behavioral problems.    Objective:  BP 133/84   Pulse (!) 51   Temp 98.6 F (37 C) (Oral)   Resp 16   Wt 226 lb 12.8 oz (  102.9 kg)   LMP  (LMP Unknown) Comment: vaginal bleeding continuously for last 8 months  SpO2 98%   BMI 34.48 kg/m   BP/Weight 11/08/2017 11/06/2017 7/90/3833  Systolic BP 383 291 916  Diastolic BP 84 82 80  Wt. (Lbs) 226.8 225.12 230.8  BMI 34.48 34.23 39.62      Physical Exam  Constitutional: She is oriented to person, place, and time. She appears well-developed and well-nourished.  HENT:  Postnasal drip  Cardiovascular: Normal heart sounds and intact distal pulses. Bradycardia  present.  No murmur heard. Pulmonary/Chest: Effort normal and breath sounds normal. She has no wheezes. She has no rales. She exhibits no tenderness.  Abdominal: Soft. Bowel sounds are normal. She exhibits no distension and no mass. There is no tenderness.  Musculoskeletal: Normal range of motion.  Neurological: She is alert and oriented to person, place, and time.  Skin: Skin is warm and dry.  Psychiatric: She has a normal mood and affect.     Assessment & Plan:   1. Essential hypertension Controlled Continue antihypertensive Low-sodium diet  2. Chronic diastolic congestive heart failure, NYHA class 3 (HCC) EF 55% from 08/2017 Euvolemic Continue current medications on appointment with cardiology Has upcoming appointment for stress test for risk stratification prior to surgery  3. Endometrial cancer ([place]) Scheduled for TAHBSO on 11/06/2017  4. Bronchitis Currently on azithromycin Improving Advised to obtain Zyrtec which will help with postnasal drip.   No orders of the defined types were placed in this encounter.   Follow-up: Return for follow up of chronic medical conditions, keep previously scheduled appointment.Charlott Rakes MD

## 2017-11-08 NOTE — Telephone Encounter (Signed)
Patient informed of chest xray results.  

## 2017-11-08 NOTE — Telephone Encounter (Signed)
Left message to return call 

## 2017-11-08 NOTE — Progress Notes (Deleted)
Patient ID: Angela Gardner, female   DOB: 06-12-1961, 57 y.o.   MRN: [id number] Seen here on 10/30/2017 to establish care and seen by cardiology 11/06/2017.  She is being treated for bronchitois with a Zpack by cardiology and CXR was negative for pneumonia.  She is scheduled to have a stress test this week PTA to the hospital 11/14/2017 for surgery for endometrial CA.  Please see previous notes for additional details.

## 2017-11-09 ENCOUNTER — Ambulatory Visit (HOSPITAL_COMMUNITY): Payer: PRIVATE HEALTH INSURANCE | Attending: Internal Medicine

## 2017-11-09 VITALS — Ht 64.0 in | Wt 225.0 lb

## 2017-11-09 DIAGNOSIS — R0789 Other chest pain: Secondary | ICD-10-CM

## 2017-11-09 DIAGNOSIS — I10 Essential (primary) hypertension: Secondary | ICD-10-CM

## 2017-11-09 DIAGNOSIS — Z01818 Encounter for other preprocedural examination: Secondary | ICD-10-CM | POA: Diagnosis present

## 2017-11-09 LAB — MYOCARDIAL PERFUSION IMAGING
CHL CUP NUCLEAR SRS: 3
CHL CUP RESTING HR STRESS: 55 {beats}/min
CSEPPHR: 83 {beats}/min
LHR: 0.34
LV dias vol: 134 mL (ref 46–106)
LVSYSVOL: 67 mL
SDS: 3
SSS: 6
TID: 0.99

## 2017-11-09 MED ORDER — TECHNETIUM TC 99M TETROFOSMIN IV KIT
32.7000 | PACK | Freq: Once | INTRAVENOUS | Status: AC | PRN
  Administered 2017-11-09: 32.7 via INTRAVENOUS
  Filled 2017-11-09: qty 33

## 2017-11-09 MED ORDER — REGADENOSON 0.4 MG/5ML IV SOLN
0.4000 mg | Freq: Once | INTRAVENOUS | Status: AC
  Administered 2017-11-09: 0.4 mg via INTRAVENOUS

## 2017-11-09 MED ORDER — TECHNETIUM TC 99M TETROFOSMIN IV KIT
10.9000 | PACK | Freq: Once | INTRAVENOUS | Status: AC | PRN
  Administered 2017-11-09: 10.9 via INTRAVENOUS
  Filled 2017-11-09: qty 11

## 2017-11-10 ENCOUNTER — Telehealth: Payer: Self-pay | Admitting: Cardiology

## 2017-11-10 ENCOUNTER — Ambulatory Visit: Payer: PRIVATE HEALTH INSURANCE

## 2017-11-10 ENCOUNTER — Encounter: Payer: Self-pay | Admitting: *Deleted

## 2017-11-10 ENCOUNTER — Telehealth: Payer: Self-pay | Admitting: Oncology

## 2017-11-10 NOTE — Telephone Encounter (Signed)
Santiago Glad from Dr. Denman George office needs surgery clearance for patient faxed to [phone]

## 2017-11-10 NOTE — Telephone Encounter (Signed)
Cardiac clearance letter faxed to Dr. Serita Grit office.

## 2017-11-10 NOTE — Telephone Encounter (Signed)
Called Dr. Wendy Poet office to request cardiac clearance for surgery on 11/14/17.  Spoke to the receptionist who will forward the request to Dr. Agustin Cree.

## 2017-11-14 ENCOUNTER — Encounter (HOSPITAL_COMMUNITY): Payer: Self-pay | Admitting: Certified Registered"

## 2017-11-14 ENCOUNTER — Ambulatory Visit (HOSPITAL_COMMUNITY)
Admission: RE | Admit: 2017-11-14 | Discharge: 2017-11-17 | Disposition: A | Discharge status: Home / Self Care | Payer: PRIVATE HEALTH INSURANCE | Source: Ambulatory Visit | Attending: Gynecologic Oncology | Admitting: Gynecologic Oncology

## 2017-11-14 ENCOUNTER — Encounter (HOSPITAL_COMMUNITY)
Admission: RE | Disposition: A | Discharge status: Home / Self Care | Payer: Self-pay | Source: Ambulatory Visit | Attending: Gynecologic Oncology

## 2017-11-14 ENCOUNTER — Ambulatory Visit (HOSPITAL_COMMUNITY): Payer: PRIVATE HEALTH INSURANCE | Admitting: Certified Registered"

## 2017-11-14 ENCOUNTER — Other Ambulatory Visit: Payer: Self-pay

## 2017-11-14 ENCOUNTER — Telehealth: Payer: Self-pay | Admitting: Oncology

## 2017-11-14 DIAGNOSIS — D259 Leiomyoma of uterus, unspecified: Secondary | ICD-10-CM | POA: Insufficient documentation

## 2017-11-14 DIAGNOSIS — C541 Malignant neoplasm of endometrium: Secondary | ICD-10-CM

## 2017-11-14 DIAGNOSIS — W19XXXA Unspecified fall, initial encounter: Secondary | ICD-10-CM

## 2017-11-14 DIAGNOSIS — I509 Heart failure, unspecified: Secondary | ICD-10-CM

## 2017-11-14 DIAGNOSIS — N183 Chronic kidney disease, stage 3 unspecified: Secondary | ICD-10-CM

## 2017-11-14 DIAGNOSIS — R0989 Other specified symptoms and signs involving the circulatory and respiratory systems: Secondary | ICD-10-CM

## 2017-11-14 DIAGNOSIS — N888 Other specified noninflammatory disorders of cervix uteri: Secondary | ICD-10-CM | POA: Diagnosis not present

## 2017-11-14 DIAGNOSIS — N800 Endometriosis of uterus: Secondary | ICD-10-CM | POA: Diagnosis not present

## 2017-11-14 HISTORY — PX: LYMPH NODE BIOPSY: SHX201

## 2017-11-14 HISTORY — PX: ROBOTIC ASSISTED TOTAL HYSTERECTOMY WITH BILATERAL SALPINGO OOPHERECTOMY: SHX6086

## 2017-11-14 LAB — TYPE AND SCREEN
ABO/RH(D): O POS
ANTIBODY SCREEN: NEGATIVE

## 2017-11-14 LAB — RAPID URINE DRUG SCREEN, HOSP PERFORMED
Amphetamines: NOT DETECTED
BENZODIAZEPINES: NOT DETECTED
Barbiturates: NOT DETECTED
COCAINE: POSITIVE — AB
Opiates: NOT DETECTED
Tetrahydrocannabinol: NOT DETECTED

## 2017-11-14 SURGERY — HYSTERECTOMY, TOTAL, ROBOT-ASSISTED, LAPAROSCOPIC, WITH BILATERAL SALPINGO-OOPHORECTOMY
Anesthesia: General

## 2017-11-14 MED ORDER — LIDOCAINE 2% (20 MG/ML) 5 ML SYRINGE
INTRAMUSCULAR | Status: DC | PRN
  Administered 2017-11-14: 60 mg via INTRAVENOUS

## 2017-11-14 MED ORDER — LACTATED RINGERS IV SOLN
INTRAVENOUS | Status: DC | PRN
  Administered 2017-11-14: 13:00:00 via INTRAVENOUS

## 2017-11-14 MED ORDER — ENOXAPARIN SODIUM 40 MG/0.4ML ~~LOC~~ SOLN
40.0000 mg | SUBCUTANEOUS | Status: AC
  Administered 2017-11-14: 40 mg via SUBCUTANEOUS
  Filled 2017-11-14: qty 0.4

## 2017-11-14 MED ORDER — HYDROMORPHONE HCL 1 MG/ML IJ SOLN
INTRAMUSCULAR | Status: AC
  Filled 2017-11-14: qty 1

## 2017-11-14 MED ORDER — LACTATED RINGERS IR SOLN
Status: DC | PRN
  Administered 2017-11-14: 1000 mL

## 2017-11-14 MED ORDER — LACTATED RINGERS IV SOLN: INTRAVENOUS | Status: DC

## 2017-11-14 MED ORDER — SODIUM CHLORIDE 0.9 % IJ SOLN
INTRAMUSCULAR | Status: AC
  Filled 2017-11-14: qty 10

## 2017-11-14 MED ORDER — ACETAMINOPHEN 500 MG PO TABS
1000.0000 mg | ORAL_TABLET | Freq: Four times a day (QID) | ORAL | Status: DC
  Administered 2017-11-14 – 2017-11-17 (×8): 1000 mg via ORAL
  Filled 2017-11-14 (×9): qty 2

## 2017-11-14 MED ORDER — MIDAZOLAM HCL 2 MG/2ML IJ SOLN
INTRAMUSCULAR | Status: DC | PRN
  Administered 2017-11-14 (×2): 1 mg via INTRAVENOUS

## 2017-11-14 MED ORDER — PROPOFOL 10 MG/ML IV BOLUS
INTRAVENOUS | Status: AC
  Filled 2017-11-14: qty 20

## 2017-11-14 MED ORDER — SENNOSIDES-DOCUSATE SODIUM 8.6-50 MG PO TABS
2.0000 | ORAL_TABLET | Freq: Every day | ORAL | Status: DC
  Administered 2017-11-14 – 2017-11-16 (×3): 2 via ORAL
  Filled 2017-11-14 (×3): qty 2

## 2017-11-14 MED ORDER — KETOROLAC TROMETHAMINE 30 MG/ML IJ SOLN
15.0000 mg | Freq: Once | INTRAMUSCULAR | Status: AC
  Administered 2017-11-14: 15 mg via INTRAVENOUS

## 2017-11-14 MED ORDER — TRAMADOL HCL 50 MG PO TABS
100.0000 mg | ORAL_TABLET | Freq: Four times a day (QID) | ORAL | Status: DC | PRN
  Administered 2017-11-15: 100 mg via ORAL
  Filled 2017-11-14: qty 2

## 2017-11-14 MED ORDER — IBUPROFEN 200 MG PO TABS
600.0000 mg | ORAL_TABLET | Freq: Four times a day (QID) | ORAL | Status: DC
  Administered 2017-11-15 – 2017-11-16 (×3): 600 mg via ORAL
  Filled 2017-11-14 (×3): qty 3

## 2017-11-14 MED ORDER — ONDANSETRON HCL 4 MG/2ML IJ SOLN: 4.0000 mg | Freq: Four times a day (QID) | INTRAMUSCULAR | Status: DC | PRN

## 2017-11-14 MED ORDER — EPHEDRINE SULFATE 50 MG/ML IJ SOLN
INTRAMUSCULAR | Status: AC
  Filled 2017-11-14: qty 1

## 2017-11-14 MED ORDER — ONDANSETRON HCL 4 MG/2ML IJ SOLN
INTRAMUSCULAR | Status: DC | PRN
  Administered 2017-11-14: 4 mg via INTRAVENOUS

## 2017-11-14 MED ORDER — KCL IN DEXTROSE-NACL 20-5-0.45 MEQ/L-%-% IV SOLN
INTRAVENOUS | Status: DC
  Administered 2017-11-14: 17:00:00 via INTRAVENOUS
  Filled 2017-11-14 (×2): qty 1000

## 2017-11-14 MED ORDER — HYDROMORPHONE HCL 2 MG PO TABS
2.0000 mg | ORAL_TABLET | ORAL | Status: DC | PRN
  Administered 2017-11-14 – 2017-11-17 (×7): 2 mg via ORAL
  Filled 2017-11-14 (×7): qty 1

## 2017-11-14 MED ORDER — METOPROLOL TARTRATE 25 MG PO TABS
25.0000 mg | ORAL_TABLET | Freq: Two times a day (BID) | ORAL | Status: DC
  Administered 2017-11-14 – 2017-11-15 (×2): 25 mg via ORAL
  Filled 2017-11-14 (×3): qty 1

## 2017-11-14 MED ORDER — AMLODIPINE BESYLATE 10 MG PO TABS
10.0000 mg | ORAL_TABLET | Freq: Every day | ORAL | Status: DC
  Administered 2017-11-15 – 2017-11-17 (×3): 10 mg via ORAL
  Filled 2017-11-14 (×3): qty 1

## 2017-11-14 MED ORDER — PHENYLEPHRINE 40 MCG/ML (10ML) SYRINGE FOR IV PUSH (FOR BLOOD PRESSURE SUPPORT)
PREFILLED_SYRINGE | INTRAVENOUS | Status: AC
  Filled 2017-11-14: qty 10

## 2017-11-14 MED ORDER — SUGAMMADEX SODIUM 200 MG/2ML IV SOLN
INTRAVENOUS | Status: AC
  Filled 2017-11-14: qty 2

## 2017-11-14 MED ORDER — KETOROLAC TROMETHAMINE 15 MG/ML IJ SOLN
INTRAMUSCULAR | Status: AC
  Filled 2017-11-14: qty 1

## 2017-11-14 MED ORDER — ONDANSETRON HCL 4 MG PO TABS: 4.0000 mg | ORAL_TABLET | Freq: Four times a day (QID) | ORAL | Status: DC | PRN

## 2017-11-14 MED ORDER — CEFAZOLIN SODIUM-DEXTROSE 2-4 GM/100ML-% IV SOLN
2.0000 g | INTRAVENOUS | Status: AC
  Administered 2017-11-14: 2 g via INTRAVENOUS
  Filled 2017-11-14: qty 100

## 2017-11-14 MED ORDER — STERILE WATER FOR INJECTION IJ SOLN
INTRAMUSCULAR | Status: AC
  Filled 2017-11-14: qty 10

## 2017-11-14 MED ORDER — KETOROLAC TROMETHAMINE 15 MG/ML IJ SOLN
15.0000 mg | Freq: Four times a day (QID) | INTRAMUSCULAR | Status: AC
  Administered 2017-11-14 – 2017-11-15 (×3): 15 mg via INTRAVENOUS
  Filled 2017-11-14 (×3): qty 1

## 2017-11-14 MED ORDER — DEXAMETHASONE SODIUM PHOSPHATE 4 MG/ML IJ SOLN: 4.0000 mg | INTRAMUSCULAR | Status: DC

## 2017-11-14 MED ORDER — HYDRALAZINE HCL 10 MG PO TABS
10.0000 mg | ORAL_TABLET | Freq: Three times a day (TID) | ORAL | Status: DC
  Administered 2017-11-14 – 2017-11-17 (×7): 10 mg via ORAL
  Filled 2017-11-14 (×7): qty 1

## 2017-11-14 MED ORDER — DEXAMETHASONE SODIUM PHOSPHATE 10 MG/ML IJ SOLN
INTRAMUSCULAR | Status: DC | PRN
  Administered 2017-11-14: 4 mg via INTRAVENOUS

## 2017-11-14 MED ORDER — STERILE WATER FOR IRRIGATION IR SOLN
Status: DC | PRN
  Administered 2017-11-14: 1000 mL

## 2017-11-14 MED ORDER — HYDROMORPHONE HCL 1 MG/ML IJ SOLN
0.2500 mg | INTRAMUSCULAR | Status: DC | PRN
  Administered 2017-11-14: 0.5 mg via INTRAVENOUS

## 2017-11-14 MED ORDER — GABAPENTIN 300 MG PO CAPS
600.0000 mg | ORAL_CAPSULE | Freq: Every day | ORAL | Status: AC
  Administered 2017-11-14: 600 mg via ORAL
  Filled 2017-11-14: qty 2

## 2017-11-14 MED ORDER — FENTANYL CITRATE (PF) 250 MCG/5ML IJ SOLN
INTRAMUSCULAR | Status: AC
  Filled 2017-11-14: qty 5

## 2017-11-14 MED ORDER — PROPOFOL 10 MG/ML IV BOLUS
INTRAVENOUS | Status: DC | PRN
  Administered 2017-11-14: 200 mg via INTRAVENOUS

## 2017-11-14 MED ORDER — GABAPENTIN 300 MG PO CAPS
300.0000 mg | ORAL_CAPSULE | ORAL | Status: AC
  Administered 2017-11-14: 300 mg via ORAL
  Filled 2017-11-14: qty 1

## 2017-11-14 MED ORDER — SUGAMMADEX SODIUM 200 MG/2ML IV SOLN
INTRAVENOUS | Status: DC | PRN
  Administered 2017-11-14: 200 mg via INTRAVENOUS

## 2017-11-14 MED ORDER — LIDOCAINE 2% (20 MG/ML) 5 ML SYRINGE
INTRAMUSCULAR | Status: AC
  Filled 2017-11-14: qty 5

## 2017-11-14 MED ORDER — SCOPOLAMINE 1 MG/3DAYS TD PT72
1.0000 | MEDICATED_PATCH | TRANSDERMAL | Status: DC
  Administered 2017-11-14: 1.5 mg via TRANSDERMAL
  Filled 2017-11-14: qty 1

## 2017-11-14 MED ORDER — ENOXAPARIN SODIUM 40 MG/0.4ML ~~LOC~~ SOLN
40.0000 mg | SUBCUTANEOUS | Status: DC
  Administered 2017-11-15 – 2017-11-16 (×2): 40 mg via SUBCUTANEOUS
  Filled 2017-11-14 (×2): qty 0.4

## 2017-11-14 MED ORDER — DEXAMETHASONE SODIUM PHOSPHATE 10 MG/ML IJ SOLN
INTRAMUSCULAR | Status: AC
  Filled 2017-11-14: qty 1

## 2017-11-14 MED ORDER — PROMETHAZINE HCL 25 MG/ML IJ SOLN: 6.2500 mg | INTRAMUSCULAR | Status: DC | PRN

## 2017-11-14 MED ORDER — FENTANYL CITRATE (PF) 250 MCG/5ML IJ SOLN
INTRAMUSCULAR | Status: DC | PRN
  Administered 2017-11-14 (×4): 50 ug via INTRAVENOUS

## 2017-11-14 MED ORDER — HYDROMORPHONE HCL 1 MG/ML IJ SOLN
0.2000 mg | INTRAMUSCULAR | Status: DC | PRN
  Administered 2017-11-15 – 2017-11-16 (×3): 0.6 mg via INTRAVENOUS
  Filled 2017-11-14 (×3): qty 1

## 2017-11-14 MED ORDER — CELECOXIB 200 MG PO CAPS
400.0000 mg | ORAL_CAPSULE | ORAL | Status: AC
  Administered 2017-11-14: 400 mg via ORAL
  Filled 2017-11-14: qty 2

## 2017-11-14 MED ORDER — MEPERIDINE HCL 50 MG/ML IJ SOLN: 6.2500 mg | INTRAMUSCULAR | Status: DC | PRN

## 2017-11-14 MED ORDER — MIDAZOLAM HCL 2 MG/2ML IJ SOLN
INTRAMUSCULAR | Status: AC
  Filled 2017-11-14: qty 2

## 2017-11-14 MED ORDER — EPHEDRINE SULFATE 50 MG/ML IJ SOLN
INTRAMUSCULAR | Status: DC | PRN
  Administered 2017-11-14: 20 mg via INTRAVENOUS

## 2017-11-14 MED ORDER — ROCURONIUM BROMIDE 10 MG/ML (PF) SYRINGE
PREFILLED_SYRINGE | INTRAVENOUS | Status: DC | PRN
  Administered 2017-11-14: 50 mg via INTRAVENOUS

## 2017-11-14 MED ORDER — ONDANSETRON HCL 4 MG/2ML IJ SOLN
INTRAMUSCULAR | Status: AC
  Filled 2017-11-14: qty 2

## 2017-11-14 SURGICAL SUPPLY — 50 items
ADH SKN CLS APL DERMABOND .7 (GAUZE/BANDAGES/DRESSINGS) ×1
AGENT HMST KT MTR STRL THRMB (HEMOSTASIS)
APL ESCP 34 STRL LF DISP (HEMOSTASIS)
APPLICATOR SURGIFLO ENDO (HEMOSTASIS) IMPLANT
BAG LAPAROSCOPIC 12 15 PORT 16 (BASKET) IMPLANT
BAG RETRIEVAL 12/15 (BASKET)
BAG SPEC RTRVL LRG 6X4 10 (ENDOMECHANICALS) ×1
COVER BACK TABLE 60X90IN (DRAPES) ×2 IMPLANT
COVER TIP SHEARS 8 DVNC (MISCELLANEOUS) ×1 IMPLANT
COVER TIP SHEARS 8MM DA VINCI (MISCELLANEOUS) ×1
DERMABOND ADVANCED (GAUZE/BANDAGES/DRESSINGS) ×1
DERMABOND ADVANCED .7 DNX12 (GAUZE/BANDAGES/DRESSINGS) ×1 IMPLANT
DRAPE ARM DVNC X/XI (DISPOSABLE) ×4 IMPLANT
DRAPE COLUMN DVNC XI (DISPOSABLE) ×1 IMPLANT
DRAPE DA VINCI XI ARM (DISPOSABLE) ×4
DRAPE DA VINCI XI COLUMN (DISPOSABLE) ×1
DRAPE SHEET LG 3/4 BI-LAMINATE (DRAPES) ×2 IMPLANT
DRAPE SURG IRRIG POUCH 19X23 (DRAPES) ×2 IMPLANT
ELECT REM PT RETURN 15FT ADLT (MISCELLANEOUS) ×2 IMPLANT
GLOVE BIO SURGEON STRL SZ 6 (GLOVE) ×8 IMPLANT
GLOVE BIO SURGEON STRL SZ 6.5 (GLOVE) IMPLANT
GOWN STRL REUS W/ TWL LRG LVL3 (GOWN DISPOSABLE) ×2 IMPLANT
GOWN STRL REUS W/TWL LRG LVL3 (GOWN DISPOSABLE) ×4
HOLDER FOLEY CATH W/STRAP (MISCELLANEOUS) ×2 IMPLANT
IRRIG SUCT STRYKERFLOW 2 WTIP (MISCELLANEOUS) ×2
IRRIGATION SUCT STRKRFLW 2 WTP (MISCELLANEOUS) ×1 IMPLANT
KIT PROCEDURE DA VINCI SI (MISCELLANEOUS) ×1
KIT PROCEDURE DVNC SI (MISCELLANEOUS) IMPLANT
MANIPULATOR UTERINE 4.5 ZUMI (MISCELLANEOUS) ×2 IMPLANT
NDL SPNL 18GX3.5 QUINCKE PK (NEEDLE) IMPLANT
NEEDLE SPNL 18GX3.5 QUINCKE PK (NEEDLE) ×2 IMPLANT
OBTURATOR OPTICAL STANDARD 8MM (TROCAR) ×1
OBTURATOR OPTICAL STND 8 DVNC (TROCAR) ×1
OBTURATOR OPTICALSTD 8 DVNC (TROCAR) ×1 IMPLANT
PACK ROBOT GYN CUSTOM WL (TRAY / TRAY PROCEDURE) ×2 IMPLANT
PAD POSITIONING PINK XL (MISCELLANEOUS) ×2 IMPLANT
POUCH SPECIMEN RETRIEVAL 10MM (ENDOMECHANICALS) ×1 IMPLANT
SEAL CANN UNIV 5-8 DVNC XI (MISCELLANEOUS) ×4 IMPLANT
SEAL XI 5MM-8MM UNIVERSAL (MISCELLANEOUS) ×4
SET TRI-LUMEN FLTR TB AIRSEAL (TUBING) ×2 IMPLANT
SLEEVE SURGEON STRL (DRAPES) ×1 IMPLANT
SURGIFLO W/THROMBIN 8M KIT (HEMOSTASIS) IMPLANT
SUT VIC AB 0 CT1 27 (SUTURE)
SUT VIC AB 0 CT1 27XBRD ANTBC (SUTURE) IMPLANT
SYR 10ML LL (SYRINGE) ×1 IMPLANT
TOWEL OR NON WOVEN STRL DISP B (DISPOSABLE) ×2 IMPLANT
TRAP SPECIMEN MUCOUS 40CC (MISCELLANEOUS) IMPLANT
TRAY FOLEY MTR SLVR 16FR STAT (SET/KITS/TRAYS/PACK) ×2 IMPLANT
UNDERPAD 30X30 (UNDERPADS AND DIAPERS) ×2 IMPLANT
WATER STERILE IRR 1000ML POUR (IV SOLUTION) ×2 IMPLANT

## 2017-11-14 NOTE — Op Note (Signed)
OPERATIVE NOTE 11/14/17  Surgeon: Donaciano Eva   Assistants: Dr Lahoma Crocker (an MD assistant was necessary for tissue manipulation, management of robotic instrumentation, retraction and positioning due to the complexity of the case and hospital policies).   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 Pelvic adnexal adhesions between right ovary and fallopian tube and pelvic sidewall. Bladder adhesions from prior cesarean section. Grossly normal appearing ovaries. No apparent extrauterine disease. Multiple subserosal fibroids. Unilateral dye mapping to left. No suspicious lymph nodes.   Estimated Blood Loss:  less than 50 mL      Total IV Fluids: 800 ml         Specimens: uterus, cervix, bilateral tubes and ovaries, right pelvic lymph nodes, left external iliac sentinel lymph node.          Complications:  None; patient tolerated the procedure well.         Disposition: PACU - hemodynamically stable.  Procedure Details  The patient was seen in the Holding Room. The risks, benefits, complications, treatment options, and expected outcomes were discussed with the patient.  The patient concurred with the proposed plan, giving informed consent.  The site of surgery properly noted/marked. The patient was identified as Angela Gardner and the procedure verified as a Robotic-assisted hysterectomy with bilateral salpingo oophorectomy with SLN biopsy. A Time Out was held and the above information confirmed.  After induction of anesthesia, the patient was  draped and prepped in the usual sterile manner. Pt was placed in supine position after anesthesia and draped and prepped in the usual sterile manner. The abdominal drape was placed after the CholoraPrep had been allowed to dry for 3 minutes.  Her arms were tucked to her side with all appropriate precautions.  The shoulders were stabilized with padded shoulder blocks applied to the acromium processes.  The patient was placed in the semi-lithotomy position in [place].  The perineum was prepped with Betadine. The patient was then prepped. Foley catheter was placed.  A sterile speculum was placed in the vagina.  The cervix was grasped with a single-tooth tenaculum. 2mg  total of ICG was injected into the cervical stroma at 2 and 9 o'clock with 1cc injected at a 1cm and 8mm depth (concentration 0.5mg /ml) in all locations. The cervix was dilated with Kennon Rounds dilators.  The ZUMI uterine manipulator with a medium colpotomizer ring was placed without difficulty.  A pneum occluder balloon was placed over the manipulator.  OG tube placement was confirmed and to suction.   Next, a 5 mm skin incision was made 1 cm below the subcostal margin in the midclavicular line.  The 5 mm Optiview port and scope was used for direct entry.  Opening pressure was under 10 mm CO2.  The abdomen was insufflated and the findings were noted as above.   At this point and all points during the procedure, the patient's intra-abdominal pressure did not exceed 15 mmHg. Next, a 10 mm skin incision was made in the umbilicus and a right and left port was placed about 10 cm lateral to the robot port on the right and left side.  A fourth arm was placed in the  left lower quadrant 2 cm above and superior and medial to the anterior superior iliac spine.  All ports were placed under direct visualization.  The patient was placed in steep Trendelenburg.  Bowel was folded away into the upper abdomen.  The robot was docked in the normal manner.  The right  and left peritoneum were opened parallel to the IP ligament to open the retroperitoneal spaces bilaterally. The SLN mapping was performed in bilateral pelvic basins. The para rectal and paravesical spaces were opened up entirely with careful dissection below the level of the ureters bilaterally and to the depth of the uterine artery origin in order to skeletonize the uterine "web" and ensure visualization of all parametrial channels. The para-aortic basins were carefully exposed and evaluated for isolated para-aortic SLN's. Lymphatic channels were identified travelling to the following visualized sentinel lymph node's: left external iliac SLN. There was unilateral mapping to the left and therefore a right sided lymphadenectomy was performed. These SLN's were separated from their surrounding lymphatic tissue, removed and sent for permanent pathology.  The paravesical space was developed with monopolar and sharp dissection. It was held open with tension on the median umbilical ligament with the forth arm. The paravesical space was opened with blunt and sharp dissection to mobilize the ureter off of the medial surface of the internal iliac artery. The medial leaf of the broad ligament containing the ureter was held medially (opening the pararectal space) by the assistant's grasper. The right pelvic lymphadenectomy was performed by skeletonizing the internal iliac artery at the bifurcation with the external iliac artery. The obturator nerve was identified in the base of lateral paravesical space. The ureter was mobilized medially off of the dissection by developing the pararectal space. The genitofemoral nerve was identified, skeletonized and mobilized laterally off of the external iliac artery. An enbloc resection of lymph nodes was performed within the following boundaries: the mid portion of the common iliac proximally, the circumflex iliac vein distally, the obturator nerve posteriorally, the genitofemoral nerve  laterally. The nodal basin (including obturator space) were confirmed to be empty of nodes and hemostatic. The nodes were placed in an endocatch bag and retrieved vaginally.  The hysterectomy was started after the round ligament on the right side was incised and the retroperitoneum was entered and the pararectal space was developed.  The ureter was noted to be on the medial leaf of the broad ligament.  The peritoneum above the ureter was incised and stretched and the infundibulopelvic ligament was skeletonized, cauterized and cut.  The posterior peritoneum was taken down to the level of the KOH ring.  The anterior peritoneum was also taken down.  The bladder flap was created to the level of the KOH ring.  The uterine artery on the right side was skeletonized, cauterized and cut in the normal manner.  A similar procedure was performed on the left.  The colpotomy was made and the uterus, cervix, bilateral ovaries and tubes were amputated and delivered through the vagina.  Pedicles were inspected and excellent hemostasis was achieved.    The colpotomy at the vaginal cuff was closed with Vicryl on a CT1 needle in a running manner.  Irrigation was used and excellent hemostasis was achieved.  At this point in the procedure was completed.  Robotic instruments were removed under direct visulaization.  The robot was undocked. The 10 mm ports were closed with Vicryl on a UR-5 needle and the fascia was closed with 0 Vicryl on a UR-5 needle.  The  skin was closed with 4-0 Vicryl in a subcuticular manner.  Dermabond was applied.  Sponge, lap and needle counts correct x 2.  The patient was taken to the recovery room in stable condition.  The vagina was swabbed with  minimal bleeding noted.   All instrument and needle counts were correct x  3.   The patient was transferred to the recovery room in a stable condition.  Donaciano Eva, MD

## 2017-11-14 NOTE — Anesthesia Procedure Notes (Signed)
Procedure Name: Intubation Date/Time: 11/14/2017 1:06 PM Performed by: Cynda Familia, CRNA Pre-anesthesia Checklist: Patient identified, Emergency Drugs available, Suction available and Patient being monitored Patient Re-evaluated:Patient Re-evaluated prior to induction Oxygen Delivery Method: Circle System Utilized Preoxygenation: Pre-oxygenation with 100% oxygen Induction Type: IV induction Ventilation: Mask ventilation without difficulty Laryngoscope Size: Miller and 2 Grade View: Grade I Tube type: Oral Number of attempts: 1 Airway Equipment and Method: Stylet Placement Confirmation: ETT inserted through vocal cords under direct vision,  positive ETCO2 and breath sounds checked- equal and bilateral Secured at: 22 cm Tube secured with: Tape Dental Injury: Teeth and Oropharynx as per pre-operative assessment  Comments: Smooth IV induction hollis--- intubation AM CRNA atraumatic--- teeth and mouth as preop--- many chipped teeth, broken teeth and missing teeth prior to laryngoscopy-- bilat BS [employer]

## 2017-11-14 NOTE — Telephone Encounter (Signed)
Called Angela Gardner and verified that pre-surgical testing had called her about surgery today.  She said they did call her and told her to arrive at 59 today.

## 2017-11-14 NOTE — Interval H&P Note (Signed)
History and Physical Interval Note:  11/14/2017 12:13 PM  Angela Gardner  has presented today for surgery, with the diagnosis of ENDOMETRIAL CANCER  The various methods of treatment have been discussed with the patient and family. After consideration of risks, benefits and other options for treatment, the patient has consented to  Procedure(s): XI ROBOTIC ASSISTED TOTAL HYSTERECTOMY WITH BILATERAL SALPINGO OOPHORECTOMY (N/A) SENTINEL LYMPH NODE BIOPSY (N/A) POSSIBLE MINI LAPAROTOMY (N/A) as a surgical intervention .  The patient's history has been reviewed, patient examined, no change in status, stable for surgery. She underwent a cardiac stress test which was acceptable per cardiology to proceed with surgery. I have reviewed the patient's chart and labs.  Questions were answered to the patient's satisfaction.     Thereasa Solo

## 2017-11-14 NOTE — Anesthesia Postprocedure Evaluation (Signed)
Anesthesia Post Note  Patient: Angela Gardner  Procedure(s) Performed: XI ROBOTIC ASSISTED TOTAL HYSTERECTOMY WITH BILATERAL SALPINGO OOPHORECTOMY, RIGHT PELVIC LYMPHADENECTOMY (N/A ) SENTINEL LYMPH NODE BIOPSY (N/A )     Patient location during evaluation: PACU Anesthesia Type: General Level of consciousness: awake and alert Pain management: pain level controlled Vital Signs Assessment: post-procedure vital signs reviewed and stable Respiratory status: spontaneous breathing, nonlabored ventilation, respiratory function stable and patient connected to nasal cannula oxygen Cardiovascular status: blood pressure returned to baseline and stable Postop Assessment: no apparent nausea or vomiting Anesthetic complications: no    Last Vitals:  Vitals:   11/14/17 1600 11/14/17 1611  BP: 123/82 128/78  Pulse: (!) 56 (!) 55  Resp: 16 18  Temp: 36.4 C 36.4 C  SpO2: 98% 100%    Last Pain:  Vitals:   11/14/17 1611  TempSrc: Oral  PainSc:                  [name]

## 2017-11-14 NOTE — Transfer of Care (Signed)
Immediate Anesthesia Transfer of Care Note  Patient: Angela Gardner  Procedure(s) Performed: XI ROBOTIC ASSISTED TOTAL HYSTERECTOMY WITH BILATERAL SALPINGO OOPHORECTOMY, RIGHT PELVIC LYMPHADENECTOMY (N/A ) SENTINEL LYMPH NODE BIOPSY (N/A )  Patient Location: PACU  Anesthesia Type:General  Level of Consciousness: sedated  Airway & Oxygen Therapy: Patient Spontanous Breathing and Patient connected to face mask oxygen  Post-op Assessment: Report given to RN and Post -op Vital signs reviewed and stable  Post vital signs: Reviewed and stable  Last Vitals:  Vitals Value Taken Time  BP 132/79 11/14/2017  3:01 PM  Temp    Pulse 60 11/14/2017  3:03 PM  Resp 19 11/14/2017  3:03 PM  SpO2 100 % 11/14/2017  3:03 PM  Vitals shown include unvalidated device data.  Last Pain:  Vitals:   11/14/17 1219  TempSrc:   PainSc: 0-No pain      Patients Stated Pain Goal: 4 (16/24/46 9507)  Complications: No apparent anesthesia complications

## 2017-11-14 NOTE — Anesthesia Procedure Notes (Signed)
Date/Time: 11/14/2017 2:57 PM Performed by: Cynda Familia, CRNA Oxygen Delivery Method: Simple face mask Placement Confirmation: positive ETCO2 and breath sounds checked- equal and bilateral Dental Injury: Teeth and Oropharynx as per pre-operative assessment

## 2017-11-14 NOTE — Anesthesia Preprocedure Evaluation (Addendum)
Anesthesia Evaluation  Patient identified by MRN, date of birth, ID band Patient awake    Reviewed: Allergy & Precautions, NPO status , Patient's Chart, lab work & pertinent test results  Airway Mallampati: II  TM Distance: >3 FB Neck ROM: Full    Dental  (+) Dental Advisory Given, Chipped, Poor Dentition,    Pulmonary shortness of breath, former smoker,    breath sounds clear to auscultation       Cardiovascular hypertension, Pt. on medications and Pt. on home beta blockers +CHF   Rhythm:Regular Rate:Normal     Neuro/Psych negative neurological ROS  negative psych ROS   GI/Hepatic negative GI ROS, Neg liver ROS,   Endo/Other    Renal/GU Renal disease     Musculoskeletal negative musculoskeletal ROS (+)   Abdominal (+) + obese,   Peds  Hematology   Anesthesia Other Findings - HLD  Reproductive/Obstetrics                            Anesthesia Physical Anesthesia Plan  ASA: III  Anesthesia Plan: General   Post-op Pain Management:    Induction: Intravenous  PONV Risk Score and Plan: 3 and Ondansetron, Dexamethasone and Midazolam  Airway Management Planned: Oral ETT  Additional Equipment: None  Intra-op Plan:   Post-operative Plan: Extubation in OR  Informed Consent:   Plan Discussed with: CRNA  Anesthesia Plan Comments:         Anesthesia Quick Evaluation

## 2017-11-15 ENCOUNTER — Encounter (HOSPITAL_COMMUNITY): Payer: Self-pay | Admitting: Gynecologic Oncology

## 2017-11-15 DIAGNOSIS — C541 Malignant neoplasm of endometrium: Secondary | ICD-10-CM | POA: Diagnosis not present

## 2017-11-15 LAB — BASIC METABOLIC PANEL
Anion gap: 8 (ref 5–15)
Anion gap: 9 (ref 5–15)
BUN: 22 mg/dL — ABNORMAL HIGH (ref 6–20)
BUN: 32 mg/dL — AB (ref 6–20)
CHLORIDE: 109 mmol/L (ref 101–111)
CO2: 20 mmol/L — AB (ref 22–32)
CO2: 20 mmol/L — ABNORMAL LOW (ref 22–32)
CREATININE: 1.59 mg/dL — AB (ref 0.44–1.00)
Calcium: 8.4 mg/dL — ABNORMAL LOW (ref 8.9–10.3)
Calcium: 8.5 mg/dL — ABNORMAL LOW (ref 8.9–10.3)
Chloride: 109 mmol/L (ref 101–111)
Creatinine, Ser: 2.44 mg/dL — ABNORMAL HIGH (ref 0.44–1.00)
GFR calc Af Amer: 41 mL/min — ABNORMAL LOW (ref >60)
GFR calc Af Amer: 25 mL/min — ABNORMAL LOW (ref >60)
GFR calc non Af Amer: 36 mL/min — ABNORMAL LOW (ref >60)
GFR, EST NON AFRICAN AMERICAN: 21 mL/min — AB (ref >60)
Glucose, Bld: 132 mg/dL — ABNORMAL HIGH (ref 65–99)
Glucose, Bld: 117 mg/dL — ABNORMAL HIGH (ref 65–99)
POTASSIUM: 4.6 mmol/L (ref 3.5–5.1)
POTASSIUM: 3.9 mmol/L (ref 3.5–5.1)
SODIUM: 138 mmol/L (ref 135–145)
Sodium: 137 mmol/L (ref 135–145)

## 2017-11-15 LAB — CBC
HCT: 33.2 % — ABNORMAL LOW (ref 36.0–46.0)
HEMATOCRIT: 35.2 % — AB (ref 36.0–46.0)
HEMOGLOBIN: 11.4 g/dL — AB (ref 12.0–15.0)
Hemoglobin: 10.8 g/dL — ABNORMAL LOW (ref 12.0–15.0)
MCH: 28.6 pg (ref 26.0–34.0)
MCH: 28.9 pg (ref 26.0–34.0)
MCHC: 32.4 g/dL (ref 30.0–36.0)
MCHC: 32.5 g/dL (ref 30.0–36.0)
MCV: 88.4 fL (ref 78.0–100.0)
MCV: 88.8 fL (ref 78.0–100.0)
PLATELETS: 296 10*3/uL (ref 150–400)
Platelets: 312 10*3/uL (ref 150–400)
RBC: 3.98 MIL/uL (ref 3.87–5.11)
RBC: 3.74 MIL/uL — AB (ref 3.87–5.11)
RDW: 14.7 % (ref 11.5–15.5)
RDW: 15 % (ref 11.5–15.5)
WBC: 7.3 10*3/uL (ref 4.0–10.5)
WBC: 7.8 10*3/uL (ref 4.0–10.5)

## 2017-11-15 MED ORDER — FUROSEMIDE 10 MG/ML IJ SOLN
10.0000 mg | Freq: Once | INTRAMUSCULAR | Status: AC
  Administered 2017-11-15: 10 mg via INTRAVENOUS
  Filled 2017-11-15: qty 2

## 2017-11-15 MED ORDER — SODIUM CHLORIDE 0.9 % IV BOLUS
1000.0000 mL | Freq: Once | INTRAVENOUS | Status: AC
  Administered 2017-11-15: 1000 mL via INTRAVENOUS

## 2017-11-15 NOTE — Progress Notes (Signed)
1 Day Post-Op Procedure(s) (LRB): XI ROBOTIC ASSISTED TOTAL HYSTERECTOMY WITH BILATERAL SALPINGO OOPHORECTOMY, RIGHT PELVIC LYMPHADENECTOMY (N/A) SENTINEL LYMPH NODE BIOPSY (N/A)  Subjective: Patient reports moderate abdominal discomfort , more with movement, and dizziness when sitting on the side of bed and with attempts to ambulate. Due to void since foley removal.  Tolerated small amount of solid food this am with no nausea or emesis reported.  States the pain medication is not offering relief of her pain.  Asking why is she so dizzy.  Denies passing flatus.  Denies chest pain, dyspnea.  No other concerns voiced.      Objective: Vital signs in last 24 hours: Temp:  [97.5 F (36.4 C)-99.2 F (37.3 C)] 99.2 F (37.3 C) (05/29 1006) Pulse Rate:  [50-68] 54 (05/29 1006) Resp:  [15-20] 20 (05/29 1006) BP: (119-145)/(69-91) 134/75 (05/29 1006) SpO2:  [97 %-100 %] 100 % (05/29 1006) Weight:  [226 lb (102.5 kg)] 226 lb (102.5 kg) (05/28 1219) Last BM Date: 11/13/17  Intake/Output from previous day: 05/28 0701 - 05/29 0700 In: 2006.7 [P.O.:430; I.V.:1576.7] Out: 950 [Urine:900; Blood:50]  Physical Examination: General: cooperative, no distress and sleeping quietly, responding to verbal stimuli Resp: clear to auscultation bilaterally Cardio: bradycardic, regular rhythm, no clicks, rubs, or murmurs noted GI: incision: lap sites to the abdomen with dermabond without drainage and abdomen obese, slightly distended, active bowel sounds, tympanic on percussion, tender with light palpation Extremities: extremities normal, atraumatic, no cyanosis or edema  Labs: WBC/Hgb/Hct/Plts:  7.3/11.4/35.2/312 (05/29 0419) BUN/Cr/glu/ALT/AST/amyl/lip:  22/1.59/--/--/--/--/-- (05/29 0419)  Assessment: 57 y.o. s/p Procedure(s): XI ROBOTIC ASSISTED TOTAL HYSTERECTOMY WITH BILATERAL SALPINGO OOPHORECTOMY, RIGHT PELVIC LYMPHADENECTOMY SENTINEL LYMPH NODE BIOPSY: stable Pain:  Pain is not well-controlled on  PRN medications.  Patient has had tramadol prn this am.  No oral dilaudid has been administered as needed.  Heme: Hgb 11.4 and Hct 35.2 this am.  Appropriate compared to pre-op labs and surgical blood loss.  CV: Bradycardic at 54. Hx CHF- metoprolol, hydralazine, norvasc ordered.  Will discuss potential parameters with Dr. Denman George.    GI:  Tolerating po: Yes. Scopolamine patch in place and antiemetics ordered PRN.  GU: Due to void since foley removal.  Adequate output documented.  Creatinine 1.59 this am.  FEN: Bmet reviewed this am.  Prophylaxis: pharmacologic prophylaxis (with any of the following: enoxaparin (Lovenox) 40mg  SQ 2 hours prior to surgery then every day) and intermittent pneumatic compression boots.  Plan: Orthostatic vital signs Kpad for symptom relief RN to offer oral dilaudid to attempt better pain control Abdominal binder for additional support IS to the room Encourage ambulation, IS use, deep breathing, and coughing Continue plan of care per Dr. Denman George. Possible discharge later today if pain/dizziness improves.    LOS: 0 days    Dorothyann Gibbs 11/15/2017, 11:45 AM

## 2017-11-16 ENCOUNTER — Ambulatory Visit (HOSPITAL_COMMUNITY): Payer: PRIVATE HEALTH INSURANCE

## 2017-11-16 ENCOUNTER — Telehealth: Payer: Self-pay | Admitting: *Deleted

## 2017-11-16 ENCOUNTER — Telehealth: Payer: Self-pay

## 2017-11-16 DIAGNOSIS — C541 Malignant neoplasm of endometrium: Secondary | ICD-10-CM | POA: Diagnosis not present

## 2017-11-16 LAB — CBC WITH DIFFERENTIAL/PLATELET
BASOS PCT: 0 %
Basophils Absolute: 0 10*3/uL (ref 0.0–0.1)
EOS PCT: 1 %
Eosinophils Absolute: 0.1 10*3/uL (ref 0.0–0.7)
HEMATOCRIT: 31.9 % — AB (ref 36.0–46.0)
Hemoglobin: 10 g/dL — ABNORMAL LOW (ref 12.0–15.0)
Lymphocytes Relative: 38 %
Lymphs Abs: 2.6 10*3/uL (ref 0.7–4.0)
MCH: 28.5 pg (ref 26.0–34.0)
MCHC: 31.3 g/dL (ref 30.0–36.0)
MCV: 90.9 fL (ref 78.0–100.0)
MONO ABS: 0.6 10*3/uL (ref 0.1–1.0)
Monocytes Relative: 9 %
NEUTROS ABS: 3.6 10*3/uL (ref 1.7–7.7)
Neutrophils Relative %: 52 %
Platelets: 267 10*3/uL (ref 150–400)
RBC: 3.51 MIL/uL — ABNORMAL LOW (ref 3.87–5.11)
RDW: 15.4 % (ref 11.5–15.5)
WBC: 6.8 10*3/uL (ref 4.0–10.5)

## 2017-11-16 LAB — BASIC METABOLIC PANEL
ANION GAP: 7 (ref 5–15)
Anion gap: 5 (ref 5–15)
BUN: 35 mg/dL — AB (ref 6–20)
BUN: 36 mg/dL — AB (ref 6–20)
CALCIUM: 8.5 mg/dL — AB (ref 8.9–10.3)
CHLORIDE: 112 mmol/L — AB (ref 101–111)
CO2: 20 mmol/L — ABNORMAL LOW (ref 22–32)
CO2: 19 mmol/L — ABNORMAL LOW (ref 22–32)
Calcium: 8.5 mg/dL — ABNORMAL LOW (ref 8.9–10.3)
Chloride: 113 mmol/L — ABNORMAL HIGH (ref 101–111)
Creatinine, Ser: 2.43 mg/dL — ABNORMAL HIGH (ref 0.44–1.00)
Creatinine, Ser: 2.24 mg/dL — ABNORMAL HIGH (ref 0.44–1.00)
GFR calc Af Amer: 25 mL/min — ABNORMAL LOW (ref >60)
GFR calc Af Amer: 27 mL/min — ABNORMAL LOW (ref >60)
GFR, EST NON AFRICAN AMERICAN: 21 mL/min — AB (ref >60)
GFR, EST NON AFRICAN AMERICAN: 23 mL/min — AB (ref >60)
GLUCOSE: 103 mg/dL — AB (ref 65–99)
GLUCOSE: 109 mg/dL — AB (ref 65–99)
POTASSIUM: 3.6 mmol/L (ref 3.5–5.1)
Potassium: 4.2 mmol/L (ref 3.5–5.1)
Sodium: 140 mmol/L (ref 135–145)
Sodium: 136 mmol/L (ref 135–145)

## 2017-11-16 LAB — HEMOGLOBIN AND HEMATOCRIT, BLOOD
HEMATOCRIT: 31.5 % — AB (ref 36.0–46.0)
Hemoglobin: 10.2 g/dL — ABNORMAL LOW (ref 12.0–15.0)

## 2017-11-16 MED ORDER — HYDROMORPHONE HCL 2 MG PO TABS: 2.0000 mg | ORAL_TABLET | ORAL | 0 refills | Status: DC | PRN

## 2017-11-16 NOTE — Progress Notes (Signed)
S: patient was trying to get to the bathroom. She felt dizzy and tripped over her gown.  She was found on her right side. Never lost conciousness. Thinks she hit her right knee/hip. Doesn't think she hit her head but then about 1 hour later started seeing white specks and things crawling.No headache. Took a pain pill one hour before event.  O: Vitals:   11/16/17 0847 11/16/17 1359 11/16/17 1850 11/16/17 2116  BP: 121/68 134/78 (!) 146/76 (!) 159/82  Pulse: (!) 50 (!) 56 (!) 57 (!) 53  Resp: 16 (!) 24  18  Temp: (!) 97.5 F (36.4 C) 97.8 F (36.6 C) (!) 97.4 F (36.3 C) 98.1 F (36.7 C)  TempSrc: Oral Oral Oral Oral  SpO2: 98% 100% 99% 100%  Weight:      Height:       Lungs CTA CV brady, murmur. Reg rhythm Abdo soft, NTND, +BS Ext some tenderness right lateral knee, no swelling or ecchymosis.   A/P S/p Fall - CT Head negative Xray right knee/right femur/right hip in am. Suspect bradycardia chronic based on preop EKG.  If dizziness persists may need further evaluation by her cardiologist?

## 2017-11-16 NOTE — Plan of Care (Signed)
Pt alert and oriented, voiding on own post foley removal.  Pain controlled with PO pain meds, dizziness improving, pt up in chair all afternoon.  Plan to d/c per MD order.

## 2017-11-16 NOTE — Progress Notes (Signed)
Patient alert, oriented, sitting in the chair quietly, in no acute distress.  Stating she feels pretty good and just passed flatus.  She is still due to void since foley removal. Denies chest pain or dyspnea.  RN stating patient ambulated in the halls and reported dizziness.  Per Dr. Gerarda Fraction, if patient is able to void and still doing well, plan to discharge later today.

## 2017-11-16 NOTE — Telephone Encounter (Signed)
Called Dr. Wendy Poet office and scheduled an appt for the patient to see him on 51 at 11am

## 2017-11-16 NOTE — Progress Notes (Signed)
2 Days Post-Op Procedure(s) (LRB): XI ROBOTIC ASSISTED TOTAL HYSTERECTOMY WITH BILATERAL SALPINGO OOPHORECTOMY, RIGHT PELVIC LYMPHADENECTOMY (N/A) SENTINEL LYMPH NODE BIOPSY (N/A)  Subjective: Patient reports doing better this am.  Tolerated toast and bacon this am with no nausea or emesis.  Ambulated with assist but states the dizziness is about the same compared to yesterday.  Reporting intermittent abdominal cramping.  No flatus or BM reported.  Denies chest pain or dyspnea.  States she feels congested in her upper chest but no cough reported.  No concerns voiced.    Objective: Vital signs in last 24 hours: Temp:  [97.5 F (36.4 C)-98.7 F (37.1 C)] 97.5 F (36.4 C) (05/30 0847) Pulse Rate:  [44-51] 50 (05/30 0847) Resp:  [16-20] 16 (05/30 0847) BP: (112-123)/(61-76) 121/68 (05/30 0847) SpO2:  [96 %-100 %] 98 % (05/30 0847) Last BM Date: 11/13/17  Intake/Output from previous day: 05/29 0701 - 05/30 0700 In: 2250.8 [P.O.:1040; I.V.:1210.8] Out: 850 [Urine:850]  Physical Examination: General: alert, cooperative and grimacing intermittently with abdominal cramping, no distress Resp: crackles noted in the upper lobes bilaterally, diminished in the bases Cardio: bradycardic, regular rhythm GI: incision: lap sites to the abdomen with dermabond without erythema or drainage and abdomen slightly distended, active bowel sounds, tympanic Extremities: extremities normal, atraumatic, no cyanosis or edema  Labs: WBC/Hgb/Hct/Plts:  6.8/10.0/31.9/267 (05/30 1761) BUN/Cr/glu/ALT/AST/amyl/lip:  35/2.43/--/--/--/--/-- (05/30 6073)  Assessment: 57 y.o. s/p Procedure(s): XI ROBOTIC ASSISTED TOTAL HYSTERECTOMY WITH BILATERAL SALPINGO OOPHORECTOMY, RIGHT PELVIC LYMPHADENECTOMY SENTINEL LYMPH NODE BIOPSY: stable Pain:  Pain is well-controlled on PRN medications.  Heme: Hbg 10.0 and Hct 31.9 this am. Stable compared with previous values. Plan to repeat at noon per Dr. Denman George.  CV: Bradycardic and  hypotensive at times.  Discussed with Dr. Joycelyn Rua, discontinue metoprolol.   GI:  Tolerating po: Yes. Scop path in place and antiemetics ordered PRN if needed.  GU: Acute kidney injury in setting of CKD s/p robotic hyst/bso/sln biopsy for endometrial cancer. Foley in place with 850 cc output overnight.  Lasix IV 10 mg given 11/15/17 in pm.  Creatinine 2.43 this am.  Plan for repeat at noon.  FEN: Stable this am compared with previous values.  Prophylaxis: SCDs. Lovenox discontinued to elevated creatinine.  Plan: Repeat H&H and Bmet at 12 noon Discontinue metoprolol Discontinue foley Chest 2 view to evaluate for fluid overload If O2 saturation less than 97%, consider 20 mg of lasix per Dr. Denman George (currently sats 98-99) Daily weights Encourage ambulation, IS use, deep breathing, and coughing Continue plan of care per Dr. Gerarda Fraction and Dr. Denman George    LOS: 0 days    Angela Gardner D Danyel Tobey 11/16/2017, 11:43 AM

## 2017-11-16 NOTE — Progress Notes (Signed)
NT came to help pt get dressed for discharge.  She found the patient on the floor when she entered the room.  Pt states she felt dizzy when she got up and got tangled in her gown.  She denies hitting her head, and states she landed on her R hip and knee.  Pt assessed, and no cognitive changes noted.  MD notified, verbal orders for x-rays given.  Hip appears swollen and pt complaints of pain in the hip.  Denies headache.  RN will monitor. Vitals stable.

## 2017-11-16 NOTE — Progress Notes (Addendum)
RN went to check on pt and she states she is now seeing white specks. States she is feeling dizzy and is having some delayed. MD paged.   Order for stat head CT given.

## 2017-11-16 NOTE — Telephone Encounter (Signed)
Patient had gynecology surgery on Tuesday that Dr. Agustin Cree cleared the patient for. Barbaraann Share, nurse for Dr. Everitt Amber, states the surgery went well with no complications. Now, creatinine: 2.3, bradycardic at 44-50 beats per minute, blood pressure [id number] systolic and 35-39 diastolic, patient is dizzy when she gets out of bed. Current medications managed by Dr. Agustin Cree: amlodpine 10 mg daily, hydralazine 10 mg three times daily, metoprolol tartrate 25 mg twice daily. This morning Joylene John, NP advised to hold metoprolol.   Louise calling because Dr. Denman George wants to know if any other medication changes can be made. She did not feel a cardiology consult was needed at this time.  Reviewed above information with Dr. Agustin Cree. Advised to stop metoprolol tartrate completely and consult cardiology if no improvement. Louise verbalized understanding of instructions and will relay to Dr. Denman George. No further questions.

## 2017-11-17 ENCOUNTER — Ambulatory Visit (HOSPITAL_COMMUNITY): Payer: PRIVATE HEALTH INSURANCE

## 2017-11-17 ENCOUNTER — Encounter: Payer: Self-pay | Admitting: Oncology

## 2017-11-17 DIAGNOSIS — C541 Malignant neoplasm of endometrium: Secondary | ICD-10-CM | POA: Diagnosis not present

## 2017-11-17 LAB — CBC
HEMATOCRIT: 33.5 % — AB (ref 36.0–46.0)
HEMOGLOBIN: 10.8 g/dL — AB (ref 12.0–15.0)
MCH: 28.9 pg (ref 26.0–34.0)
MCHC: 32.2 g/dL (ref 30.0–36.0)
MCV: 89.6 fL (ref 78.0–100.0)
Platelets: 261 10*3/uL (ref 150–400)
RBC: 3.74 MIL/uL — ABNORMAL LOW (ref 3.87–5.11)
RDW: 15 % (ref 11.5–15.5)
WBC: 5.2 10*3/uL (ref 4.0–10.5)

## 2017-11-17 LAB — BASIC METABOLIC PANEL
Anion gap: 6 (ref 5–15)
BUN: 23 mg/dL — ABNORMAL HIGH (ref 6–20)
CHLORIDE: 110 mmol/L (ref 101–111)
CO2: 23 mmol/L (ref 22–32)
Calcium: 8.7 mg/dL — ABNORMAL LOW (ref 8.9–10.3)
Creatinine, Ser: 1.59 mg/dL — ABNORMAL HIGH (ref 0.44–1.00)
GFR, EST AFRICAN AMERICAN: 41 mL/min — AB (ref >60)
GFR, EST NON AFRICAN AMERICAN: 36 mL/min — AB (ref >60)
GLUCOSE: 122 mg/dL — AB (ref 65–99)
Potassium: 3.7 mmol/L (ref 3.5–5.1)
SODIUM: 139 mmol/L (ref 135–145)

## 2017-11-17 NOTE — Progress Notes (Signed)
Pt alert and oriented, discussed d/c paperwork with pt. She demonstrated understanding and questions were answered.  IV removed and Pt wheeled down by CNA to front lobby in stable condition.

## 2017-11-17 NOTE — Progress Notes (Signed)
3 Days Post-Op Procedure(s) (LRB): XI ROBOTIC ASSISTED TOTAL HYSTERECTOMY WITH BILATERAL SALPINGO OOPHORECTOMY, RIGHT PELVIC LYMPHADENECTOMY (N/A) SENTINEL LYMPH NODE BIOPSY (N/A)  Subjective: Patient reports doing better this am. Intermittent abdominal cramping improved after passing flatus.  No BM reported.  Tolerating diet with no nausea or emesis reported.  Stating she was up all night urinating using the bedside commode.  She had a fall and hit against the bed on her right side.  She states she became dizzy when getting up last evening and got tangled up in her gown and lost her footing.  She states after the fall she started seeing things moving and seeing white lights.  She does report that she had seen similar visual findings before surgery for the past several months.  She is voiding without difficulty.  States she notices dizziness when changing positions fast so she has been trying to take things slow.  Denies chest pain, dyspnea.  States she feels the chest congestion is improving.  No cough reported.  Reporting right hip and knee soreness but able to move her extremities freely.  No concerns voiced.  Objective: Vital signs in last 24 hours: Temp:  [97.4 F (36.3 C)-98.6 F (37 C)] 98.5 F (36.9 C) (05/31 0852) Pulse Rate:  [53-67] 67 (05/31 0852) Resp:  [18-24] 18 (05/31 0852) BP: (134-159)/(73-82) 155/77 (05/31 0852) SpO2:  [96 %-100 %] 100 % (05/31 0852) Weight:  [237 lb 7 oz (107.7 kg)] 237 lb 7 oz (107.7 kg) (05/31 0508) Last BM Date: 11/13/17  Intake/Output from previous day: 05/30 0701 - 05/31 0700 In: 1177.3 [P.O.:750; I.V.:427.3] Out: 600 [Urine:600]  Physical Examination: General: alert, cooperative and no distress Resp: crackles resolved, diminished in the bases Cardio: bradycardic, regular rhythm GI: incision: lap sites to the abdomen with dermabond without erythema or drainage and abdomen soft active bowel sounds, less tympanic Extremities: extremities normal,  atraumatic, no cyanosis or edema  Right hip laterally slightly edematous, no ecchymosis or hematoma noted.    Labs: WBC/Hgb/Hct/Plts:  5.2/10.8/33.5/261 (05/31 0845) BUN/Cr/glu/ALT/AST/amyl/lip:  23/1.59/--/--/--/--/-- (05/31 0845)  Assessment: 57 y.o. s/p Procedure(s): XI ROBOTIC ASSISTED TOTAL HYSTERECTOMY WITH BILATERAL SALPINGO OOPHORECTOMY, RIGHT PELVIC LYMPHADENECTOMY SENTINEL LYMPH NODE BIOPSY: stable Pain:  Pain is well-controlled on PRN medications.  Heme: Hbg 10.8 and Hct 33.5 this am. Stable compared with previous values.   CV: Bradycardic.   GI:  Tolerating po: Yes. Scop path in place and antiemetics ordered PRN if needed.  GU: Acute kidney injury in setting of CKD s/p robotic hyst/bso/sln biopsy for endometrial cancer. Creatinine improved this am at 1.59.  Voiding adequate amounts without difficulty.  FEN: Stable this am compared with previous values.  Prophylaxis: SCDs. Lovenox discontinued to elevated creatinine.  Plan: AM labs ordered Diet as tolerated Follow up on xrays this am Encourage ambulation, IS use, deep breathing, and coughing Discharge later this am after patient cleared by Dr. Denman George   LOS: 0 days    Lenna Sciara D [place] Pulse 11/17/2017, 11:21 AM

## 2017-11-17 NOTE — Progress Notes (Signed)
Pt taken off unit to xray 

## 2017-11-17 NOTE — Plan of Care (Signed)
Pt alert and oriented.  States her dizziness is better today, and she is ambulating better this am.  Voiding well and tolerating diet with no n/v. Pain controlled with PO dilaudid, RN will monitor.

## 2017-11-17 NOTE — Discharge Summary (Addendum)
Physician Discharge Summary  Patient ID: Angela Gardner MRN: [id number] DOB/AGE: 03-15-1961 57 y.o.  Admit date: 11/14/2017 Discharge date: 11/17/2017  Admission Diagnoses: Endometrial cancer [facility])  Discharge Diagnoses:  Principal Problem:   Endometrial cancer [facility])   Discharged Condition:  The patient is in good condition and stable for discharge.    Hospital Course: On 11/14/2017, the patient underwent the following: Procedure(s): XI ROBOTIC ASSISTED TOTAL HYSTERECTOMY WITH BILATERAL SALPINGO OOPHORECTOMY, RIGHT PELVIC LYMPHADENECTOMY SENTINEL LYMPH NODE BIOPSY.  The postoperative course was complicated by acute kidney injury in the setting of CKD with improvement in kidney function after IV hydration.  Lasix IV was given due to CHF history to avoid volume overload.  She was discharged to home on postoperative day 3 tolerating a regular diet, ambulating, pain controlled, passing flatus, voiding.  CT head reviewed and hip/knee/femur xrays without evidence of fracture or injury after fall on evening of POD 2.  She is to follow up with cardiology next week for hospital follow up and to see when to resume her metoprolol.  Consults: None  Significant Diagnostic Studies: CT head, DG hip/femur/knee after fall on POD 2  Treatments: surgery: see above, IV hydration  Discharge Exam: Blood pressure (!) 155/77, pulse 67, temperature 98.5 F (36.9 C), temperature source Oral, resp. rate 18, height 5\' 4"  (1.626 m), weight 237 lb 7 oz (107.7 kg), SpO2 100 %. General appearance: alert, cooperative and no distress Resp: clear to auscultation bilaterally and slightly diminished in the bases but improved with deep inspiration, crackles resolved Cardio: bradycardic, regular rhythm GI: soft, non-tender; bowel sounds normal; no masses,  no organomegaly Extremities: extremities normal, atraumatic, no cyanosis or edema Incision/Wound: lap sites to the abdomen with dermabond without drainage Right lateral  hip slightly edematous, no ecchymosis noted on the right LE, xrays reviewed  Disposition: Discharge disposition: 01-Home or Self Care       Discharge Instructions    Call MD for:  difficulty breathing, headache or visual disturbances   Complete by:  As directed    Call MD for:  extreme fatigue   Complete by:  As directed    Call MD for:  hives   Complete by:  As directed    Call MD for:  persistant dizziness or light-headedness   Complete by:  As directed    Call MD for:  persistant nausea and vomiting   Complete by:  As directed    Call MD for:  redness, tenderness, or signs of infection (pain, swelling, redness, odor or green/yellow discharge around incision site)   Complete by:  As directed    Call MD for:  severe uncontrolled pain   Complete by:  As directed    Call MD for:  temperature >100.4   Complete by:  As directed    Diet - low sodium heart healthy   Complete by:  As directed    Driving Restrictions   Complete by:  As directed    No driving for 1 week if cleared to drive before surgery.  Do not take narcotics and drive.   Increase activity slowly   Complete by:  As directed    Lifting restrictions   Complete by:  As directed    No lifting greater than 10 lbs.   Sexual Activity Restrictions   Complete by:  As directed    No sexual activity, nothing in the vagina, for 8 weeks.     Allergies as of 11/17/2017      Reactions  Vicodin [hydrocodone-acetaminophen] Itching      Medication List    STOP taking these medications   azithromycin 250 MG tablet Commonly known as:  ZITHROMAX   ibuprofen 200 MG tablet Commonly known as:  ADVIL,MOTRIN   megestrol 40 MG tablet Commonly known as:  MEGACE   metoprolol tartrate 25 MG tablet Commonly known as:  LOPRESSOR     TAKE these medications   amLODipine 10 MG tablet Commonly known as:  NORVASC Take 1 tablet (10 mg total) by mouth daily.   furosemide 40 MG tablet Commonly known as:  LASIX Take 1 tablet  (40 mg total) by mouth daily.   hydrALAZINE 10 MG tablet Commonly known as:  APRESOLINE Take 1 tablet (10 mg total) by mouth 3 (three) times daily.   HYDROmorphone 2 MG tablet Commonly known as:  DILAUDID Take 1 tablet (2 mg total) by mouth every 4 (four) hours as needed for severe pain.      Follow-up Information    Everitt Amber, MD Follow up on 12/08/2017.   Specialty:  Obstetrics and Gynecology Why:  at 3:45pm at the [facility] information: [place] [address] [phone]        With Cardiology in one week Follow up.   Why:  to see when to resume your metoprolol          Greater than thirty minutes were spend for face to face discharge instructions and discharge orders/summary in EPIC.   Signed: Dorothyann Gibbs 11/17/2017, 11:34 AM

## 2017-11-17 NOTE — Discharge Instructions (Signed)
11/14/2017  Return to work: 4 weeks  Activity: 1. Be up and out of the bed during the day.  Take a nap if needed.  You may walk up steps but be careful and use the hand rail.  Stair climbing will tire you more than you think, you may need to stop part way and rest.   2. No lifting or straining for 6 weeks.  3. No driving for 1 week.  Do Not drive if you are taking narcotic pain medicine.  4. Shower daily.  Use soap and water on your incision and pat dry; don't rub.   5. No sexual activity and nothing in the vagina for 8 weeks.  6. Follow up with your cardiologist in one week to determine when you need to restart your metoprolol.  7. DO NOT USE PAIN MEDICATION WITH COCAINE. THIS CAN BE DEADLY.  Medications:  - Take tylenol first line for pain control. Take these regularly (every 6 hours) to decrease the build up of pain.  - If necessary, for severe pain not relieved by tylenol, take hydromorphone.  - While taking hydromorphone you should take sennakot every night to reduce the likelihood of constipation. If this causes diarrhea, stop its use.  Diet: 1. Low sodium Heart Healthy Diet is recommended.  2. It is safe to use a laxative if you have difficulty moving your bowels.   Wound Care: 1. Keep clean and dry.  Shower daily.  Reasons to call the Doctor:   Fever - Oral temperature greater than 100.4 degrees Fahrenheit  Foul-smelling vaginal discharge  Difficulty urinating  Nausea and vomiting  Increased pain at the site of the incision that is unrelieved with pain medicine.  Difficulty breathing with or without chest pain  New calf pain especially if only on one side  Sudden, continuing increased vaginal bleeding with or without clots.   Follow-up: 1. See Everitt Amber in 3 weeks.  Contacts: For questions or concerns you should contact:  Dr. Everitt Amber at [phone] After hours and on week-ends call [phone] and ask to speak to the physician on call for  Gynecologic Oncology   Opioid Pain Medicine Information Opioids are powerful medicines that are used to treat moderate to severe pain. Opioids should be taken with the supervision of a trained health care provider. They should be taken for the shortest period of time as possible. This is because opioids can be addictive and the longer you take opioids, the greater your risk of addiction (opioid use disorder). What do opioids do? Opioids help to reduce or eliminate pain. When used for short periods of time, they can help you:  Sleep better.  Do better in physical or occupational therapy.  Feel better in the first few days after an injury.  Recover from surgery.  What instructions should I follow while taking opioid pain medicine? While you are taking the medicine and for 8 hours after you stop taking the medicine, follow these instructions:  Do not drive.  Do not use machinery or power tools.  Do not sign legal documents.  Do not drink alcohol.  Do not take sleeping pills.  Do not supervise children by yourself.  Do not participate in activities that require climbing or being in high places.  Do not enter a body of water--such as a lake, river, ocean, spa, or swimming pool--unless an adult is nearby who can monitor and help you.  What kinds of side effects can opioids cause? Opioids can cause  side effects, such as:  Constipation.  Nausea.  Vomiting.  Drowsiness.  Confusion.  Opioid use disorder.  Breathing difficulties (respiratory depression).  Using opioid pain medicines for longer than 3 days increases your risk of these side effects. Taking opioid pain medicine for a long period of time can affect your ability to do daily tasks. It also puts you at risk for:  Motor vehicle accidents.  Depression.  Suicide.  Heart attack.  Overdose, which can sometimes lead to death.  What are alternative ways to manage pain? Pain can be managed with many types of  alternative treatments. Ask your health care provider to refer you to one or more specialists who can help you manage pain through:  Physical or occupational therapy.  Counseling (cognitive behavioral therapy).  Good nutrition.  Biofeedback.  Massage.  Meditation.  Non-opioid medicine.  Following a gentle exercise program.  How can I keep others safe while I am taking opioid pain medicine?  Keep pain medicine in a locked cabinet, or in a secure area where children cannot reach it.  Never share your pain medicine with anyone.  Do not save any leftover pills. If you have leftover medicine, you can: 1. Bring the medicine to a prescription take-back program. This is usually offered by the county or Event organiser. 2. Throw it out in the trash. To do this:  Mix the medicine with undesirable trash such as pet waste or food.  Put the mixture in a sealed container or plastic bag.  Throw it in the trash.  Destroy any personal information on the prescription bottle. How do I stop taking opioids if I have been taking them for a long time? If you have been taking opioid medicine for more than a few weeks, you may need to slowly decrease (taper) how much you take until you stop completely. Tapering your use of opioids can decrease your chances of experiencing withdrawal symptoms, such as:  Pain and cramping in the abdomen.  Nausea.  Sweating.  Sleepiness.  Restlessness.  Uncontrollable shaking (tremors).  Cravings for the medicine.  Where to find support: If you have been taking opioids for a long time, you may benefit from receiving support for quitting from a local support group or counselor. Ask your health care provider for a referral to these resources in your area. Where to find more information:  Centers for Disease Control and Prevention (CDC): GenerationShow.ch Get help right away if: Seek medical care right away if you are taking  opioids and you (or people close to you) notice any of the following:  Difficulty breathing.  Breathing that is slower or more shallow than normal.  A very slow heartbeat (pulse).  Severe confusion.  Unconsciousness.  Sleepiness.  Slurred speech.  Nausea and vomiting.  Cold, clammy skin.  Blue lips or fingernails.  Limpness.  Abnormally small pupils.  If you think that you or someone else may have taken too much of an opioid medicine, get medical help right away. Do not wait to see if the symptoms go away on their own.  If you ever feel like you may hurt yourself or others, or have thoughts about taking your own life, get help right away. You can go to your nearest emergency department or call:  Your local emergency services (911 in the [place]  The hotline of the [facility] 260 462 2486 in the [place]  A suicide crisis helpline, such as the [place] at (701) 337-5108. This is open 24  hours a day.  Summary  Opioid medicines can help you manage moderate to severe pain for a short period of time.  Discuss the goals of your treatment with your health care provider, including how much pain you might expect to have and how you will manage the pain.  A good treatment plan uses more than one approach. Pain can be managed with many types of alternative treatments.  If you think that you or someone else may have taken too much of an opioid, get medical help right away. This information is not intended to replace advice given to you by your health care provider. Make sure you discuss any questions you have with your health care provider. Document Released: 07/03/2015 Document Revised: 09/23/2016 Document Reviewed: 01/16/2015 Elsevier Interactive Patient Education  2018 [employer].   Stimulant Use Disorder-Cocaine  Cocaine belongs to a group of powerful drugs known as stimulants. Common street names for cocaine include coke,  crack, blow, snow, C, powder, and nose candy. Cocaine has some medical uses, but it is often misused because of the effects that it produces. These effects include:  A feeling of extreme pleasure (euphoria).  Alertness.  A high energy level.  Stimulant use disorder is when your stimulant use disrupts your daily life. It may disrupt your relationships and how you do your job. Stimulant use disorder can be dangerous. Cocaine increases your blood pressure and heart rate. Using it can lead to a heart attack or stroke. Cocaine can also make your heart rate irregular and cause seizures. These problems can lead to death. What are the causes? This condition is caused by misusing cocaine. Many people start using cocaine because it makes them feel good. Over time, they get addicted to it. When they try to stop using it, they feel sick. What increases the risk? This condition is more likely to develop in:  People who misuse other drugs.  People with a family history of misusing drugs.  What are the signs or symptoms? Symptoms of this condition include:  Using greater amounts of cocaine than you want to, or using cocaine for longer than you want to.  Trying several times to use less cocaine or to control your cocaine use.  Craving cocaine.  Spending a lot of time getting cocaine, using it, or recovering from its effects.  Having problems at work, at school, at home, or with relationships because of cocaine use.  Giving up or cutting down on important life activities because of cocaine use.  Using cocaine when it is dangerous, such as when driving a car.  Continuing to use cocaine even though it is causing or has led to a physical problem, such as: ? Malnutrition. ? Nosebleeds. ? Chest pain. ? High blood pressure. ? A hole between the part of your nose that separates your nostrils (perforated nasal septum). ? Lung and kidney damage.  Continuing to use cocaine even though it is causing a  mental problem, such as: ? Schizophrenia-like symptoms. ? Depression. ? Bipolar mood swings. ? Anxiety. ? Sleep problems.  Needing more and more cocaine to get the same effect that you want (building up a tolerance).  Having symptoms of withdrawal when you stop using cocaine. Symptoms of withdrawal include: ? Depression. ? Irritability. ? Low energy. ? Restlessness. ? Bad dreams. ? Too little or too much sleep. ? Increased appetite.  How is this diagnosed? This condition is diagnosed with an assessment. During the assessment, your health care provider will ask about your  cocaine use and about how it affects your life. Your health care provider may also:  Perform a physical exam or do lab tests to see if you have physical problems resulting from cocaine use.  Screen for drug use.  Refer you to a mental health professional for evaluation.  How is this treated? Treatment for this condition is usually provided by mental health professionals with training in substance use disorders. Treatment may involve:  Counseling. This treatment is also called talk therapy. It is provided by substance use treatment counselors. A counselor can address the reasons you use cocaine and suggest ways to keep you from using it again. The goals of talk therapy are to: ? Find healthy activities to replace using cocaine. ? Identify and avoid what triggers your cocaine use. ? Help you learn how to handle cravings.  Support groups. Support groups are run by people who have quit using stimulants. They provide emotional support, advice, and guidance.  Medicines.  Follow these instructions at home:  Take over-the-counter and prescription medicines only as told by your health care provider.  Check with your health care provider before starting any new medicines.  Do not use any products that contain nicotine or tobacco, such as cigarettes and e-cigarettes. If you need help quitting, ask your health care  provider.  Keep all follow-up visits as told by your health care provider. This is important. Where to find more information:  [employer] on Drug Abuse: motorcyclefax.com  Substance Abuse and Mental Health Services Administration: ktimeonline.com Contact a health care provider if:  You are not able to take your medicines as told.  You use cocaine again.  Your symptoms get worse. Get help right away if:  You have serious thoughts about hurting yourself or others.  You have a seizure.  You have chest pain.  You have sudden weakness.  You lose some of your vision.  You lose some of your speech. If you ever feel like you may hurt yourself or others, or have thoughts about taking your own life, get help right away. You can go to your nearest emergency department or call:  Your local emergency services (911 in the [place]  A suicide crisis helpline, such as the [place] at 803-087-4921. This is open 24 hours a day.  This information is not intended to replace advice given to you by your health care provider. Make sure you discuss any questions you have with your health care provider. Document Released: 06/03/2000 Document Revised: 03/18/2016 Document Reviewed: 03/18/2016 Elsevier Interactive Patient Education  [employer].

## 2017-11-20 ENCOUNTER — Encounter: Payer: Self-pay | Admitting: Cardiology

## 2017-11-20 ENCOUNTER — Ambulatory Visit (INDEPENDENT_AMBULATORY_CARE_PROVIDER_SITE_OTHER): Payer: PRIVATE HEALTH INSURANCE | Admitting: Cardiology

## 2017-11-20 VITALS — BP 142/82 | HR 80 | Ht 68.0 in | Wt 231.0 lb

## 2017-11-20 DIAGNOSIS — R0789 Other chest pain: Secondary | ICD-10-CM | POA: Diagnosis not present

## 2017-11-20 DIAGNOSIS — C541 Malignant neoplasm of endometrium: Secondary | ICD-10-CM | POA: Diagnosis not present

## 2017-11-20 DIAGNOSIS — I5032 Chronic diastolic (congestive) heart failure: Secondary | ICD-10-CM

## 2017-11-20 NOTE — Patient Instructions (Signed)
Medication Instructions:  Your physician recommends that you continue on your current medications as directed. Please refer to the Current Medication list given to you today.   Labwork: None  Testing/Procedures: None  Follow-Up: Your physician recommends that you schedule a follow-up appointment in: 2 months.  If you need a refill on your cardiac medications before your next appointment, please call your pharmacy.   Thank you for choosing [facility]! Miki Blank, RN [phone]    

## 2017-11-20 NOTE — Progress Notes (Signed)
Cardiology Office Note:    Date:  11/20/2017   ID:  [name], DOB 03/17/1961, MRN [id number]  PCP:  Charlott Rakes, MD  Cardiologist:  Jenne Campus, MD    Referring MD: Charlott Rakes, MD   Chief Complaint  Patient presents with  . Follow-up  Recovering after surgery  History of Present Illness:    Angela Gardner is a 57 y.o. female after laparoscopic hysterectomy.  Doing well but complaining of weak tired and fatigue.  Apparently she had to stay 4 days in the hospital because of low blood pressure.  Denies have any chest pain tightness squeezing pressure burning chest.  Before surgery she had a popping stress test which was negative.  Past Medical History:  Diagnosis Date  . Anemia   . 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 CHF (congestive heart failure) ([place])   . Dyspnea    still having this and not moving around alot -gets worse with exertion  . History of sleep walking   . Hx of left breast biopsy   . Hyperlipemia   . Hypertension    not on any medication now-[facility] had  prescribed her a med and she stopped taking them cannot remember when    Past Surgical History:  Procedure Laterality Date  . BREAST BIOPSY     left breast  . CESAREAN SECTION     one  . CHOLECYSTECTOMY    . DILATION AND CURETTAGE OF UTERUS     approx 1 year ago  . LYMPH NODE BIOPSY N/A 11/14/2017   Procedure: SENTINEL LYMPH NODE BIOPSY;  Surgeon: Everitt Amber, MD;  Location: [facility];  Service: Gynecology;  Laterality: N/A;  . ROBOTIC ASSISTED TOTAL HYSTERECTOMY WITH BILATERAL SALPINGO OOPHERECTOMY N/A 11/14/2017   Procedure: XI ROBOTIC ASSISTED TOTAL HYSTERECTOMY WITH BILATERAL SALPINGO OOPHORECTOMY, RIGHT PELVIC LYMPHADENECTOMY;  Surgeon: Everitt Amber, MD;  Location: [facility];  Service: Gynecology;  Laterality: N/A;    Current Medications: Current Meds  Medication Sig  . amLODipine (NORVASC) 10 MG tablet Take 1 tablet (10 mg total) by mouth daily.  . furosemide (LASIX) 40 MG  tablet Take 1 tablet (40 mg total) by mouth daily.  . hydrALAZINE (APRESOLINE) 10 MG tablet Take 1 tablet (10 mg total) by mouth 3 (three) times daily.  Marland Kitchen HYDROmorphone (DILAUDID) 2 MG tablet Take 1 tablet (2 mg total) by mouth every 4 (four) hours as needed for severe pain.     Allergies:   Vicodin [hydrocodone-acetaminophen]   Social History   Socioeconomic History  . Marital status: Single    Spouse name: Not on file  . Number of children: Not on file  . Years of education: Not on file  . Highest education level: Not on file  Occupational History  . Not on file  Social Needs  . Financial resource strain: Not on file  . Food insecurity:    Worry: Not on file    Inability: Not on file  . Transportation needs:    Medical: Not on file    Non-medical: Not on file  Tobacco Use  . Smoking status: Former Smoker    Packs/day: 0.30    Years: 26.00    Pack years: 7.80    Types: Cigarettes  . Smokeless tobacco: Never Used  . Tobacco comment: quit 1 week ago  Substance and Sexual Activity  . Alcohol use: Yes    Comment: social drinking  . Drug use: Yes    Types: Cocaine  Comment: last time was one  to two weeks ago per patient   . Sexual activity: Not Currently    Birth control/protection: Post-menopausal  Lifestyle  . Physical activity:    Days per week: Not on file    Minutes per session: Not on file  . Stress: Not on file  Relationships  . Social connections:    Talks on phone: Not on file    Gets together: Not on file    Attends religious service: Not on file    Active member of club or organization: Not on file    Attends meetings of clubs or organizations: Not on file    Relationship status: Not on file  Other Topics Concern  . Not on file  Social History Narrative  . Not on file     Family History: The patient's family history includes Anuerysm in her mother; Congestive Heart Failure in her mother; Hypertension in her brother, father, and sister. ROS:     Please see the history of present illness.    All 14 point review of systems negative except as described per history of present illness  EKGs/Labs/Other Studies Reviewed:      Recent Labs: 09/12/2017: B Natriuretic Peptide 258.2 10/26/2017: ALT 14 11/17/2017: BUN 23; Creatinine, Ser 1.59; Hemoglobin 10.8; Platelets 261; Potassium 3.7; Sodium 139  Recent Lipid Panel    Component Value Date/Time   CHOL 228 (H) 03/30/2009 2039   TRIG 139 03/30/2009 2039   HDL 67 03/30/2009 2039   CHOLHDL 3.4 Ratio 03/30/2009 2039   VLDL 28 03/30/2009 2039   LDLCALC 133 (H) 03/30/2009 2039    Physical Exam:    VS:  BP (!) 142/82   Pulse 80   Ht 5\' 8"  (1.727 m)   Wt 231 lb (104.8 kg)   LMP  (LMP Unknown) Comment: vaginal bleeding continuously for last 8 months  SpO2 97%   BMI 35.12 kg/m     Wt Readings from Last 3 Encounters:  11/20/17 231 lb (104.8 kg)  11/17/17 237 lb 7 oz (107.7 kg)  11/09/17 225 lb (102.1 kg)     GEN:  Well nourished, well developed in no acute distress HEENT: Normal NECK: No JVD; No carotid bruits LYMPHATICS: No lymphadenopathy CARDIAC: RRR, no murmurs, no rubs, no gallops RESPIRATORY:  Clear to auscultation without rales, wheezing or rhonchi  ABDOMEN: Soft, non-tender, non-distended MUSCULOSKELETAL:  No edema; No deformity  SKIN: Warm and dry LOWER EXTREMITIES: no swelling NEUROLOGIC:  Alert and oriented x 3 PSYCHIATRIC:  Normal affect   ASSESSMENT:    1. Chronic diastolic congestive heart failure, NYHA class 3 (HCC)   2. Endometrial cancer (HCC)   3. Chest discomfort   4. Morbid obesity ([place])    PLAN:    In order of problems listed above:  1. Chronic diastolic congestive heart failure: Appears to be compensated continue present management. 2. Endometrial cancer: Status post surgery.  Doing well. 3. Chest discomfort denies having any 4. Morbid obesity obviously a problem will be beneficial for her to lose weight. 5. Essential hypertension: Controlled  continue present management.   Medication Adjustments/Labs and Tests Ordered: Current medicines are reviewed at length with the patient today.  Concerns regarding medicines are outlined above.  No orders of the defined types were placed in this encounter.  Medication changes: No orders of the defined types were placed in this encounter.   Signed, Park Liter, MD, [facility] 11/20/2017 11:32 AM    [place]

## 2017-11-21 ENCOUNTER — Telehealth: Payer: Self-pay | Admitting: *Deleted

## 2017-11-21 ENCOUNTER — Telehealth: Payer: Self-pay | Admitting: Gynecologic Oncology

## 2017-11-21 NOTE — Telephone Encounter (Signed)
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 Plan is for chemotherapy 6 cycles carboplatin and paclitaxel and vaginal brachytherapy for high risk features. All questions answered.  Thereasa Solo, MD

## 2017-11-21 NOTE — Telephone Encounter (Signed)
Patient called to find our results from her surgical pathology, per Dr. Denman George I told the patient that we would call her with the results. Patient verbalized understanding.

## 2017-11-22 ENCOUNTER — Telehealth: Payer: Self-pay | Admitting: Oncology

## 2017-11-22 ENCOUNTER — Encounter: Payer: Self-pay | Admitting: Radiation Oncology

## 2017-11-22 ENCOUNTER — Encounter: Payer: Self-pay | Admitting: Oncology

## 2017-11-22 ENCOUNTER — Telehealth: Payer: Self-pay | Admitting: Hematology and Oncology

## 2017-11-22 DIAGNOSIS — C541 Malignant neoplasm of endometrium: Secondary | ICD-10-CM

## 2017-11-22 NOTE — Telephone Encounter (Signed)
Patient scheduled per 6/5 sch message. Patient aware of date and time

## 2017-11-22 NOTE — Telephone Encounter (Signed)
Thanks for coordinating appt

## 2017-11-22 NOTE — Telephone Encounter (Signed)
Angela Gardner and notified her of appointments with Dr. Alvy Bimler and Dr. Sondra Come on 11/27/17.  Advised her to arrive at 10:45 to register.

## 2017-11-23 ENCOUNTER — Ambulatory Visit: Payer: PRIVATE HEALTH INSURANCE | Admitting: Gynecologic Oncology

## 2017-11-24 ENCOUNTER — Other Ambulatory Visit (HOSPITAL_COMMUNITY)
Admission: RE | Admit: 2017-11-24 | Discharge: 2017-11-24 | Disposition: A | Discharge status: Home / Self Care | Payer: PRIVATE HEALTH INSURANCE | Source: Ambulatory Visit | Attending: Hematology and Oncology | Admitting: Hematology and Oncology

## 2017-11-24 DIAGNOSIS — C541 Malignant neoplasm of endometrium: Secondary | ICD-10-CM | POA: Insufficient documentation

## 2017-11-27 ENCOUNTER — Inpatient Hospital Stay: Payer: PRIVATE HEALTH INSURANCE | Admitting: Hematology and Oncology

## 2017-11-27 ENCOUNTER — Other Ambulatory Visit: Payer: Self-pay

## 2017-11-27 ENCOUNTER — Ambulatory Visit
Admission: RE | Admit: 2017-11-27 | Discharge: 2017-11-27 | Disposition: A | Discharge status: Home / Self Care | Payer: PRIVATE HEALTH INSURANCE | Source: Ambulatory Visit | Attending: Radiation Oncology | Admitting: Radiation Oncology

## 2017-11-27 ENCOUNTER — Telehealth: Payer: Self-pay | Admitting: Oncology

## 2017-11-27 ENCOUNTER — Encounter: Payer: Self-pay | Admitting: Radiation Oncology

## 2017-11-27 VITALS — BP 168/91 | HR 63 | Temp 98.4°F | Resp 18 | Wt 233.0 lb

## 2017-11-27 DIAGNOSIS — C541 Malignant neoplasm of endometrium: Secondary | ICD-10-CM

## 2017-11-27 NOTE — Progress Notes (Signed)
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  She stopped vaginal bleeding until approximately November 2018 when she began having heavy menstrual-like bleeding and was started on Megace.  This helped some however bleeding persisted and therefore she underwent a transvaginal ultrasound scan on 09/15/2017 this revealed a uterus measuring 11.2 x 5 x 6.9 cm with a subserosal fundal fibroid measuring up to 3.8 cm.  The endometrial thickness was 7 mm with concern for an echogenic mass centrally within the endometrium measuring 3.8 x 3.8 x 3.6 cm.  She then underwent an endometrial Pipelle biopsy on 10/09/2017 which revealed high-grade adenocarcinoma  Biopsies revealed:  10/09/2017 Diagnosis Endometrium, biopsy - HIGH GRADE ADENOCARCINOMA. - SEE MICROSCOPIC DESCRIPTION. There is abundant secretory type endometrium and scattered fragments of high grade adenocarcinoma, which focally has features suggestive of high grade serous carcinoma. There is also a microscopic focus with high grade carcinoma and associated spindled cell component. In this small focus a sarcomatous component cannot be ruled out; however, carcinoma with sarcomatoid features is favored  11/14/2017 Diagnosis 1. Lymph node, sentinel, biopsy, left external iliac - ONE BENIGN LYMPH NODE (0/1). 2. Lymph nodes, regional resection, right pelvic - FIVE BENIGN LYMPH NODES (0/5). 3. 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 - CARCINOMA INVADES 0.5 CM IN A 3 CM MYOMETRIUM. - CERVIX, BILATERAL OVARIES AND BILATERAL FALLOPIAN TUBES FREE OF TUMOR. CERVIX: - NABOTHIAN CYSTS. - NO DYSPLASIA OR MALIGNANCY. MYOMETRIUM: -  ADENOMYOSIS. - LEIOMYOMATA. RIGHT AND LEFT OVARIES: - UNREMARKABLE. - NO ENDOMETRIOSIS OR MALIGNANCY. RIGHT FALLOPIAN TUBE: - PREVIOUS TUBAL LIGATION. - NO ENDOMETRIOSIS OR MALIGNANCY. LEFT FALLOPIAN TUBE: - BENIGN ADENOMATOID TUMOR, 0.8 CM. - PREVIOUS TUBAL LIGATION. - NO ENDOMETRIOSIS OR MALIGNANCY.  Past/Anticipated interventions by Gyn/Onc surgery, if any: 11/14/2017 Surgeon Dr. Everitt Amber -Robotic-assisted laparoscopic total hysterectomy with bilateral salpingoophorectomy, SLN biopsy, right pelvic lymphadenectomy  Past/Anticipated interventions by medical oncology, if any:  Consult with Dr. Heath Lark 11/27/2017  Weight changes, if any: No  Bowel/Bladder complaints, if any: constipation causing distress and pain. Tried multiple OTC medications to relieve without success.   Nausea/Vomiting, if any: No  Pain issues, if any:  10/10, all of abdomen R/T constipation.   SAFETY ISSUES:  Prior radiation? No  Pacemaker/ICD? No  Possible current pregnancy? No  Is the patient on methotrexate? No  Current Complaints / other details:  Pt c/o constipation.   Pt is here today with sister for initial consult for Radiation with Dr. Sondra Come. Pt is obvious distress related to constipation.

## 2017-11-27 NOTE — Telephone Encounter (Signed)
Called patient and she said she forgot about the appointment with Dr. Alvy Bimler today. Asked if she can reschedule for tomorrow at 11:00 (arrival at 10:30 to check in).  She said that time would work.  Message sent to scheduling.

## 2017-11-27 NOTE — Progress Notes (Signed)
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 female who is accompanied by sister, whom she also lives with. The patient has not been working since February 2018. 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   In 2017 she experienced postmenopausal bleeding that led her to having a D&C procedure that resulted benign. She did not experience menstrual bleeding again until November 2018, when she was started on Megace. The Megace mildly helped, but did not stop her bleeding.   On 09/15/2017 she underwent a transvaginal ultrasound that showed her uterus measured 11.2 x 5 x 6.9 cm with a subserosal fundal fibroid measuring up to 3.8 cm. The endometrial thickness was 7 mm with concern for an echogenic mass centrally within the endometrium measuring 3.8 x 3.8 x 3.6 cm.   On 10/09/2017 she underwent an endometrial Pipelle biopsy which revealed high-grade adenocarcinoma.   On 11/14/2017 she underwent a  Robotic Assisted Total Hysterectomy with Bilateral Salpingo Oophorectomy, Right Pelvic Lymphadenectomy and Sentinel Lymph Node Biopsy that was performed by Dr. Everitt Amber. Pathology from this surgery revealed high-grade endometrial carcinoma with sarcomatoid features measuring 2.3 cm in size area the carcinoma invaded to a depth of 0.5 cm at a 3 cm myometrial thickness right pelvic node dissection revealed 5 benign lymph nodes and left sentinel  lymph node biopsy revealed one benign lymph node from the left external iliac nodal region  On review of systems, she endorses abdominal bloating/ constipation. She also adds swelling to her right knee  and lower leg and endorses pain to these areas that started after falling status post her surgery. She is not on any blood thinners. She denies fever, chills or any other symptoms or complaints at this time.   PREVIOUS RADIATION THERAPY: No  PAST MEDICAL HISTORY:  has a past medical history of Anemia, Cancer ([place]), CHF (congestive heart failure) ([place]), Dyspnea, History of sleep walking, left breast biopsy, Hyperlipemia, and Hypertension.    PAST SURGICAL HISTORY: Past Surgical History:  Procedure Laterality Date  . BREAST BIOPSY     left breast  . CESAREAN SECTION     one  . CHOLECYSTECTOMY    . DILATION AND CURETTAGE OF UTERUS     approx 1 year ago  . LYMPH NODE BIOPSY N/A 11/14/2017   Procedure: SENTINEL LYMPH NODE BIOPSY;  Surgeon: Everitt Amber, MD;  Location: [facility];  Service: Gynecology;  Laterality: N/A;  . ROBOTIC ASSISTED TOTAL HYSTERECTOMY WITH BILATERAL SALPINGO OOPHERECTOMY N/A 11/14/2017   Procedure: XI ROBOTIC ASSISTED TOTAL HYSTERECTOMY WITH BILATERAL SALPINGO OOPHORECTOMY, RIGHT PELVIC LYMPHADENECTOMY;  Surgeon: Everitt Amber, MD;  Location: [facility];  Service: Gynecology;  Laterality: N/A;    FAMILY HISTORY: family history includes Anuerysm in her mother; Congestive Heart Failure in her mother; Hypertension in her brother, father, and sister.  SOCIAL HISTORY:  reports that she has quit smoking. Her smoking use included cigarettes. She has a 7.80 pack-year smoking history. She has never used smokeless tobacco. She reports that she drinks alcohol. She reports that she has current or past drug history. Drug: Cocaine.  ALLERGIES: Vicodin [hydrocodone-acetaminophen]  MEDICATIONS:  Current Outpatient Medications  Medication Sig Dispense  Refill  . amLODipine (NORVASC) 10 MG tablet Take 1 tablet (10 mg total) by mouth daily. 90 tablet 3  . furosemide (LASIX) 40 MG tablet Take 1 tablet (40 mg total) by mouth daily. 30 tablet 5  . hydrALAZINE (APRESOLINE) 10 MG tablet Take 1 tablet  (10 mg total) by mouth 3 (three) times daily. 270 tablet 3  . metoprolol tartrate (LOPRESSOR) 25 MG tablet Take 25 mg by mouth 2 (two) times daily.    . polyethylene glycol (MIRALAX / GLYCOLAX) packet Take 17 g by mouth daily.    Marland Kitchen senna (SENOKOT) 8.6 MG TABS tablet Take 1 tablet by mouth daily.    Marland Kitchen HYDROmorphone (DILAUDID) 2 MG tablet Take 1 tablet (2 mg total) by mouth every 4 (four) hours as needed for severe pain. (Patient not taking: Reported on 11/27/2017) 20 tablet 0   No current facility-administered medications for this encounter.     REVIEW OF SYSTEMS:  A 10+ POINT REVIEW OF SYSTEMS WAS OBTAINED including neurology, dermatology, psychiatry, cardiac, respiratory, lymph, extremities, GI, GU, musculoskeletal, constitutional, reproductive, HEENT. All pertinent positives are noted in the HPI. All others are negative.   PHYSICAL EXAM:  weight is 233 lb (105.7 kg). Her temperature is 98.4 F (36.9 C). Her blood pressure is 168/91 (abnormal) and her pulse is 63. Her respiration is 18 and oxygen saturation is 100%.    General: Alert and oriented, in no acute distress HEENT: Head is normocephalic. Extraocular movements are intact. Oropharynx is clear. Neck: Neck is supple, no palpable cervical or supraclavicular lymphadenopathy. Heart: Regular in rate and rhythm with no murmurs, rubs, or gallops. Chest: Clear to auscultation bilaterally, with no rhonchi, wheezes, or rales. Abdomen: Soft, nontender, nondistended, with no rigidity or guarding. The patient's laproscopic scars are healing well without drainage or infection.  Extremities: No cyanosis or edema. Lymphatics: see Neck Exam Skin: No concerning lesions. Musculoskeletal: symmetric strength and muscle tone throughout. Neurologic: Cranial nerves II through XII are grossly intact. No obvious focalities. Speech is fluent. Coordination is intact. Psychiatric: Judgment and insight are intact. Affect is appropriate. Pelvic exam DEFERRED in  light of recent surgery.   ECOG = 1  LABORATORY DATA:  Lab Results  Component Value Date   WBC 5.2 11/17/2017   HGB 10.8 (L) 11/17/2017   HCT 33.5 (L) 11/17/2017   MCV 89.6 11/17/2017   PLT 261 11/17/2017   NEUTROABS 3.6 11/16/2017   Lab Results  Component Value Date   NA 139 11/17/2017   K 3.7 11/17/2017   CL 110 11/17/2017   CO2 23 11/17/2017   GLUCOSE 122 (H) 11/17/2017   CREATININE 1.59 (H) 11/17/2017   CALCIUM 8.7 (L) 11/17/2017      RADIOGRAPHY: Dg Chest 2 View  Result Date: 11/16/2017 CLINICAL DATA:  Chest crackles. Nasal congestion. History of chronic kidney disease, diastolic CHF, and newly diagnosed endometrial cancer. EXAM: CHEST - 2 VIEW COMPARISON:  11/06/2017 FINDINGS: The cardiac silhouette is mildly to moderately enlarged, accentuated by AP technique on today's examination. There is mild elevation of the right hemidiaphragm. There is mild pulmonary vascular congestion without overt edema. Mild streaky infrahilar opacities bilaterally likely represent atelectasis. No sizable pleural effusion or pneumothorax is identified. No acute osseous abnormality is seen. IMPRESSION: Cardiomegaly with mild pulmonary vascular congestion. Electronically Signed   By: Logan Bores M.D.   On: 11/16/2017 12:15   Dg Chest 2 View  Result Date: 11/06/2017 CLINICAL DATA:  Coughing and wheezing for 2 days.  Smoker. EXAM: CHEST -  2 VIEW COMPARISON:  09/12/2017 FINDINGS: Mild pectus excavatum deformity. Midline trachea. Mild cardiomegaly. No pleural effusion or pneumothorax. Clear lungs. IMPRESSION: Cardiomegaly, without acute disease. Electronically Signed   By: Abigail Miyamoto M.D.   On: 11/06/2017 16:50   Dg Knee 1-2 Views Right  Result Date: 11/17/2017 CLINICAL DATA:  Pain following fall EXAM: RIGHT KNEE - 1-2 VIEW COMPARISON:  None. FINDINGS: Frontal and lateral views were obtained. No fracture or dislocation. No joint effusion. There is moderate narrowing of the patellofemoral joint.  There is spurring in all compartments. No erosive change. 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 No evident fracture or joint effusion. Electronically Signed   By: Lowella Grip III M.D.   On: 11/17/2017 10:32   Ct Head Wo Contrast  Result Date: 11/16/2017 CLINICAL DATA:  Headache.  Status post fall. EXAM: CT HEAD WITHOUT CONTRAST TECHNIQUE: Contiguous axial images were obtained from the base of the skull through the vertex without intravenous contrast. COMPARISON:  May 21, 2015 FINDINGS: Brain: No evidence of acute infarction, hemorrhage, hydrocephalus, extra-axial collection or mass lesion/mass effect. Vascular: No hyperdense vessel or unexpected calcification. Skull: Normal. Negative for fracture or focal lesion. Sinuses/Orbits: Mild debris in the sphenoid sinuses. Paranasal sinuses, mastoid air cells, and middle ears are otherwise normal. Other: None. IMPRESSION: No acute intracranial abnormalities to explain the patient's symptoms. Electronically Signed   By: Dorise Bullion III M.D   On: 11/16/2017 20:56   Dg Hip Unilat With Pelvis 2-3 Views Right  Result Date: 11/17/2017 CLINICAL DATA:  Pain following fall EXAM: DG HIP (WITH OR WITHOUT PELVIS) 2-3V RIGHT COMPARISON:  None. FINDINGS: Frontal pelvis as well as frontal and lateral right hip images were obtained. There is no fracture or dislocation. There is slight symmetric narrowing both hip joints. Sacroiliac joints appear normal. No erosive change. IMPRESSION: Slight symmetric narrowing of each hip joint. No fracture or dislocation. Electronically Signed   By: Lowella Grip III M.D.   On: 11/17/2017 10:34   Dg Femur, Min 2 Views Right  Result Date: 11/17/2017 CLINICAL DATA:  Pain following fall EXAM: RIGHT FEMUR 2 VIEWS COMPARISON:  None. FINDINGS: Frontal and lateral views were obtained. There is no evident fracture or dislocation. Osteoarthritic changes noted in the right knee joint and to a lesser  extent in the right hip joint. No erosive change. No knee joint effusion. IMPRESSION: Areas of osteoarthritic change.  No fracture or dislocation. Electronically Signed   By: Lowella Grip III M.D.   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 Serita Grit recommendation for vaginal brachy therapy.  Today, I talked to the patient and sister about the findings and work-up thus far.  We discussed the natural history of endometrial cancer and general treatment, highlighting the role of radiotherapy in the management.  We discussed the available radiation techniques, and focused on the details of logistics and delivery.  We reviewed the anticipated acute and late sequelae associated with radiation in this setting.  The patient was encouraged to ask questions that I answered to the best of my ability.  A patient consent form was discussed and signed.  We retained a copy for our records.  The patient would like to proceed with radiation and will be scheduled for CT simulation.  PLAN: Patient will be scheduled  for vaginal brachytherapy to begin during her 2nd or 3rd cycle of chemotherapy. She will receive 5 weekly intracavitary brachytherapy treatments with iridium 192 as the high-dose-rate source.      ------------------------------------------------  Blair Promise, PhD, MD   This document serves as a record of services personally performed by Blair Promise, PhD, MD. It was created on his behalf by Margit Banda, a trained medical scribe. The creation of this record is based on the scribe's personal observations and the provider's statements to them. This document has been checked and approved by the attending provider.

## 2017-11-28 ENCOUNTER — Encounter: Payer: Self-pay | Admitting: Hematology and Oncology

## 2017-11-28 ENCOUNTER — Other Ambulatory Visit: Payer: Self-pay | Admitting: Hematology and Oncology

## 2017-11-28 ENCOUNTER — Ambulatory Visit (INDEPENDENT_AMBULATORY_CARE_PROVIDER_SITE_OTHER): Payer: PRIVATE HEALTH INSURANCE | Admitting: Physician Assistant

## 2017-11-28 ENCOUNTER — Telehealth: Payer: Self-pay | Admitting: Hematology and Oncology

## 2017-11-28 ENCOUNTER — Encounter: Payer: Self-pay | Admitting: Oncology

## 2017-11-28 ENCOUNTER — Inpatient Hospital Stay: Payer: PRIVATE HEALTH INSURANCE | Attending: Gynecologic Oncology | Admitting: Hematology and Oncology

## 2017-11-28 VITALS — BP 158/96 | HR 73 | Temp 98.4°F | Resp 18 | Ht 68.0 in | Wt 231.4 lb

## 2017-11-28 DIAGNOSIS — C541 Malignant neoplasm of endometrium: Secondary | ICD-10-CM | POA: Diagnosis not present

## 2017-11-28 DIAGNOSIS — I5030 Unspecified diastolic (congestive) heart failure: Secondary | ICD-10-CM | POA: Diagnosis not present

## 2017-11-28 DIAGNOSIS — E785 Hyperlipidemia, unspecified: Secondary | ICD-10-CM | POA: Diagnosis not present

## 2017-11-28 DIAGNOSIS — Z90722 Acquired absence of ovaries, bilateral: Secondary | ICD-10-CM | POA: Diagnosis not present

## 2017-11-28 DIAGNOSIS — K5909 Other constipation: Secondary | ICD-10-CM

## 2017-11-28 DIAGNOSIS — N183 Chronic kidney disease, stage 3 unspecified: Secondary | ICD-10-CM

## 2017-11-28 DIAGNOSIS — Z5111 Encounter for antineoplastic chemotherapy: Secondary | ICD-10-CM | POA: Diagnosis present

## 2017-11-28 DIAGNOSIS — K5900 Constipation, unspecified: Secondary | ICD-10-CM | POA: Diagnosis not present

## 2017-11-28 DIAGNOSIS — I130 Hypertensive heart and chronic kidney disease with heart failure and stage 1 through stage 4 chronic kidney disease, or unspecified chronic kidney disease: Secondary | ICD-10-CM | POA: Insufficient documentation

## 2017-11-28 DIAGNOSIS — Z90710 Acquired absence of both cervix and uterus: Secondary | ICD-10-CM | POA: Diagnosis not present

## 2017-11-28 DIAGNOSIS — I10 Essential (primary) hypertension: Secondary | ICD-10-CM

## 2017-11-28 DIAGNOSIS — F1990 Other psychoactive substance use, unspecified, uncomplicated: Secondary | ICD-10-CM

## 2017-11-28 DIAGNOSIS — Z79899 Other long term (current) drug therapy: Secondary | ICD-10-CM | POA: Diagnosis not present

## 2017-11-28 DIAGNOSIS — I5032 Chronic diastolic (congestive) heart failure: Secondary | ICD-10-CM

## 2017-11-28 DIAGNOSIS — Z87891 Personal history of nicotine dependence: Secondary | ICD-10-CM | POA: Diagnosis not present

## 2017-11-28 HISTORY — DX: Other psychoactive substance use, unspecified, uncomplicated: F19.90

## 2017-11-28 HISTORY — DX: Other constipation: K59.09

## 2017-11-28 NOTE — Assessment & Plan Note (Signed)
She has recent illicit drug use with positive drug test She admits to smoking cocaine We discussed the importance of not using illicit drugs I recommend over-the-counter analgesics only for pain

## 2017-11-28 NOTE — Assessment & Plan Note (Signed)
We discussed the role of adjuvant chemotherapy We discussed chemo education class, port placement and chemo consent next week with plan to start first dose of chemo at the end of the month I will have to prescribe dose adjustment due to reduced creatinine clearance We discussed the importance of staying compliance with blood pressure management I will order MSI testing on her tissue sample

## 2017-11-28 NOTE — Progress Notes (Signed)
Angela Gardner CONSULT NOTE  Patient Care Team: Angela Gardner, Enobong, MD as PCP - General (Family Medicine)  ASSESSMENT & PLAN:  Endometrial cancer (HCC) We discussed the role of adjuvant chemotherapy We discussed chemo education class, port placement and chemo consent next week with plan to start first dose of chemo at the end of the month I will have to prescribe dose adjustment due to reduced creatinine clearance We discussed the importance of staying compliance with blood pressure management I will order MSI testing on her tissue sample   Essential hypertension She has poorly controlled hypertension causing chronic kidney disease stage III and recent heart failure We discussed the importance of staying compliant with medical management and close follow-up to see her primary doctor  Chronic kidney disease (CKD), stage III (moderate) (HCC) She had chronic kidney disease stage III due to poorly controlled hypertension I will prescribe medication dose adjustment  Diastolic congestive heart failure, NYHA class 3 (HCC) She has no signs or symptoms of congestive heart failure right now  Other constipation She had recent severe constipation We discussed laxative therapy  Illicit drug use She has recent illicit drug use with positive drug test She admits to smoking cocaine We discussed the importance of not using illicit drugs I recommend over-the-counter analgesics only for pain   Orders Placed This Encounter  Procedures  . IR Removal Tun Access W/ Port W/O FL    Standing Status:   Future    Standing Expiration Date:   01/29/2019    Order Specific Question:   Reason for exam:    Answer:   need port to start chemo on 6/27    Order Specific Question:   Is the patient pregnant?    Answer:   No    Order Specific Question:   Preferred Imaging Location?    Answer:   [facility]     CHIEF COMPLAINTS/PURPOSE OF CONSULTATION:  Uterine cancer, for adjuvant  treatment  HISTORY OF PRESENTING ILLNESS:  Angela Gardner 56 y.o. female is here because of recent diagnosis of uterine cancer. Her sister, Angela Gardner is present with her  have reviewed her chart and materials related to her cancer extensively and collaborated history with the patient. Summary of oncologic history is as follows: Oncology History   High grade serous with sarcomatoid features     Endometrial cancer (HCC)   05/21/2015 Imaging    US pelvis 1. Severe endometrial thickening at 35.2 mm. Large echogenic focus noted within the endometrial canal measuring 4.7 x 1.7 x 3.0 cm.  Endometrial fluid noted. These findings are suspicious for endometrial carcinoma .  2. Associated uterine enlargement. Two probable small fibroids are noted along the posterior fundus. The largest measures 3.7 cm.  3.  Ovaries not visualized.      05/21/2015 Initial Diagnosis    She presented with postmenopausal bleeding      06/15/2015 Pathology Results    Endometrium, biopsy - FRAGMENTED INACTIVE ENDOMETRIUM. - NO ATYPIA OR MALIGNANCY      09/13/2017 Imaging    Normal LV size with severe LV hypertrophy. EF 55%. Moderate diastolic dysfunction. Normal RV size and systolic function. No significant valvular abnormalities.      09/13/2017 Imaging    1. No acute intrathoracic, abdominal, or pelvic pathology. No aortic aneurysm or dissection. No CT evidence of pulmonary embolus. 2. Cardiomegaly with left ventricular hypertrophy      09/15/2017 Imaging    US pelvis Concern for large echogenic mass within the endometrium   measuring 3.8 x 3.8 x 3.6 cm. Recommend further evaluation with sonohysterogram or hysteroscopy/biopsy.  3.8 cm fundal subserosal fibroid.       10/09/2017 Pathology Results    Endometrium, biopsy - HIGH GRADE ADENOCARCINOMA. - SEE MICROSCOPIC DESCRIPTION. Microscopic Comment There is abundant secretory type endometrium and scattered fragments of high grade adenocarcinoma, which  focally has features suggestive of high grade serous carcinoma. There is also a microscopic focus with high grade carcinoma and associated spindled cell component. In this small focus a sarcomatous component cannot be ruled out; however, carcinoma with sarcomatoid features is favored.      11/14/2017 Pathology Results    1. Lymph node, sentinel, biopsy, left external iliac - ONE BENIGN LYMPH NODE (0/1). 2. Lymph nodes, regional resection, right pelvic - FIVE BENIGN LYMPH NODES (0/5). 3. 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 - CARCINOMA INVADES 0.5 CM IN A 3 CM MYOMETRIUM. - CERVIX, BILATERAL OVARIES AND BILATERAL FALLOPIAN TUBES FREE OF TUMOR. CERVIX: - NABOTHIAN CYSTS. - NO DYSPLASIA OR MALIGNANCY. MYOMETRIUM: - ADENOMYOSIS. - LEIOMYOMATA. RIGHT AND LEFT OVARIES: - UNREMARKABLE. - NO ENDOMETRIOSIS OR MALIGNANCY. RIGHT FALLOPIAN TUBE: - PREVIOUS TUBAL LIGATION. - NO ENDOMETRIOSIS OR MALIGNANCY. LEFT FALLOPIAN TUBE: - BENIGN ADENOMATOID TUMOR, 0.8 CM. - PREVIOUS TUBAL LIGATION. - NO ENDOMETRIOSIS OR MALIGNANCY. Microscopic Comment 3. UTERUS: Procedure: Hysterectomy with bilateral salpingo-oophorectomy and left external iliac and pelvic lymph nodes. Specimen Integrity: 1.5 cm irregular defect in the endocervical canal  Tumor Size: 2.3 cm. 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 Histologic Grade (required only for adenosarcoma): High grade, grade III Myometrial Invasion (required only for adenosarcoma): 0.5 cm in a 3 cm thick myometrium. Other Tissue/ Organ Involvement: No. Margins: Free of tumor. Lymphovascular Invasion: Not identified. Regional Lymph Nodes: No lymph nodes submitted or found: 6 All lymph nodes negative for tumor cells Number of Lymph Nodes Examined: 6 Pathologic Stage Classification (pTNM, AJCC 8th Edition):  pT1a, pN0 Ancillary Studies: Immunohistochemistry for cytokeratin AE1/AE3, cytokeratin 8/18, cytokeratin 903, cytokeratin 5/6 and p53. Representative Tumor Block: 3E, 41F, 3H, 3I, and 3J. Comment(s): The tumor is partially polypoid, is 2.3 cm in greatest dimension and has features of a high grade serous carcinoma. There is also an undifferentiated component with sarcomatoid features and immunohistochemistry shows positivity with cytokeratin 8/18 and is negative with cytokeratin AE1/AE3, cytokeratin 5/6, cytokeratin 903 and p53. The immunophenotype is most consistent with high grade carcinoma with sarcomatoid features. Immunohistochemistry for cytokeratin AE1/AE3 is performed on all of the lymph nodes (parts 1 and 2) and no positivity is identified.      11/14/2017 Surgery    Assistants: Dr Lahoma Crocker (an MD assistant was necessary for tissue manipulation, management of robotic instrumentation, retraction and positioning due to the complexity of the case and hospital policies).  Operation: Robotic-assisted laparoscopic total hysterectomy with bilateral salpingoophorectomy, SLN biopsy, right pelvic lymphadenectomy  Surgeon: Donaciano Eva  Operative Findings:  : 12cm bulky broad fibroid uterus. Pelvic adnexal adhesions between right ovary and fallopian tube and pelvic sidewall. Bladder adhesions from prior cesarean section. Grossly normal appearing ovaries. No apparent extrauterine disease. Multiple subserosal fibroids. Unilateral dye mapping to left. No suspicious lymph nodes.   Estimated Blood Loss:  less than 50 mL      Total IV Fluids: 800 ml         Specimens: uterus, cervix, bilateral tubes and ovaries, right pelvic lymph nodes, left external iliac  sentinel lymph node.          Complications:  None; patient tolerated the procedure well.        11/16/2017 Imaging    CT head No acute intracranial abnormalities to explain the patient's symptoms.      11/24/2017 Cancer  Staging    Staging form: Corpus Uteri - Carcinoma and Carcinosarcoma, AJCC 8th Edition - Pathologic: Stage IA (pT1a, pN0, cM0) - Signed by Gorsuch, Ni, MD on 11/24/2017      She complained of persistent postop pain and has ran out of her recent prescription pain medicine. She stated she is not able to refill her prescription because she has no money She has been out of work since February of this year She denies recent chest pain, shortness of breath or leg swelling She had recent severe constipation, resolved with laxatives She denies further vaginal bleeding  MEDICAL HISTORY:  Past Medical History:  Diagnosis Date  . Anemia   . Cancer (HCC)    left ductal papilloma  . CHF (congestive heart failure) (HCC)   . Dyspnea    still having this and not moving around alot -gets worse with exertion  . History of sleep walking   . Hx of left breast biopsy   . Hyperlipemia   . Hypertension    not on any medication now-[facility] had  prescribed her a med and she stopped taking them cannot remember when    SURGICAL HISTORY: Past Surgical History:  Procedure Laterality Date  . BREAST BIOPSY     left breast  . CESAREAN SECTION     one  . CHOLECYSTECTOMY    . DILATION AND CURETTAGE OF UTERUS     approx 1 year ago  . LYMPH NODE BIOPSY N/A 11/14/2017   Procedure: SENTINEL LYMPH NODE BIOPSY;  Surgeon: Rossi, Emma, MD;  Location: [facility];  Service: Gynecology;  Laterality: N/A;  . ROBOTIC ASSISTED TOTAL HYSTERECTOMY WITH BILATERAL SALPINGO OOPHERECTOMY N/A 11/14/2017   Procedure: XI ROBOTIC ASSISTED TOTAL HYSTERECTOMY WITH BILATERAL SALPINGO OOPHORECTOMY, RIGHT PELVIC LYMPHADENECTOMY;  Surgeon: Rossi, Emma, MD;  Location: [facility];  Service: Gynecology;  Laterality: N/A;    SOCIAL HISTORY: Social History   Socioeconomic History  . Marital status: Single    Spouse name: Not on file  . Number of children: 1  . Years of education: Not on file  . Highest education level: Not on file   Occupational History  . Not on file  Social Needs  . Financial resource strain: Not on file  . Food insecurity:    Worry: Not on file    Inability: Not on file  . Transportation needs:    Medical: Not on file    Non-medical: Not on file  Tobacco Use  . Smoking status: Former Smoker    Packs/day: 0.30    Years: 26.00    Pack years: 7.80    Types: Cigarettes  . Smokeless tobacco: Never Used  . Tobacco comment: quit May 2019  Substance and Sexual Activity  . Alcohol use: Yes    Comment: social drinking  . Drug use: Yes    Types: Cocaine    Comment: last time was one  to two weeks ago per patient   . Sexual activity: Not Currently    Birth control/protection: Post-menopausal  Lifestyle  . Physical activity:    Days per week: Not on file    Minutes per session: Not on file  . Stress: Not on file  Relationships  .   Social connections:    Talks on phone: Not on file    Gets together: Not on file    Attends religious service: Not on file    Active member of club or organization: Not on file    Attends meetings of clubs or organizations: Not on file    Relationship status: Not on file  . Intimate partner violence:    Fear of current or ex partner: Not on file    Emotionally abused: Not on file    Physically abused: Not on file    Forced sexual activity: Not on file  Other Topics Concern  . Not on file  Social History Narrative  . Not on file    FAMILY HISTORY: Family History  Problem Relation Age of Onset  . Anuerysm Mother   . Congestive Heart Failure Mother   . Hypertension Father   . Hypertension Sister   . Hypertension Brother     ALLERGIES:  is allergic to vicodin [hydrocodone-acetaminophen].  MEDICATIONS:  Current Outpatient Medications  Medication Sig Dispense Refill  . amLODipine (NORVASC) 10 MG tablet Take 1 tablet (10 mg total) by mouth daily. 90 tablet 3  . furosemide (LASIX) 40 MG tablet Take 1 tablet (40 mg total) by mouth daily. 30 tablet 5  .  hydrALAZINE (APRESOLINE) 10 MG tablet Take 1 tablet (10 mg total) by mouth 3 (three) times daily. 270 tablet 3  . metoprolol tartrate (LOPRESSOR) 25 MG tablet Take 25 mg by mouth 2 (two) times daily.    . polyethylene glycol (MIRALAX / GLYCOLAX) packet Take 17 g by mouth daily.    Marland Kitchen senna (SENOKOT) 8.6 MG TABS tablet Take 1 tablet by mouth daily.     No current facility-administered medications for this visit.     REVIEW OF SYSTEMS:   Constitutional: Denies fevers, chills or abnormal night sweats Eyes: Denies blurriness of vision, double vision or watery eyes Ears, nose, mouth, throat, and face: Denies mucositis or sore throat Respiratory: Denies cough, dyspnea or wheezes Cardiovascular: Denies palpitation, chest discomfort or lower extremity swelling Skin: Denies abnormal skin rashes Lymphatics: Denies new lymphadenopathy or easy bruising Neurological:Denies numbness, tingling or new weaknesses Behavioral/Psych: Mood is stable, no new changes  All other systems were reviewed with the patient and are negative.  PHYSICAL EXAMINATION: ECOG PERFORMANCE STATUS: 1 - Symptomatic but completely ambulatory  Vitals:   11/28/17 1056  BP: (!) 158/96  Pulse: 73  Resp: 18  Temp: 98.4 F (36.9 C)  SpO2: 99%   Filed Weights   11/28/17 1056  Weight: 231 lb 6.4 oz (105 kg)    GENERAL:alert, no distress and comfortable SKIN: skin color, texture, turgor are normal, no rashes or significant lesions EYES: normal, conjunctiva are pink and non-injected, sclera clear OROPHARYNX:no exudate, no erythema and lips, buccal mucosa, and tongue normal  NECK: supple, thyroid normal size, non-tender, without nodularity LYMPH:  no palpable lymphadenopathy in the cervical, axillary or inguinal LUNGS: clear to auscultation and percussion with normal breathing effort HEART: regular rate & rhythm and no murmurs and no lower extremity edema ABDOMEN:abdomen soft, non-tender and normal bowel sounds.  Well-healed  surgical scar Musculoskeletal:no cyanosis of digits and no clubbing  PSYCH: alert & oriented x 3 with fluent speech NEURO: no focal motor/sensory deficits  LABORATORY DATA:  I have reviewed the data as listed Lab Results  Component Value Date   WBC 5.2 11/17/2017   HGB 10.8 (L) 11/17/2017   HCT 33.5 (L) 11/17/2017   MCV 89.6  11/17/2017   PLT 261 11/17/2017   Recent Labs    10/26/17 0903  11/16/17 0504 11/16/17 1133 11/17/17 0845  NA 144   < > 140 136 139  K 3.7   < > 4.2 3.6 3.7  CL 109   < > 113* 112* 110  CO2 23   < > 20* 19* 23  GLUCOSE 105*   < > 103* 109* 122*  BUN 29*   < > 35* 36* 23*  CREATININE 1.76*   < > 2.43* 2.24* 1.59*  CALCIUM 9.0   < > 8.5* 8.5* 8.7*  GFRNONAA 31*   < > 21* 23* 36*  GFRAA 36*   < > 25* 27* 41*  PROT 7.0  --   --   --   --   ALBUMIN 3.6  --   --   --   --   AST 13*  --   --   --   --   ALT 14  --   --   --   --   ALKPHOS 69  --   --   --   --   BILITOT 0.4  --   --   --   --    < > = values in this interval not displayed.    RADIOGRAPHIC STUDIES: I have personally reviewed the radiological images as listed and agreed with the findings in the report. Dg Chest 2 View  Result Date: 11/16/2017 CLINICAL DATA:  Chest crackles. Nasal congestion. History of chronic kidney disease, diastolic CHF, and newly diagnosed endometrial cancer. EXAM: CHEST - 2 VIEW COMPARISON:  11/06/2017 FINDINGS: The cardiac silhouette is mildly to moderately enlarged, accentuated by AP technique on today's examination. There is mild elevation of the right hemidiaphragm. There is mild pulmonary vascular congestion without overt edema. Mild streaky infrahilar opacities bilaterally likely represent atelectasis. No sizable pleural effusion or pneumothorax is identified. No acute osseous abnormality is seen. IMPRESSION: Cardiomegaly with mild pulmonary vascular congestion. Electronically Signed   By: Logan Bores M.D.   On: 11/16/2017 12:15   Dg Chest 2 View  Result Date:  11/06/2017 CLINICAL DATA:  Coughing and wheezing for 2 days.  Smoker. EXAM: CHEST - 2 VIEW COMPARISON:  09/12/2017 FINDINGS: Mild pectus excavatum deformity. Midline trachea. Mild cardiomegaly. No pleural effusion or pneumothorax. Clear lungs. IMPRESSION: Cardiomegaly, without acute disease. Electronically Signed   By: Abigail Miyamoto M.D.   On: 11/06/2017 16:50   Dg Knee 1-2 Views Right  Result Date: 11/17/2017 CLINICAL DATA:  Pain following fall EXAM: RIGHT KNEE - 1-2 VIEW COMPARISON:  None. FINDINGS: Frontal and lateral views were obtained. No fracture or dislocation. No joint effusion. There is moderate narrowing of the patellofemoral joint. There is spurring in all compartments. No erosive change. 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 No evident fracture or joint effusion. Electronically Signed   By: Lowella Grip III M.D.   On: 11/17/2017 10:32   Ct Head Wo Contrast  Result Date: 11/16/2017 CLINICAL DATA:  Headache.  Status post fall. EXAM: CT HEAD WITHOUT CONTRAST TECHNIQUE: Contiguous axial images were obtained from the base of the skull through the vertex without intravenous contrast. COMPARISON:  May 21, 2015 FINDINGS: Brain: No evidence of acute infarction, hemorrhage, hydrocephalus, extra-axial collection or mass lesion/mass effect. Vascular: No hyperdense vessel or unexpected calcification. Skull: Normal. Negative for fracture or focal lesion. Sinuses/Orbits: Mild debris in the sphenoid sinuses. Paranasal sinuses, mastoid air cells, and middle ears are otherwise  normal. Other: None. IMPRESSION: No acute intracranial abnormalities to explain the patient's symptoms. Electronically Signed   By: Dorise Bullion III M.D   On: 11/16/2017 20:56   Dg Hip Unilat With Pelvis 2-3 Views Right  Result Date: 11/17/2017 CLINICAL DATA:  Pain following fall EXAM: DG HIP (WITH OR WITHOUT PELVIS) 2-3V RIGHT COMPARISON:  None. FINDINGS: Frontal pelvis as well as frontal  and lateral right hip images were obtained. There is no fracture or dislocation. There is slight symmetric narrowing both hip joints. Sacroiliac joints appear normal. No erosive change. IMPRESSION: Slight symmetric narrowing of each hip joint. No fracture or dislocation. Electronically Signed   By: Lowella Grip III M.D.   On: 11/17/2017 10:34   Dg Femur, Min 2 Views Right  Result Date: 11/17/2017 CLINICAL DATA:  Pain following fall EXAM: RIGHT FEMUR 2 VIEWS COMPARISON:  None. FINDINGS: Frontal and lateral views were obtained. There is no evident fracture or dislocation. Osteoarthritic changes noted in the right knee joint and to a lesser extent in the right hip joint. No erosive change. No knee joint effusion. IMPRESSION: Areas of osteoarthritic change.  No fracture or dislocation. Electronically Signed   By: Lowella Grip III M.D.   On: 11/17/2017 10:33    I spent 55 minutes counseling the patient face to face. The total time spent in the appointment was 60 minutes and more than 50% was on counseling.  All questions were answered. The patient knows to call the clinic with any problems, questions or concerns.  Heath Lark, MD 11/28/2017 12:04 PM

## 2017-11-28 NOTE — Assessment & Plan Note (Signed)
She had recent severe constipation We discussed laxative therapy

## 2017-11-28 NOTE — Progress Notes (Signed)
Patient called requesting financial assistance.  Asked patient what she was needing assistance with. Patient states everything. Advised patient that she may apply for the one-time $400 [place] which may help with personal expenses while going through treamtent. Patient is not working and is living with her sister. Advised patient letter of support is needed to apply. She allowed me to speak with her sister. Her sister states she will benefit from gas cards and medications and she will take care of the bills.  They will bring on 12/08/17 at next appointment to apply.

## 2017-11-28 NOTE — Addendum Note (Signed)
Addended by: Sharlynn Oliphant A on: 11/28/2017 12:17 PM   Modules accepted: Orders

## 2017-11-28 NOTE — Assessment & Plan Note (Signed)
She has poorly controlled hypertension causing chronic kidney disease stage III and recent heart failure We discussed the importance of staying compliant with medical management and close follow-up to see her primary doctor

## 2017-11-28 NOTE — Assessment & Plan Note (Signed)
She had chronic kidney disease stage III due to poorly controlled hypertension I will prescribe medication dose adjustment

## 2017-11-28 NOTE — Telephone Encounter (Signed)
Gave avs and calendar ° °

## 2017-11-28 NOTE — Assessment & Plan Note (Signed)
She has no signs or symptoms of congestive heart failure right now

## 2017-11-28 NOTE — Progress Notes (Signed)
START ON PATHWAY REGIMEN - Uterine     A cycle is every 21 days:     Paclitaxel      Carboplatin   **Always confirm dose/schedule in your pharmacy ordering system**  Patient Characteristics: Papillary Serous and Clear Cell Histology, Newly Diagnosed, Resected Histology: Papillary Serous and Clear Cell Histology Therapeutic Status: Newly Diagnosed AJCC T Category: T1a AJCC N Category: N0 AJCC M Category: M0 AJCC 8 Stage Grouping: IA Surgical Status: Resected Intent of Therapy: Curative Intent, Discussed with Patient

## 2017-12-04 ENCOUNTER — Other Ambulatory Visit: Payer: Self-pay | Admitting: Radiology

## 2017-12-04 ENCOUNTER — Telehealth: Payer: Self-pay | Admitting: Oncology

## 2017-12-04 NOTE — Telephone Encounter (Signed)
Requested IHC testing for MSI on Accession: [id number] with Suanne Marker in [facility].

## 2017-12-06 ENCOUNTER — Ambulatory Visit (HOSPITAL_COMMUNITY)
Admission: RE | Admit: 2017-12-06 | Discharge: 2017-12-06 | Disposition: A | Discharge status: Home / Self Care | Payer: PRIVATE HEALTH INSURANCE | Source: Ambulatory Visit | Attending: Hematology and Oncology | Admitting: Hematology and Oncology

## 2017-12-06 ENCOUNTER — Encounter (HOSPITAL_COMMUNITY): Payer: Self-pay

## 2017-12-06 ENCOUNTER — Other Ambulatory Visit: Payer: Self-pay | Admitting: Hematology and Oncology

## 2017-12-06 DIAGNOSIS — C541 Malignant neoplasm of endometrium: Secondary | ICD-10-CM

## 2017-12-06 DIAGNOSIS — E785 Hyperlipidemia, unspecified: Secondary | ICD-10-CM | POA: Insufficient documentation

## 2017-12-06 DIAGNOSIS — N189 Chronic kidney disease, unspecified: Secondary | ICD-10-CM | POA: Insufficient documentation

## 2017-12-06 DIAGNOSIS — I5032 Chronic diastolic (congestive) heart failure: Secondary | ICD-10-CM | POA: Insufficient documentation

## 2017-12-06 DIAGNOSIS — I130 Hypertensive heart and chronic kidney disease with heart failure and stage 1 through stage 4 chronic kidney disease, or unspecified chronic kidney disease: Secondary | ICD-10-CM | POA: Insufficient documentation

## 2017-12-06 DIAGNOSIS — D539 Nutritional anemia, unspecified: Secondary | ICD-10-CM

## 2017-12-06 DIAGNOSIS — Z8249 Family history of ischemic heart disease and other diseases of the circulatory system: Secondary | ICD-10-CM | POA: Insufficient documentation

## 2017-12-06 DIAGNOSIS — Z87891 Personal history of nicotine dependence: Secondary | ICD-10-CM | POA: Insufficient documentation

## 2017-12-06 DIAGNOSIS — Z885 Allergy status to narcotic agent status: Secondary | ICD-10-CM | POA: Insufficient documentation

## 2017-12-06 HISTORY — DX: Nutritional anemia, unspecified: D53.9

## 2017-12-06 HISTORY — PX: IR IMAGING GUIDED PORT INSERTION: IMG5740

## 2017-12-06 LAB — BASIC METABOLIC PANEL
ANION GAP: 9 (ref 5–15)
BUN: 20 mg/dL (ref 6–20)
CHLORIDE: 112 mmol/L — AB (ref 101–111)
CO2: 23 mmol/L (ref 22–32)
Calcium: 9.6 mg/dL (ref 8.9–10.3)
Creatinine, Ser: 1.23 mg/dL — ABNORMAL HIGH (ref 0.44–1.00)
GFR calc non Af Amer: 48 mL/min — ABNORMAL LOW (ref >60)
GFR, EST AFRICAN AMERICAN: 56 mL/min — AB (ref >60)
Glucose, Bld: 102 mg/dL — ABNORMAL HIGH (ref 65–99)
Potassium: 3.7 mmol/L (ref 3.5–5.1)
Sodium: 144 mmol/L (ref 135–145)

## 2017-12-06 LAB — CBC WITH DIFFERENTIAL/PLATELET
Basophils Absolute: 0 10*3/uL (ref 0.0–0.1)
Basophils Relative: 1 %
Eosinophils Absolute: 0.2 10*3/uL (ref 0.0–0.7)
Eosinophils Relative: 5 %
HEMATOCRIT: 38.2 % (ref 36.0–46.0)
HEMOGLOBIN: 12.7 g/dL (ref 12.0–15.0)
LYMPHS ABS: 1.6 10*3/uL (ref 0.7–4.0)
Lymphocytes Relative: 36 %
MCH: 29.7 pg (ref 26.0–34.0)
MCHC: 33.2 g/dL (ref 30.0–36.0)
MCV: 89.5 fL (ref 78.0–100.0)
MONOS PCT: 7 %
Monocytes Absolute: 0.3 10*3/uL (ref 0.1–1.0)
NEUTROS ABS: 2.3 10*3/uL (ref 1.7–7.7)
NEUTROS PCT: 51 %
Platelets: 344 10*3/uL (ref 150–400)
RBC: 4.27 MIL/uL (ref 3.87–5.11)
RDW: 15 % (ref 11.5–15.5)
WBC: 4.5 10*3/uL (ref 4.0–10.5)

## 2017-12-06 LAB — PROTIME-INR
INR: 0.89
Prothrombin Time: 11.9 seconds (ref 11.4–15.2)

## 2017-12-06 MED ORDER — FENTANYL CITRATE (PF) 100 MCG/2ML IJ SOLN
INTRAMUSCULAR | Status: AC
  Filled 2017-12-06: qty 4

## 2017-12-06 MED ORDER — LIDOCAINE HCL 1 % IJ SOLN
INTRAMUSCULAR | Status: AC
  Filled 2017-12-06: qty 20

## 2017-12-06 MED ORDER — CEFAZOLIN SODIUM-DEXTROSE 2-4 GM/100ML-% IV SOLN
2.0000 g | INTRAVENOUS | Status: AC
  Administered 2017-12-06: 2 g via INTRAVENOUS

## 2017-12-06 MED ORDER — FLUMAZENIL 0.5 MG/5ML IV SOLN
INTRAVENOUS | Status: AC
  Filled 2017-12-06: qty 5

## 2017-12-06 MED ORDER — MIDAZOLAM HCL 2 MG/2ML IJ SOLN
INTRAMUSCULAR | Status: AC | PRN
  Administered 2017-12-06 (×4): 1 mg via INTRAVENOUS

## 2017-12-06 MED ORDER — HEPARIN SOD (PORK) LOCK FLUSH 100 UNIT/ML IV SOLN
INTRAVENOUS | Status: AC | PRN
  Administered 2017-12-06: 500 [IU] via INTRAVENOUS

## 2017-12-06 MED ORDER — MIDAZOLAM HCL 2 MG/2ML IJ SOLN
INTRAMUSCULAR | Status: AC
  Filled 2017-12-06: qty 4

## 2017-12-06 MED ORDER — CEFAZOLIN SODIUM-DEXTROSE 2-4 GM/100ML-% IV SOLN
INTRAVENOUS | Status: AC
  Administered 2017-12-06: 2 g via INTRAVENOUS
  Filled 2017-12-06: qty 100

## 2017-12-06 MED ORDER — SODIUM CHLORIDE 0.9 % IV SOLN
INTRAVENOUS | Status: DC
  Administered 2017-12-06: 12:00:00 via INTRAVENOUS

## 2017-12-06 MED ORDER — FENTANYL CITRATE (PF) 100 MCG/2ML IJ SOLN
INTRAMUSCULAR | Status: AC | PRN
  Administered 2017-12-06 (×2): 50 ug via INTRAVENOUS

## 2017-12-06 MED ORDER — HEPARIN SOD (PORK) LOCK FLUSH 100 UNIT/ML IV SOLN
INTRAVENOUS | Status: AC
  Filled 2017-12-06: qty 5

## 2017-12-06 MED ORDER — NALOXONE HCL 0.4 MG/ML IJ SOLN
INTRAMUSCULAR | Status: AC
  Filled 2017-12-06: qty 1

## 2017-12-06 NOTE — Discharge Instructions (Signed)
Please keep your dressing on until your first infusion tomorrow. Do not take a shower for 24 hours.  Do not use EMLA cream until all steri strips and glue have come off.   Moderate Conscious Sedation, Adult, Care After These instructions provide you with information about caring for yourself after your procedure. Your health care provider may also give you more specific instructions. Your treatment has been planned according to current medical practices, but problems sometimes occur. Call your health care provider if you have any problems or questions after your procedure. What can I expect after the procedure? After your procedure, it is common:  To feel sleepy for several hours.  To feel clumsy and have poor balance for several hours.  To have poor judgment for several hours.  To vomit if you eat too soon.  Follow these instructions at home: For at least 24 hours after the procedure:   Do not: ? Participate in activities where you could fall or become injured. ? Drive. ? Use heavy machinery. ? Drink alcohol. ? Take sleeping pills or medicines that cause drowsiness. ? Make important decisions or sign legal documents. ? Take care of children on your own.  Rest. Eating and drinking  Follow the diet recommended by your health care provider.  If you vomit: ? Drink water, juice, or soup when you can drink without vomiting. ? Make sure you have little or no nausea before eating solid foods. General instructions  Have a responsible adult stay with you until you are awake and alert.  Take over-the-counter and prescription medicines only as told by your health care provider.  If you smoke, do not smoke without supervision.  Keep all follow-up visits as told by your health care provider. This is important. Contact a health care provider if:  You keep feeling nauseous or you keep vomiting.  You feel light-headed.  You develop a rash.  You have a fever. Get help right away  if:  You have trouble breathing. This information is not intended to replace advice given to you by your health care provider. Make sure you discuss any questions you have with your health care provider. Document Released: 03/27/2013 Document Revised: 11/09/2015 Document Reviewed: 09/26/2015 Elsevier Interactive Patient Education  2018 [place] Insertion, Care After This sheet gives you information about how to care for yourself after your procedure. Your health care provider may also give you more specific instructions. If you have problems or questions, contact your health care provider. What can I expect after the procedure? After your procedure, it is common to have:  Discomfort at the port insertion site.  Bruising on the skin over the port. This should improve over 3-4 days.  Follow these instructions at home: [facility] care  After your port is placed, you will get a manufacturer's information card. The card has information about your port. Keep this card with you at all times.  Take care of the port as told by your health care provider. Ask your health care provider if you or a family member can get training for taking care of the port at home. A home health care nurse may also take care of the port.  Make sure to remember what type of port you have. Incision care  Follow instructions from your health care provider about how to take care of your port insertion site. Make sure you: ? Wash your hands with soap and water before you change your bandage (dressing). If soap  and water are not available, use hand sanitizer. ? Change your dressing as told by your health care provider.  You may remove your dressing tomorrow. ? Leave skin glue in place. These skin closures may need to stay in place for 2 weeks or longer. If adhesive strip edges start to loosen and curl up, you may trim the loose edges. Do not remove adhesive strips completely unless your health care  provider tells you to do that.  DO NOT use EMLA cream for 2 weeks after port placement as this cream will remove surgical glue on your incision.  Check your port insertion site every day for signs of infection. Check for: ? More redness, swelling, or pain. ? More fluid or blood. ? Warmth. ? Pus or a bad smell. General instructions  Do not take baths, swim, or use a hot tub until your health care provider approves.  You may shower tomorrow.  Do not lift anything that is heavier than 10 lb (4.5 kg) for a week, or as told by your health care provider.  Ask your health care provider when it is okay to: ? Return to work or school. ? Resume usual physical activities or sports.  Do not drive for 24 hours if you were given a medicine to help you relax (sedative).  Take over-the-counter and prescription medicines only as told by your health care provider.  Wear a medical alert bracelet in case of an emergency. This will tell any health care providers that you have a port.  Keep all follow-up visits as told by your health care provider. This is important. Contact a health care provider if:  You have a fever or chills.  You have more redness, swelling, or pain around your port insertion site.  You have more fluid or blood coming from your port insertion site.  Your port insertion site feels warm to the touch.  You have pus or a bad smell coming from the port insertion site. Get help right away if:  You have chest pain or shortness of breath.  You have bleeding from your port that you cannot control. Summary  Take care of the port as told by your health care provider.  Change your dressing as told by your health care provider.  Keep all follow-up visits as told by your health care provider. This information is not intended to replace advice given to you by your health care provider. Make sure you discuss any questions you have with your health care provider. Document Released:  03/27/2013 Document Revised: 04/27/2016 Document Reviewed: 04/27/2016 Elsevier Interactive Patient Education  2017 [employer].

## 2017-12-06 NOTE — Sedation Documentation (Signed)
Patient is resting comfortably with eyes closed in NAD. 

## 2017-12-06 NOTE — Consult Note (Addendum)
Chief Complaint: Patient was seen in consultation today for Port-A-Cath placement  Referring Physician(s): [place]  Supervising Physician: Corrie Mckusick  Patient Status: [facility] - Out-pt  History of Present Illness: Angela Gardner is a 57 y.o. female with history of recently diagnosed endometrial carcinoma, status post surgery.  She presents today for Port-A-Cath placement for planned chemotherapy.  Past Medical History:  Diagnosis Date  . Anemia   . 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 CHF (congestive heart failure) ([place])   . Dyspnea    still having this and not moving around alot -gets worse with exertion  . History of sleep walking   . Hx of left breast biopsy   . Hyperlipemia   . Hypertension    not on any medication now-[facility] had  prescribed her a med and she stopped taking them cannot remember when    Past Surgical History:  Procedure Laterality Date  . BREAST BIOPSY     left breast  . CESAREAN SECTION     one  . CHOLECYSTECTOMY    . DILATION AND CURETTAGE OF UTERUS     approx 1 year ago  . LYMPH NODE BIOPSY N/A 11/14/2017   Procedure: SENTINEL LYMPH NODE BIOPSY;  Surgeon: Everitt Amber, MD;  Location: [facility] ORS;  Service: Gynecology;  Laterality: N/A;  . ROBOTIC ASSISTED TOTAL HYSTERECTOMY WITH BILATERAL SALPINGO OOPHERECTOMY N/A 11/14/2017   Procedure: XI ROBOTIC ASSISTED TOTAL HYSTERECTOMY WITH BILATERAL SALPINGO OOPHORECTOMY, RIGHT PELVIC LYMPHADENECTOMY;  Surgeon: Everitt Amber, MD;  Location: [facility] ORS;  Service: Gynecology;  Laterality: N/A;    Allergies: Vicodin [hydrocodone-acetaminophen]  Medications: Prior to Admission medications   Medication Sig Start Date End Date Taking? Authorizing Provider  amLODipine (NORVASC) 10 MG tablet Take 1 tablet (10 mg total) by mouth daily. 11/06/17 02/04/18 Yes Park Liter, MD  furosemide (LASIX) 40 MG tablet Take 1 tablet (40 mg total) by mouth daily. 09/16/17  Yes Rai, Ripudeep K, MD  hydrALAZINE  (APRESOLINE) 10 MG tablet Take 1 tablet (10 mg total) by mouth 3 (three) times daily. 10/27/17  Yes Park Liter, MD  metoprolol tartrate (LOPRESSOR) 25 MG tablet Take 25 mg by mouth 2 (two) times daily.   Yes [provider]  polyethylene glycol (MIRALAX / GLYCOLAX) packet Take 17 g by mouth daily.   Yes [provider]  senna (SENOKOT) 8.6 MG TABS tablet Take 1 tablet by mouth daily.   Yes [provider]     Family History  Problem Relation Age of Onset  . Anuerysm Mother   . Congestive Heart Failure Mother   . Hypertension Father   . Hypertension Sister   . Hypertension Brother     Social History   Socioeconomic History  . Marital status: Single    Spouse name: Not on file  . Number of children: 1  . Years of education: Not on file  . Highest education level: Not on file  Occupational History  . Not on file  Social Needs  . Financial resource strain: Not on file  . Food insecurity:    Worry: Not on file    Inability: Not on file  . Transportation needs:    Medical: Not on file    Non-medical: Not on file  Tobacco Use  . Smoking status: Former Smoker    Packs/day: 0.30    Years: 26.00    Pack years: 7.80    Types: Cigarettes  . Smokeless tobacco: Never Used  . Tobacco  comment: quit May 2019  Substance and Sexual Activity  . Alcohol use: Yes    Comment: social drinking  . Drug use: Yes    Types: Cocaine    Comment: last time was one  to two weeks ago per patient   . Sexual activity: Not Currently    Birth control/protection: Post-menopausal  Lifestyle  . Physical activity:    Days per week: Not on file    Minutes per session: Not on file  . Stress: Not on file  Relationships  . Social connections:    Talks on phone: Not on file    Gets together: Not on file    Attends religious service: Not on file    Active member of club or organization: Not on file    Attends meetings of clubs or organizations: Not on file     Relationship status: Not on file  Other Topics Concern  . Not on file  Social History Narrative  . Not on file      Review of Systems currently denies fever, headache, chest pain, worsening dyspnea, cough, back pain, nausea, vomiting or bleeding.  She does have some mild abdominal discomfort.  She is anxious.  Vital Signs: BP (!) 153/88 (BP Location: Left Arm)   Pulse 60   Temp 98 F (36.7 C) (Oral)   Resp 18   LMP  (LMP Unknown) Comment: vaginal bleeding continuously for last 8 months  SpO2 100%   Physical Exam awake, alert.  Chest clear to auscultation bilaterally.  Heart with regular rate and rhythm.  Abdomen obese, soft, positive bowel sounds, mild generalized tenderness to palpation.  Trace pretibial edema bilaterally. Imaging: Dg Chest 2 View  Result Date: 11/16/2017 CLINICAL DATA:  Chest crackles. Nasal congestion. History of chronic kidney disease, diastolic CHF, and newly diagnosed endometrial cancer. EXAM: CHEST - 2 VIEW COMPARISON:  11/06/2017 FINDINGS: The cardiac silhouette is mildly to moderately enlarged, accentuated by AP technique on today's examination. There is mild elevation of the right hemidiaphragm. There is mild pulmonary vascular congestion without overt edema. Mild streaky infrahilar opacities bilaterally likely represent atelectasis. No sizable pleural effusion or pneumothorax is identified. No acute osseous abnormality is seen. IMPRESSION: Cardiomegaly with mild pulmonary vascular congestion. Electronically Signed   By: Logan Bores M.D.   On: 11/16/2017 12:15   Dg Knee 1-2 Views Right  Result Date: 11/17/2017 CLINICAL DATA:  Pain following fall EXAM: RIGHT KNEE - 1-2 VIEW COMPARISON:  None. FINDINGS: Frontal and lateral views were obtained. No fracture or dislocation. No joint effusion. There is moderate narrowing of the patellofemoral joint. There is spurring in all compartments. No erosive change. 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 No evident fracture or joint effusion. Electronically Signed   By: Lowella Grip III M.D.   On: 11/17/2017 10:32   Ct Head Wo Contrast  Result Date: 11/16/2017 CLINICAL DATA:  Headache.  Status post fall. EXAM: CT HEAD WITHOUT CONTRAST TECHNIQUE: Contiguous axial images were obtained from the base of the skull through the vertex without intravenous contrast. COMPARISON:  May 21, 2015 FINDINGS: Brain: No evidence of acute infarction, hemorrhage, hydrocephalus, extra-axial collection or mass lesion/mass effect. Vascular: No hyperdense vessel or unexpected calcification. Skull: Normal. Negative for fracture or focal lesion. Sinuses/Orbits: Mild debris in the sphenoid sinuses. Paranasal sinuses, mastoid air cells, and middle ears are otherwise normal. Other: None. IMPRESSION: No acute intracranial abnormalities to explain the patient's symptoms. Electronically Signed   By: Dorise Bullion III M.D  On: 11/16/2017 20:56   Dg Hip Unilat With Pelvis 2-3 Views Right  Result Date: 11/17/2017 CLINICAL DATA:  Pain following fall EXAM: DG HIP (WITH OR WITHOUT PELVIS) 2-3V RIGHT COMPARISON:  None. FINDINGS: Frontal pelvis as well as frontal and lateral right hip images were obtained. There is no fracture or dislocation. There is slight symmetric narrowing both hip joints. Sacroiliac joints appear normal. No erosive change. IMPRESSION: Slight symmetric narrowing of each hip joint. No fracture or dislocation. Electronically Signed   By: Lowella Grip III M.D.   On: 11/17/2017 10:34   Dg Femur, Min 2 Views Right  Result Date: 11/17/2017 CLINICAL DATA:  Pain following fall EXAM: RIGHT FEMUR 2 VIEWS COMPARISON:  None. FINDINGS: Frontal and lateral views were obtained. There is no evident fracture or dislocation. Osteoarthritic changes noted in the right knee joint and to a lesser extent in the right hip joint. No erosive change. No knee joint effusion. IMPRESSION: Areas of osteoarthritic  change.  No fracture or dislocation. Electronically Signed   By: Lowella Grip III M.D.   On: 11/17/2017 10:33    Labs:  CBC: Recent Labs    11/15/17 1642 11/16/17 0504 11/16/17 1133 11/17/17 0845 12/06/17 1148  WBC 7.8 6.8  --  5.2 4.5  HGB 10.8* 10.0* 10.2* 10.8* 12.7  HCT 33.2* 31.9* 31.5* 33.5* 38.2  PLT 296 267  --  261 344    COAGS: Recent Labs    12/06/17 1148  INR 0.89    BMP: Recent Labs    11/16/17 0504 11/16/17 1133 11/17/17 0845 12/06/17 1148  NA 140 136 139 144  K 4.2 3.6 3.7 3.7  CL 113* 112* 110 112*  CO2 20* 19* 23 23  GLUCOSE 103* 109* 122* 102*  BUN 35* 36* 23* 20  CALCIUM 8.5* 8.5* 8.7* 9.6  CREATININE 2.43* 2.24* 1.59* 1.23*  GFRNONAA 21* 23* 36* 48*  GFRAA 25* 27* 41* 56*    LIVER FUNCTION TESTS: Recent Labs    10/26/17 0903  BILITOT 0.4  AST 13*  ALT 14  ALKPHOS 69  PROT 7.0  ALBUMIN 3.6    TUMOR MARKERS: No results for input(s): AFPTM, CEA, CA199, CHROMGRNA in the last 8760 hours.  Assessment and Plan: 57 y.o. female with history of recently diagnosed endometrial carcinoma, status post surgery.  She presents today for Port-A-Cath placement for planned chemotherapy.Risks and benefits of image guided port-a-catheter placement was discussed with the patient including, but not limited to bleeding, infection, pneumothorax, or fibrin sheath development and need for additional procedures.  All of the patient's questions were answered, patient is agreeable to proceed. Consent signed and in chart.     Thank you for this interesting consult.  I greatly enjoyed meeting Keitra Carusone and look forward to participating in their care.  A copy of this report was sent to the requesting provider on this date.  Electronically Signed: D. Rowe Robert, PA-C 12/06/2017, 1:15 PM   I spent a total of 25 minutes   in face to face in clinical consultation, greater than 50% of which was counseling/coordinating care for port a cath  placement

## 2017-12-06 NOTE — Procedures (Signed)
Interventional Radiology Procedure Note  Procedure: Placement of a right IJ approach single lumen PowerPort.  Tip is positioned at the superior cavoatrial junction and catheter is ready for immediate use.  Complications: None Recommendations:  - Ok to shower tomorrow - Do not submerge for 7 days - Routine line care   Signed,  Meryl Ponder S. Tabia Landowski, DO   

## 2017-12-07 ENCOUNTER — Ambulatory Visit: Payer: PRIVATE HEALTH INSURANCE | Admitting: Cardiology

## 2017-12-07 ENCOUNTER — Encounter: Payer: Self-pay | Admitting: *Deleted

## 2017-12-07 NOTE — Progress Notes (Signed)
[place] Psychosocial Distress Screening Clinical Social Work  Clinical Social Work was referred by distress screening protocol.  The patient scored a 10 on the Psychosocial Distress Thermometer which indicates severe distress. Clinical Social Worker attemted to contacted patient by phone to assess for distress and other psychosocial needs. CSW left voicemail to return call.  ONCBCN DISTRESS SCREENING 11/27/2017  Screening Type Initial Screening  Distress experienced in past week (1-10) 10  Emotional problem type Adjusting to illness  Physical Problem type Pain;Sleep/insomnia;Constipation/diarrhea  Referral to clinical social work Yes      Kennith [place], LCSW

## 2017-12-08 ENCOUNTER — Inpatient Hospital Stay (HOSPITAL_BASED_OUTPATIENT_CLINIC_OR_DEPARTMENT_OTHER): Payer: PRIVATE HEALTH INSURANCE | Admitting: Hematology and Oncology

## 2017-12-08 ENCOUNTER — Inpatient Hospital Stay: Payer: PRIVATE HEALTH INSURANCE

## 2017-12-08 ENCOUNTER — Encounter: Payer: Self-pay | Admitting: Gynecologic Oncology

## 2017-12-08 ENCOUNTER — Inpatient Hospital Stay (HOSPITAL_BASED_OUTPATIENT_CLINIC_OR_DEPARTMENT_OTHER): Payer: PRIVATE HEALTH INSURANCE | Admitting: Gynecologic Oncology

## 2017-12-08 ENCOUNTER — Other Ambulatory Visit: Payer: Self-pay | Admitting: Hematology and Oncology

## 2017-12-08 VITALS — BP 153/73 | HR 52 | Temp 97.7°F | Resp 17 | Ht 68.0 in | Wt 234.8 lb

## 2017-12-08 VITALS — BP 131/64 | HR 64 | Temp 98.0°F | Resp 20 | Ht 64.0 in | Wt 235.1 lb

## 2017-12-08 DIAGNOSIS — Z7189 Other specified counseling: Secondary | ICD-10-CM | POA: Diagnosis not present

## 2017-12-08 DIAGNOSIS — R300 Dysuria: Secondary | ICD-10-CM

## 2017-12-08 DIAGNOSIS — I130 Hypertensive heart and chronic kidney disease with heart failure and stage 1 through stage 4 chronic kidney disease, or unspecified chronic kidney disease: Secondary | ICD-10-CM | POA: Diagnosis not present

## 2017-12-08 DIAGNOSIS — N183 Chronic kidney disease, stage 3 unspecified: Secondary | ICD-10-CM

## 2017-12-08 DIAGNOSIS — Z90710 Acquired absence of both cervix and uterus: Secondary | ICD-10-CM

## 2017-12-08 DIAGNOSIS — C541 Malignant neoplasm of endometrium: Secondary | ICD-10-CM

## 2017-12-08 DIAGNOSIS — Z90722 Acquired absence of ovaries, bilateral: Secondary | ICD-10-CM

## 2017-12-08 DIAGNOSIS — Z79899 Other long term (current) drug therapy: Secondary | ICD-10-CM | POA: Diagnosis not present

## 2017-12-08 DIAGNOSIS — Z5111 Encounter for antineoplastic chemotherapy: Secondary | ICD-10-CM | POA: Diagnosis not present

## 2017-12-08 DIAGNOSIS — I10 Essential (primary) hypertension: Secondary | ICD-10-CM

## 2017-12-08 DIAGNOSIS — K5900 Constipation, unspecified: Secondary | ICD-10-CM

## 2017-12-08 DIAGNOSIS — I5032 Chronic diastolic (congestive) heart failure: Secondary | ICD-10-CM

## 2017-12-08 DIAGNOSIS — I5030 Unspecified diastolic (congestive) heart failure: Secondary | ICD-10-CM

## 2017-12-08 DIAGNOSIS — E785 Hyperlipidemia, unspecified: Secondary | ICD-10-CM | POA: Diagnosis not present

## 2017-12-08 LAB — CBC WITH DIFFERENTIAL (CANCER CENTER ONLY)
BASOS ABS: 0.1 10*3/uL (ref 0.0–0.1)
Basophils Relative: 1 %
EOS ABS: 0.1 10*3/uL (ref 0.0–0.5)
EOS PCT: 3 %
HCT: 36.2 % (ref 34.8–46.6)
Hemoglobin: 12 g/dL (ref 11.6–15.9)
Lymphocytes Relative: 39 %
Lymphs Abs: 1.7 10*3/uL (ref 0.9–3.3)
MCH: 29.2 pg (ref 25.1–34.0)
MCHC: 33.3 g/dL (ref 31.5–36.0)
MCV: 87.9 fL (ref 79.5–101.0)
MONO ABS: 0.4 10*3/uL (ref 0.1–0.9)
Monocytes Relative: 8 %
Neutro Abs: 2.2 10*3/uL (ref 1.5–6.5)
Neutrophils Relative %: 49 %
PLATELETS: 319 10*3/uL (ref 145–400)
RBC: 4.12 MIL/uL (ref 3.70–5.45)
RDW: 15.2 % — AB (ref 11.2–14.5)
WBC: 4.5 10*3/uL (ref 3.9–10.3)

## 2017-12-08 LAB — CMP (CANCER CENTER ONLY)
ALK PHOS: 97 U/L (ref 40–150)
ALT: 19 U/L (ref 0–55)
AST: 24 U/L (ref 5–34)
Albumin: 3.7 g/dL (ref 3.5–5.0)
Anion gap: 8 (ref 3–11)
BILIRUBIN TOTAL: 0.5 mg/dL (ref 0.2–1.2)
BUN: 19 mg/dL (ref 7–26)
CO2: 24 mmol/L (ref 22–29)
CREATININE: 1.18 mg/dL — AB (ref 0.60–1.10)
Calcium: 9.7 mg/dL (ref 8.4–10.4)
Chloride: 108 mmol/L (ref 98–109)
GFR, EST NON AFRICAN AMERICAN: 51 mL/min — AB (ref >60)
GFR, Est AFR Am: 59 mL/min — ABNORMAL LOW (ref >60)
GLUCOSE: 95 mg/dL (ref 70–140)
Potassium: 3.5 mmol/L (ref 3.5–5.1)
Sodium: 140 mmol/L (ref 136–145)
TOTAL PROTEIN: 7.2 g/dL (ref 6.4–8.3)

## 2017-12-08 MED ORDER — ONDANSETRON HCL 8 MG PO TABS: 8.0000 mg | ORAL_TABLET | Freq: Three times a day (TID) | ORAL | 1 refills | Status: DC | PRN

## 2017-12-08 MED ORDER — DEXAMETHASONE 4 MG PO TABS: ORAL_TABLET | ORAL | 0 refills | Status: DC

## 2017-12-08 MED ORDER — LIDOCAINE-PRILOCAINE 2.5-2.5 % EX CREA: TOPICAL_CREAM | CUTANEOUS | 3 refills | Status: DC

## 2017-12-08 MED ORDER — PROCHLORPERAZINE MALEATE 10 MG PO TABS: 10.0000 mg | ORAL_TABLET | Freq: Four times a day (QID) | ORAL | 1 refills | Status: DC | PRN

## 2017-12-08 MED FILL — DEXAMETHASONE 4 MG TABLET: 4 | 126 days supply | Qty: 60 | Fill #0

## 2017-12-08 MED FILL — PROCHLORPERAZINE 10 MG TAB: 10 | 7 days supply | Qty: 30 | Fill #0

## 2017-12-08 MED FILL — ONDANSETRON HCL 8 MG TABLET: 8 | 10 days supply | Qty: 30 | Fill #0

## 2017-12-08 MED FILL — LIDOCAINE-PRILOCAINE CREAM: 2.5-2.5 | 30 days supply | Qty: 30 | Fill #0

## 2017-12-08 NOTE — Assessment & Plan Note (Signed)
We reviewed the NCCN guidelines We discussed the role of chemotherapy. The intent is of curative intent.  We discussed some of the risks, benefits, side-effects of carboplatin & Taxol. Treatment is intravenous, every 3 weeks x 6 cycles  Some of the short term side-effects included, though not limited to, including weight loss, life threatening infections, risk of allergic reactions, need for transfusions of blood products, nausea, vomiting, change in bowel habits, loss of hair, admission to hospital for various reasons, and risks of death.   Long term side-effects are also discussed including risks of infertility, permanent damage to nerve function, hearing loss, chronic fatigue, kidney damage with possibility needing hemodialysis, and rare secondary malignancy including bone marrow disorders.  The patient is aware that the response rates discussed earlier is not guaranteed.  After a long discussion, patient made an informed decision to proceed with the prescribed plan of care.   Patient education material was dispensed. We discussed premedication with dexamethasone before chemotherapy. Due to her young age, I will not prescribe G-CSF support Due to large body mass index, I will reduce the dose of Taxol a little bit for anticipated risk of neuropathy

## 2017-12-08 NOTE — Assessment & Plan Note (Signed)
Her kidney function is stable We will adjust chemotherapy accordingly

## 2017-12-08 NOTE — Assessment & Plan Note (Signed)
She has no clinical signs or symptoms of congestive heart failure 

## 2017-12-08 NOTE — Progress Notes (Signed)
Follow-up Note: Gyn-Onc  Consult was requested by Dr. Hulan Fray for the evaluation of Angela Gardner 57 y.o. female  CC:  Chief Complaint  Patient presents with  . Endometrial cancer [facility])    Assessment/Plan:  Angela Gardner  is a 57 y.o.  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  High risk factors for recurrence given the unfavorable cell type. Recommendation is for 6 cycles of carboplatin and paclitaxel chemotherapy and vaginal brachytherapy to reduce risk for local recurrence in accordance with NCCN guidelines.  I discussed this with the patient. I discussed the role of adjuvant therapy. I discussed prognosis and risk for recurrence. We reviewed symptoms concerning for recurrence and she will see me if these develop prior to her scheduled appointment.  After completing adjuvant therapy I recommend she follow-up at 3 monthly intervals for symptom review, physical examination and pelvic examination. Pap smear is not recommended in routine endometrial cancer surveillance. After 2 years we will space these visits to every 6 months, and then annually if recurrence has not developed within 5 years. All questions were answered.  UA and culture sent today given symptoms of dysuria.   HPI: Angela Gardner is a 57 year old P1 who is seen in consultation at the request of Dr Hulan Fray for grade 3 endometrial cancer.   The patient transitioned through menopause prior to age 57.  She had an episode of postmenopausal bleeding in approximately 2017 at which time she had a D&C procedure revealing benign pathology.  She stopped vaginal bleeding until approximately November 2018 when she began having heavy menstrual-like bleeding and was started on Megace.  This helped some however bleeding persisted and therefore she underwent a transvaginal ultrasound scan on 09/15/2017 this revealed a uterus measuring 11.2 x 5 x 6.9 cm with a subserosal fundal fibroid measuring up to  3.8 cm.  The endometrial thickness was 7 mm with concern for an echogenic mass centrally within the endometrium measuring 3.8 x 3.8 x 3.6 cm.  She then underwent an endometrial Pipelle biopsy on 10/09/2017 which revealed high-grade adenocarcinoma.  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  Due to the finding of a high-grade endometrial cancer that she underwent a CT scan of the chest abdomen and pelvis on September 12, 2017 this revealed grossly unremarkable uterus and ovaries, no lymphadenopathy, no gross evidence of extrauterine disease or metastases.  The patient otherwise is obese with hypertension and hypercholesterolemia.  She has had one prior pregnancy which resulted in a cesarean delivery.  She has had an open cholecystectomy.  Interval Hx:  On 11/14/17 she underwent robotically assisted total hysterectomy, BSO, SLN biopsy. Postoperatively her recovery was complicated by dizziness, dehydration and azotemia requiring an additional night's stay in the hospital. This resolved with hydration. Preop urine tox screen was positive for cocaine. .  Final pathology revealed a 2.3cm high grade serous endometrial cancer with sarcomatoid features. The carcinoma invades 0.5cm of 3cm in myometrium. Lymph nodes (left sentinel, right pelvic) were negative. No LVSI was present. No cervical or adnexal involvement. She was determined to have stage IA high grade serous carcinoma of the uterus and was recommended to receive adjuvant therapy in the form of 6 cycles of carboplatin and paclitaxel and vaginal brachytherapy in accordance with NCCN guidelines.   Current Meds:  Outpatient Encounter Medications as of 12/08/2017  Medication Sig  . amLODipine (NORVASC) 10 MG tablet Take  1 tablet (10 mg total) by mouth daily.  . furosemide (LASIX) 40 MG tablet Take 1 tablet (40 mg total) by mouth daily.  . hydrALAZINE (APRESOLINE) 10 MG tablet Take 1 tablet  (10 mg total) by mouth 3 (three) times daily.  . metoprolol tartrate (LOPRESSOR) 25 MG tablet Take 25 mg by mouth 2 (two) times daily.  . polyethylene glycol (MIRALAX / GLYCOLAX) packet Take 17 g by mouth daily.  Marland Kitchen senna (SENOKOT) 8.6 MG TABS tablet Take 1 tablet by mouth daily.  Marland Kitchen dexamethasone (DECADRON) 4 MG tablet Take 5 tabs at the night before and 5 tab the morning of chemotherapy, every 3 weeks, by mouth  . lidocaine-prilocaine (EMLA) cream Apply to affected area once (Patient not taking: Reported on 12/08/2017)  . ondansetron (ZOFRAN) 8 MG tablet Take 1 tablet (8 mg total) by mouth every 8 (eight) hours as needed for refractory nausea / vomiting. Start on day 3 after chemo. (Patient not taking: Reported on 12/08/2017)  . prochlorperazine (COMPAZINE) 10 MG tablet Take 1 tablet (10 mg total) by mouth every 6 (six) hours as needed (Nausea or vomiting). (Patient not taking: Reported on 12/08/2017)   No facility-administered encounter medications on file as of 12/08/2017.     Allergy:  Allergies  Allergen Reactions  . Vicodin [Hydrocodone-Acetaminophen] Itching    Social Hx:   Social History   Socioeconomic History  . Marital status: Single    Spouse name: Not on file  . Number of children: 1  . Years of education: Not on file  . Highest education level: Not on file  Occupational History  . Not on file  Social Needs  . Financial resource strain: Not on file  . Food insecurity:    Worry: Not on file    Inability: Not on file  . Transportation needs:    Medical: Not on file    Non-medical: Not on file  Tobacco Use  . Smoking status: Former Smoker    Packs/day: 0.30    Years: 26.00    Pack years: 7.80    Types: Cigarettes  . Smokeless tobacco: Never Used  . Tobacco comment: quit May 2019  Substance and Sexual Activity  . Alcohol use: Yes    Comment: social drinking  . Drug use: Yes    Types: Cocaine    Comment: last time was one  to two weeks ago per patient   . Sexual  activity: Not Currently    Birth control/protection: Post-menopausal  Lifestyle  . Physical activity:    Days per week: Not on file    Minutes per session: Not on file  . Stress: Not on file  Relationships  . Social connections:    Talks on phone: Not on file    Gets together: Not on file    Attends religious service: Not on file    Active member of club or organization: Not on file    Attends meetings of clubs or organizations: Not on file    Relationship status: Not on file  . Intimate partner violence:    Fear of current or ex partner: Not on file    Emotionally abused: Not on file    Physically abused: Not on file    Forced sexual activity: Not on file  Other Topics Concern  . Not on file  Social History Narrative  . Not on file    Past Surgical Hx:  Past Surgical History:  Procedure Laterality Date  . BREAST BIOPSY  left breast  . CESAREAN SECTION     one  . CHOLECYSTECTOMY    . DILATION AND CURETTAGE OF UTERUS     approx 1 year ago  . IR IMAGING GUIDED PORT INSERTION  12/06/2017  . LYMPH NODE BIOPSY N/A 11/14/2017   Procedure: SENTINEL LYMPH NODE BIOPSY;  Surgeon: Everitt Amber, MD;  Location: [facility];  Service: Gynecology;  Laterality: N/A;  . ROBOTIC ASSISTED TOTAL HYSTERECTOMY WITH BILATERAL SALPINGO OOPHERECTOMY N/A 11/14/2017   Procedure: XI ROBOTIC ASSISTED TOTAL HYSTERECTOMY WITH BILATERAL SALPINGO OOPHORECTOMY, RIGHT PELVIC LYMPHADENECTOMY;  Surgeon: Everitt Amber, MD;  Location: [facility];  Service: Gynecology;  Laterality: N/A;    Past Medical Hx:  Past Medical History:  Diagnosis Date  . Anemia   . 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 CHF (congestive heart failure) ([place])   . Dyspnea    still having this and not moving around alot -gets worse with exertion  . History of sleep walking   . Hx of left breast biopsy   . Hyperlipemia   . Hypertension    not on any medication now-[facility] had  prescribed her a med and she stopped taking them cannot  remember when    Past Gynecological History:  C/s x 1, positive high risk HPV in 2019. No LMP recorded (lmp unknown). Patient has had a hysterectomy.  Family Hx:  Family History  Problem Relation Age of Onset  . Anuerysm Mother   . Congestive Heart Failure Mother   . Hypertension Father   . Hypertension Sister   . Hypertension Brother     Review of Systems:  Constitutional  Feels well,    ENT Normal appearing ears and nares bilaterally Skin/Breast  No rash, sores, jaundice, itching, dryness Cardiovascular  No chest pain, shortness of breath, or edema  Pulmonary  No cough or wheeze.  Gastro Intestinal  No nausea, vomitting, or diarrhoea. No bright red blood per rectum, no abdominal pain, change in bowel movement, or constipation.  Genito Urinary  No frequency, urgency, dysuria, + bleeding Musculo Skeletal  No myalgia, arthralgia, joint swelling or pain  Neurologic  No weakness, numbness, change in gait,  Psychology  No depression, anxiety, insomnia.   Vitals:  Blood pressure 131/64, pulse 64, temperature 98 F (36.7 C), temperature source Oral, resp. rate 20, height 5\' 4"  (1.626 m), weight 235 lb 1.6 oz (106.6 kg), SpO2 99 %.  Physical Exam: WD in NAD Neck  Supple NROM, without any enlargements.  Lymph Node Survey No cervical supraclavicular or inguinal adenopathy Cardiovascular  Pulse normal rate, regularity and rhythm. S1 and S2 normal.  Lungs  Clear to auscultation bilateraly, without wheezes/crackles/rhonchi. Good air movement.  Skin  No rash/lesions/breakdown  Psychiatry  Alert and oriented to person, place, and time  Abdomen  Normoactive bowel sounds, abdomen soft, non-tender and obese without evidence of hernia. Incisions are well healed Back No CVA tenderness Genito Urinary  Vaginal cuff well healed, no blood or discharge, no lesions. No palpable masses.  Rectal  deferred Extremities  No bilateral cyanosis, clubbing or edema.   Thereasa Solo,  MD  12/08/2017, 4:35 PM

## 2017-12-08 NOTE — Patient Instructions (Signed)
Please return to see Dr Alvy Bimler as scheduled to start chemotherapy.  Dr Alvy Bimler will schedule you to see Dr Denman George when treatment is completed.   Contact Dr Denman George with surgical related questions at [phone].

## 2017-12-11 ENCOUNTER — Encounter: Payer: Self-pay | Admitting: Hematology and Oncology

## 2017-12-11 NOTE — Assessment & Plan Note (Signed)
With adequate hydration, her serum creatinine has improved We will continue to monitor carefully

## 2017-12-11 NOTE — Progress Notes (Signed)
[place] OFFICE PROGRESS NOTE  Patient Care Team: Charlott Rakes, MD as PCP - General (Family Medicine)  ASSESSMENT & PLAN:  Endometrial cancer [facility]) We reviewed the NCCN guidelines We discussed the role of chemotherapy. The intent is of curative intent.  We discussed some of the risks, benefits, side-effects of carboplatin & Taxol. Treatment is intravenous, every 3 weeks x 6 cycles  Some of the short term side-effects included, though not limited to, including weight loss, life threatening infections, risk of allergic reactions, need for transfusions of blood products, nausea, vomiting, change in bowel habits, loss of hair, admission to hospital for various reasons, and risks of death.   Long term side-effects are also discussed including risks of infertility, permanent damage to nerve function, hearing loss, chronic fatigue, kidney damage with possibility needing hemodialysis, and rare secondary malignancy including bone marrow disorders.  The patient is aware that the response rates discussed earlier is not guaranteed.  After a long discussion, patient made an informed decision to proceed with the prescribed plan of care.   Patient education material was dispensed. We discussed premedication with dexamethasone before chemotherapy. Due to her young age, she does not need G-CSF support  Chronic kidney disease (CKD), stage III (moderate) (HCC) With adequate hydration, her serum creatinine has improved We will continue to monitor carefully  Diastolic congestive heart failure, NYHA class 3 (HCC) She has chronic bilateral lower extremity edema She will continue medical management  Essential hypertension Her blood pressure remained chronically elevated I will consider medication adjustment if her blood pressure remains high chronically   No orders of the defined types were placed in this encounter.   INTERVAL HISTORY: Please see below for problem oriented  charting. 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 Severe endometrial thickening at 35.2 mm. Large echogenic focus noted within the endometrial canal measuring 4.7 x 1.7 x 3.0 cm.  Endometrial fluid noted. These findings are suspicious for endometrial carcinoma .  2. Associated uterine enlargement. Two probable small fibroids are noted along the posterior fundus. The largest measures 3.7 cm.  3.  Ovaries not visualized.      05/21/2015 Initial Diagnosis    She presented with postmenopausal bleeding      06/15/2015 Pathology Results    Endometrium, biopsy - FRAGMENTED INACTIVE ENDOMETRIUM. - NO ATYPIA OR MALIGNANCY      09/13/2017 Imaging    Normal LV size with severe LV hypertrophy. EF 55%. Moderate diastolic dysfunction. Normal RV size and systolic function. No significant valvular abnormalities.      09/13/2017 Imaging    1. No acute intrathoracic, abdominal, or pelvic pathology. No aortic aneurysm or dissection. No CT evidence of pulmonary embolus. 2. Cardiomegaly with left ventricular hypertrophy      09/15/2017 Imaging    US pelvis Concern for large echogenic mass within the endometrium measuring 3.8 x 3.8 x 3.6 cm. Recommend further evaluation with sonohysterogram or hysteroscopy/biopsy.  3.8 cm fundal subserosal fibroid.       10/09/2017 Pathology Results    Endometrium, biopsy -  HIGH GRADE ADENOCARCINOMA. - SEE MICROSCOPIC DESCRIPTION. Microscopic Comment There is abundant secretory type endometrium and scattered fragments of high grade  adenocarcinoma, which focally has features suggestive of high grade serous carcinoma. There is also a microscopic focus with high grade carcinoma and associated spindled cell component. In this small focus a sarcomatous component cannot be ruled out; however, carcinoma with sarcomatoid features is favored.      11/14/2017 Pathology Results    1. Lymph node, sentinel, biopsy, left external iliac - ONE BENIGN LYMPH NODE (0/1). 2. Lymph nodes, regional resection, right pelvic - FIVE BENIGN LYMPH NODES (0/5). 3. 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 - CARCINOMA INVADES 0.5 CM IN A 3 CM MYOMETRIUM. - CERVIX, BILATERAL OVARIES AND BILATERAL FALLOPIAN TUBES FREE OF TUMOR. CERVIX: - NABOTHIAN CYSTS. - NO DYSPLASIA OR MALIGNANCY. MYOMETRIUM: - ADENOMYOSIS. - LEIOMYOMATA. RIGHT AND LEFT OVARIES: - UNREMARKABLE. - NO ENDOMETRIOSIS OR MALIGNANCY. RIGHT FALLOPIAN TUBE: - PREVIOUS TUBAL LIGATION. - NO ENDOMETRIOSIS OR MALIGNANCY. LEFT FALLOPIAN TUBE: - BENIGN ADENOMATOID TUMOR, 0.8 CM. - PREVIOUS TUBAL LIGATION. - NO ENDOMETRIOSIS OR MALIGNANCY. Microscopic Comment 3. UTERUS: Procedure: Hysterectomy with bilateral salpingo-oophorectomy and left external iliac and pelvic lymph nodes. Specimen Integrity: 1.5 cm irregular defect in the endocervical canal  Tumor Size: 2.3 cm. 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 Histologic Grade (required only for adenosarcoma): High grade, grade III Myometrial Invasion (required only for adenosarcoma): 0.5 cm in a 3 cm thick myometrium. Other Tissue/ Organ Involvement: No. Margins: Free of tumor. Lymphovascular Invasion: Not identified. Regional Lymph Nodes: No lymph nodes submitted or found: 6 All lymph nodes negative for tumor cells Number of Lymph Nodes Examined: 6 Pathologic Stage Classification  (pTNM, AJCC 8th Edition): pT1a, pN0 Ancillary Studies: Immunohistochemistry for cytokeratin AE1/AE3, cytokeratin 8/18, cytokeratin 903, cytokeratin 5/6 and p53. Representative Tumor Block: 3E, 62F, 3H, 3I, and 3J. Comment(s): The tumor is partially polypoid, is 2.3 cm in greatest dimension and has features of a high grade serous carcinoma. There is also an undifferentiated component with sarcomatoid features and immunohistochemistry shows positivity with cytokeratin 8/18 and is negative with cytokeratin AE1/AE3, cytokeratin 5/6, cytokeratin 903 and p53. The immunophenotype is most consistent with high grade carcinoma with sarcomatoid features. Immunohistochemistry for cytokeratin AE1/AE3 is performed on all of the lymph nodes (parts 1 and 2) and no positivity is identified.      11/14/2017 Surgery    Assistants: Dr Lahoma Crocker (an MD assistant was necessary for tissue manipulation, management of robotic instrumentation, retraction and positioning due to the complexity of the case and hospital policies).  Operation: Robotic-assisted laparoscopic total hysterectomy with bilateral salpingoophorectomy, SLN biopsy, right pelvic lymphadenectomy  Surgeon: Donaciano Eva  Operative Findings:  : 12cm bulky broad fibroid uterus. Pelvic adnexal adhesions between right ovary and fallopian tube and pelvic sidewall. Bladder adhesions from prior cesarean section. Grossly normal appearing ovaries. No apparent extrauterine disease. Multiple subserosal fibroids. Unilateral dye mapping to left. No suspicious lymph nodes.   Estimated Blood Loss:  less than 50 mL      Total IV Fluids: 800 ml         Specimens: uterus, cervix, bilateral tubes and ovaries, right pelvic lymph nodes, left external iliac sentinel lymph node.          Complications:  None; patient tolerated the procedure well.        11/16/2017 Imaging    CT head No  acute intracranial abnormalities to explain the patient's  symptoms.      11/24/2017 Cancer Staging    Staging form: Corpus Uteri - Carcinoma and Carcinosarcoma, AJCC 8th Edition - Pathologic: Stage IA (pT1a, pN0, cM0) - Signed by Heath Lark, MD on 11/24/2017      12/06/2017 Procedure    Status post right IJ port catheter placement. Catheter ready for use       REVIEW OF SYSTEMS:   Constitutional: Denies fevers, chills or abnormal weight loss Eyes: Denies blurriness of vision Ears, nose, mouth, throat, and face: Denies mucositis or sore throat Respiratory: Denies cough, dyspnea or wheezes Cardiovascular: Denies palpitation, chest discomfort  Gastrointestinal:  Denies nausea, heartburn or change in bowel habits Skin: Denies abnormal skin rashes Lymphatics: Denies new lymphadenopathy or easy bruising Neurological:Denies numbness, tingling or new weaknesses Behavioral/Psych: Mood is stable, no new changes  All other systems were reviewed with the patient and are negative.  I have reviewed the past medical history, past surgical history, social history and family history with the patient and they are unchanged from previous note.  ALLERGIES:  is allergic to vicodin [hydrocodone-acetaminophen].  MEDICATIONS:  Current Outpatient Medications  Medication Sig Dispense Refill  . amLODipine (NORVASC) 10 MG tablet Take 1 tablet (10 mg total) by mouth daily. 90 tablet 3  . dexamethasone (DECADRON) 4 MG tablet Take 5 tabs at the night before and 5 tab the morning of chemotherapy, every 3 weeks, by mouth 60 tablet 0  . furosemide (LASIX) 40 MG tablet Take 1 tablet (40 mg total) by mouth daily. 30 tablet 5  . hydrALAZINE (APRESOLINE) 10 MG tablet Take 1 tablet (10 mg total) by mouth 3 (three) times daily. 270 tablet 3  . lidocaine-prilocaine (EMLA) cream Apply to affected area once (Patient not taking: Reported on 12/08/2017) 30 g 3  . metoprolol tartrate (LOPRESSOR) 25 MG tablet Take 25 mg by mouth 2 (two) times daily.    . ondansetron (ZOFRAN) 8 MG  tablet Take 1 tablet (8 mg total) by mouth every 8 (eight) hours as needed for refractory nausea / vomiting. Start on day 3 after chemo. (Patient not taking: Reported on 12/08/2017) 30 tablet 1  . polyethylene glycol (MIRALAX / GLYCOLAX) packet Take 17 g by mouth daily.    . prochlorperazine (COMPAZINE) 10 MG tablet Take 1 tablet (10 mg total) by mouth every 6 (six) hours as needed (Nausea or vomiting). (Patient not taking: Reported on 12/08/2017) 30 tablet 1  . senna (SENOKOT) 8.6 MG TABS tablet Take 1 tablet by mouth daily.     No current facility-administered medications for this visit.     PHYSICAL EXAMINATION: ECOG PERFORMANCE STATUS: 1 - Symptomatic but completely ambulatory  Vitals:   12/08/17 1347  BP: (!) 153/73  Pulse: (!) 52  Resp: 17  Temp: 97.7 F (36.5 C)  SpO2: 100%   Filed Weights   12/08/17 1347  Weight: 234 lb 12.8 oz (106.5 kg)    GENERAL:alert, no distress and comfortable SKIN: skin color, texture, turgor are normal, no rashes or significant lesions EYES: normal, Conjunctiva are pink and non-injected, sclera clear OROPHARYNX:no exudate, no erythema and lips, buccal mucosa, and tongue normal  NECK: supple, thyroid normal size, non-tender, without nodularity LYMPH:  no palpable lymphadenopathy in the cervical, axillary or inguinal LUNGS: clear to auscultation and percussion with normal breathing effort HEART: regular rate & rhythm and no murmurs with mild bilateral lower extremity edema ABDOMEN:abdomen soft, non-tender and normal bowel sounds Musculoskeletal:no cyanosis of  digits and no clubbing.  The port site appears healing well NEURO: alert & oriented x 3 with fluent speech, no focal motor/sensory deficits  LABORATORY DATA:  I have reviewed the data as listed    Component Value Date/Time   NA 140 12/08/2017 1051   K 3.5 12/08/2017 1051   CL 108 12/08/2017 1051   CO2 24 12/08/2017 1051   GLUCOSE 95 12/08/2017 1051   BUN 19 12/08/2017 1051   CREATININE  1.18 (H) 12/08/2017 1051   CALCIUM 9.7 12/08/2017 1051   PROT 7.2 12/08/2017 1051   ALBUMIN 3.7 12/08/2017 1051   AST 24 12/08/2017 1051   ALT 19 12/08/2017 1051   ALKPHOS 97 12/08/2017 1051   BILITOT 0.5 12/08/2017 1051   GFRNONAA 51 (L) 12/08/2017 1051   GFRAA 59 (L) 12/08/2017 1051    No results found for: SPEP, UPEP  Lab Results  Component Value Date   WBC 4.5 12/08/2017   NEUTROABS 2.2 12/08/2017   HGB 12.0 12/08/2017   HCT 36.2 12/08/2017   MCV 87.9 12/08/2017   PLT 319 12/08/2017      Chemistry      Component Value Date/Time   NA 140 12/08/2017 1051   K 3.5 12/08/2017 1051   CL 108 12/08/2017 1051   CO2 24 12/08/2017 1051   BUN 19 12/08/2017 1051   CREATININE 1.18 (H) 12/08/2017 1051      Component Value Date/Time   CALCIUM 9.7 12/08/2017 1051   ALKPHOS 97 12/08/2017 1051   AST 24 12/08/2017 1051   ALT 19 12/08/2017 1051   BILITOT 0.5 12/08/2017 1051       RADIOGRAPHIC STUDIES: I have personally reviewed the radiological images as listed and agreed with the findings in the report. Dg Chest 2 View  Result Date: 11/16/2017 CLINICAL DATA:  Chest crackles. Nasal congestion. History of chronic kidney disease, diastolic CHF, and newly diagnosed endometrial cancer. EXAM: CHEST - 2 VIEW COMPARISON:  11/06/2017 FINDINGS: The cardiac silhouette is mildly to moderately enlarged, accentuated by AP technique on today's examination. There is mild elevation of the right hemidiaphragm. There is mild pulmonary vascular congestion without overt edema. Mild streaky infrahilar opacities bilaterally likely represent atelectasis. No sizable pleural effusion or pneumothorax is identified. No acute osseous abnormality is seen. IMPRESSION: Cardiomegaly with mild pulmonary vascular congestion. Electronically Signed   By: Logan Bores M.D.   On: 11/16/2017 12:15   Dg Knee 1-2 Views Right  Result Date: 11/17/2017 CLINICAL DATA:  Pain following fall EXAM: RIGHT KNEE - 1-2 VIEW  COMPARISON:  None. FINDINGS: Frontal and lateral views were obtained. No fracture or dislocation. No joint effusion. There is moderate narrowing of the patellofemoral joint. There is spurring in all compartments. No erosive change. 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 No evident fracture or joint effusion. Electronically Signed   By: Lowella Grip III M.D.   On: 11/17/2017 10:32   Ct Head Wo Contrast  Result Date: 11/16/2017 CLINICAL DATA:  Headache.  Status post fall. EXAM: CT HEAD WITHOUT CONTRAST TECHNIQUE: Contiguous axial images were obtained from the base of the skull through the vertex without intravenous contrast. COMPARISON:  May 21, 2015 FINDINGS: Brain: No evidence of acute infarction, hemorrhage, hydrocephalus, extra-axial collection or mass lesion/mass effect. Vascular: No hyperdense vessel or unexpected calcification. Skull: Normal. Negative for fracture or focal lesion. Sinuses/Orbits: Mild debris in the sphenoid sinuses. Paranasal sinuses, mastoid air cells, and middle ears are otherwise normal. Other: None. IMPRESSION: No acute intracranial abnormalities  to explain the patient's symptoms. Electronically Signed   By: Dorise Bullion III M.D   On: 11/16/2017 20:56   Dg Hip Unilat With Pelvis 2-3 Views Right  Result Date: 11/17/2017 CLINICAL DATA:  Pain following fall EXAM: DG HIP (WITH OR WITHOUT PELVIS) 2-3V RIGHT COMPARISON:  None. FINDINGS: Frontal pelvis as well as frontal and lateral right hip images were obtained. There is no fracture or dislocation. There is slight symmetric narrowing both hip joints. Sacroiliac joints appear normal. No erosive change. IMPRESSION: Slight symmetric narrowing of each hip joint. No fracture or dislocation. Electronically Signed   By: Lowella Grip III M.D.   On: 11/17/2017 10:34   Dg Femur, Min 2 Views Right  Result Date: 11/17/2017 CLINICAL DATA:  Pain following fall EXAM: RIGHT FEMUR 2 VIEWS  COMPARISON:  None. FINDINGS: Frontal and lateral views were obtained. There is no evident fracture or dislocation. Osteoarthritic changes noted in the right knee joint and to a lesser extent in the right hip joint. No erosive change. No knee joint effusion. IMPRESSION: Areas of osteoarthritic change.  No fracture or dislocation. Electronically Signed   By: Lowella Grip III M.D.   On: 11/17/2017 10:33   Ir Imaging Guided Port Insertion  Result Date: 12/06/2017 INDICATION: 57 year old female with a history of endometrial carcinoma. She has been referred for port catheter EXAM: IMPLANTED PORT A CATH PLACEMENT WITH ULTRASOUND AND FLUOROSCOPIC GUIDANCE MEDICATIONS: 2.0 g Ancef; The antibiotic was administered within an appropriate time interval prior to skin puncture. ANESTHESIA/SEDATION: Moderate (conscious) sedation was employed during this procedure. A total of Versed 4 mg and Fentanyl 100 mcg was administered intravenously. Moderate Sedation Time: 17 minutes. The patient's level of consciousness and vital signs were monitored continuously by radiology nursing throughout the procedure under my direct supervision. FLUOROSCOPY TIME:  0 minutes, 6 seconds (1.8 mGy) COMPLICATIONS: None PROCEDURE: The procedure, risks, benefits, and alternatives were explained to the patient. Questions regarding the procedure were encouraged and answered. The patient understands and consents to the procedure. Ultrasound survey was performed with images stored and sent to PACs. The right neck and chest was prepped with chlorhexidine, and draped in the usual sterile fashion using maximum barrier technique (cap and mask, sterile gown, sterile gloves, large sterile sheet, hand hygiene and cutaneous antiseptic). Antibiotic prophylaxis was provided with 2.0g Ancef administered IV one hour prior to skin incision. Local anesthesia was attained by infiltration with 1% lidocaine without epinephrine. Ultrasound demonstrated patency of the  right internal jugular vein, and this was documented with an image. Under real-time ultrasound guidance, this vein was accessed with a 21 gauge micropuncture needle and image documentation was performed. A small dermatotomy was made at the access site with an 11 scalpel. A 0.018" wire was advanced into the SVC and used to estimate the length of the internal catheter. The access needle exchanged for a 41F micropuncture vascular sheath. The 0.018" wire was then removed and a 0.035" wire advanced into the IVC. An appropriate location for the subcutaneous reservoir was selected below the clavicle and an incision was made through the skin and underlying soft tissues. The subcutaneous tissues were then dissected using a combination of blunt and sharp surgical technique and a pocket was formed. A single lumen power injectable portacatheter was then tunneled through the subcutaneous tissues from the pocket to the dermatotomy and the port reservoir placed within the subcutaneous pocket. The venous access site was then serially dilated and a peel away vascular sheath placed over the wire.  The wire was removed and the port catheter advanced into position under fluoroscopic guidance. The catheter tip is positioned in the cavoatrial junction. This was documented with a spot image. The portacatheter was then tested and found to flush and aspirate well. The port was flushed with saline followed by 100 units/mL heparinized saline. The pocket was then closed in two layers using first subdermal inverted interrupted absorbable sutures followed by a running subcuticular suture. The epidermis was then sealed with Dermabond. The dermatotomy at the venous access site was also seal with Dermabond. Patient tolerated the procedure well and remained hemodynamically stable throughout. No complications encountered and no significant blood loss encountered IMPRESSION: Status post right IJ port catheter placement. Catheter ready for use. Signed,  Dulcy Fanny. Dellia Nims, RPVI [facility] Electronically Signed   By: Corrie Mckusick D.O.   On: 12/06/2017 14:23    All questions were answered. The patient knows to call the clinic with any problems, questions or concerns. No barriers to learning was detected.  I spent 25 minutes counseling the patient face to face. The total time spent in the appointment was 30 minutes and more than 50% was on counseling and review of test results  Heath Lark, MD 12/11/2017 8:07 AM

## 2017-12-11 NOTE — Assessment & Plan Note (Signed)
We reviewed the NCCN guidelines We discussed the role of chemotherapy. The intent is of curative intent.  We discussed some of the risks, benefits, side-effects of carboplatin & Taxol. Treatment is intravenous, every 3 weeks x 6 cycles  Some of the short term side-effects included, though not limited to, including weight loss, life threatening infections, risk of allergic reactions, need for transfusions of blood products, nausea, vomiting, change in bowel habits, loss of hair, admission to hospital for various reasons, and risks of death.   Long term side-effects are also discussed including risks of infertility, permanent damage to nerve function, hearing loss, chronic fatigue, kidney damage with possibility needing hemodialysis, and rare secondary malignancy including bone marrow disorders.  The patient is aware that the response rates discussed earlier is not guaranteed.  After a long discussion, patient made an informed decision to proceed with the prescribed plan of care.   Patient education material was dispensed. We discussed premedication with dexamethasone before chemotherapy. Due to her young age, she does not need G-CSF support 

## 2017-12-11 NOTE — Assessment & Plan Note (Signed)
She has chronic bilateral lower extremity edema She will continue medical management

## 2017-12-11 NOTE — Assessment & Plan Note (Signed)
Her blood pressure remained chronically elevated I will consider medication adjustment if her blood pressure remains high chronically

## 2017-12-14 ENCOUNTER — Inpatient Hospital Stay: Payer: PRIVATE HEALTH INSURANCE

## 2017-12-14 ENCOUNTER — Encounter: Payer: Self-pay | Admitting: Hematology and Oncology

## 2017-12-14 VITALS — BP 117/69 | HR 87 | Temp 97.6°F | Resp 16

## 2017-12-14 DIAGNOSIS — Z5111 Encounter for antineoplastic chemotherapy: Secondary | ICD-10-CM | POA: Diagnosis not present

## 2017-12-14 DIAGNOSIS — C541 Malignant neoplasm of endometrium: Secondary | ICD-10-CM

## 2017-12-14 MED ORDER — FAMOTIDINE IN NACL 20-0.9 MG/50ML-% IV SOLN
20.0000 mg | Freq: Once | INTRAVENOUS | Status: AC
  Administered 2017-12-14: 20 mg via INTRAVENOUS

## 2017-12-14 MED ORDER — SODIUM CHLORIDE 0.9 % IV SOLN
690.0000 mg | Freq: Once | INTRAVENOUS | Status: AC
  Administered 2017-12-14: 690 mg via INTRAVENOUS
  Filled 2017-12-14: qty 69

## 2017-12-14 MED ORDER — HEPARIN SOD (PORK) LOCK FLUSH 100 UNIT/ML IV SOLN
500.0000 [IU] | Freq: Once | INTRAVENOUS | Status: AC | PRN
  Administered 2017-12-14: 500 [IU]
  Filled 2017-12-14: qty 5

## 2017-12-14 MED ORDER — SODIUM CHLORIDE 0.9% FLUSH
10.0000 mL | INTRAVENOUS | Status: DC | PRN
  Administered 2017-12-14: 10 mL
  Filled 2017-12-14: qty 10

## 2017-12-14 MED ORDER — DIPHENHYDRAMINE HCL 50 MG/ML IJ SOLN
INTRAMUSCULAR | Status: AC
Start: 2017-12-14 — End: ?
  Filled 2017-12-14: qty 1

## 2017-12-14 MED ORDER — PALONOSETRON HCL INJECTION 0.25 MG/5ML
INTRAVENOUS | Status: AC
  Filled 2017-12-14: qty 5

## 2017-12-14 MED ORDER — SODIUM CHLORIDE 0.9 % IV SOLN
Freq: Once | INTRAVENOUS | Status: AC
  Administered 2017-12-14: 13:00:00 via INTRAVENOUS

## 2017-12-14 MED ORDER — DIPHENHYDRAMINE HCL 50 MG/ML IJ SOLN
50.0000 mg | Freq: Once | INTRAMUSCULAR | Status: AC
Start: 2017-12-14 — End: 2017-12-14
  Administered 2017-12-14: 50 mg via INTRAVENOUS

## 2017-12-14 MED ORDER — FAMOTIDINE IN NACL 20-0.9 MG/50ML-% IV SOLN
INTRAVENOUS | Status: AC
  Filled 2017-12-14: qty 50

## 2017-12-14 MED ORDER — SODIUM CHLORIDE 0.9 % IV SOLN
Freq: Once | INTRAVENOUS | Status: AC
  Administered 2017-12-14: 13:00:00 via INTRAVENOUS
  Filled 2017-12-14: qty 5

## 2017-12-14 MED ORDER — PALONOSETRON HCL INJECTION 0.25 MG/5ML
0.2500 mg | Freq: Once | INTRAVENOUS | Status: AC
  Administered 2017-12-14: 0.25 mg via INTRAVENOUS

## 2017-12-14 MED ORDER — SODIUM CHLORIDE 0.9 % IV SOLN
140.0000 mg/m2 | Freq: Once | INTRAVENOUS | Status: AC
  Administered 2017-12-14: 318 mg via INTRAVENOUS
  Filled 2017-12-14: qty 53

## 2017-12-14 NOTE — Progress Notes (Signed)
Patient came in to sign grant approval.  Patient approved for one-time $400 [place]. Patient was given a copy of the approval letter as well as the expense sheet it covers along with the Outpatient pharmacy information. Patient very appreciative. Patient received a gas card today from her grant. She has my card for any additional financial questions or concerns.

## 2017-12-14 NOTE — Patient Instructions (Signed)
[facility] Discharge Instructions for Patients Receiving Chemotherapy  Today you received the following chemotherapy agents: Taxol & Carboplatin  To help prevent nausea and vomiting after your treatment, we encourage you to take your nausea medication as prescribed.  Do not take zofran for three days following your infusion, take compazine instead.   If you develop nausea and vomiting that is not controlled by your nausea medication, call the clinic.   BELOW ARE SYMPTOMS THAT SHOULD BE REPORTED IMMEDIATELY:  *FEVER GREATER THAN 100.5 F  *CHILLS WITH OR WITHOUT FEVER  NAUSEA AND VOMITING THAT IS NOT CONTROLLED WITH YOUR NAUSEA MEDICATION  *UNUSUAL SHORTNESS OF BREATH  *UNUSUAL BRUISING OR BLEEDING  TENDERNESS IN MOUTH AND THROAT WITH OR WITHOUT PRESENCE OF ULCERS  *URINARY PROBLEMS  *BOWEL PROBLEMS  UNUSUAL RASH Items with * indicate a potential emergency and should be followed up as soon as possible.  Feel free to call the clinic should you have any questions or concerns. The clinic phone number is [phone].  Please show the [place] at check-in to the Emergency Department and triage nurse.     Paclitaxel injection What is this medicine? PACLITAXEL (PAK li TAX el) is a chemotherapy drug. It targets fast dividing cells, like cancer cells, and causes these cells to die. This medicine is used to treat ovarian cancer, breast cancer, and other cancers. This medicine may be used for other purposes; ask your health care provider or pharmacist if you have questions. COMMON BRAND NAME(S): Onxol, Taxol What should I tell my health care provider before I take this medicine? They need to know if you have any of these conditions: -blood disorders -irregular heartbeat -infection (especially a virus infection such as chickenpox, cold sores, or herpes) -liver disease -previous or ongoing radiation therapy -an unusual or allergic reaction to paclitaxel,  alcohol, polyoxyethylated castor oil, other chemotherapy agents, other medicines, foods, dyes, or preservatives -pregnant or trying to get pregnant -breast-feeding How should I use this medicine? This drug is given as an infusion into a vein. It is administered in a hospital or clinic by a specially trained health care professional. Talk to your pediatrician regarding the use of this medicine in children. Special care may be needed. Overdosage: If you think you have taken too much of this medicine contact a poison control center or emergency room at once. NOTE: This medicine is only for you. Do not share this medicine with others. What if I miss a dose? It is important not to miss your dose. Call your doctor or health care professional if you are unable to keep an appointment. What may interact with this medicine? Do not take this medicine with any of the following medications: -disulfiram -metronidazole This medicine may also interact with the following medications: -cyclosporine -diazepam -ketoconazole -medicines to increase blood counts like filgrastim, pegfilgrastim, sargramostim -other chemotherapy drugs like cisplatin, doxorubicin, epirubicin, etoposide, teniposide, vincristine -quinidine -testosterone -vaccines -verapamil Talk to your doctor or health care professional before taking any of these medicines: -acetaminophen -aspirin -ibuprofen -ketoprofen -naproxen This list may not describe all possible interactions. Give your health care provider a list of all the medicines, herbs, non-prescription drugs, or dietary supplements you use. Also tell them if you smoke, drink alcohol, or use illegal drugs. Some items may interact with your medicine. What should I watch for while using this medicine? Your condition will be monitored carefully while you are receiving this medicine. You will need important blood work done while you are  taking this medicine. This medicine can cause  serious allergic reactions. To reduce your risk you will need to take other medicine(s) before treatment with this medicine. If you experience allergic reactions like skin rash, itching or hives, swelling of the face, lips, or tongue, tell your doctor or health care professional right away. In some cases, you may be given additional medicines to help with side effects. Follow all directions for their use. This drug may make you feel generally unwell. This is not uncommon, as chemotherapy can affect healthy cells as well as cancer cells. Report any side effects. Continue your course of treatment even though you feel ill unless your doctor tells you to stop. Call your doctor or health care professional for advice if you get a fever, chills or sore throat, or other symptoms of a cold or flu. Do not treat yourself. This drug decreases your body's ability to fight infections. Try to avoid being around people who are sick. This medicine may increase your risk to bruise or bleed. Call your doctor or health care professional if you notice any unusual bleeding. Be careful brushing and flossing your teeth or using a toothpick because you may get an infection or bleed more easily. If you have any dental work done, tell your dentist you are receiving this medicine. Avoid taking products that contain aspirin, acetaminophen, ibuprofen, naproxen, or ketoprofen unless instructed by your doctor. These medicines may hide a fever. Do not become pregnant while taking this medicine. Women should inform their doctor if they wish to become pregnant or think they might be pregnant. There is a potential for serious side effects to an unborn child. Talk to your health care professional or pharmacist for more information. Do not breast-feed an infant while taking this medicine. Men are advised not to father a child while receiving this medicine. This product may contain alcohol. Ask your pharmacist or healthcare provider if this  medicine contains alcohol. Be sure to tell all healthcare providers you are taking this medicine. Certain medicines, like metronidazole and disulfiram, can cause an unpleasant reaction when taken with alcohol. The reaction includes flushing, headache, nausea, vomiting, sweating, and increased thirst. The reaction can last from 30 minutes to several hours. What side effects may I notice from receiving this medicine? Side effects that you should report to your doctor or health care professional as soon as possible: -allergic reactions like skin rash, itching or hives, swelling of the face, lips, or tongue -low blood counts - This drug may decrease the number of white blood cells, red blood cells and platelets. You may be at increased risk for infections and bleeding. -signs of infection - fever or chills, cough, sore throat, pain or difficulty passing urine -signs of decreased platelets or bleeding - bruising, pinpoint red spots on the skin, black, tarry stools, nosebleeds -signs of decreased red blood cells - unusually weak or tired, fainting spells, lightheadedness -breathing problems -chest pain -high or low blood pressure -mouth sores -nausea and vomiting -pain, swelling, redness or irritation at the injection site -pain, tingling, numbness in the hands or feet -slow or irregular heartbeat -swelling of the ankle, feet, hands Side effects that usually do not require medical attention (report to your doctor or health care professional if they continue or are bothersome): -bone pain -complete hair loss including hair on your head, underarms, pubic hair, eyebrows, and eyelashes -changes in the color of fingernails -diarrhea -loosening of the fingernails -loss of appetite -muscle or joint pain -red flush to  skin -sweating This list may not describe all possible side effects. Call your doctor for medical advice about side effects. You may report side effects to FDA at 1-800-FDA-1088. Where  should I keep my medicine? This drug is given in a hospital or clinic and will not be stored at home. NOTE: This sheet is a summary. It may not cover all possible information. If you have questions about this medicine, talk to your doctor, pharmacist, or health care provider.  2018 [employer] Standard (2015-04-07 19:58:00)   Carboplatin injection What is this medicine? CARBOPLATIN (KAR boe pla tin) is a chemotherapy drug. It targets fast dividing cells, like cancer cells, and causes these cells to die. This medicine is used to treat ovarian cancer and many other cancers. This medicine may be used for other purposes; ask your health care provider or pharmacist if you have questions. COMMON BRAND NAME(S): Paraplatin What should I tell my health care provider before I take this medicine? They need to know if you have any of these conditions: -blood disorders -hearing problems -kidney disease -recent or ongoing radiation therapy -an unusual or allergic reaction to carboplatin, cisplatin, other chemotherapy, other medicines, foods, dyes, or preservatives -pregnant or trying to get pregnant -breast-feeding How should I use this medicine? This drug is usually given as an infusion into a vein. It is administered in a hospital or clinic by a specially trained health care professional. Talk to your pediatrician regarding the use of this medicine in children. Special care may be needed. Overdosage: If you think you have taken too much of this medicine contact a poison control center or emergency room at once. NOTE: This medicine is only for you. Do not share this medicine with others. What if I miss a dose? It is important not to miss a dose. Call your doctor or health care professional if you are unable to keep an appointment. What may interact with this medicine? -medicines for seizures -medicines to increase blood counts like filgrastim, pegfilgrastim, sargramostim -some antibiotics like  amikacin, gentamicin, neomycin, streptomycin, tobramycin -vaccines Talk to your doctor or health care professional before taking any of these medicines: -acetaminophen -aspirin -ibuprofen -ketoprofen -naproxen This list may not describe all possible interactions. Give your health care provider a list of all the medicines, herbs, non-prescription drugs, or dietary supplements you use. Also tell them if you smoke, drink alcohol, or use illegal drugs. Some items may interact with your medicine. What should I watch for while using this medicine? Your condition will be monitored carefully while you are receiving this medicine. You will need important blood work done while you are taking this medicine. This drug may make you feel generally unwell. This is not uncommon, as chemotherapy can affect healthy cells as well as cancer cells. Report any side effects. Continue your course of treatment even though you feel ill unless your doctor tells you to stop. In some cases, you may be given additional medicines to help with side effects. Follow all directions for their use. Call your doctor or health care professional for advice if you get a fever, chills or sore throat, or other symptoms of a cold or flu. Do not treat yourself. This drug decreases your body's ability to fight infections. Try to avoid being around people who are sick. This medicine may increase your risk to bruise or bleed. Call your doctor or health care professional if you notice any unusual bleeding. Be careful brushing and flossing your teeth or using a toothpick because you  may get an infection or bleed more easily. If you have any dental work done, tell your dentist you are receiving this medicine. Avoid taking products that contain aspirin, acetaminophen, ibuprofen, naproxen, or ketoprofen unless instructed by your doctor. These medicines may hide a fever. Do not become pregnant while taking this medicine. Women should inform their  doctor if they wish to become pregnant or think they might be pregnant. There is a potential for serious side effects to an unborn child. Talk to your health care professional or pharmacist for more information. Do not breast-feed an infant while taking this medicine. What side effects may I notice from receiving this medicine? Side effects that you should report to your doctor or health care professional as soon as possible: -allergic reactions like skin rash, itching or hives, swelling of the face, lips, or tongue -signs of infection - fever or chills, cough, sore throat, pain or difficulty passing urine -signs of decreased platelets or bleeding - bruising, pinpoint red spots on the skin, black, tarry stools, nosebleeds -signs of decreased red blood cells - unusually weak or tired, fainting spells, lightheadedness -breathing problems -changes in hearing -changes in vision -chest pain -high blood pressure -low blood counts - This drug may decrease the number of white blood cells, red blood cells and platelets. You may be at increased risk for infections and bleeding. -nausea and vomiting -pain, swelling, redness or irritation at the injection site -pain, tingling, numbness in the hands or feet -problems with balance, talking, walking -trouble passing urine or change in the amount of urine Side effects that usually do not require medical attention (report to your doctor or health care professional if they continue or are bothersome): -hair loss -loss of appetite -metallic taste in the mouth or changes in taste This list may not describe all possible side effects. Call your doctor for medical advice about side effects. You may report side effects to FDA at 1-800-FDA-1088. Where should I keep my medicine? This drug is given in a hospital or clinic and will not be stored at home. NOTE: This sheet is a summary. It may not cover all possible information. If you have questions about this medicine,  talk to your doctor, pharmacist, or health care provider.  2018 [employer]/Gold Standard (2007-09-11 14:38:05)

## 2017-12-18 ENCOUNTER — Telehealth: Payer: Self-pay | Admitting: Oncology

## 2017-12-18 NOTE — Telephone Encounter (Signed)
Called Angela Gardner to see how she is feeling after her first chemotherapy infusion and if she is ready to schedule her radiation treatments.  She said she is aching and is not sure if she can take ibuprofen or tylenol.  Advised her that she can take Tylenol and that the aches usually last for 3 days.  She verbalized agreement and said that she is ready to schedule her radiation treatments.

## 2017-12-22 ENCOUNTER — Encounter (HOSPITAL_COMMUNITY): Payer: Self-pay

## 2017-12-22 ENCOUNTER — Emergency Department (HOSPITAL_COMMUNITY): Payer: PRIVATE HEALTH INSURANCE

## 2017-12-22 ENCOUNTER — Telehealth: Payer: Self-pay

## 2017-12-22 ENCOUNTER — Emergency Department (HOSPITAL_COMMUNITY)
Admission: EM | Admit: 2017-12-22 | Discharge: 2017-12-22 | Disposition: A | Discharge status: Home / Self Care | Payer: PRIVATE HEALTH INSURANCE | Attending: Emergency Medicine | Admitting: Emergency Medicine

## 2017-12-22 ENCOUNTER — Telehealth: Payer: Self-pay | Admitting: *Deleted

## 2017-12-22 DIAGNOSIS — I10 Essential (primary) hypertension: Secondary | ICD-10-CM | POA: Insufficient documentation

## 2017-12-22 DIAGNOSIS — N898 Other specified noninflammatory disorders of vagina: Secondary | ICD-10-CM

## 2017-12-22 DIAGNOSIS — I509 Heart failure, unspecified: Secondary | ICD-10-CM | POA: Insufficient documentation

## 2017-12-22 DIAGNOSIS — Z90710 Acquired absence of both cervix and uterus: Secondary | ICD-10-CM | POA: Insufficient documentation

## 2017-12-22 DIAGNOSIS — Z8542 Personal history of malignant neoplasm of other parts of uterus: Secondary | ICD-10-CM | POA: Insufficient documentation

## 2017-12-22 DIAGNOSIS — Z9889 Other specified postprocedural states: Secondary | ICD-10-CM | POA: Insufficient documentation

## 2017-12-22 DIAGNOSIS — Z87891 Personal history of nicotine dependence: Secondary | ICD-10-CM | POA: Insufficient documentation

## 2017-12-22 DIAGNOSIS — Z79899 Other long term (current) drug therapy: Secondary | ICD-10-CM | POA: Insufficient documentation

## 2017-12-22 LAB — WET PREP, GENITAL
CLUE CELLS WET PREP: NONE SEEN
SPERM: NONE SEEN
TRICH WET PREP: NONE SEEN
Yeast Wet Prep HPF POC: NONE SEEN

## 2017-12-22 LAB — URINALYSIS, ROUTINE W REFLEX MICROSCOPIC
Bacteria, UA: NONE SEEN
Bilirubin Urine: NEGATIVE
Glucose, UA: NEGATIVE mg/dL
Hgb urine dipstick: NEGATIVE
KETONES UR: NEGATIVE mg/dL
Nitrite: NEGATIVE
PH: 5 (ref 5.0–8.0)
Protein, ur: 30 mg/dL — AB
SPECIFIC GRAVITY, URINE: 1.014 (ref 1.005–1.030)

## 2017-12-22 MED ORDER — SODIUM CHLORIDE 0.9 % IV BOLUS
500.0000 mL | Freq: Once | INTRAVENOUS | Status: AC
  Administered 2017-12-22: 500 mL via INTRAVENOUS

## 2017-12-22 MED ORDER — IOPAMIDOL (ISOVUE-300) INJECTION 61%
30.0000 mL | Freq: Once | INTRAVENOUS | Status: AC | PRN
  Administered 2017-12-22: 30 mL

## 2017-12-22 MED ORDER — IOPAMIDOL (ISOVUE-300) INJECTION 61%
INTRAVENOUS | Status: AC
  Filled 2017-12-22: qty 30

## 2017-12-22 MED ORDER — SODIUM CHLORIDE 0.9 % IV SOLN
INTRAVENOUS | Status: AC
  Filled 2017-12-22: qty 250

## 2017-12-22 MED ORDER — SODIUM CHLORIDE 0.9 % IV SOLN
INTRAVENOUS | Status: DC
  Administered 2017-12-22: 14:00:00 via INTRAVENOUS

## 2017-12-22 MED ORDER — PHENAZOPYRIDINE HCL 100 MG PO TABS
100.0000 mg | ORAL_TABLET | Freq: Three times a day (TID) | ORAL | Status: DC
  Administered 2017-12-22: 100 mg via ORAL
  Filled 2017-12-22: qty 1

## 2017-12-22 MED ORDER — IOPAMIDOL (ISOVUE-300) INJECTION 61%
100.0000 mL | Freq: Once | INTRAVENOUS | Status: AC | PRN
  Administered 2017-12-22: 100 mL via INTRAVENOUS

## 2017-12-22 MED ORDER — SODIUM CHLORIDE 0.9 % IV SOLN: INTRAVENOUS | Status: DC

## 2017-12-22 MED ORDER — IOPAMIDOL (ISOVUE-300) INJECTION 61%
INTRAVENOUS | Status: AC
  Filled 2017-12-22: qty 100

## 2017-12-22 NOTE — Discharge Instructions (Addendum)
Follow-up with Dr. Denman George as she has scheduled for you.  CT scan here today without any definitive answer about any into the vaginal area.  Follow-up with oncology as scheduled as well.  Return for any new or worse symptoms.

## 2017-12-22 NOTE — ED Triage Notes (Signed)
Pt states that she has discharge she believes is vaginal. States it is a thin clear discharge that is constantly pouring out of her. Pt has a hx of cancer. Reports taking tylenol for generalized pain.

## 2017-12-22 NOTE — Telephone Encounter (Signed)
CALLED PATIENT TO INFORM OF NEW HDR [facility], LVM FOR A RETURN CALL 

## 2017-12-22 NOTE — Telephone Encounter (Signed)
Spoke with Andrew Au ED nurse and gave him appointment for patient to f/u on Monday 12-25-17 at 2:15 pm with Dr. Denman George per Dr. Denman George. Spoke with patient directly as well and gave her appointment as well. Requested pt to arrive at 2 pm to check in.

## 2017-12-22 NOTE — ED Provider Notes (Signed)
[facility] DEPT Provider Note   CSN: [id number] Arrival date & time: 12/22/17  3149     History   Chief Complaint Chief Complaint  Patient presents with  . Vaginal Discharge    HPI Angela Gardner is a 57 y.o. female.  Patient has a history of endometrial carcinoma the uterus.  Underwent robotic controlled hysterectomy end of May.  This was done by Dr. Denman George.  Patient also followed by hematology oncology.  His started chemotherapy is being considered for radiation oncology.  Patient had first dose of chemotherapy a week ago.  She will have her next dose on July 18.  Patient presents today because starting yesterday she had kind of fluid clear fluid running out of her vagina on a pretty regular basis and this was new and unusual.  Patient still pain on her own her urine seem to be fine.  No significant abdominal pain.     Past Medical History:  Diagnosis Date  . Anemia   . 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 CHF (congestive heart failure) ([place])   . Dyspnea    still having this and not moving around alot -gets worse with exertion  . History of sleep walking   . Hx of left breast biopsy   . Hyperlipemia   . Hypertension    not on any medication now-[facility] had  prescribed her a med and she stopped taking them cannot remember when    Patient Active Problem List   Diagnosis Date Noted  . 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 Other constipation 11/28/2017  . Illicit drug use 70/26/3785  . Chronic kidney disease (CKD), stage III (moderate) ([place]) 10/30/2017  . Essential hypertension 10/27/2017  . Diastolic congestive heart failure, NYHA class 3 ([place]) 10/27/2017  . Endometrial cancer ([place]) 10/19/2017  . Morbid obesity ([place]) 10/09/2017  . Acute exacerbation of CHF (congestive heart failure) ([place]) 09/13/2017  . Hypertensive crisis 09/12/2017  . Mild renal insufficiency 09/12/2017  . Dysfunctional uterine bleeding 09/12/2017  . Acute CHF  (congestive heart failure) ([place]) 09/12/2017  . Chest discomfort 09/12/2017  . PMB (postmenopausal bleeding) 06/17/2015  . 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 OTITIS MEDIA 12/04/2008  . ACUTE BRONCHITIS 12/04/2008    Past Surgical History:  Procedure Laterality Date  . BREAST BIOPSY     left breast  . CESAREAN SECTION     one  . CHOLECYSTECTOMY    . DILATION AND CURETTAGE OF UTERUS     approx 1 year ago  . IR IMAGING GUIDED PORT INSERTION  12/06/2017  . LYMPH NODE BIOPSY N/A 11/14/2017   Procedure: SENTINEL LYMPH NODE BIOPSY;  Surgeon: Everitt Amber, MD;  Location: [facility];  Service: Gynecology;  Laterality: N/A;  . ROBOTIC ASSISTED TOTAL HYSTERECTOMY WITH BILATERAL SALPINGO OOPHERECTOMY N/A 11/14/2017   Procedure: XI ROBOTIC ASSISTED TOTAL HYSTERECTOMY WITH BILATERAL SALPINGO OOPHORECTOMY, RIGHT PELVIC LYMPHADENECTOMY;  Surgeon: Everitt Amber, MD;  Location: [facility];  Service: Gynecology;  Laterality: N/A;     OB History    Gravida  3   Para  1   Term  1   Preterm      AB  2   Living  1     SAB      TAB  2   Ectopic      Multiple      Live Births               Home Medications  Prior to Admission medications   Medication Sig Start Date End Date Taking? Authorizing Provider  amLODipine (NORVASC) 10 MG tablet Take 1 tablet (10 mg total) by mouth daily. 11/06/17 02/04/18 Yes Park Liter, MD  dexamethasone (DECADRON) 4 MG tablet Take 5 tabs at the night before and 5 tab the morning of chemotherapy, every 3 weeks, by mouth 12/08/17  Yes Gorsuch, Ni, MD  furosemide (LASIX) 40 MG tablet Take 1 tablet (40 mg total) by mouth daily. 09/16/17  Yes Rai, Ripudeep K, MD  hydrALAZINE (APRESOLINE) 10 MG tablet Take 1 tablet (10 mg total) by mouth 3 (three) times daily. 10/27/17  Yes Park Liter, MD  metoprolol tartrate (LOPRESSOR) 25 MG tablet Take 25 mg by mouth 2 (two) times daily.   Yes [provider]  lidocaine-prilocaine (EMLA) cream  Apply to affected area once Patient not taking: Reported on 12/08/2017 12/08/17   Heath Lark, MD  ondansetron (ZOFRAN) 8 MG tablet Take 1 tablet (8 mg total) by mouth every 8 (eight) hours as needed for refractory nausea / vomiting. Start on day 3 after chemo. Patient not taking: Reported on 12/08/2017 12/08/17   Heath Lark, MD  polyethylene glycol (MIRALAX / GLYCOLAX) packet Take 17 g by mouth daily as needed for mild constipation.     [provider]  prochlorperazine (COMPAZINE) 10 MG tablet Take 1 tablet (10 mg total) by mouth every 6 (six) hours as needed (Nausea or vomiting). Patient not taking: Reported on 12/08/2017 12/08/17   Heath Lark, MD  senna (SENOKOT) 8.6 MG TABS tablet Take 1 tablet by mouth daily.    [provider]    Family History Family History  Problem Relation Age of Onset  . Anuerysm Mother   . Congestive Heart Failure Mother   . Hypertension Father   . Hypertension Sister   . Hypertension Brother     Social History Social History   Tobacco Use  . Smoking status: Former Smoker    Packs/day: 0.30    Years: 26.00    Pack years: 7.80    Types: Cigarettes  . Smokeless tobacco: Never Used  . Tobacco comment: quit May 2019  Substance Use Topics  . Alcohol use: Yes    Comment: social drinking  . Drug use: Yes    Types: Cocaine    Comment: last time was one  to two weeks ago per patient      Allergies   Vicodin [hydrocodone-acetaminophen]   Review of Systems Review of Systems  Constitutional: Negative for fever.  HENT: Negative for congestion.   Eyes: Negative for visual disturbance.  Respiratory: Negative for shortness of breath.   Cardiovascular: Negative for chest pain.  Gastrointestinal: Negative for abdominal pain, nausea and vomiting.  Genitourinary: Positive for vaginal discharge. Negative for dysuria, pelvic pain and vaginal bleeding.  Musculoskeletal: Negative for back pain.  Skin: Negative for rash.  Neurological:  Negative for tremors and headaches.  Hematological: Does not bruise/bleed easily.  Psychiatric/Behavioral: Negative for confusion.     Physical Exam Updated Vital Signs BP (!) 162/91 (BP Location: Left Arm)   Pulse 68   Temp 98.1 F (36.7 C) (Oral)   Resp 17   Ht 1.626 m (5\' 4" )   Wt 106.6 kg (235 lb)   LMP  (LMP Unknown) Comment: vaginal bleeding continuously for last 8 months  SpO2 100%   BMI 40.34 kg/m   Physical Exam  Constitutional: She is oriented to person, place, and time. She appears well-developed and  well-nourished. No distress.  HENT:  Head: Normocephalic and atraumatic.  Mouth/Throat: Oropharynx is clear and moist.  Eyes: Pupils are equal, round, and reactive to light. Conjunctivae and EOM are normal.  Neck: Neck supple.  Cardiovascular: Normal rate, regular rhythm and normal heart sounds.  Pulmonary/Chest: Effort normal and breath sounds normal. No respiratory distress.  Abdominal: Soft. Bowel sounds are normal. There is no tenderness.  Genitourinary:  Genitourinary Comments: External genitalia is normal.  Vaginal vault with a clear collection of fluid somewhat suggestive of urine.  No significant bleeding.  Posterior part of the vaginal area seems to be well-healed.  No obvious fluid from me from the anterior part.  No tenderness on palpation.  Bimanual exam without any obvious abnormalities.  Musculoskeletal: Normal range of motion.  Neurological: She is alert and oriented to person, place, and time. No cranial nerve deficit or sensory deficit. She exhibits normal muscle tone. Coordination normal.  Skin: Skin is warm.  Nursing note and vitals reviewed.    ED Treatments / Results  Labs (all labs ordered are listed, but only abnormal results are displayed) Labs Reviewed  WET PREP, GENITAL - Abnormal; Notable for the following components:      Result Value   WBC, Wet Prep HPF POC FEW (*)    All other components within normal limits  URINALYSIS, ROUTINE W  REFLEX MICROSCOPIC - Abnormal; Notable for the following components:   Protein, ur 30 (*)    Leukocytes, UA TRACE (*)    All other components within normal limits  URINE CULTURE  GC/CHLAMYDIA PROBE AMP ([place]) NOT AT [facility]    EKG None  Radiology Ct Pelvis W Contrast  Result Date: 12/22/2017 CLINICAL DATA:  57 year old female with a history of endometrial cancer status post hysterectomy in May of 2019. Current complaint of clear thin nearly continuous discharge. Evaluate for fistula. EXAM: CT PELVIS WITH CONTRAST TECHNIQUE: Multidetector CT imaging of the pelvis was performed using the standard protocol following the bolus administration of intravenous contrast. CONTRAST:  153mL ISOVUE-300 IOPAMIDOL (ISOVUE-300) INJECTION 61%, 60mL ISOVUE-300 IOPAMIDOL (ISOVUE-300) INJECTION 61% COMPARISON:  Prior CT scan of the abdomen and pelvis 03/18/2009 FINDINGS: Urinary Tract: The visualized distal ureters are unremarkable in appearance. The bladder is well defined and well opacified. A Foley catheter is in place. A small amount of air within the bladder consistent with recent instrumentation. No evidence of extravasation of contrast into the surrounding soft tissues or into the adjacent vagina. Bowel: No evidence of obstruction or focal bowel wall thickening. Normal appendix in the right lower quadrant. The terminal ileum is unremarkable. Vascular/Lymphatic: No pathologically enlarged lymph nodes. No significant vascular abnormality seen. Reproductive: Surgical changes of prior hysterectomy. Small low-attenuation collection in the recess of [place] adjacent to the vaginal cuff measures 2.6 x 1.9 x 2.0 cm. Other:  None. Musculoskeletal: No acute osseous abnormality. No evidence of lytic or blastic bony lesion. IMPRESSION: 1. No convincing evidence of fistula between the lower urinary tract and vagina. 2. There is a small low-attenuation collection in the pelvic cul-de-sac adjacent to the vaginal cuff  measuring 2.6 x 1.9 x 2.0 cm (volume = 5.2 cm^3). This may represent a small amount of ascites or postoperative hematoma/seroma and could represent a potential source for vaginal discharge. Electronically Signed   By: Jacqulynn Cadet M.D.   On: 12/22/2017 14:59    Procedures Procedures (including critical care time)  Medications Ordered in ED Medications  phenazopyridine (PYRIDIUM) tablet 100 mg (100 mg Oral Given 12/22/17  1224)  0.9 %  sodium chloride infusion ( Intravenous New Bag/Given 12/22/17 1356)  iopamidol (ISOVUE-300) 61 % injection (has no administration in time range)  iopamidol (ISOVUE-300) 61 % injection (has no administration in time range)  sodium chloride 0.9 % infusion (has no administration in time range)  sodium chloride 0.9 % bolus 500 mL (0 mLs Intravenous Stopped 12/22/17 1426)  iopamidol (ISOVUE-300) 61 % injection 100 mL (100 mLs Intravenous Contrast Given 12/22/17 1417)  iopamidol (ISOVUE-300) 61 % injection 30 mL (30 mLs Other Contrast Given 12/22/17 1418)     Initial Impression / Assessment and Plan / ED Course  I have reviewed the triage vital signs and the nursing notes.  Pertinent labs & imaging results that were available during my care of the patient were reviewed by me and considered in my medical decision making (see chart for details).     Pelvic exam noncontributory other than the fluid in the vaginal vault is somewhat suggested that it could be urine.  I discussed with Dr. Denman George the patient's GYN oncologist.  She gave a couple options of where to go next.  She is to follow patient up in the office next week.  She suggested giving her Pyridium pill and see if the stuff coming out of the vaginal vault area turned orange.  In the meantime though she was suggested that we could do a CT scan with a urogram and so did talk to radiology may arrange that patient had CT pelvis with contrast and a urogram.  Results of that showed no distinct evidence of any leakage  from the bladder into the vaginal vault area.  It did mention that perhaps it could be like just seroma fluid leaking in that area.  But did not give a definitive answer.  Patient stable for discharge home and follow-up with Dr. Denman George.  Patient's wet prep without any significant findings.  Patient's urinalysis was negative no signs of urinary tract infection.  Patient will also continue to follow-up with oncology. Final Clinical Impressions(s) / ED Diagnoses   Final diagnoses:  Vaginal discharge    ED Discharge Orders    None       Fredia Sorrow, MD 12/22/17 1736

## 2017-12-23 LAB — URINE CULTURE: CULTURE: NO GROWTH

## 2017-12-25 ENCOUNTER — Inpatient Hospital Stay: Payer: PRIVATE HEALTH INSURANCE | Attending: Gynecologic Oncology | Admitting: Gynecologic Oncology

## 2017-12-25 ENCOUNTER — Encounter: Payer: Self-pay | Admitting: Gynecologic Oncology

## 2017-12-25 VITALS — BP 135/72 | HR 56 | Temp 98.6°F | Resp 20 | Ht 64.0 in | Wt 241.0 lb

## 2017-12-25 DIAGNOSIS — C541 Malignant neoplasm of endometrium: Secondary | ICD-10-CM | POA: Insufficient documentation

## 2017-12-25 DIAGNOSIS — Z90710 Acquired absence of both cervix and uterus: Secondary | ICD-10-CM | POA: Diagnosis not present

## 2017-12-25 DIAGNOSIS — Z90722 Acquired absence of ovaries, bilateral: Secondary | ICD-10-CM | POA: Diagnosis not present

## 2017-12-25 DIAGNOSIS — Z9221 Personal history of antineoplastic chemotherapy: Secondary | ICD-10-CM | POA: Diagnosis not present

## 2017-12-25 LAB — GC/CHLAMYDIA PROBE AMP (~~LOC~~) NOT AT ARMC
CHLAMYDIA, DNA PROBE: NEGATIVE
NEISSERIA GONORRHEA: NEGATIVE

## 2017-12-25 NOTE — Patient Instructions (Signed)
Dr Denman George will set you up to see one of the urology surgeons from [facility] to be evaluated for a possible leak from the bladder or ureters.

## 2017-12-25 NOTE — Progress Notes (Signed)
Follow-up Note: Gyn-Onc  Consult was requested by Dr. Hulan Fray for the evaluation of Angela Gardner 57 y.o. female  CC:  Chief Complaint  Patient presents with  . Endometrial cancer [facility])    Assessment/Plan:  Angela Gardner  is a 57 y.o.  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 High risk factors for recurrence given the unfavorable cell type. Recommendation was for 6 cycles of carboplatin and paclitaxel chemotherapy and vaginal brachytherapy to reduce risk for local recurrence in accordance with NCCN guidelines. S/p 1 cycle of carb/taxol on 12/14/17.  Now symptoms concerning for GU vistula with vagina. However, negative CT scan. Pyridium test positive (vaginal fluid stained orange per patient).  Given the mixed clinical picture (negative imaging but positive pyridium test) I feel that she would best be served with a consultation with a urologist. We will refer her to [facility] for possible cystoscopy/retrograde pyelogram.   HPI: Angela Gardner is a 57 year old P1 who is seen in consultation at the request of Dr Hulan Fray for grade 3 endometrial cancer.   The patient transitioned through menopause prior to age 4.  She had an episode of postmenopausal bleeding in approximately 2017 at which time she had a D&C procedure revealing benign pathology.  She stopped vaginal bleeding until approximately November 2018 when she began having heavy menstrual-like bleeding and was started on Megace.  This helped some however bleeding persisted and therefore she underwent a transvaginal ultrasound scan on 09/15/2017 this revealed a uterus measuring 11.2 x 5 x 6.9 cm with a subserosal fundal fibroid measuring up to 3.8 cm.  The endometrial thickness was 7 mm with concern for an echogenic mass centrally within the endometrium measuring 3.8 x 3.8 x 3.6 cm.  She then underwent an endometrial Pip[place] biopsy on 10/09/2017 which revealed high-grade adenocarcinoma.  Of  interest her Pap at the same time on 10/09/2017 revealed normal cytology however high risk HPV was detected.  Due to the finding of a high-grade endometrial cancer that she underwent a CT scan of the chest abdomen and pelvis on September 12, 2017 this revealed grossly unremarkable uterus and ovaries, no lymphadenopathy, no gross evidence of extrauterine disease or metastases.  The patient otherwise is obese with hypertension and hypercholesterolemia.  She has had one prior pregnancy which resulted in a cesarean delivery.  She has had an open cholecystectomy.  On 11/14/17 she underwent robotically assisted total hysterectomy, BSO, SLN biopsy. Postoperatively her recovery was complicated by dizziness, dehydration and azotemia requiring an additional night's stay in the hospital. This resolved with hydration. Preop urine tox screen was positive for cocaine. .  Final pathology revealed a 2.3cm high grade serous endometrial cancer with sarcomatoid features. The carcinoma invades 0.5cm of 3cm in myometrium. Lymph nodes (left sentinel, right pelvic) were negative. No LVSI was present. No cervical or adnexal involvement. She was determined to have stage IA high grade serous carcinoma of the uterus and was recommended to receive adjuvant therapy in the form of 6 cycles of carboplatin and paclitaxel and vaginal brachytherapy in accordance with NCCN guidelines.   Interval Hx:  On 12/21/17 the patient began experiencing leakage of clear fluid from the vagina. She was seen in the [facility] ER on 12/22/17 and a CT urogram with retrograde cystogram was performed. It showed no extravasation of contrast around the kidneys, from the ureters or around the vagina. There was a 2cm collection of fluid adjacent to the vagina. No definitive fistula/leak was  identified. She was also given oral pyridium in the ED. The patient reports that this changed the color of her urine to dark orange, and reports that it also changed the color of  the fluid from her vagina to dark orange.  Current Meds:  Outpatient Encounter Medications as of 12/25/2017  Medication Sig  . amLODipine (NORVASC) 10 MG tablet Take 1 tablet (10 mg total) by mouth daily.  Marland Kitchen dexamethasone (DECADRON) 4 MG tablet Take 5 tabs at the night before and 5 tab the morning of chemotherapy, every 3 weeks, by mouth  . furosemide (LASIX) 40 MG tablet Take 1 tablet (40 mg total) by mouth daily.  . hydrALAZINE (APRESOLINE) 10 MG tablet Take 1 tablet (10 mg total) by mouth 3 (three) times daily.  Marland Kitchen lidocaine-prilocaine (EMLA) cream Apply to affected area once  . metoprolol tartrate (LOPRESSOR) 25 MG tablet Take 25 mg by mouth 2 (two) times daily.  . ondansetron (ZOFRAN) 8 MG tablet Take 1 tablet (8 mg total) by mouth every 8 (eight) hours as needed for refractory nausea / vomiting. Start on day 3 after chemo.  . polyethylene glycol (MIRALAX / GLYCOLAX) packet Take 17 g by mouth daily as needed for mild constipation.   . prochlorperazine (COMPAZINE) 10 MG tablet Take 1 tablet (10 mg total) by mouth every 6 (six) hours as needed (Nausea or vomiting).  Marland Kitchen senna (SENOKOT) 8.6 MG TABS tablet Take 1 tablet by mouth daily.   No facility-administered encounter medications on file as of 12/25/2017.     Allergy:  Allergies  Allergen Reactions  . Vicodin [Hydrocodone-Acetaminophen] Itching    Social Hx:   Social History   Socioeconomic History  . Marital status: Single    Spouse name: Not on file  . Number of children: 1  . Years of education: Not on file  . Highest education level: Not on file  Occupational History  . Not on file  Social Needs  . Financial resource strain: Not on file  . Food insecurity:    Worry: Not on file    Inability: Not on file  . Transportation needs:    Medical: Not on file    Non-medical: Not on file  Tobacco Use  . Smoking status: Former Smoker    Packs/day: 0.30    Years: 26.00    Pack years: 7.80    Types: Cigarettes  . Smokeless  tobacco: Never Used  . Tobacco comment: quit May 2019  Substance and Sexual Activity  . Alcohol use: Yes    Comment: social drinking  . Drug use: Yes    Types: Cocaine    Comment: last time was one  to two weeks ago per patient   . Sexual activity: Not Currently    Birth control/protection: Post-menopausal  Lifestyle  . Physical activity:    Days per week: Not on file    Minutes per session: Not on file  . Stress: Not on file  Relationships  . Social connections:    Talks on phone: Not on file    Gets together: Not on file    Attends religious service: Not on file    Active member of club or organization: Not on file    Attends meetings of clubs or organizations: Not on file    Relationship status: Not on file  . Intimate partner violence:    Fear of current or ex partner: Not on file    Emotionally abused: Not on file    Physically abused:  Not on file    Forced sexual activity: Not on file  Other Topics Concern  . Not on file  Social History Narrative  . Not on file    Past Surgical Hx:  Past Surgical History:  Procedure Laterality Date  . BREAST BIOPSY     left breast  . CESAREAN SECTION     one  . CHOLECYSTECTOMY    . DILATION AND CURETTAGE OF UTERUS     approx 1 year ago  . IR IMAGING GUIDED PORT INSERTION  12/06/2017  . LYMPH NODE BIOPSY N/A 11/14/2017   Procedure: SENTINEL LYMPH NODE BIOPSY;  Surgeon: Everitt Amber, MD;  Location: [facility];  Service: Gynecology;  Laterality: N/A;  . ROBOTIC ASSISTED TOTAL HYSTERECTOMY WITH BILATERAL SALPINGO OOPHERECTOMY N/A 11/14/2017   Procedure: XI ROBOTIC ASSISTED TOTAL HYSTERECTOMY WITH BILATERAL SALPINGO OOPHORECTOMY, RIGHT PELVIC LYMPHADENECTOMY;  Surgeon: Everitt Amber, MD;  Location: [facility];  Service: Gynecology;  Laterality: N/A;    Past Medical Hx:  Past Medical History:  Diagnosis Date  . Anemia   . Cancer ([place])    left ductal papilloma  . CHF (congestive heart failure) ([place])   . Dyspnea    still having this and not  moving around alot -gets worse with exertion  . History of sleep walking   . Hx of left breast biopsy   . Hyperlipemia   . Hypertension    not on any medication now-[facility] had  prescribed her a med and she stopped taking them cannot remember when    Past Gynecological History:  C/s x 1, positive high risk HPV in 2019. No LMP recorded (lmp unknown). Patient has had a hysterectomy.  Family Hx:  Family History  Problem Relation Age of Onset  . Anuerysm Mother   . Congestive Heart Failure Mother   . Hypertension Father   . Hypertension Sister   . Hypertension Brother     Review of Systems:  Constitutional  Feels well,    ENT Normal appearing ears and nares bilaterally Skin/Breast  No rash, sores, jaundice, itching, dryness Cardiovascular  No chest pain, shortness of breath, or edema  Pulmonary  No cough or wheeze.  Gastro Intestinal  No nausea, vomitting, or diarrhoea. No bright red blood per rectum, no abdominal pain, change in bowel movement, or constipation.  Genito Urinary  No frequency, urgency, dysuria, + leakage of clear fluid.    Musculo Skeletal  No myalgia, arthralgia, joint swelling or pain  Neurologic  No weakness, numbness, change in gait,  Psychology  No depression, anxiety, insomnia.   Vitals:  Blood pressure 135/72, pulse (!) 56, temperature 98.6 F (37 C), temperature source Oral, resp. rate 20, height 5\' 4"  (1.626 m), weight 241 lb (109.3 kg), SpO2 100 %.  Physical Exam: WD in NAD Neck  Supple NROM, without any enlargements.  Lymph Node Survey No cervical supraclavicular or inguinal adenopathy Cardiovascular  Pulse normal rate, regularity and rhythm. S1 and S2 normal.  Lungs  Clear to auscultation bilateraly, without wheezes/crackles/rhonchi. Good air movement.  Skin  No rash/lesions/breakdown  Psychiatry  Alert and oriented to person, place, and time  Abdomen  Normoactive bowel sounds, abdomen soft, non-tender and obese without evidence  of hernia. Incisions are well healed Back No CVA tenderness Genito Urinary  Vaginal cuff with 2cm area of visible friable vaginal cuff on left (suture material still present). Scant amount of clear fluid in posterior vagina. Rectal  deferred Extremities  No bilateral cyanosis, clubbing or edema.   Terrence Dupont  Pamella Pert, MD  12/25/2017, 2:56 PM

## 2017-12-26 ENCOUNTER — Telehealth: Payer: Self-pay | Admitting: Oncology

## 2017-12-26 NOTE — Telephone Encounter (Signed)
Called and spoke to [place] in triage at [facility].  She said Dr. Carlton Adam nurse is working on Oncologist an appointment for tomorrow.

## 2017-12-28 ENCOUNTER — Telehealth: Payer: Self-pay | Admitting: Oncology

## 2017-12-28 NOTE — Telephone Encounter (Signed)
[facility] and patient has an appointment on 01/01/18.

## 2018-01-01 ENCOUNTER — Telehealth: Payer: Self-pay | Admitting: Oncology

## 2018-01-01 ENCOUNTER — Telehealth: Payer: Self-pay | Admitting: *Deleted

## 2018-01-01 NOTE — Telephone Encounter (Signed)
CALLED PATIENT TO REMIND OF HDR Glandorf FOR 01-02-18, LVM FOR A RETURN CALL

## 2018-01-01 NOTE — Telephone Encounter (Signed)
Received a call from Angela Gardner's sister, Angela Gardner, that patient went to her urology appointment and was told that her insurance was not accepted.  [facility] and they said that patient's insurance, [employer], only covers hospital visits and that patient would need to pay out of pocket for the visit which patient refused.

## 2018-01-01 NOTE — Telephone Encounter (Addendum)
Called Angela Gardner and advised her that her appointments with Dr. Sondra Come are canceled for tomorrow, 01/02/18 (8am nurse eval, 8:30 follow up, 9am Ct Sim, HDR Treatment).  Advised her that we will call back to reschedule after we receive the notes from her urology appointment.  She verbalized understanding and agreement.

## 2018-01-02 ENCOUNTER — Ambulatory Visit: Payer: PRIVATE HEALTH INSURANCE | Admitting: Radiation Oncology

## 2018-01-02 ENCOUNTER — Ambulatory Visit: Payer: PRIVATE HEALTH INSURANCE

## 2018-01-02 ENCOUNTER — Telehealth: Payer: Self-pay | Admitting: Oncology

## 2018-01-02 NOTE — Telephone Encounter (Signed)
Faxed received from [facility] at [facility] stating that patient was a no show for her appointment yesterday.  Called [name] and advised her that Angela Gardner did come to her appointment but her insurance was not accepted.  She would have to pay out of pocket.  Advised her that the appointment should not be marked as a no show. [name] verbalized understanding.

## 2018-01-03 ENCOUNTER — Telehealth: Payer: Self-pay | Admitting: Oncology

## 2018-01-03 ENCOUNTER — Telehealth: Payer: Self-pay | Admitting: *Deleted

## 2018-01-03 NOTE — Telephone Encounter (Signed)
Called Angela Gardner and asked if she is still having vaginal drainage.  She said she only has a little bit "every now and then."  She said it last happened a couple of days ago.  Asked if she thinks she needs to go to see the urologist and she said that she doesn't think so.

## 2018-01-03 NOTE — Telephone Encounter (Signed)
CALLED PATIENT TO INFORM OF NEW HDR [facility], SPOKE WITH PATIENT AND SHE IS AWARE OF THESE APPTS. °

## 2018-01-03 NOTE — Telephone Encounter (Addendum)
Dr. Denman George advised of note below. Per Dr. Denman George, hold off on urology referral for now and reschedule radiation appointment. Called patient and let her know and advised her to call back if she starts having vaginal drainage again.  She verbalized understanding and agreement.

## 2018-01-04 ENCOUNTER — Inpatient Hospital Stay: Payer: PRIVATE HEALTH INSURANCE

## 2018-01-04 ENCOUNTER — Encounter: Payer: Self-pay | Admitting: Hematology and Oncology

## 2018-01-04 ENCOUNTER — Inpatient Hospital Stay (HOSPITAL_BASED_OUTPATIENT_CLINIC_OR_DEPARTMENT_OTHER): Payer: PRIVATE HEALTH INSURANCE | Admitting: Hematology and Oncology

## 2018-01-04 ENCOUNTER — Ambulatory Visit: Payer: PRIVATE HEALTH INSURANCE | Admitting: Radiation Oncology

## 2018-01-04 ENCOUNTER — Other Ambulatory Visit: Payer: PRIVATE HEALTH INSURANCE

## 2018-01-04 ENCOUNTER — Telehealth: Payer: Self-pay | Admitting: Hematology and Oncology

## 2018-01-04 DIAGNOSIS — C541 Malignant neoplasm of endometrium: Secondary | ICD-10-CM

## 2018-01-04 DIAGNOSIS — G620 Drug-induced polyneuropathy: Secondary | ICD-10-CM

## 2018-01-04 DIAGNOSIS — R52 Pain, unspecified: Secondary | ICD-10-CM

## 2018-01-04 DIAGNOSIS — T451X5A Adverse effect of antineoplastic and immunosuppressive drugs, initial encounter: Secondary | ICD-10-CM

## 2018-01-04 DIAGNOSIS — Z79899 Other long term (current) drug therapy: Secondary | ICD-10-CM

## 2018-01-04 DIAGNOSIS — N183 Chronic kidney disease, stage 3 unspecified: Secondary | ICD-10-CM

## 2018-01-04 DIAGNOSIS — T451X5S Adverse effect of antineoplastic and immunosuppressive drugs, sequela: Secondary | ICD-10-CM

## 2018-01-04 DIAGNOSIS — Z9221 Personal history of antineoplastic chemotherapy: Secondary | ICD-10-CM

## 2018-01-04 DIAGNOSIS — Z90710 Acquired absence of both cervix and uterus: Secondary | ICD-10-CM

## 2018-01-04 DIAGNOSIS — Z90722 Acquired absence of ovaries, bilateral: Secondary | ICD-10-CM

## 2018-01-04 DIAGNOSIS — I5032 Chronic diastolic (congestive) heart failure: Secondary | ICD-10-CM

## 2018-01-04 HISTORY — DX: Adverse effect of antineoplastic and immunosuppressive drugs, initial encounter: G62.0

## 2018-01-04 HISTORY — DX: Pain, unspecified: R52

## 2018-01-04 HISTORY — DX: Drug-induced polyneuropathy: T45.1X5A

## 2018-01-04 LAB — CMP (CANCER CENTER ONLY)
ALT: 25 U/L (ref 0–44)
ANION GAP: 8 (ref 5–15)
AST: 17 U/L (ref 15–41)
Albumin: 4.1 g/dL (ref 3.5–5.0)
Alkaline Phosphatase: 131 U/L — ABNORMAL HIGH (ref 38–126)
BUN: 23 mg/dL — ABNORMAL HIGH (ref 6–20)
CO2: 23 mmol/L (ref 22–32)
Calcium: 10.3 mg/dL (ref 8.9–10.3)
Chloride: 106 mmol/L (ref 98–111)
Creatinine: 1.23 mg/dL — ABNORMAL HIGH (ref 0.44–1.00)
GFR, EST AFRICAN AMERICAN: 56 mL/min — AB (ref >60)
GFR, Estimated: 48 mL/min — ABNORMAL LOW (ref >60)
Glucose, Bld: 144 mg/dL — ABNORMAL HIGH (ref 70–99)
POTASSIUM: 4.4 mmol/L (ref 3.5–5.1)
SODIUM: 137 mmol/L (ref 135–145)
Total Bilirubin: 0.3 mg/dL (ref 0.3–1.2)
Total Protein: 8.3 g/dL — ABNORMAL HIGH (ref 6.5–8.1)

## 2018-01-04 LAB — CBC WITH DIFFERENTIAL (CANCER CENTER ONLY)
Basophils Absolute: 0 10*3/uL (ref 0.0–0.1)
Basophils Relative: 0 %
Eosinophils Absolute: 0 10*3/uL (ref 0.0–0.5)
Eosinophils Relative: 0 %
HEMATOCRIT: 35.3 % (ref 34.8–46.6)
HEMOGLOBIN: 11.8 g/dL (ref 11.6–15.9)
LYMPHS ABS: 2 10*3/uL (ref 0.9–3.3)
LYMPHS PCT: 20 %
MCH: 29.4 pg (ref 25.1–34.0)
MCHC: 33.4 g/dL (ref 31.5–36.0)
MCV: 87.8 fL (ref 79.5–101.0)
MONOS PCT: 2 %
Monocytes Absolute: 0.2 10*3/uL (ref 0.1–0.9)
NEUTROS ABS: 8.1 10*3/uL — AB (ref 1.5–6.5)
NEUTROS PCT: 78 %
Platelet Count: 327 10*3/uL (ref 145–400)
RBC: 4.02 MIL/uL (ref 3.70–5.45)
RDW: 14.9 % — AB (ref 11.2–14.5)
WBC Count: 10.3 10*3/uL (ref 3.9–10.3)

## 2018-01-04 MED ORDER — DIPHENHYDRAMINE HCL 50 MG/ML IJ SOLN
INTRAMUSCULAR | Status: AC
  Filled 2018-01-04: qty 1

## 2018-01-04 MED ORDER — FAMOTIDINE IN NACL 20-0.9 MG/50ML-% IV SOLN
20.0000 mg | Freq: Once | INTRAVENOUS | Status: AC
  Administered 2018-01-04: 20 mg via INTRAVENOUS

## 2018-01-04 MED ORDER — HEPARIN SOD (PORK) LOCK FLUSH 100 UNIT/ML IV SOLN
500.0000 [IU] | Freq: Once | INTRAVENOUS | Status: AC | PRN
  Administered 2018-01-04: 500 [IU]
  Filled 2018-01-04: qty 5

## 2018-01-04 MED ORDER — HYDRALAZINE HCL 10 MG PO TABS: 10.0000 mg | ORAL_TABLET | Freq: Three times a day (TID) | ORAL | 3 refills | Status: DC

## 2018-01-04 MED ORDER — SODIUM CHLORIDE 0.9% FLUSH
10.0000 mL | INTRAVENOUS | Status: DC | PRN
  Administered 2018-01-04: 10 mL
  Filled 2018-01-04: qty 10

## 2018-01-04 MED ORDER — SODIUM CHLORIDE 0.9 % IV SOLN
667.2000 mg | Freq: Once | INTRAVENOUS | Status: AC
  Administered 2018-01-04: 670 mg via INTRAVENOUS
  Filled 2018-01-04: qty 67

## 2018-01-04 MED ORDER — DIPHENHYDRAMINE HCL 50 MG/ML IJ SOLN
50.0000 mg | Freq: Once | INTRAMUSCULAR | Status: AC
Start: 2018-01-04 — End: 2018-01-04
  Administered 2018-01-04: 50 mg via INTRAVENOUS

## 2018-01-04 MED ORDER — PALONOSETRON HCL INJECTION 0.25 MG/5ML
0.2500 mg | Freq: Once | INTRAVENOUS | Status: AC
  Administered 2018-01-04: 0.25 mg via INTRAVENOUS

## 2018-01-04 MED ORDER — SODIUM CHLORIDE 0.9 % IV SOLN
116.6667 mg/m2 | Freq: Once | INTRAVENOUS | Status: AC
  Administered 2018-01-04: 264 mg via INTRAVENOUS
  Filled 2018-01-04: qty 44

## 2018-01-04 MED ORDER — HYDRALAZINE HCL 10 MG PO TABS: 10.0000 mg | ORAL_TABLET | Freq: Three times a day (TID) | ORAL | 0 refills | Status: DC

## 2018-01-04 MED ORDER — PALONOSETRON HCL INJECTION 0.25 MG/5ML
INTRAVENOUS | Status: AC
Start: 2018-01-04 — End: ?
  Filled 2018-01-04: qty 5

## 2018-01-04 MED ORDER — FUROSEMIDE 40 MG PO TABS: 40.0000 mg | ORAL_TABLET | Freq: Every day | ORAL | 0 refills | Status: DC

## 2018-01-04 MED ORDER — FAMOTIDINE IN NACL 20-0.9 MG/50ML-% IV SOLN
INTRAVENOUS | Status: AC
  Filled 2018-01-04: qty 50

## 2018-01-04 MED ORDER — SODIUM CHLORIDE 0.9 % IV SOLN
Freq: Once | INTRAVENOUS | Status: AC
  Administered 2018-01-04: 13:00:00 via INTRAVENOUS
  Filled 2018-01-04: qty 5

## 2018-01-04 MED ORDER — METOPROLOL TARTRATE 25 MG PO TABS: 25.0000 mg | ORAL_TABLET | Freq: Two times a day (BID) | ORAL | 0 refills | Status: DC

## 2018-01-04 MED ORDER — SODIUM CHLORIDE 0.9 % IV SOLN
Freq: Once | INTRAVENOUS | Status: AC
  Administered 2018-01-04: 12:00:00 via INTRAVENOUS

## 2018-01-04 MED ORDER — SODIUM CHLORIDE 0.9% FLUSH
10.0000 mL | Freq: Once | INTRAVENOUS | Status: AC
  Administered 2018-01-04: 10 mL
  Filled 2018-01-04: qty 10

## 2018-01-04 MED FILL — METOPROLOL TARTRATE 25 MG T: 25 | 30 days supply | Qty: 60 | Fill #0

## 2018-01-04 MED FILL — FUROSEMIDE 40 MG TAB: 40 | 30 days supply | Qty: 30 | Fill #0

## 2018-01-04 MED FILL — hydrALAZINE HCL 10 MG TABS: 10 | 30 days supply | Qty: 90 | Fill #0

## 2018-01-04 NOTE — Assessment & Plan Note (Signed)
Her kidney function is stable We will adjust chemotherapy accordingly

## 2018-01-04 NOTE — Assessment & Plan Note (Signed)
She has recent diffuse pain secondary to side effects of Taxol I plan further dose reduction Due to history of illicit drug use, I will not prescribe narcotic prescriptions

## 2018-01-04 NOTE — Assessment & Plan Note (Signed)
I will proceed to reduce a dose of Taxol

## 2018-01-04 NOTE — Assessment & Plan Note (Signed)
She tolerated cycle 1 of treatment poorly due to diffuse bone pain which I think is exacerbated by Taxol I plan further dose reduce Taxol I will see her back next cycle for further toxicity review

## 2018-01-04 NOTE — Progress Notes (Signed)
[place] OFFICE PROGRESS NOTE  Patient Care Team: Charlott Rakes, MD as PCP - General (Family Medicine)  ASSESSMENT & PLAN:  Endometrial cancer [facility]) She tolerated cycle 1 of treatment poorly due to diffuse bone pain which I think is exacerbated by Taxol I plan further dose reduce Taxol I will see her back next cycle for further toxicity review  Diastolic congestive heart failure, NYHA class 3 ([place]) The patient has significant fluid retention and diffuse edema She has run out almost all her blood pressure medication and diuretic We discussed importance of close follow-up with primary care doctor and cardiologist I refill multiple prescriptions to our pharmacy for payment assistance  Chronic kidney disease (CKD), stage III (moderate) (HCC) Her kidney function is stable We will adjust chemotherapy accordingly  Diffuse pain She has recent diffuse pain secondary to side effects of Taxol I plan further dose reduction Due to history of illicit drug use, I will not prescribe narcotic prescriptions  Peripheral neuropathy due to chemotherapy ([place]) I will proceed to reduce a dose of Taxol   No orders of the defined types were placed in this encounter.   INTERVAL HISTORY: Please see below for problem oriented charting. 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 Severe endometrial  thickening at 35.2 mm. Large echogenic focus noted within the endometrial canal measuring 4.7 x 1.7 x 3.0 cm.  Endometrial fluid noted. These findings are suspicious for endometrial carcinoma .  2. Associated uterine enlargement. Two probable small fibroids are noted along the posterior fundus. The largest measures 3.7 cm.  3.  Ovaries not visualized.      05/21/2015 Initial Diagnosis    She presented with postmenopausal bleeding      06/15/2015 Pathology Results    Endometrium, biopsy - FRAGMENTED INACTIVE ENDOMETRIUM. - NO ATYPIA OR MALIGNANCY      09/13/2017 Imaging    Normal LV size with severe LV hypertrophy. EF 55%. Moderate diastolic dysfunction. Normal RV size and systolic function. No significant valvular abnormalities.      09/13/2017 Imaging    1. No acute intrathoracic, abdominal, or pelvic pathology. No aortic aneurysm or dissection. No CT evidence of pulmonary embolus. 2. Cardiomegaly with left ventricular hypertrophy      09/15/2017 Imaging    US pelvis Concern for large echogenic mass within the endometrium measuring 3.8 x 3.8 x 3.6 cm. Recommend further evaluation with sonohysterogram or hysteroscopy/biopsy.  3.8 cm fundal subserosal fibroid.       10/09/2017 Pathology Results    Endometrium, biopsy - HIGH GRADE ADENOCARCINOMA. - SEE MICROSCOPIC DESCRIPTION. Microscopic Comment There is abundant secretory type endometrium and scattered fragments of high grade adenocarcinoma, which focally has features suggestive of high grade serous carcinoma. There is also a microscopic focus with high grade carcinoma and associated spindled cell component. In this small focus a sarcomatous component cannot be ruled out; however, carcinoma  with sarcomatoid features is favored.      11/14/2017 Pathology Results    1. Lymph node, sentinel, biopsy, left external iliac - ONE BENIGN LYMPH NODE (0/1). 2. Lymph nodes, regional resection, right pelvic - FIVE BENIGN LYMPH NODES  (0/5). 3. 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 - CARCINOMA INVADES 0.5 CM IN A 3 CM MYOMETRIUM. - CERVIX, BILATERAL OVARIES AND BILATERAL FALLOPIAN TUBES FREE OF TUMOR. CERVIX: - NABOTHIAN CYSTS. - NO DYSPLASIA OR MALIGNANCY. MYOMETRIUM: - ADENOMYOSIS. - LEIOMYOMATA. RIGHT AND LEFT OVARIES: - UNREMARKABLE. - NO ENDOMETRIOSIS OR MALIGNANCY. RIGHT FALLOPIAN TUBE: - PREVIOUS TUBAL LIGATION. - NO ENDOMETRIOSIS OR MALIGNANCY. LEFT FALLOPIAN TUBE: - BENIGN ADENOMATOID TUMOR, 0.8 CM. - PREVIOUS TUBAL LIGATION. - NO ENDOMETRIOSIS OR MALIGNANCY. Microscopic Comment 3. UTERUS: Procedure: Hysterectomy with bilateral salpingo-oophorectomy and left external iliac and pelvic lymph nodes. Specimen Integrity: 1.5 cm irregular defect in the endocervical canal  Tumor Size: 2.3 cm. 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 Histologic Grade (required only for adenosarcoma): High grade, grade III Myometrial Invasion (required only for adenosarcoma): 0.5 cm in a 3 cm thick myometrium. Other Tissue/ Organ Involvement: No. Margins: Free of tumor. Lymphovascular Invasion: Not identified. Regional Lymph Nodes: No lymph nodes submitted or found: 6 All lymph nodes negative for tumor cells Number of Lymph Nodes Examined: 6 Pathologic Stage Classification (pTNM, AJCC 8th Edition): pT1a, pN0 Ancillary Studies: Immunohistochemistry for cytokeratin AE1/AE3, cytokeratin 8/18, cytokeratin 903, cytokeratin 5/6 and p53. Representative Tumor Block: 3E, 65F, 3H, 3I, and 3J. Comment(s): The tumor is partially polypoid, is 2.3 cm in greatest dimension and has features of a high grade serous carcinoma. There is also an undifferentiated component with sarcomatoid features and immunohistochemistry shows positivity with cytokeratin 8/18 and is negative with cytokeratin  AE1/AE3, cytokeratin 5/6, cytokeratin 903 and p53. The immunophenotype is most consistent with high grade carcinoma with sarcomatoid features. Immunohistochemistry for cytokeratin AE1/AE3 is performed on all of the lymph nodes (parts 1 and 2) and no positivity is identified.      11/14/2017 Surgery    Assistants: Dr Lahoma Crocker (an MD assistant was necessary for tissue manipulation, management of robotic instrumentation, retraction and positioning due to the complexity of the case and hospital policies).  Operation: Robotic-assisted laparoscopic total hysterectomy with bilateral salpingoophorectomy, SLN biopsy, right pelvic lymphadenectomy  Surgeon: Donaciano Eva  Operative Findings:  : 12cm bulky broad fibroid uterus. Pelvic adnexal adhesions between right ovary and fallopian tube and pelvic sidewall. Bladder adhesions from prior cesarean section. Grossly normal appearing ovaries. No apparent extrauterine disease. Multiple subserosal fibroids. Unilateral dye mapping to left. No suspicious lymph nodes.   Estimated Blood Loss:  less than 50 mL      Total IV Fluids: 800 ml         Specimens: uterus, cervix, bilateral tubes and ovaries, right pelvic lymph nodes, left external iliac sentinel lymph node.          Complications:  None; patient tolerated the procedure well.        11/16/2017 Imaging    CT head No acute intracranial abnormalities to explain the patient's symptoms.      11/24/2017 Cancer Staging    Staging form: Corpus Uteri - Carcinoma and Carcinosarcoma, AJCC 8th Edition - Pathologic: Stage IA (pT1a, pN0, cM0) - Signed by Heath Lark, MD on 11/24/2017      12/06/2017 Procedure    Status post right IJ port  catheter placement. Catheter ready for use      12/14/2017 -  Chemotherapy    The patient had carboplatin and taxol      12/22/2017 Imaging    1. No convincing evidence of fistula between the lower urinary tract and vagina. 2. There is a small  low-attenuation collection in the pelvic cul-de-sac adjacent to the vaginal cuff measuring 2.6 x 1.9 x 2.0 cm (volume = 5.2 cm^3). This may represent a small amount of ascites or postoperative hematoma/seroma and could represent a potential source for vaginal discharge.        REVIEW OF SYSTEMS:   Constitutional: Denies fevers, chills or abnormal weight loss Eyes: Denies blurriness of vision Ears, nose, mouth, throat, and face: Denies mucositis or sore throat Respiratory: Denies cough, dyspnea or wheezes Cardiovascular: Denies palpitation, chest discomfort  Gastrointestinal:  Denies nausea, heartburn or change in bowel habits Skin: Denies abnormal skin rashes Lymphatics: Denies new lymphadenopathy or easy bruising Neurological:Denies numbness, tingling or new weaknesses Behavioral/Psych: Mood is stable, no new changes  All other systems were reviewed with the patient and are negative.  I have reviewed the past medical history, past surgical history, social history and family history with the patient and they are unchanged from previous note.  ALLERGIES:  is allergic to vicodin [hydrocodone-acetaminophen].  MEDICATIONS:  Current Outpatient Medications  Medication Sig Dispense Refill  . amLODipine (NORVASC) 10 MG tablet Take 1 tablet (10 mg total) by mouth daily. 90 tablet 3  . dexamethasone (DECADRON) 4 MG tablet Take 5 tabs at the night before and 5 tab the morning of chemotherapy, every 3 weeks, by mouth 60 tablet 0  . furosemide (LASIX) 40 MG tablet Take 1 tablet (40 mg total) by mouth daily. 30 tablet 0  . hydrALAZINE (APRESOLINE) 10 MG tablet Take 1 tablet (10 mg total) by mouth 3 (three) times daily. 90 tablet 0  . lidocaine-prilocaine (EMLA) cream Apply to affected area once 30 g 3  . metoprolol tartrate (LOPRESSOR) 25 MG tablet Take 1 tablet (25 mg total) by mouth 2 (two) times daily. 60 tablet 0  . ondansetron (ZOFRAN) 8 MG tablet Take 1 tablet (8 mg total) by mouth every 8  (eight) hours as needed for refractory nausea / vomiting. Start on day 3 after chemo. 30 tablet 1  . polyethylene glycol (MIRALAX / GLYCOLAX) packet Take 17 g by mouth daily as needed for mild constipation.     . prochlorperazine (COMPAZINE) 10 MG tablet Take 1 tablet (10 mg total) by mouth every 6 (six) hours as needed (Nausea or vomiting). 30 tablet 1  . senna (SENOKOT) 8.6 MG TABS tablet Take 1 tablet by mouth daily.     No current facility-administered medications for this visit.    Facility-Administered Medications Ordered in Other Visits  Medication Dose Route Frequency Provider Last Rate Last Dose  . CARBOplatin (PARAPLATIN) 670 mg in sodium chloride 0.9 % 250 mL chemo infusion  670 mg Intravenous Once Alvy Bimler, Zebulan Hinshaw, MD      . fosaprepitant (EMEND) 150 mg, dexamethasone (DECADRON) 12 mg in sodium chloride 0.9 % 145 mL IVPB   Intravenous Once Heath Lark, MD 454 mL/hr at 01/04/18 1316    . heparin lock flush 100 unit/mL  500 Units Intracatheter Once PRN Alvy Bimler, Srihitha Tagliaferri, MD      . PACLitaxel (TAXOL) 264 mg in sodium chloride 0.9 % 250 mL chemo infusion (> 74m/m2)  1751.0258mg/m2 (Treatment Plan Recorded) Intravenous Once GHeath Lark MD      .  sodium chloride flush (NS) 0.9 % injection 10 mL  10 mL Intracatheter PRN Alvy Bimler, Patrycja Mumpower, MD        PHYSICAL EXAMINATION: ECOG PERFORMANCE STATUS: 1 - Symptomatic but completely ambulatory  Vitals:   01/04/18 1108  BP: (!) 152/83  Pulse: 81  Resp: 18  Temp: 97.7 F (36.5 C)  SpO2: 100%   Filed Weights   01/04/18 1108  Weight: 240 lb 12.8 oz (109.2 kg)    GENERAL:alert, no distress and comfortable SKIN: skin color, texture, turgor are normal, no rashes or significant lesions EYES: normal, Conjunctiva are pink and non-injected, sclera clear OROPHARYNX:no exudate, no erythema and lips, buccal mucosa, and tongue normal  NECK: supple, thyroid normal size, non-tender, without nodularity LYMPH:  no palpable lymphadenopathy in the cervical, axillary  or inguinal LUNGS: clear to auscultation and percussion with normal breathing effort HEART: regular rate & rhythm and no murmurs with mild bilateral lower extremity edema ABDOMEN:abdomen soft, non-tender and normal bowel sounds Musculoskeletal:no cyanosis of digits and no clubbing  NEURO: alert & oriented x 3 with fluent speech, no focal motor/sensory deficits  LABORATORY DATA:  I have reviewed the data as listed    Component Value Date/Time   NA 137 01/04/2018 1008   K 4.4 01/04/2018 1008   CL 106 01/04/2018 1008   CO2 23 01/04/2018 1008   GLUCOSE 144 (H) 01/04/2018 1008   BUN 23 (H) 01/04/2018 1008   CREATININE 1.23 (H) 01/04/2018 1008   CALCIUM 10.3 01/04/2018 1008   PROT 8.3 (H) 01/04/2018 1008   ALBUMIN 4.1 01/04/2018 1008   AST 17 01/04/2018 1008   ALT 25 01/04/2018 1008   ALKPHOS 131 (H) 01/04/2018 1008   BILITOT 0.3 01/04/2018 1008   GFRNONAA 48 (L) 01/04/2018 1008   GFRAA 56 (L) 01/04/2018 1008    No results found for: SPEP, UPEP  Lab Results  Component Value Date   WBC 10.3 01/04/2018   NEUTROABS 8.1 (H) 01/04/2018   HGB 11.8 01/04/2018   HCT 35.3 01/04/2018   MCV 87.8 01/04/2018   PLT 327 01/04/2018      Chemistry      Component Value Date/Time   NA 137 01/04/2018 1008   K 4.4 01/04/2018 1008   CL 106 01/04/2018 1008   CO2 23 01/04/2018 1008   BUN 23 (H) 01/04/2018 1008   CREATININE 1.23 (H) 01/04/2018 1008      Component Value Date/Time   CALCIUM 10.3 01/04/2018 1008   ALKPHOS 131 (H) 01/04/2018 1008   AST 17 01/04/2018 1008   ALT 25 01/04/2018 1008   BILITOT 0.3 01/04/2018 1008       RADIOGRAPHIC STUDIES: I have personally reviewed the radiological images as listed and agreed with the findings in the report. Ct Pelvis W Contrast  Result Date: 12/22/2017 CLINICAL DATA:  57 year old female with a history of endometrial cancer status post hysterectomy in May of 2019. Current complaint of clear thin nearly continuous discharge. Evaluate for  fistula. EXAM: CT PELVIS WITH CONTRAST TECHNIQUE: Multidetector CT imaging of the pelvis was performed using the standard protocol following the bolus administration of intravenous contrast. CONTRAST:  133m ISOVUE-300 IOPAMIDOL (ISOVUE-300) INJECTION 61%, 394mISOVUE-300 IOPAMIDOL (ISOVUE-300) INJECTION 61% COMPARISON:  Prior CT scan of the abdomen and pelvis 03/18/2009 FINDINGS: Urinary Tract: The visualized distal ureters are unremarkable in appearance. The bladder is well defined and well opacified. A Foley catheter is in place. A small amount of air within the bladder consistent with recent instrumentation. No evidence of  extravasation of contrast into the surrounding soft tissues or into the adjacent vagina. Bowel: No evidence of obstruction or focal bowel wall thickening. Normal appendix in the right lower quadrant. The terminal ileum is unremarkable. Vascular/Lymphatic: No pathologically enlarged lymph nodes. No significant vascular abnormality seen. Reproductive: Surgical changes of prior hysterectomy. Small low-attenuation collection in the recess of [place] adjacent to the vaginal cuff measures 2.6 x 1.9 x 2.0 cm. Other:  None. Musculoskeletal: No acute osseous abnormality. No evidence of lytic or blastic bony lesion. IMPRESSION: 1. No convincing evidence of fistula between the lower urinary tract and vagina. 2. There is a small low-attenuation collection in the pelvic cul-de-sac adjacent to the vaginal cuff measuring 2.6 x 1.9 x 2.0 cm (volume = 5.2 cm^3). This may represent a small amount of ascites or postoperative hematoma/seroma and could represent a potential source for vaginal discharge. Electronically Signed   By: Jacqulynn Cadet M.D.   On: 12/22/2017 14:59   Ir Imaging Guided Port Insertion  Result Date: 12/06/2017 INDICATION: 57 year old female with a history of endometrial carcinoma. She has been referred for port catheter EXAM: IMPLANTED PORT A CATH PLACEMENT WITH ULTRASOUND AND  FLUOROSCOPIC GUIDANCE MEDICATIONS: 2.0 g Ancef; The antibiotic was administered within an appropriate time interval prior to skin puncture. ANESTHESIA/SEDATION: Moderate (conscious) sedation was employed during this procedure. A total of Versed 4 mg and Fentanyl 100 mcg was administered intravenously. Moderate Sedation Time: 17 minutes. The patient's level of consciousness and vital signs were monitored continuously by radiology nursing throughout the procedure under my direct supervision. FLUOROSCOPY TIME:  0 minutes, 6 seconds (1.8 mGy) COMPLICATIONS: None PROCEDURE: The procedure, risks, benefits, and alternatives were explained to the patient. Questions regarding the procedure were encouraged and answered. The patient understands and consents to the procedure. Ultrasound survey was performed with images stored and sent to PACs. The right neck and chest was prepped with chlorhexidine, and draped in the usual sterile fashion using maximum barrier technique (cap and mask, sterile gown, sterile gloves, large sterile sheet, hand hygiene and cutaneous antiseptic). Antibiotic prophylaxis was provided with 2.0g Ancef administered IV one hour prior to skin incision. Local anesthesia was attained by infiltration with 1% lidocaine without epinephrine. Ultrasound demonstrated patency of the right internal jugular vein, and this was documented with an image. Under real-time ultrasound guidance, this vein was accessed with a 21 gauge micropuncture needle and image documentation was performed. A small dermatotomy was made at the access site with an 11 scalpel. A 0.018" wire was advanced into the SVC and used to estimate the length of the internal catheter. The access needle exchanged for a 68F micropuncture vascular sheath. The 0.018" wire was then removed and a 0.035" wire advanced into the IVC. An appropriate location for the subcutaneous reservoir was selected below the clavicle and an incision was made through the skin and  underlying soft tissues. The subcutaneous tissues were then dissected using a combination of blunt and sharp surgical technique and a pocket was formed. A single lumen power injectable portacatheter was then tunneled through the subcutaneous tissues from the pocket to the dermatotomy and the port reservoir placed within the subcutaneous pocket. The venous access site was then serially dilated and a peel away vascular sheath placed over the wire. The wire was removed and the port catheter advanced into position under fluoroscopic guidance. The catheter tip is positioned in the cavoatrial junction. This was documented with a spot image. The portacatheter was then tested and found to flush and aspirate  well. The port was flushed with saline followed by 100 units/mL heparinized saline. The pocket was then closed in two layers using first subdermal inverted interrupted absorbable sutures followed by a running subcuticular suture. The epidermis was then sealed with Dermabond. The dermatotomy at the venous access site was also seal with Dermabond. Patient tolerated the procedure well and remained hemodynamically stable throughout. No complications encountered and no significant blood loss encountered IMPRESSION: Status post right IJ port catheter placement. Catheter ready for use. Signed, Dulcy Fanny. Dellia Nims, RPVI [facility] Electronically Signed   By: Corrie Mckusick D.O.   On: 12/06/2017 14:23    All questions were answered. The patient knows to call the clinic with any problems, questions or concerns. No barriers to learning was detected.  I spent 25 minutes counseling the patient face to face. The total time spent in the appointment was 30 minutes and more than 50% was on counseling and review of test results  Heath Lark, MD 01/04/2018 1:33 PM

## 2018-01-04 NOTE — Assessment & Plan Note (Signed)
The patient has significant fluid retention and diffuse edema She has run out almost all her blood pressure medication and diuretic We discussed importance of close follow-up with primary care doctor and cardiologist I refill multiple prescriptions to our pharmacy for payment assistance

## 2018-01-04 NOTE — Telephone Encounter (Signed)
Per 7/18 los.  Gave patient avs and calendar

## 2018-01-04 NOTE — Patient Instructions (Signed)
[facility] Discharge Instructions for Patients Receiving Chemotherapy  Today you received the following chemotherapy agents: Taxol & Carboplatin  To help prevent nausea and vomiting after your treatment, we encourage you to take your nausea medication as prescribed.  Do not take zofran for three days following your infusion, take compazine instead.   If you develop nausea and vomiting that is not controlled by your nausea medication, call the clinic.   BELOW ARE SYMPTOMS THAT SHOULD BE REPORTED IMMEDIATELY:  *FEVER GREATER THAN 100.5 F  *CHILLS WITH OR WITHOUT FEVER  NAUSEA AND VOMITING THAT IS NOT CONTROLLED WITH YOUR NAUSEA MEDICATION  *UNUSUAL SHORTNESS OF BREATH  *UNUSUAL BRUISING OR BLEEDING  TENDERNESS IN MOUTH AND THROAT WITH OR WITHOUT PRESENCE OF ULCERS  *URINARY PROBLEMS  *BOWEL PROBLEMS  UNUSUAL RASH Items with * indicate a potential emergency and should be followed up as soon as possible.  Feel free to call the clinic should you have any questions or concerns. The clinic phone number is [phone].  Please show the [place] at check-in to the Emergency Department and triage nurse.     Paclitaxel injection What is this medicine? PACLITAXEL (PAK li TAX el) is a chemotherapy drug. It targets fast dividing cells, like cancer cells, and causes these cells to die. This medicine is used to treat ovarian cancer, breast cancer, and other cancers. This medicine may be used for other purposes; ask your health care provider or pharmacist if you have questions. COMMON BRAND NAME(S): Onxol, Taxol What should I tell my health care provider before I take this medicine? They need to know if you have any of these conditions: -blood disorders -irregular heartbeat -infection (especially a virus infection such as chickenpox, cold sores, or herpes) -liver disease -previous or ongoing radiation therapy -an unusual or allergic reaction to paclitaxel,  alcohol, polyoxyethylated castor oil, other chemotherapy agents, other medicines, foods, dyes, or preservatives -pregnant or trying to get pregnant -breast-feeding How should I use this medicine? This drug is given as an infusion into a vein. It is administered in a hospital or clinic by a specially trained health care professional. Talk to your pediatrician regarding the use of this medicine in children. Special care may be needed. Overdosage: If you think you have taken too much of this medicine contact a poison control center or emergency room at once. NOTE: This medicine is only for you. Do not share this medicine with others. What if I miss a dose? It is important not to miss your dose. Call your doctor or health care professional if you are unable to keep an appointment. What may interact with this medicine? Do not take this medicine with any of the following medications: -disulfiram -metronidazole This medicine may also interact with the following medications: -cyclosporine -diazepam -ketoconazole -medicines to increase blood counts like filgrastim, pegfilgrastim, sargramostim -other chemotherapy drugs like cisplatin, doxorubicin, epirubicin, etoposide, teniposide, vincristine -quinidine -testosterone -vaccines -verapamil Talk to your doctor or health care professional before taking any of these medicines: -acetaminophen -aspirin -ibuprofen -ketoprofen -naproxen This list may not describe all possible interactions. Give your health care provider a list of all the medicines, herbs, non-prescription drugs, or dietary supplements you use. Also tell them if you smoke, drink alcohol, or use illegal drugs. Some items may interact with your medicine. What should I watch for while using this medicine? Your condition will be monitored carefully while you are receiving this medicine. You will need important blood work done while you are  taking this medicine. This medicine can cause  serious allergic reactions. To reduce your risk you will need to take other medicine(s) before treatment with this medicine. If you experience allergic reactions like skin rash, itching or hives, swelling of the face, lips, or tongue, tell your doctor or health care professional right away. In some cases, you may be given additional medicines to help with side effects. Follow all directions for their use. This drug may make you feel generally unwell. This is not uncommon, as chemotherapy can affect healthy cells as well as cancer cells. Report any side effects. Continue your course of treatment even though you feel ill unless your doctor tells you to stop. Call your doctor or health care professional for advice if you get a fever, chills or sore throat, or other symptoms of a cold or flu. Do not treat yourself. This drug decreases your body's ability to fight infections. Try to avoid being around people who are sick. This medicine may increase your risk to bruise or bleed. Call your doctor or health care professional if you notice any unusual bleeding. Be careful brushing and flossing your teeth or using a toothpick because you may get an infection or bleed more easily. If you have any dental work done, tell your dentist you are receiving this medicine. Avoid taking products that contain aspirin, acetaminophen, ibuprofen, naproxen, or ketoprofen unless instructed by your doctor. These medicines may hide a fever. Do not become pregnant while taking this medicine. Women should inform their doctor if they wish to become pregnant or think they might be pregnant. There is a potential for serious side effects to an unborn child. Talk to your health care professional or pharmacist for more information. Do not breast-feed an infant while taking this medicine. Men are advised not to father a child while receiving this medicine. This product may contain alcohol. Ask your pharmacist or healthcare provider if this  medicine contains alcohol. Be sure to tell all healthcare providers you are taking this medicine. Certain medicines, like metronidazole and disulfiram, can cause an unpleasant reaction when taken with alcohol. The reaction includes flushing, headache, nausea, vomiting, sweating, and increased thirst. The reaction can last from 30 minutes to several hours. What side effects may I notice from receiving this medicine? Side effects that you should report to your doctor or health care professional as soon as possible: -allergic reactions like skin rash, itching or hives, swelling of the face, lips, or tongue -low blood counts - This drug may decrease the number of white blood cells, red blood cells and platelets. You may be at increased risk for infections and bleeding. -signs of infection - fever or chills, cough, sore throat, pain or difficulty passing urine -signs of decreased platelets or bleeding - bruising, pinpoint red spots on the skin, black, tarry stools, nosebleeds -signs of decreased red blood cells - unusually weak or tired, fainting spells, lightheadedness -breathing problems -chest pain -high or low blood pressure -mouth sores -nausea and vomiting -pain, swelling, redness or irritation at the injection site -pain, tingling, numbness in the hands or feet -slow or irregular heartbeat -swelling of the ankle, feet, hands Side effects that usually do not require medical attention (report to your doctor or health care professional if they continue or are bothersome): -bone pain -complete hair loss including hair on your head, underarms, pubic hair, eyebrows, and eyelashes -changes in the color of fingernails -diarrhea -loosening of the fingernails -loss of appetite -muscle or joint pain -red flush to  skin -sweating This list may not describe all possible side effects. Call your doctor for medical advice about side effects. You may report side effects to FDA at 1-800-FDA-1088. Where  should I keep my medicine? This drug is given in a hospital or clinic and will not be stored at home. NOTE: This sheet is a summary. It may not cover all possible information. If you have questions about this medicine, talk to your doctor, pharmacist, or health care provider.  2018 Elsevier/Gold Standard (2015-04-07 19:58:00)   Carboplatin injection What is this medicine? CARBOPLATIN (KAR boe pla tin) is a chemotherapy drug. It targets fast dividing cells, like cancer cells, and causes these cells to die. This medicine is used to treat ovarian cancer and many other cancers. This medicine may be used for other purposes; ask your health care provider or pharmacist if you have questions. COMMON BRAND NAME(S): Paraplatin What should I tell my health care provider before I take this medicine? They need to know if you have any of these conditions: -blood disorders -hearing problems -kidney disease -recent or ongoing radiation therapy -an unusual or allergic reaction to carboplatin, cisplatin, other chemotherapy, other medicines, foods, dyes, or preservatives -pregnant or trying to get pregnant -breast-feeding How should I use this medicine? This drug is usually given as an infusion into a vein. It is administered in a hospital or clinic by a specially trained health care professional. Talk to your pediatrician regarding the use of this medicine in children. Special care may be needed. Overdosage: If you think you have taken too much of this medicine contact a poison control center or emergency room at once. NOTE: This medicine is only for you. Do not share this medicine with others. What if I miss a dose? It is important not to miss a dose. Call your doctor or health care professional if you are unable to keep an appointment. What may interact with this medicine? -medicines for seizures -medicines to increase blood counts like filgrastim, pegfilgrastim, sargramostim -some antibiotics like  amikacin, gentamicin, neomycin, streptomycin, tobramycin -vaccines Talk to your doctor or health care professional before taking any of these medicines: -acetaminophen -aspirin -ibuprofen -ketoprofen -naproxen This list may not describe all possible interactions. Give your health care provider a list of all the medicines, herbs, non-prescription drugs, or dietary supplements you use. Also tell them if you smoke, drink alcohol, or use illegal drugs. Some items may interact with your medicine. What should I watch for while using this medicine? Your condition will be monitored carefully while you are receiving this medicine. You will need important blood work done while you are taking this medicine. This drug may make you feel generally unwell. This is not uncommon, as chemotherapy can affect healthy cells as well as cancer cells. Report any side effects. Continue your course of treatment even though you feel ill unless your doctor tells you to stop. In some cases, you may be given additional medicines to help with side effects. Follow all directions for their use. Call your doctor or health care professional for advice if you get a fever, chills or sore throat, or other symptoms of a cold or flu. Do not treat yourself. This drug decreases your body's ability to fight infections. Try to avoid being around people who are sick. This medicine may increase your risk to bruise or bleed. Call your doctor or health care professional if you notice any unusual bleeding. Be careful brushing and flossing your teeth or using a toothpick because you  may get an infection or bleed more easily. If you have any dental work done, tell your dentist you are receiving this medicine. Avoid taking products that contain aspirin, acetaminophen, ibuprofen, naproxen, or ketoprofen unless instructed by your doctor. These medicines may hide a fever. Do not become pregnant while taking this medicine. Women should inform their  doctor if they wish to become pregnant or think they might be pregnant. There is a potential for serious side effects to an unborn child. Talk to your health care professional or pharmacist for more information. Do not breast-feed an infant while taking this medicine. What side effects may I notice from receiving this medicine? Side effects that you should report to your doctor or health care professional as soon as possible: -allergic reactions like skin rash, itching or hives, swelling of the face, lips, or tongue -signs of infection - fever or chills, cough, sore throat, pain or difficulty passing urine -signs of decreased platelets or bleeding - bruising, pinpoint red spots on the skin, black, tarry stools, nosebleeds -signs of decreased red blood cells - unusually weak or tired, fainting spells, lightheadedness -breathing problems -changes in hearing -changes in vision -chest pain -high blood pressure -low blood counts - This drug may decrease the number of white blood cells, red blood cells and platelets. You may be at increased risk for infections and bleeding. -nausea and vomiting -pain, swelling, redness or irritation at the injection site -pain, tingling, numbness in the hands or feet -problems with balance, talking, walking -trouble passing urine or change in the amount of urine Side effects that usually do not require medical attention (report to your doctor or health care professional if they continue or are bothersome): -hair loss -loss of appetite -metallic taste in the mouth or changes in taste This list may not describe all possible side effects. Call your doctor for medical advice about side effects. You may report side effects to FDA at 1-800-FDA-1088. Where should I keep my medicine? This drug is given in a hospital or clinic and will not be stored at home. NOTE: This sheet is a summary. It may not cover all possible information. If you have questions about this medicine,  talk to your doctor, pharmacist, or health care provider.  2018 Elsevier/Gold Standard (2007-09-11 14:38:05)

## 2018-01-05 ENCOUNTER — Other Ambulatory Visit: Payer: PRIVATE HEALTH INSURANCE

## 2018-01-05 ENCOUNTER — Ambulatory Visit: Payer: PRIVATE HEALTH INSURANCE

## 2018-01-11 ENCOUNTER — Ambulatory Visit: Payer: PRIVATE HEALTH INSURANCE | Admitting: Radiation Oncology

## 2018-01-15 ENCOUNTER — Telehealth: Payer: Self-pay | Admitting: *Deleted

## 2018-01-15 NOTE — Telephone Encounter (Signed)
CALLED PATIENT TO REMIND OF NEW [facility] [place] FOR 01-16-18, SPOKE WITH PATIENT AND SHE IS AWARE OF THESE APPTS.

## 2018-01-16 ENCOUNTER — Encounter: Payer: Self-pay | Admitting: Oncology

## 2018-01-16 ENCOUNTER — Other Ambulatory Visit: Payer: Self-pay

## 2018-01-16 ENCOUNTER — Ambulatory Visit
Admission: RE | Admit: 2018-01-16 | Discharge: 2018-01-16 | Disposition: A | Discharge status: Home / Self Care | Payer: PRIVATE HEALTH INSURANCE | Source: Ambulatory Visit | Attending: Radiation Oncology | Admitting: Radiation Oncology

## 2018-01-16 ENCOUNTER — Telehealth: Payer: Self-pay | Admitting: *Deleted

## 2018-01-16 ENCOUNTER — Encounter: Payer: Self-pay | Admitting: Radiation Oncology

## 2018-01-16 VITALS — BP 164/99 | HR 51 | Temp 97.9°F | Resp 20 | Ht 64.0 in | Wt 242.2 lb

## 2018-01-16 DIAGNOSIS — C541 Malignant neoplasm of endometrium: Secondary | ICD-10-CM | POA: Insufficient documentation

## 2018-01-16 DIAGNOSIS — Z79899 Other long term (current) drug therapy: Secondary | ICD-10-CM | POA: Insufficient documentation

## 2018-01-16 DIAGNOSIS — Z90710 Acquired absence of both cervix and uterus: Secondary | ICD-10-CM | POA: Insufficient documentation

## 2018-01-16 DIAGNOSIS — Z510 Encounter for antineoplastic radiation therapy: Secondary | ICD-10-CM | POA: Insufficient documentation

## 2018-01-16 MED ORDER — MAGIC MOUTHWASH W/LIDOCAINE: 5.0000 mL | Freq: Four times a day (QID) | ORAL | 0 refills | Status: DC | PRN

## 2018-01-16 MED FILL — CMPD MCO MMW 3:1 VISC LIDO: 7 days supply | Qty: 140 | Fill #0

## 2018-01-16 NOTE — Progress Notes (Signed)
  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  Identity was confirmed.  All relevant records and images related to the planned course of therapy were reviewed.  The patient freely provided informed written consent to proceed with treatment after reviewing the details related to the planned course of therapy. The consent form was witnessed and verified by the simulation staff.  Then, the patient was set-up in a stable reproducible  supine position for radiation therapy.  CT images were obtained.  Surface markings were placed.  The CT images were loaded into the planning software.  Then the target and avoidance structures were contoured.  Treatment planning then occurred.  The radiation prescription was entered and confirmed.   I have requested : Brachytherapy Isodose Plan and Dosimetry Calculations to plan the radiation distribution.    PLAN:  The patient will receive 30 Gy in 5 fractions. The patient will be treated with a 3.0 cm diameter segmented cylinder with a treatment length of 3.5 cm. Prescription will be to the mucosal surface. Iridium 192 will be the high-dose-rate source.   ________________________________  Blair Promise, PhD, MD

## 2018-01-16 NOTE — Progress Notes (Signed)
  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 She will be treated with a 3.0 cm diameter segmented cylinder. This conforms to her anatomy without undue discomfort.  Vaginal brachytherapy procedure node: The patient was brought to the [facility]. Identity was confirmed. All relevant records and images related to the planned course of therapy were reviewed. The patient freely provided informed written consent to proceed with treatment after reviewing the details related to the planned course of therapy. The consent form was witnessed and verified by the simulation staff. Then, the patient was set-up in a stable reproducible supine position for radiation therapy. Pelvic exam revealed the vaginal cuff to be intact . The patient's custom vaginal cylinder was placed in the proximal vagina. This was affixed to the CT/MR stabilization plate to prevent slippage. Patient tolerated the placement well.  Verification simulation note:  A fiducial marker was placed within the vaginal cylinder. An AP and lateral film was then obtained through the pelvis area. This documented accurate position of the vaginal cylinder for treatment.  HDR BRACHYTHERAPY TREATMENT  The remote afterloading device was affixed to the vaginal cylinder by catheter. Patient then proceeded to undergo her first high-dose-rate treatment directed at the proximal vagina. The patient was prescribed a dose of 6.0 gray to be delivered to the mucosal surface. Treatment length was 3.5 cm. Patient was treated with 1 channel using 8 dwell positions. Treatment time was 231.5 seconds. Iridium 192 was the  high-dose-rate source for treatment. The patient tolerated the treatment well. After completion of her therapy, a radiation survey was performed documenting return of the iridium source into the [employer] safe.   PLAN: the patient will return next week for her second high-dose-rate treatment ________________________________  Blair Promise, PhD, MD

## 2018-01-16 NOTE — Telephone Encounter (Signed)
Called patient to discuss sores on gums. Instructed to [place] baking soda mixture: 1/4 teaspoon baking soda, 1/8 teaspoon salt and 1 cup of warm water- rinse and spit 3-4 times a day, follow with a plain water rinse.   Also called in Magic mouthwash to pharmacy- swish and spit 3-4 times a day for 1 week.   Pt verbalized understanding

## 2018-01-16 NOTE — Progress Notes (Signed)
Radiation Oncology         [phone]) [phone] ________________________________  Name: Angela Gardner MRN: [id number]  Date: 01/16/2018  DOB: 14-Apr-1961  Vaginal Brachytherapy Procedure Note  CC: Charlott Rakes, MD Everitt Amber, MD    ICD-10-CM   1. 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 Pt c/o pain associated with multiple mouth sores attributed to chemotherapy. Pt denies dysuria/hematuria. Pt denies vaginal discharge or bleeding. Pt denies rectal bleeding or diarrhea/constipation. Pt denies N/V and abdominal bleeding. no further reports of clear drainage from the vaginal area which prompted fistula workup.    ALLERGIES: is allergic to vicodin [hydrocodone-acetaminophen].  Meds: Current Outpatient Medications  Medication Sig Dispense Refill  . acetaminophen (TYLENOL) 500 MG tablet Take 1,000 mg by mouth every 6 (six) hours as needed.    Marland Kitchen amLODipine (NORVASC) 10 MG tablet Take 1 tablet (10 mg total) by mouth daily. 90 tablet 3  . dexamethasone (DECADRON) 4 MG tablet Take 5 tabs at the night before and 5 tab the morning of chemotherapy, every 3 weeks, by mouth 60 tablet 0  . furosemide (LASIX) 40 MG tablet Take 1 tablet (40 mg total) by mouth daily. 30 tablet 0  . hydrALAZINE (APRESOLINE) 10 MG tablet Take 1 tablet (10 mg total) by mouth 3 (three) times daily. 90 tablet 0  . ibuprofen (ADVIL,MOTRIN) 200 MG tablet Take 400 mg by mouth every 6 (six) hours as needed.    . lidocaine-prilocaine (EMLA) cream Apply to affected area once 30 g 3  . metoprolol tartrate (LOPRESSOR) 25 MG tablet Take 1 tablet (25 mg total) by mouth 2 (two) times daily. 60 tablet 0  . ondansetron (ZOFRAN) 8 MG tablet Take 1 tablet (8 mg total) by mouth every 8 (eight) hours as needed for refractory nausea / vomiting. Start on day 3 after chemo. 30 tablet 1  .  polyethylene glycol (MIRALAX / GLYCOLAX) packet Take 17 g by mouth daily as needed for mild constipation.     . prochlorperazine (COMPAZINE) 10 MG tablet Take 1 tablet (10 mg total) by mouth every 6 (six) hours as needed (Nausea or vomiting). 30 tablet 1  . senna (SENOKOT) 8.6 MG TABS tablet Take 1 tablet by mouth daily.    . magic mouthwash w/lidocaine SOLN Take 5 mLs by mouth 4 (four) times daily as needed for mouth pain. Swish and spit 140 mL 0   No current facility-administered medications for this encounter.     Physical Findings: The patient is in no acute distress. Patient is alert and oriented.  height is 5\' 4"  (1.626 m) and weight is 242 lb 3.2 oz (109.9 kg). Her oral temperature is 97.9 F (36.6 C). Her blood pressure is 164/99 (abnormal) and her pulse is 51 (abnormal). Her respiration is 20 and oxygen saturation is 100%.   No palpable cervical, supraclavicular or axillary lymphoadenopathy. The heart has a regular rate and rhythm. The lungs are clear to auscultation. Abdomen soft and non-tender.  On pelvic examination the external genitalia were unremarkable. A speculum exam was performed. Vaginal cuff intact, no mucosal lesions. On bimanual exam there were no pelvic masses appreciated.  Lab Findings: Lab Results  Component Value Date   WBC 10.3 01/04/2018   HGB 11.8 01/04/2018   HCT 35.3 01/04/2018   MCV 87.8 01/04/2018   PLT 327 01/04/2018    Radiographic Findings:  Ct Pelvis W Contrast  Result Date: 12/22/2017 CLINICAL DATA:  57 year old female with a history of endometrial cancer status post hysterectomy in May of 2019. Current complaint of clear thin nearly continuous discharge. Evaluate for fistula. EXAM: CT PELVIS WITH CONTRAST TECHNIQUE: Multidetector CT imaging of the pelvis was performed using the standard protocol following the bolus administration of intravenous contrast. CONTRAST:  179mL ISOVUE-300 IOPAMIDOL (ISOVUE-300) INJECTION 61%, 79mL ISOVUE-300 IOPAMIDOL  (ISOVUE-300) INJECTION 61% COMPARISON:  Prior CT scan of the abdomen and pelvis 03/18/2009 FINDINGS: Urinary Tract: The visualized distal ureters are unremarkable in appearance. The bladder is well defined and well opacified. A Foley catheter is in place. A small amount of air within the bladder consistent with recent instrumentation. No evidence of extravasation of contrast into the surrounding soft tissues or into the adjacent vagina. Bowel: No evidence of obstruction or focal bowel wall thickening. Normal appendix in the right lower quadrant. The terminal ileum is unremarkable. Vascular/Lymphatic: No pathologically enlarged lymph nodes. No significant vascular abnormality seen. Reproductive: Surgical changes of prior hysterectomy. Small low-attenuation collection in the recess of [place] adjacent to the vaginal cuff measures 2.6 x 1.9 x 2.0 cm. Other:  None. Musculoskeletal: No acute osseous abnormality. No evidence of lytic or blastic bony lesion. IMPRESSION: 1. No convincing evidence of fistula between the lower urinary tract and vagina. 2. There is a small low-attenuation collection in the pelvic cul-de-sac adjacent to the vaginal cuff measuring 2.6 x 1.9 x 2.0 cm (volume = 5.2 cm^3). This may represent a small amount of ascites or postoperative hematoma/seroma and could represent a potential source for vaginal discharge. Electronically Signed   By: Jacqulynn Cadet M.D.   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 The patient will be treated with a 3.0 cm diameter cylinder with a treatment length of 3.5 cm. This distended the vaginal vault without undue discomfort. The patient tolerated the procedure well.  The patient was successfully fitted for a vaginal cylinder. The patient is appropriate to begin vaginal brachytherapy.   Plan: The patient will proceed with CT simulation and  vaginal brachytherapy today.    _______________________________   Blair Promise, PhD, MD

## 2018-01-16 NOTE — Telephone Encounter (Signed)
-----   Message from Heath Lark, MD sent at 01/16/2018  8:36 AM EDT ----- Regarding: RE: Mouth Sores Hi Jaz Mallick,  Call the patient; call in Magic Mouth wash swish and spit for 1 week and baking soda mix remedy ----- Message ----- From: Jacqulyn Liner, RN Sent: 01/16/2018   8:31 AM To: Heath Lark, MD Subject: Mouth Sores                                    I saw [name] this morning for her first HDR treatment.  She is having pain from sores on her gums.  She said she has been taking tylenol but it is not really helping.  Do you recommend magic mouthwash for the sores?    Thanks! Santiago Glad

## 2018-01-16 NOTE — Progress Notes (Signed)
Pt presents today for new [place] HDR with Dr. Sondra Come. Pt c/o pain associated with multiple mouth sores attributed to chemotherapy. Pt denies dysuria/hematuria. Pt denies vaginal discharge or bleeding. Pt denies rectal bleeding or diarrhea/constipation. Pt denies N/V and abdominal bleeding. Pt is accompanied by sister. K. Hess, RN GYN Navigator in with pt to review procedure and treatment.   BP (!) 164/99 (BP Location: Right Arm, Patient Position: Sitting, Cuff Size: Normal)   Pulse (!) 51   Temp 97.9 F (36.6 C) (Oral)   Resp 20   Ht 5\' 4"  (1.626 m)   Wt 242 lb 3.2 oz (109.9 kg)   LMP  (LMP Unknown) Comment: vaginal bleeding continuously for last 8 months  SpO2 100%   BMI 41.57 kg/m   Wt Readings from Last 3 Encounters:  01/16/18 242 lb 3.2 oz (109.9 kg)  01/04/18 240 lb 12.8 oz (109.2 kg)  12/25/17 241 lb (109.3 kg)   Loma Sousa, RN BSN

## 2018-01-17 ENCOUNTER — Ambulatory Visit: Payer: PRIVATE HEALTH INSURANCE | Admitting: Radiation Oncology

## 2018-01-18 ENCOUNTER — Ambulatory Visit: Payer: PRIVATE HEALTH INSURANCE | Admitting: Radiation Oncology

## 2018-01-23 ENCOUNTER — Telehealth: Payer: Self-pay | Admitting: *Deleted

## 2018-01-23 NOTE — Telephone Encounter (Signed)
Called patient to remind of HDR Tx. For 01-24-18, spoke with patient and she is aware of this tx.

## 2018-01-24 ENCOUNTER — Ambulatory Visit
Admission: RE | Admit: 2018-01-24 | Discharge: 2018-01-24 | Disposition: A | Discharge status: Home / Self Care | Payer: Self-pay | Source: Ambulatory Visit | Attending: Radiation Oncology | Admitting: Radiation Oncology

## 2018-01-24 DIAGNOSIS — C541 Malignant neoplasm of endometrium: Secondary | ICD-10-CM | POA: Insufficient documentation

## 2018-01-24 DIAGNOSIS — Z510 Encounter for antineoplastic radiation therapy: Secondary | ICD-10-CM | POA: Insufficient documentation

## 2018-01-24 NOTE — Progress Notes (Signed)
  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 She will be treated with a 3.0 cm diameter segmented cylinder. This conforms to her anatomy without undue discomfort.  Vaginal brachytherapy procedure node: The patient was brought to the [place] suite. Identity was confirmed. All relevant records and images related to the planned course of therapy were reviewed. The patient freely provided informed written consent to proceed with treatment after reviewing the details related to the planned course of therapy. The consent form was witnessed and verified by the simulation staff. Then, the patient was set-up in a stable reproducible supine position for radiation therapy. Pelvic exam revealed the vaginal cuff to be intact . The patient's custom vaginal cylinder was placed in the proximal vagina. This was affixed to the CT/MR stabilization plate to prevent slippage. Patient tolerated the placement well.  Verification simulation note:  A fiducial marker was placed within the vaginal cylinder. An AP and lateral film was then obtained through the pelvis area. This documented accurate position of the vaginal cylinder for treatment.  HDR BRACHYTHERAPY TREATMENT  The remote afterloading device was affixed to the vaginal cylinder by catheter. Patient then proceeded to undergo her second high-dose-rate treatment directed at the proximal vagina. The patient was prescribed a dose of 6.0 gray to be delivered to the mucosal surface. Treatment length was 3.5 cm. Patient was treated with 1 channel using 8 dwell positions. Treatment time was 249.1 seconds. Iridium 192 was the  high-dose-rate source for treatment. The patient tolerated the treatment well. After completion of her therapy, a radiation survey was performed documenting return of the iridium source into the [employer] safe.   PLAN: The patient will return next week for her third high-dose-rate treatment ________________________________  Blair Promise, PhD, MD   This document serves as a record of services personally performed by Gery Pray, MD. It was created on his behalf by Arlyce Harman, a trained medical scribe. The creation of this record is based on the scribe's personal observations and the provider's statements to them. This document has been checked and approved by the attending provider.

## 2018-01-25 ENCOUNTER — Inpatient Hospital Stay: Payer: Medicaid Other | Attending: Hematology and Oncology

## 2018-01-25 ENCOUNTER — Encounter: Payer: Self-pay | Admitting: Hematology and Oncology

## 2018-01-25 ENCOUNTER — Inpatient Hospital Stay: Payer: Medicaid Other

## 2018-01-25 ENCOUNTER — Inpatient Hospital Stay (HOSPITAL_BASED_OUTPATIENT_CLINIC_OR_DEPARTMENT_OTHER): Payer: Medicaid Other | Admitting: Hematology and Oncology

## 2018-01-25 ENCOUNTER — Ambulatory Visit: Payer: PRIVATE HEALTH INSURANCE | Admitting: Radiation Oncology

## 2018-01-25 VITALS — BP 151/81 | HR 89 | Temp 98.4°F | Resp 20

## 2018-01-25 DIAGNOSIS — Z5111 Encounter for antineoplastic chemotherapy: Secondary | ICD-10-CM | POA: Insufficient documentation

## 2018-01-25 DIAGNOSIS — M7918 Myalgia, other site: Secondary | ICD-10-CM | POA: Diagnosis not present

## 2018-01-25 DIAGNOSIS — I5030 Unspecified diastolic (congestive) heart failure: Secondary | ICD-10-CM

## 2018-01-25 DIAGNOSIS — N183 Chronic kidney disease, stage 3 unspecified: Secondary | ICD-10-CM

## 2018-01-25 DIAGNOSIS — F1911 Other psychoactive substance abuse, in remission: Secondary | ICD-10-CM | POA: Diagnosis not present

## 2018-01-25 DIAGNOSIS — I5032 Chronic diastolic (congestive) heart failure: Secondary | ICD-10-CM

## 2018-01-25 DIAGNOSIS — C541 Malignant neoplasm of endometrium: Secondary | ICD-10-CM | POA: Insufficient documentation

## 2018-01-25 DIAGNOSIS — I130 Hypertensive heart and chronic kidney disease with heart failure and stage 1 through stage 4 chronic kidney disease, or unspecified chronic kidney disease: Secondary | ICD-10-CM | POA: Insufficient documentation

## 2018-01-25 DIAGNOSIS — I10 Essential (primary) hypertension: Secondary | ICD-10-CM

## 2018-01-25 DIAGNOSIS — R300 Dysuria: Secondary | ICD-10-CM

## 2018-01-25 LAB — CBC WITH DIFFERENTIAL (CANCER CENTER ONLY)
BASOS ABS: 0 10*3/uL (ref 0.0–0.1)
BASOS PCT: 1 %
Eosinophils Absolute: 0 10*3/uL (ref 0.0–0.5)
Eosinophils Relative: 0 %
HCT: 34.7 % — ABNORMAL LOW (ref 34.8–46.6)
Hemoglobin: 11.7 g/dL (ref 11.6–15.9)
Lymphocytes Relative: 22 %
Lymphs Abs: 1.4 10*3/uL (ref 0.9–3.3)
MCH: 29.8 pg (ref 25.1–34.0)
MCHC: 33.8 g/dL (ref 31.5–36.0)
MCV: 88.2 fL (ref 79.5–101.0)
MONOS PCT: 1 %
Monocytes Absolute: 0.1 10*3/uL (ref 0.1–0.9)
NEUTROS ABS: 4.8 10*3/uL (ref 1.5–6.5)
NEUTROS PCT: 76 %
PLATELETS: 323 10*3/uL (ref 145–400)
RBC: 3.93 MIL/uL (ref 3.70–5.45)
RDW: 16 % — AB (ref 11.2–14.5)
WBC Count: 6.3 10*3/uL (ref 3.9–10.3)

## 2018-01-25 LAB — CMP (CANCER CENTER ONLY)
ALK PHOS: 126 U/L (ref 38–126)
ALT: 16 U/L (ref 0–44)
ANION GAP: 12 (ref 5–15)
AST: 16 U/L (ref 15–41)
Albumin: 3.9 g/dL (ref 3.5–5.0)
BUN: 27 mg/dL — ABNORMAL HIGH (ref 6–20)
CALCIUM: 10.7 mg/dL — AB (ref 8.9–10.3)
CO2: 24 mmol/L (ref 22–32)
Chloride: 105 mmol/L (ref 98–111)
Creatinine: 1.38 mg/dL — ABNORMAL HIGH (ref 0.44–1.00)
GFR, EST AFRICAN AMERICAN: 49 mL/min — AB (ref >60)
GFR, Estimated: 42 mL/min — ABNORMAL LOW (ref >60)
Glucose, Bld: 177 mg/dL — ABNORMAL HIGH (ref 70–99)
Potassium: 4 mmol/L (ref 3.5–5.1)
SODIUM: 141 mmol/L (ref 135–145)
Total Bilirubin: 0.3 mg/dL (ref 0.3–1.2)
Total Protein: 8.2 g/dL — ABNORMAL HIGH (ref 6.5–8.1)

## 2018-01-25 MED ORDER — DIPHENHYDRAMINE HCL 50 MG/ML IJ SOLN
50.0000 mg | Freq: Once | INTRAMUSCULAR | Status: AC
  Administered 2018-01-25: 50 mg via INTRAVENOUS

## 2018-01-25 MED ORDER — FOSAPREPITANT DIMEGLUMINE INJECTION 150 MG
Freq: Once | INTRAVENOUS | Status: AC
  Administered 2018-01-25: 11:00:00 via INTRAVENOUS
  Filled 2018-01-25: qty 5

## 2018-01-25 MED ORDER — DIPHENHYDRAMINE HCL 50 MG/ML IJ SOLN
INTRAMUSCULAR | Status: AC
  Filled 2018-01-25: qty 1

## 2018-01-25 MED ORDER — SODIUM CHLORIDE 0.9 % IV SOLN
611.4000 mg | Freq: Once | INTRAVENOUS | Status: AC
  Administered 2018-01-25: 610 mg via INTRAVENOUS
  Filled 2018-01-25: qty 61

## 2018-01-25 MED ORDER — SODIUM CHLORIDE 0.9% FLUSH
10.0000 mL | Freq: Once | INTRAVENOUS | Status: AC
  Administered 2018-01-25: 10 mL
  Filled 2018-01-25: qty 10

## 2018-01-25 MED ORDER — FAMOTIDINE IN NACL 20-0.9 MG/50ML-% IV SOLN
20.0000 mg | Freq: Once | INTRAVENOUS | Status: AC
  Administered 2018-01-25: 20 mg via INTRAVENOUS

## 2018-01-25 MED ORDER — FAMOTIDINE IN NACL 20-0.9 MG/50ML-% IV SOLN
INTRAVENOUS | Status: AC
Start: 2018-01-25 — End: ?
  Filled 2018-01-25: qty 50

## 2018-01-25 MED ORDER — CLONIDINE HCL 0.1 MG PO TABS
0.2000 mg | ORAL_TABLET | Freq: Once | ORAL | Status: AC
  Administered 2018-01-25: 0.2 mg via ORAL

## 2018-01-25 MED ORDER — CLONIDINE HCL 0.1 MG PO TABS
ORAL_TABLET | ORAL | Status: AC
  Filled 2018-01-25: qty 2

## 2018-01-25 MED ORDER — SODIUM CHLORIDE 0.9% FLUSH
10.0000 mL | INTRAVENOUS | Status: DC | PRN
  Administered 2018-01-25: 10 mL
  Filled 2018-01-25: qty 10

## 2018-01-25 MED ORDER — PALONOSETRON HCL INJECTION 0.25 MG/5ML
INTRAVENOUS | Status: AC
  Filled 2018-01-25: qty 5

## 2018-01-25 MED ORDER — HEPARIN SOD (PORK) LOCK FLUSH 100 UNIT/ML IV SOLN
500.0000 [IU] | Freq: Once | INTRAVENOUS | Status: AC | PRN
  Administered 2018-01-25: 500 [IU]
  Filled 2018-01-25: qty 5

## 2018-01-25 MED ORDER — PALONOSETRON HCL INJECTION 0.25 MG/5ML
0.2500 mg | Freq: Once | INTRAVENOUS | Status: AC
  Administered 2018-01-25: 0.25 mg via INTRAVENOUS

## 2018-01-25 MED ORDER — SODIUM CHLORIDE 0.9 % IV SOLN
116.6667 mg/m2 | Freq: Once | INTRAVENOUS | Status: AC
  Administered 2018-01-25: 264 mg via INTRAVENOUS
  Filled 2018-01-25: qty 44

## 2018-01-25 MED ORDER — SODIUM CHLORIDE 0.9 % IV SOLN
Freq: Once | INTRAVENOUS | Status: AC
  Administered 2018-01-25: 10:00:00 via INTRAVENOUS
  Filled 2018-01-25: qty 250

## 2018-01-25 NOTE — Progress Notes (Signed)
[place] OFFICE PROGRESS NOTE  Patient Care Team: Charlott Rakes, MD as PCP - General (Family Medicine)  ASSESSMENT & PLAN:  Endometrial cancer [facility]) She tolerated cycle 1 of treatment poorly due to diffuse bone pain which I think is exacerbated by Taxol With dose adjustment, she tolerated treatment better I will see her back next cycle for further toxicity review  Chronic kidney disease (CKD), stage III (moderate) ([place]) Her kidney function is stable We will adjust chemotherapy accordingly  Essential hypertension She has severe, uncontrolled hypertension She has not been compliant with low-salt diet We had extensive discussion about dietary modification and return appointment to see nephrologist and cardiologist for medication adjustment of her blood pressure medications Today, we will give her extra dose of clonidine during treatment  Diastolic congestive heart failure, NYHA class 3 ([place]) The patient has no signs of significant edema today We discussed importance of close follow-up with primary care doctor and cardiologist    No orders of the defined types were placed in this encounter.   INTERVAL HISTORY: Please see below for problem oriented charting. She returns for further chemotherapy today Since last time I saw her, she tolerated reduced dose chemotherapy well She tolerated radiation well No nausea, changes in bowel habits or peripheral neuropathy No recent infection, fever or chills She has not have seen her cardiologist or nephrologist She is taking all her blood pressure medications as directed When I inquire about salt intake, she has not been well compliant with low-salt diet  SUMMARY OF ONCOLOGIC HISTORY: Oncology History   High grade serous with sarcomatoid features MSI low     Endometrial cancer ([place])   05/21/2015 Imaging    US pelvis 1. Severe endometrial thickening at 35.2 mm. Large echogenic focus noted within the endometrial canal  measuring 4.7 x 1.7 x 3.0 cm.  Endometrial fluid noted. These findings are suspicious for endometrial carcinoma .  2. Associated uterine enlargement. Two probable small fibroids are noted along the posterior fundus. The largest measures 3.7 cm.  3.  Ovaries not visualized.    05/21/2015 Initial Diagnosis    She presented with postmenopausal bleeding    06/15/2015 Pathology Results    Endometrium, biopsy - FRAGMENTED INACTIVE ENDOMETRIUM. - NO ATYPIA OR MALIGNANCY    09/13/2017 Imaging    Normal LV size with severe LV hypertrophy. EF 55%. Moderate diastolic dysfunction. Normal RV size and systolic function. No significant valvular abnormalities.    09/13/2017 Imaging    1. No acute intrathoracic, abdominal, or pelvic pathology. No aortic aneurysm or dissection. No CT evidence of pulmonary embolus. 2. Cardiomegaly with left ventricular hypertrophy    09/15/2017 Imaging    US pelvis Concern for large echogenic mass within the endometrium measuring 3.8 x 3.8 x 3.6 cm. Recommend further evaluation with sonohysterogram or hysteroscopy/biopsy.  3.8 cm fundal subserosal fibroid.     10/09/2017 Pathology Results    Endometrium, biopsy - HIGH GRADE ADENOCARCINOMA. - SEE MICROSCOPIC DESCRIPTION. Microscopic Comment There is abundant secretory type endometrium and scattered fragments of high grade adenocarcinoma, which focally has features suggestive of high grade serous carcinoma. There is also a microscopic focus with high grade carcinoma and associated spindled cell component. In this small focus a sarcomatous component cannot be ruled out; however, carcinoma with sarcomatoid features is favored.    11/14/2017 Pathology Results    1. Lymph node, sentinel, biopsy, left external iliac - ONE BENIGN LYMPH NODE (0/1). 2. Lymph nodes, regional resection, right pelvic - FIVE BENIGN  LYMPH NODES (0/5). 3. Uterus +/- tubes/ovaries, neoplastic, cervix, bilateral tubes and ovaries ENDOMETRIUM: -  HIGH GRADE ENDOMETRIAL CARCINOMA WITH SARCOMATOID FEATURES, 2.3 CM. - CARCINOMA INVADES 0.5 CM IN A 3 CM MYOMETRIUM. - CERVIX, BILATERAL OVARIES AND BILATERAL FALLOPIAN TUBES FREE OF TUMOR. CERVIX: - NABOTHIAN CYSTS. - NO DYSPLASIA OR MALIGNANCY. MYOMETRIUM: - ADENOMYOSIS. - LEIOMYOMATA. RIGHT AND LEFT OVARIES: - UNREMARKABLE. - NO ENDOMETRIOSIS OR MALIGNANCY. RIGHT FALLOPIAN TUBE: - PREVIOUS TUBAL LIGATION. - NO ENDOMETRIOSIS OR MALIGNANCY. LEFT FALLOPIAN TUBE: - BENIGN ADENOMATOID TUMOR, 0.8 CM. - PREVIOUS TUBAL LIGATION. - NO ENDOMETRIOSIS OR MALIGNANCY. Microscopic Comment 3. UTERUS: Procedure: Hysterectomy with bilateral salpingo-oophorectomy and left external iliac and pelvic lymph nodes. Specimen Integrity: 1.5 cm irregular defect in the endocervical canal  Tumor Size: 2.3 cm. 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 Histologic Grade (required only for adenosarcoma): High grade, grade III Myometrial Invasion (required only for adenosarcoma): 0.5 cm in a 3 cm thick myometrium. Other Tissue/ Organ Involvement: No. Margins: Free of tumor. Lymphovascular Invasion: Not identified. Regional Lymph Nodes: No lymph nodes submitted or found: 6 All lymph nodes negative for tumor cells Number of Lymph Nodes Examined: 6 Pathologic Stage Classification (pTNM, AJCC 8th Edition): pT1a, pN0 Ancillary Studies: Immunohistochemistry for cytokeratin AE1/AE3, cytokeratin 8/18, cytokeratin 903, cytokeratin 5/6 and p53. Representative Tumor Block: 3E, 45F, 3H, 3I, and 3J. Comment(s): The tumor is partially polypoid, is 2.3 cm in greatest dimension and has features of a high grade serous carcinoma. There is also an undifferentiated component with sarcomatoid features and immunohistochemistry shows positivity with cytokeratin 8/18 and is negative with cytokeratin AE1/AE3, cytokeratin 5/6, cytokeratin 903 and p53. The immunophenotype is most consistent with high grade  carcinoma with sarcomatoid features. Immunohistochemistry for cytokeratin AE1/AE3 is performed on all of the lymph nodes (parts 1 and 2) and no positivity is identified.    11/14/2017 Surgery    Assistants: Dr Lahoma Crocker (an MD assistant was necessary for tissue manipulation, management of robotic instrumentation, retraction and positioning due to the complexity of the case and hospital policies).  Operation: Robotic-assisted laparoscopic total hysterectomy with bilateral salpingoophorectomy, SLN biopsy, right pelvic lymphadenectomy  Surgeon: Donaciano Eva  Operative Findings:  : 12cm bulky broad fibroid uterus. Pelvic adnexal adhesions between right ovary and fallopian tube and pelvic sidewall. Bladder adhesions from prior cesarean section. Grossly normal appearing ovaries. No apparent extrauterine disease. Multiple subserosal fibroids. Unilateral dye mapping to left. No suspicious lymph nodes.   Estimated Blood Loss:  less than 50 mL      Total IV Fluids: 800 ml         Specimens: uterus, cervix, bilateral tubes and ovaries, right pelvic lymph nodes, left external iliac sentinel lymph node.          Complications:  None; patient tolerated the procedure well.      11/16/2017 Imaging    CT head No acute intracranial abnormalities to explain the patient's symptoms.    11/24/2017 Cancer Staging    Staging form: Corpus Uteri - Carcinoma and Carcinosarcoma, AJCC 8th Edition - Pathologic: Stage IA (pT1a, pN0, cM0) - Signed by Heath Lark, MD on 11/24/2017    12/06/2017 Procedure    Status post right IJ port catheter placement. Catheter ready for use    12/14/2017 -  Chemotherapy    The patient had carboplatin and taxol    12/22/2017 Imaging    1. No convincing evidence of fistula between the lower urinary tract and vagina. 2. There is a small  low-attenuation collection in the pelvic cul-de-sac adjacent to the vaginal cuff measuring 2.6 x 1.9 x 2.0 cm (volume = 5.2 cm^3).  This may represent a small amount of ascites or postoperative hematoma/seroma and could represent a potential source for vaginal discharge.      REVIEW OF SYSTEMS:   Constitutional: Denies fevers, chills or abnormal weight loss Eyes: Denies blurriness of vision Ears, nose, mouth, throat, and face: Denies mucositis or sore throat Respiratory: Denies cough, dyspnea or wheezes Cardiovascular: Denies palpitation, chest discomfort or lower extremity swelling Gastrointestinal:  Denies nausea, heartburn or change in bowel habits Skin: Denies abnormal skin rashes Lymphatics: Denies new lymphadenopathy or easy bruising Neurological:Denies numbness, tingling or new weaknesses Behavioral/Psych: Mood is stable, no new changes  All other systems were reviewed with the patient and are negative.  I have reviewed the past medical history, past surgical history, social history and family history with the patient and they are unchanged from previous note.  ALLERGIES:  is allergic to vicodin [hydrocodone-acetaminophen].  MEDICATIONS:  Current Outpatient Medications  Medication Sig Dispense Refill  . acetaminophen (TYLENOL) 500 MG tablet Take 1,000 mg by mouth every 6 (six) hours as needed.    Marland Kitchen amLODipine (NORVASC) 10 MG tablet Take 1 tablet (10 mg total) by mouth daily. 90 tablet 3  . dexamethasone (DECADRON) 4 MG tablet Take 5 tabs at the night before and 5 tab the morning of chemotherapy, every 3 weeks, by mouth 60 tablet 0  . furosemide (LASIX) 40 MG tablet Take 1 tablet (40 mg total) by mouth daily. 30 tablet 0  . hydrALAZINE (APRESOLINE) 10 MG tablet Take 1 tablet (10 mg total) by mouth 3 (three) times daily. 90 tablet 0  . ibuprofen (ADVIL,MOTRIN) 200 MG tablet Take 400 mg by mouth every 6 (six) hours as needed.    . lidocaine-prilocaine (EMLA) cream Apply to affected area once 30 g 3  . magic mouthwash w/lidocaine SOLN Take 5 mLs by mouth 4 (four) times daily as needed for mouth pain. Swish and  spit 140 mL 0  . metoprolol tartrate (LOPRESSOR) 25 MG tablet Take 1 tablet (25 mg total) by mouth 2 (two) times daily. 60 tablet 0  . ondansetron (ZOFRAN) 8 MG tablet Take 1 tablet (8 mg total) by mouth every 8 (eight) hours as needed for refractory nausea / vomiting. Start on day 3 after chemo. 30 tablet 1  . polyethylene glycol (MIRALAX / GLYCOLAX) packet Take 17 g by mouth daily as needed for mild constipation.     . prochlorperazine (COMPAZINE) 10 MG tablet Take 1 tablet (10 mg total) by mouth every 6 (six) hours as needed (Nausea or vomiting). 30 tablet 1  . senna (SENOKOT) 8.6 MG TABS tablet Take 1 tablet by mouth daily.     No current facility-administered medications for this visit.    Facility-Administered Medications Ordered in Other Visits  Medication Dose Route Frequency Provider Last Rate Last Dose  . CARBOplatin (PARAPLATIN) 610 mg in sodium chloride 0.9 % 250 mL chemo infusion  610 mg Intravenous Once Alvy Bimler, Ni, MD 622 mL/hr at 01/25/18 1541 610 mg at 01/25/18 1541  . heparin lock flush 100 unit/mL  500 Units Intracatheter Once PRN Alvy Bimler, Ni, MD      . sodium chloride flush (NS) 0.9 % injection 10 mL  10 mL Intracatheter PRN Alvy Bimler, Ni, MD        PHYSICAL EXAMINATION: ECOG PERFORMANCE STATUS: 1 - Symptomatic but completely ambulatory  Vitals:   01/25/18 8546  BP: (!) 216/116  Pulse: 90  Resp: 17  Temp: 98.3 F (36.8 C)  SpO2: 99%   Filed Weights   01/25/18 0939  Weight: 248 lb 12.8 oz (112.9 kg)    GENERAL:alert, no distress and comfortable SKIN: skin color, texture, turgor are normal, no rashes or significant lesions EYES: normal, Conjunctiva are pink and non-injected, sclera clear OROPHARYNX:no exudate, no erythema and lips, buccal mucosa, and tongue normal  NECK: supple, thyroid normal size, non-tender, without nodularity LYMPH:  no palpable lymphadenopathy in the cervical, axillary or inguinal LUNGS: clear to auscultation and percussion with normal  breathing effort HEART: regular rate & rhythm and no murmurs and no lower extremity edema ABDOMEN:abdomen soft, non-tender and normal bowel sounds.   Musculoskeletal:no cyanosis of digits and no clubbing  NEURO: alert & oriented x 3 with fluent speech, no focal motor/sensory deficits  LABORATORY DATA:  I have reviewed the data as listed    Component Value Date/Time   NA 141 01/25/2018 0802   K 4.0 01/25/2018 0802   CL 105 01/25/2018 0802   CO2 24 01/25/2018 0802   GLUCOSE 177 (H) 01/25/2018 0802   BUN 27 (H) 01/25/2018 0802   CREATININE 1.38 (H) 01/25/2018 0802   CALCIUM 10.7 (H) 01/25/2018 0802   PROT 8.2 (H) 01/25/2018 0802   ALBUMIN 3.9 01/25/2018 0802   AST 16 01/25/2018 0802   ALT 16 01/25/2018 0802   ALKPHOS 126 01/25/2018 0802   BILITOT 0.3 01/25/2018 0802   GFRNONAA 42 (L) 01/25/2018 0802   GFRAA 49 (L) 01/25/2018 0802    No results found for: SPEP, UPEP  Lab Results  Component Value Date   WBC 6.3 01/25/2018   NEUTROABS 4.8 01/25/2018   HGB 11.7 01/25/2018   HCT 34.7 (L) 01/25/2018   MCV 88.2 01/25/2018   PLT 323 01/25/2018      Chemistry      Component Value Date/Time   NA 141 01/25/2018 0802   K 4.0 01/25/2018 0802   CL 105 01/25/2018 0802   CO2 24 01/25/2018 0802   BUN 27 (H) 01/25/2018 0802   CREATININE 1.38 (H) 01/25/2018 0802      Component Value Date/Time   CALCIUM 10.7 (H) 01/25/2018 0802   ALKPHOS 126 01/25/2018 0802   AST 16 01/25/2018 0802   ALT 16 01/25/2018 0802   BILITOT 0.3 01/25/2018 0802       All questions were answered. The patient knows to call the clinic with any problems, questions or concerns. No barriers to learning was detected.  I spent 15 minutes counseling the patient face to face. The total time spent in the appointment was 20 minutes and more than 50% was on counseling and review of test results  Heath Lark, MD 01/25/2018 3:52 PM

## 2018-01-25 NOTE — Progress Notes (Signed)
Okay to treat with blood pressure of 207/111, per Dr. Alvy Bimler. Give Clonidine .2 mg po prior to treatment.

## 2018-01-25 NOTE — Assessment & Plan Note (Signed)
She has severe, uncontrolled hypertension She has not been compliant with low-salt diet We had extensive discussion about dietary modification and return appointment to see nephrologist and cardiologist for medication adjustment of her blood pressure medications Today, we will give her extra dose of clonidine during treatment

## 2018-01-25 NOTE — Assessment & Plan Note (Signed)
The patient has no signs of significant edema today We discussed importance of close follow-up with primary care doctor and cardiologist

## 2018-01-25 NOTE — Assessment & Plan Note (Signed)
Her kidney function is stable We will adjust chemotherapy accordingly

## 2018-01-25 NOTE — Assessment & Plan Note (Signed)
She tolerated cycle 1 of treatment poorly due to diffuse bone pain which I think is exacerbated by Taxol With dose adjustment, she tolerated treatment better I will see her back next cycle for further toxicity review

## 2018-01-26 ENCOUNTER — Telehealth: Payer: Self-pay | Admitting: *Deleted

## 2018-01-26 LAB — URINE CULTURE: CULTURE: NO GROWTH

## 2018-01-26 NOTE — Telephone Encounter (Signed)
CALLED PATIENT TO INFORM THAT HDR TX. FOR 01-31-18 HAS BEEN CANCELLED DUE TO DR. KINARD BEING ON VACATION, 01-31-18 HDR [place] TO 02-21-18, PATIENT VERIFIED UNDERSTANDING THIS

## 2018-01-29 ENCOUNTER — Telehealth: Payer: Self-pay

## 2018-01-29 NOTE — Telephone Encounter (Signed)
LM stating that the the urine culture from 01-26-18 showed no UTI per Joylene John, NP.

## 2018-01-31 ENCOUNTER — Ambulatory Visit: Payer: PRIVATE HEALTH INSURANCE | Admitting: Radiation Oncology

## 2018-02-01 ENCOUNTER — Ambulatory Visit: Payer: PRIVATE HEALTH INSURANCE | Admitting: Radiation Oncology

## 2018-02-06 ENCOUNTER — Telehealth: Payer: Self-pay | Admitting: *Deleted

## 2018-02-06 NOTE — Telephone Encounter (Signed)
CALLED PATIENT TO REMIND OF HDR TX. FOR 02-07-18 @ 10 AM, LVM FOR A RETURN CALL

## 2018-02-07 ENCOUNTER — Other Ambulatory Visit: Payer: Self-pay

## 2018-02-07 ENCOUNTER — Ambulatory Visit
Admission: RE | Admit: 2018-02-07 | Discharge: 2018-02-07 | Disposition: A | Discharge status: Home / Self Care | Payer: PRIVATE HEALTH INSURANCE | Source: Ambulatory Visit | Attending: Radiation Oncology | Admitting: Radiation Oncology

## 2018-02-07 ENCOUNTER — Other Ambulatory Visit: Payer: Self-pay | Admitting: Radiation Oncology

## 2018-02-07 DIAGNOSIS — C541 Malignant neoplasm of endometrium: Secondary | ICD-10-CM

## 2018-02-07 MED ORDER — MAGIC MOUTHWASH W/LIDOCAINE: 5.0000 mL | Freq: Four times a day (QID) | ORAL | 0 refills | Status: DC | PRN

## 2018-02-07 MED FILL — CMPD MCO MMW 3:1 VISC LIDO: 7 days supply | Qty: 140 | Fill #0

## 2018-02-07 NOTE — Progress Notes (Signed)
  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 She will be treated with a 3.0 cm diameter segmented cylinder. This conforms to her anatomy without undue discomfort.  Vaginal brachytherapy procedure node: The patient was brought to the [place] suite. Identity was confirmed. All relevant records and images related to the planned course of therapy were reviewed. The patient freely provided informed written consent to proceed with treatment after reviewing the details related to the planned course of therapy. The consent form was witnessed and verified by the simulation staff. Then, the patient was set-up in a stable reproducible supine position for radiation therapy. Pelvic exam revealed the vaginal cuff to be intact . The patient's custom vaginal cylinder was placed in the proximal vagina. This was affixed to the CT/MR stabilization plate to prevent slippage. Patient tolerated the placement well.  Verification simulation note:  A fiducial marker was placed within the vaginal cylinder. An AP and lateral film was then obtained through the pelvis area. This documented accurate position of the vaginal cylinder for treatment.  HDR BRACHYTHERAPY TREATMENT  The remote afterloading device was affixed to the vaginal cylinder by catheter. Patient then proceeded to undergo her third high-dose-rate treatment directed at the proximal vagina. The patient was prescribed a dose of 6.0 gray to be delivered to the mucosal surface. Treatment length was 3.5 cm. Patient was treated with 1 channel using 8 dwell positions. Treatment time was 284.10 seconds. Iridium 192 was the  high-dose-rate source for treatment. The patient tolerated the treatment well. After completion of her therapy, a radiation survey was performed documenting return of the iridium source into the [employer] safe.   PLAN: The patient will return next week for her fourth high-dose-rate treatment. Patient continues to have problems with mucositis since her last chemotherapy. Her mouth is quite sore and I've refilled her Magic mouthwash/Viscous Xylocaine prescription. ________________________________   Blair Promise, PhD, MD   This document serves as a record of services personally performed by Gery Pray, MD. It was created on his behalf by [facility], a trained medical scribe. The creation of this record is based on the scribe's personal observations and the provider's statements to them. This document has been checked and approved by the attending provider.

## 2018-02-12 ENCOUNTER — Other Ambulatory Visit: Payer: Self-pay | Admitting: Hematology and Oncology

## 2018-02-12 MED FILL — METOPROLOL TARTRATE 25 MG T: 25 | 30 days supply | Qty: 60 | Fill #0

## 2018-02-13 ENCOUNTER — Telehealth: Payer: Self-pay | Admitting: *Deleted

## 2018-02-13 NOTE — Telephone Encounter (Signed)
Called patient to remind of HDR Tx. For 02-14-18 @ 10 am, lvm for a return call

## 2018-02-14 ENCOUNTER — Ambulatory Visit (HOSPITAL_COMMUNITY)
Admission: RE | Admit: 2018-02-14 | Discharge: 2018-02-14 | Disposition: A | Discharge status: Home / Self Care | Payer: PRIVATE HEALTH INSURANCE | Source: Ambulatory Visit | Attending: Radiation Oncology | Admitting: Radiation Oncology

## 2018-02-14 ENCOUNTER — Ambulatory Visit
Admission: RE | Admit: 2018-02-14 | Discharge: 2018-02-14 | Disposition: A | Discharge status: Home / Self Care | Payer: Self-pay | Source: Ambulatory Visit | Attending: Radiation Oncology | Admitting: Radiation Oncology

## 2018-02-14 ENCOUNTER — Other Ambulatory Visit: Payer: Self-pay | Admitting: Radiation Oncology

## 2018-02-14 ENCOUNTER — Telehealth: Payer: Self-pay | Admitting: *Deleted

## 2018-02-14 ENCOUNTER — Other Ambulatory Visit: Payer: Self-pay | Admitting: Hematology and Oncology

## 2018-02-14 VITALS — BP 166/112 | HR 80 | Temp 98.1°F | Resp 18

## 2018-02-14 DIAGNOSIS — C541 Malignant neoplasm of endometrium: Secondary | ICD-10-CM | POA: Insufficient documentation

## 2018-02-14 MED FILL — hydrALAZINE HCL 10 MG TABS: 10 | 30 days supply | Qty: 90 | Fill #0

## 2018-02-14 NOTE — Progress Notes (Signed)
Right lower extremity venous duplex completed - Preliminary results = There is no evidence of DVT or Baker's cyst. Angela Gardner 02/14/2018 2:32 PM

## 2018-02-14 NOTE — Progress Notes (Signed)
Pt presents to clinic after HDR treatment. Pt with c/o pain in RIGHT leg. Pt states pain extends from buttocks to mid calf. Pt states she feels that RIGHT leg is swollen. Pt reports that pain started at approximately midnight last night. Pt reports that she "sleeps wild" in the bed and that "it feels like I pulled a muscle". Pt c/o SOB that began at approximately the same time and pt last experienced SOB when pt walked to the car this am to come to HDR treatment. Pt reports feeling lightheaded since pain started with rare "floaters" in vision. Pt states she is ambulating without difficulty. Pt states she had a headache yesterday, Monday, but headache has now resolved.   Pt also out of Metoprolol. Last dose was Sunday. Pt stated her BP was "low" for her. 166/112.   Dr. Sondra Come in to examine pt. Loma Sousa, RN BSN

## 2018-02-14 NOTE — Progress Notes (Signed)
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 She will be treated with a 3.0 cm diameter segmented cylinder. This conforms to her anatomy without undue discomfort.  Vaginal brachytherapy procedure node: The patient was brought to the [place] suite. Identity was confirmed. All relevant records and images related to the planned course of therapy were reviewed. The patient freely provided informed written consent to proceed with treatment after reviewing the details related to the planned course of therapy. The consent form was witnessed and verified by the simulation staff. Then, the patient was set-up in a stable reproducible supine position for radiation therapy. Pelvic exam revealed the vaginal cuff to be intact . The patient's custom vaginal cylinder was placed in the proximal vagina. This was affixed to the CT/MR stabilization plate to prevent slippage. Patient tolerated the placement well.  Verification simulation note:  A fiducial marker was placed within the vaginal cylinder. An AP and lateral film was then obtained through the pelvis area. This documented accurate position of the vaginal cylinder for treatment.  HDR BRACHYTHERAPY TREATMENT  The remote afterloading device was affixed to the vaginal cylinder by catheter. Patient then proceeded to undergo her fourth high-dose-rate treatment directed at the proximal vagina. The patient was prescribed a dose of 6.0 gray to be delivered to the mucosal surface. Treatment length was 3.5 cm. Patient was treated with 1 channel using 8 dwell positions. Treatment time was 303.40 seconds. Iridium 192 was the  high-dose-rate source for treatment. The patient tolerated the treatment well. After completion of her therapy, a radiation survey was performed documenting return of the iridium source into the [employer] safe.    HPI:  While the patient was on the table for her treatment, she noted that she had some SOB as well as right lower leg swelling since last night. She notes associated right leg swelling, resolved SOB, and right posterior thigh pain. She has never been on oxygen in the past. She hasn't tried any medications for her symptoms. She denies left thigh pain and any other symptoms. She stopped taking her heart medications with her last dose being 4 days ago. She will be picking up her medications later today.   Physical Exam Lungs are clear to auscultation bilaterally. Heart has regular rate and rhythm. No palpable cervical, supraclavicular, or axillary adenopathy.  Extremities: Tenderness with palpation in the right posterior thigh. possibly some mild swelling in right leg, but difficult to determine.   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 In light of these issues, we will get her scheduled for doppler of RLE to rule out DVT.   PLAN: DVT Doppler Study to be ordered today to rule out DVT to RLE. The patient will return next week for her fifth high-dose-rate treatment.  ________________________________   Angela Promise, PhD, MD   This document serves as a record of services personally performed by Gery Pray, MD. It was created on his behalf by [employer], a trained medical scribe.  The creation of this record is based on the scribe's personal observations and the provider's statements to them. This document has been checked and approved by the attending  provider.   Addendum: Doppler negative for DVT or Baker's cyst along the right leg.

## 2018-02-14 NOTE — Telephone Encounter (Signed)
Called patient to inform of doppler study on 02-14-18 - arrival time - 1:45 pm @ [facility] Admitting, doppler to @ 2 pm @ [facility] Cardiopulmonary, spoke with patient and she is aware of this test

## 2018-02-15 ENCOUNTER — Telehealth: Payer: Self-pay | Admitting: Hematology and Oncology

## 2018-02-15 ENCOUNTER — Other Ambulatory Visit: Payer: Self-pay | Admitting: Hematology and Oncology

## 2018-02-15 ENCOUNTER — Inpatient Hospital Stay (HOSPITAL_BASED_OUTPATIENT_CLINIC_OR_DEPARTMENT_OTHER): Payer: Medicaid Other | Admitting: Hematology and Oncology

## 2018-02-15 ENCOUNTER — Encounter: Payer: Self-pay | Admitting: Hematology and Oncology

## 2018-02-15 ENCOUNTER — Inpatient Hospital Stay: Payer: Medicaid Other

## 2018-02-15 ENCOUNTER — Encounter: Payer: Self-pay | Admitting: Oncology

## 2018-02-15 ENCOUNTER — Telehealth: Payer: Self-pay

## 2018-02-15 VITALS — BP 166/89

## 2018-02-15 VITALS — BP 202/106 | HR 79 | Temp 98.2°F | Resp 18 | Ht 64.0 in | Wt 244.6 lb

## 2018-02-15 DIAGNOSIS — T451X5A Adverse effect of antineoplastic and immunosuppressive drugs, initial encounter: Secondary | ICD-10-CM

## 2018-02-15 DIAGNOSIS — M7918 Myalgia, other site: Secondary | ICD-10-CM

## 2018-02-15 DIAGNOSIS — I10 Essential (primary) hypertension: Secondary | ICD-10-CM

## 2018-02-15 DIAGNOSIS — E559 Vitamin D deficiency, unspecified: Secondary | ICD-10-CM

## 2018-02-15 DIAGNOSIS — C541 Malignant neoplasm of endometrium: Secondary | ICD-10-CM

## 2018-02-15 DIAGNOSIS — G620 Drug-induced polyneuropathy: Secondary | ICD-10-CM

## 2018-02-15 DIAGNOSIS — I130 Hypertensive heart and chronic kidney disease with heart failure and stage 1 through stage 4 chronic kidney disease, or unspecified chronic kidney disease: Secondary | ICD-10-CM

## 2018-02-15 DIAGNOSIS — R52 Pain, unspecified: Secondary | ICD-10-CM

## 2018-02-15 DIAGNOSIS — I5030 Unspecified diastolic (congestive) heart failure: Secondary | ICD-10-CM

## 2018-02-15 DIAGNOSIS — N183 Chronic kidney disease, stage 3 unspecified: Secondary | ICD-10-CM

## 2018-02-15 DIAGNOSIS — Z5111 Encounter for antineoplastic chemotherapy: Secondary | ICD-10-CM | POA: Diagnosis not present

## 2018-02-15 DIAGNOSIS — F1911 Other psychoactive substance abuse, in remission: Secondary | ICD-10-CM

## 2018-02-15 HISTORY — DX: Vitamin D deficiency, unspecified: E55.9

## 2018-02-15 LAB — CBC WITH DIFFERENTIAL (CANCER CENTER ONLY)
BASOS ABS: 0 10*3/uL (ref 0.0–0.1)
BASOS PCT: 0 %
EOS PCT: 0 %
Eosinophils Absolute: 0 10*3/uL (ref 0.0–0.5)
HCT: 34.6 % — ABNORMAL LOW (ref 34.8–46.6)
Hemoglobin: 11.6 g/dL (ref 11.6–15.9)
Lymphocytes Relative: 29 %
Lymphs Abs: 1 10*3/uL (ref 0.9–3.3)
MCH: 30 pg (ref 25.1–34.0)
MCHC: 33.5 g/dL (ref 31.5–36.0)
MCV: 89.4 fL (ref 79.5–101.0)
MONO ABS: 0.1 10*3/uL (ref 0.1–0.9)
MONOS PCT: 2 %
NEUTROS ABS: 2.5 10*3/uL (ref 1.5–6.5)
Neutrophils Relative %: 69 %
Platelet Count: 222 10*3/uL (ref 145–400)
RBC: 3.87 MIL/uL (ref 3.70–5.45)
RDW: 16.5 % — AB (ref 11.2–14.5)
WBC Count: 3.5 10*3/uL — ABNORMAL LOW (ref 3.9–10.3)

## 2018-02-15 LAB — CMP (CANCER CENTER ONLY)
ALBUMIN: 4 g/dL (ref 3.5–5.0)
ALT: 12 U/L (ref 0–44)
ANION GAP: 13 (ref 5–15)
AST: 15 U/L (ref 15–41)
Alkaline Phosphatase: 111 U/L (ref 38–126)
BUN: 27 mg/dL — AB (ref 6–20)
CHLORIDE: 105 mmol/L (ref 98–111)
CO2: 24 mmol/L (ref 22–32)
Calcium: 10.1 mg/dL (ref 8.9–10.3)
Creatinine: 1.3 mg/dL — ABNORMAL HIGH (ref 0.44–1.00)
GFR, Est AFR Am: 53 mL/min — ABNORMAL LOW (ref >60)
GFR, Estimated: 45 mL/min — ABNORMAL LOW (ref >60)
GLUCOSE: 146 mg/dL — AB (ref 70–99)
POTASSIUM: 3.8 mmol/L (ref 3.5–5.1)
Sodium: 142 mmol/L (ref 135–145)
TOTAL PROTEIN: 8.1 g/dL (ref 6.5–8.1)
Total Bilirubin: 0.3 mg/dL (ref 0.3–1.2)

## 2018-02-15 MED ORDER — FAMOTIDINE IN NACL 20-0.9 MG/50ML-% IV SOLN
20.0000 mg | Freq: Once | INTRAVENOUS | Status: AC
  Administered 2018-02-15: 20 mg via INTRAVENOUS

## 2018-02-15 MED ORDER — CLONIDINE HCL 0.1 MG PO TABS
ORAL_TABLET | ORAL | Status: AC
  Filled 2018-02-15: qty 1

## 2018-02-15 MED ORDER — FAMOTIDINE IN NACL 20-0.9 MG/50ML-% IV SOLN
INTRAVENOUS | Status: AC
  Filled 2018-02-15: qty 50

## 2018-02-15 MED ORDER — HEPARIN SOD (PORK) LOCK FLUSH 100 UNIT/ML IV SOLN
500.0000 [IU] | Freq: Once | INTRAVENOUS | Status: AC | PRN
  Administered 2018-02-15: 500 [IU]
  Filled 2018-02-15: qty 5

## 2018-02-15 MED ORDER — PALONOSETRON HCL INJECTION 0.25 MG/5ML
INTRAVENOUS | Status: AC
  Filled 2018-02-15: qty 5

## 2018-02-15 MED ORDER — DIPHENHYDRAMINE HCL 50 MG/ML IJ SOLN
INTRAMUSCULAR | Status: AC
  Filled 2018-02-15: qty 1

## 2018-02-15 MED ORDER — SODIUM CHLORIDE 0.9 % IV SOLN
Freq: Once | INTRAVENOUS | Status: AC
  Administered 2018-02-15: 11:00:00 via INTRAVENOUS
  Filled 2018-02-15: qty 250

## 2018-02-15 MED ORDER — SODIUM CHLORIDE 0.9% FLUSH
10.0000 mL | INTRAVENOUS | Status: DC | PRN
  Administered 2018-02-15: 10 mL
  Filled 2018-02-15: qty 10

## 2018-02-15 MED ORDER — SODIUM CHLORIDE 0.9 % IV SOLN
600.0000 mg | Freq: Once | INTRAVENOUS | Status: AC
  Administered 2018-02-15: 600 mg via INTRAVENOUS
  Filled 2018-02-15: qty 60

## 2018-02-15 MED ORDER — SODIUM CHLORIDE 0.9 % IV SOLN
116.6667 mg/m2 | Freq: Once | INTRAVENOUS | Status: AC
  Administered 2018-02-15: 264 mg via INTRAVENOUS
  Filled 2018-02-15: qty 44

## 2018-02-15 MED ORDER — DIPHENHYDRAMINE HCL 50 MG/ML IJ SOLN
50.0000 mg | Freq: Once | INTRAMUSCULAR | Status: AC
  Administered 2018-02-15: 50 mg via INTRAVENOUS

## 2018-02-15 MED ORDER — PALONOSETRON HCL INJECTION 0.25 MG/5ML
0.2500 mg | Freq: Once | INTRAVENOUS | Status: AC
  Administered 2018-02-15: 0.25 mg via INTRAVENOUS

## 2018-02-15 MED ORDER — SODIUM CHLORIDE 0.9 % IV SOLN
Freq: Once | INTRAVENOUS | Status: AC
  Administered 2018-02-15: 12:00:00 via INTRAVENOUS
  Filled 2018-02-15: qty 5

## 2018-02-15 MED ORDER — CLONIDINE HCL 0.1 MG PO TABS
0.1000 mg | ORAL_TABLET | Freq: Once | ORAL | Status: AC
  Administered 2018-02-15: 0.1 mg via ORAL

## 2018-02-15 NOTE — Assessment & Plan Note (Signed)
She has diffuse nonspecific pain which I do not think is related to chemotherapy Previously, she was found to have illicit drug use I do not plan to prescribe narcotic prescription due to high risk of abuse and dependency I recommend physical therapy and rehab I plan to check serum vitamin D level.  If that comes back low, it could explain part of her musculoskeletal pain and I will prescribe vitamin D replacement therapy

## 2018-02-15 NOTE — Assessment & Plan Note (Signed)
She has significant fluctuation of kidney function This is due to uncontrolled hypertension I plan to keep carboplatin at around 600 mg for now

## 2018-02-15 NOTE — Telephone Encounter (Signed)
Called to verify that they can see the referral placed today. They can see it.

## 2018-02-15 NOTE — Telephone Encounter (Signed)
Gave avs and calendar ° °

## 2018-02-15 NOTE — Patient Instructions (Signed)
[facility] Discharge Instructions for Patients Receiving Chemotherapy  Today you received the following chemotherapy agents: Taxol & Carboplatin.  To help prevent nausea and vomiting after your treatment, we encourage you to take your nausea medication as prescribed.  Do not take zofran for three days following your infusion, take compazine instead.   If you develop nausea and vomiting that is not controlled by your nausea medication, call the clinic.   BELOW ARE SYMPTOMS THAT SHOULD BE REPORTED IMMEDIATELY:  *FEVER GREATER THAN 100.5 F  *CHILLS WITH OR WITHOUT FEVER  NAUSEA AND VOMITING THAT IS NOT CONTROLLED WITH YOUR NAUSEA MEDICATION  *UNUSUAL SHORTNESS OF BREATH  *UNUSUAL BRUISING OR BLEEDING  TENDERNESS IN MOUTH AND THROAT WITH OR WITHOUT PRESENCE OF ULCERS  *URINARY PROBLEMS  *BOWEL PROBLEMS  UNUSUAL RASH Items with * indicate a potential emergency and should be followed up as soon as possible.  Feel free to call the clinic should you have any questions or concerns. The clinic phone number is [phone].  Please show the CHEMO ALERT CARD at check-in to the Emergency Department and triage nurse. 

## 2018-02-15 NOTE — Assessment & Plan Note (Signed)
She has uncontrolled hypertension due to her medications running out and has not been refilled Previously, we had extensive discussion about the importance of getting her blood pressure under control On review of her medication list, it appears that she is not taking all her medications as prescribed I recommend she bring all her pill bottles with her in her next visit I will give her 1 dose of clonidine during treatment today

## 2018-02-15 NOTE — Progress Notes (Signed)
[place] OFFICE PROGRESS NOTE  Patient Care Team: Charlott Rakes, MD as PCP - General (Family Medicine)  ASSESSMENT & PLAN:  Endometrial cancer [facility]) She tolerated treatment well except for other major comorbidities including diffuse nonspecific pain, weakness and uncontrolled hypertension I plan minor dose adjustment due to multiple side effects I will keep carboplatin at 600 mg for now due to fluctuation of kidney function  Chronic kidney disease (CKD), stage III (moderate) (HCC) She has significant fluctuation of kidney function This is due to uncontrolled hypertension I plan to keep carboplatin at around 600 mg for now  Essential hypertension She has uncontrolled hypertension due to her medications running out and has not been refilled Previously, we had extensive discussion about the importance of getting her blood pressure under control On review of her medication list, it appears that she is not taking all her medications as prescribed I recommend she bring all her pill bottles with her in her next visit I will give her 1 dose of clonidine during treatment today  Diffuse pain She has diffuse nonspecific pain which I do not think is related to chemotherapy Previously, she was found to have illicit drug use I do not plan to prescribe narcotic prescription due to high risk of abuse and dependency I recommend physical therapy and rehab I plan to check serum vitamin D level.  If that comes back low, it could explain part of her musculoskeletal pain and I will prescribe vitamin D replacement therapy   Orders Placed This Encounter  Procedures  . VITAMIN D 25 Hydroxy (Vit-D Deficiency, Fractures)    Standing Status:   Future    Number of Occurrences:   1    Standing Expiration Date:   02/16/2019  . Ambulatory referral to Physical Therapy    Referral Priority:   Routine    Referral Type:   Physical Medicine    Referral Reason:   Specialty Services Required     Requested Specialty:   Physical Therapy    Number of Visits Requested:   1    INTERVAL HISTORY: Please see below for problem oriented charting. She returns with her sister for further follow-up She ran out of 1 of her blood pressure medications recently and did not have it refilled Her blood pressure is grossly elevated today She denies headache She complained of severe diffuse pain in her legs which are nonspecific When I asked whether it is related to her bones, muscle, neuropathy or others, she was not able to describe further Because of her pain, she did not move around much She is not using her cane She complained of weakness No recent nausea, vomiting or constipation Denies recent fever or chills  SUMMARY OF ONCOLOGIC HISTORY: Oncology History   High grade serous with sarcomatoid features MSI low     Endometrial cancer ([place])   05/21/2015 Imaging    US pelvis 1. Severe endometrial thickening at 35.2 mm. Large echogenic focus noted within the endometrial canal measuring 4.7 x 1.7 x 3.0 cm.  Endometrial fluid noted. These findings are suspicious for endometrial carcinoma .  2. Associated uterine enlargement. Two probable small fibroids are noted along the posterior fundus. The largest measures 3.7 cm.  3.  Ovaries not visualized.    05/21/2015 Initial Diagnosis    She presented with postmenopausal bleeding    06/15/2015 Pathology Results    Endometrium, biopsy - FRAGMENTED INACTIVE ENDOMETRIUM. - NO ATYPIA OR MALIGNANCY    09/13/2017 Imaging  Normal LV size with severe LV hypertrophy. EF 55%. Moderate diastolic dysfunction. Normal RV size and systolic function. No significant valvular abnormalities.    09/13/2017 Imaging    1. No acute intrathoracic, abdominal, or pelvic pathology. No aortic aneurysm or dissection. No CT evidence of pulmonary embolus. 2. Cardiomegaly with left ventricular hypertrophy    09/15/2017 Imaging    US pelvis Concern for large echogenic  mass within the endometrium measuring 3.8 x 3.8 x 3.6 cm. Recommend further evaluation with sonohysterogram or hysteroscopy/biopsy.  3.8 cm fundal subserosal fibroid.     10/09/2017 Pathology Results    Endometrium, biopsy - HIGH GRADE ADENOCARCINOMA. - SEE MICROSCOPIC DESCRIPTION. Microscopic Comment There is abundant secretory type endometrium and scattered fragments of high grade adenocarcinoma, which focally has features suggestive of high grade serous carcinoma. There is also a microscopic focus with high grade carcinoma and associated spindled cell component. In this small focus a sarcomatous component cannot be ruled out; however, carcinoma with sarcomatoid features is favored.    11/14/2017 Pathology Results    1. Lymph node, sentinel, biopsy, left external iliac - ONE BENIGN LYMPH NODE (0/1). 2. Lymph nodes, regional resection, right pelvic - FIVE BENIGN LYMPH NODES (0/5). 3. 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 - CARCINOMA INVADES 0.5 CM IN A 3 CM MYOMETRIUM. - CERVIX, BILATERAL OVARIES AND BILATERAL FALLOPIAN TUBES FREE OF TUMOR. CERVIX: - NABOTHIAN CYSTS. - NO DYSPLASIA OR MALIGNANCY. MYOMETRIUM: - ADENOMYOSIS. - LEIOMYOMATA. RIGHT AND LEFT OVARIES: - UNREMARKABLE. - NO ENDOMETRIOSIS OR MALIGNANCY. RIGHT FALLOPIAN TUBE: - PREVIOUS TUBAL LIGATION. - NO ENDOMETRIOSIS OR MALIGNANCY. LEFT FALLOPIAN TUBE: - BENIGN ADENOMATOID TUMOR, 0.8 CM. - PREVIOUS TUBAL LIGATION. - NO ENDOMETRIOSIS OR MALIGNANCY. Microscopic Comment 3. UTERUS: Procedure: Hysterectomy with bilateral salpingo-oophorectomy and left external iliac and pelvic lymph nodes. Specimen Integrity: 1.5 cm irregular defect in the endocervical canal  Tumor Size: 2.3 cm. 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 Histologic Grade (required only for adenosarcoma):  High grade, grade III Myometrial Invasion (required only for adenosarcoma): 0.5 cm in a 3 cm thick myometrium. Other Tissue/ Organ Involvement: No. Margins: Free of tumor. Lymphovascular Invasion: Not identified. Regional Lymph Nodes: No lymph nodes submitted or found: 6 All lymph nodes negative for tumor cells Number of Lymph Nodes Examined: 6 Pathologic Stage Classification (pTNM, AJCC 8th Edition): pT1a, pN0 Ancillary Studies: Immunohistochemistry for cytokeratin AE1/AE3, cytokeratin 8/18, cytokeratin 903, cytokeratin 5/6 and p53. Representative Tumor Block: 3E, 38F, 3H, 3I, and 3J. Comment(s): The tumor is partially polypoid, is 2.3 cm in greatest dimension and has features of a high grade serous carcinoma. There is also an undifferentiated component with sarcomatoid features and immunohistochemistry shows positivity with cytokeratin 8/18 and is negative with cytokeratin AE1/AE3, cytokeratin 5/6, cytokeratin 903 and p53. The immunophenotype is most consistent with high grade carcinoma with sarcomatoid features. Immunohistochemistry for cytokeratin AE1/AE3 is performed on all of the lymph nodes (parts 1 and 2) and no positivity is identified.    11/14/2017 Surgery    Assistants: Dr Lahoma Crocker (an MD assistant was necessary for tissue manipulation, management of robotic instrumentation, retraction and positioning due to the complexity of the case and hospital policies).  Operation: Robotic-assisted laparoscopic total hysterectomy with bilateral salpingoophorectomy, SLN biopsy, right pelvic lymphadenectomy  Surgeon: Donaciano Eva  Operative Findings:  : 12cm bulky broad fibroid uterus. Pelvic adnexal adhesions between right ovary and fallopian tube and pelvic sidewall. Bladder  adhesions from prior cesarean section. Grossly normal appearing ovaries. No apparent extrauterine disease. Multiple subserosal fibroids. Unilateral dye mapping to left. No suspicious lymph nodes.    Estimated Blood Loss:  less than 50 mL      Total IV Fluids: 800 ml         Specimens: uterus, cervix, bilateral tubes and ovaries, right pelvic lymph nodes, left external iliac sentinel lymph node.          Complications:  None; patient tolerated the procedure well.      11/16/2017 Imaging    CT head No acute intracranial abnormalities to explain the patient's symptoms.    11/24/2017 Cancer Staging    Staging form: Corpus Uteri - Carcinoma and Carcinosarcoma, AJCC 8th Edition - Pathologic: Stage IA (pT1a, pN0, cM0) - Signed by Heath Lark, MD on 11/24/2017    12/06/2017 Procedure    Status post right IJ port catheter placement. Catheter ready for use    12/14/2017 -  Chemotherapy    The patient had carboplatin and taxol    12/22/2017 Imaging    1. No convincing evidence of fistula between the lower urinary tract and vagina. 2. There is a small low-attenuation collection in the pelvic cul-de-sac adjacent to the vaginal cuff measuring 2.6 x 1.9 x 2.0 cm (volume = 5.2 cm^3). This may represent a small amount of ascites or postoperative hematoma/seroma and could represent a potential source for vaginal discharge.      REVIEW OF SYSTEMS:   Constitutional: Denies fevers, chills or abnormal weight loss Eyes: Denies blurriness of vision Ears, nose, mouth, throat, and face: Denies mucositis or sore throat Respiratory: Denies cough, dyspnea or wheezes Cardiovascular: Denies palpitation, chest discomfort or lower extremity swelling Gastrointestinal:  Denies nausea, heartburn or change in bowel habits Skin: Denies abnormal skin rashes Lymphatics: Denies new lymphadenopathy or easy bruising Neurological:Denies numbness, tingling or new weaknesses Behavioral/Psych: Mood is stable, no new changes  All other systems were reviewed with the patient and are negative.  I have reviewed the past medical history, past surgical history, social history and family history with the patient and they  are unchanged from previous note.  ALLERGIES:  is allergic to vicodin [hydrocodone-acetaminophen].  MEDICATIONS:  Current Outpatient Medications  Medication Sig Dispense Refill  . acetaminophen (TYLENOL) 500 MG tablet Take 1,000 mg by mouth every 6 (six) hours as needed.    Marland Kitchen amLODipine (NORVASC) 10 MG tablet Take 1 tablet (10 mg total) by mouth daily. 90 tablet 3  . dexamethasone (DECADRON) 4 MG tablet Take 5 tabs at the night before and 5 tab the morning of chemotherapy, every 3 weeks, by mouth 60 tablet 0  . furosemide (LASIX) 40 MG tablet Take 1 tablet (40 mg total) by mouth daily. 30 tablet 0  . hydrALAZINE (APRESOLINE) 10 MG tablet TAKE 1 TABLET (10 MG TOTAL) BY MOUTH 3 (THREE) TIMES DAILY. 90 tablet 0  . ibuprofen (ADVIL,MOTRIN) 200 MG tablet Take 400 mg by mouth every 6 (six) hours as needed.    . lidocaine-prilocaine (EMLA) cream Apply to affected area once 30 g 3  . magic mouthwash w/lidocaine SOLN Take 5 mLs by mouth 4 (four) times daily as needed for mouth pain. Swish and spit 140 mL 0  . metoprolol tartrate (LOPRESSOR) 25 MG tablet TAKE 1 TABLET (25 MG TOTAL) BY MOUTH 2 (TWO) TIMES DAILY. 60 tablet 0  . ondansetron (ZOFRAN) 8 MG tablet Take 1 tablet (8 mg total) by mouth every 8 (eight) hours as  needed for refractory nausea / vomiting. Start on day 3 after chemo. 30 tablet 1  . polyethylene glycol (MIRALAX / GLYCOLAX) packet Take 17 g by mouth daily as needed for mild constipation.     . prochlorperazine (COMPAZINE) 10 MG tablet Take 1 tablet (10 mg total) by mouth every 6 (six) hours as needed (Nausea or vomiting). 30 tablet 1  . senna (SENOKOT) 8.6 MG TABS tablet Take 1 tablet by mouth daily.     No current facility-administered medications for this visit.    Facility-Administered Medications Ordered in Other Visits  Medication Dose Route Frequency Provider Last Rate Last Dose  . CARBOplatin (PARAPLATIN) 600 mg in sodium chloride 0.9 % 250 mL chemo infusion  600 mg Intravenous  Once Heath Lark, MD 620 mL/hr at 02/15/18 1558 600 mg at 02/15/18 1558  . heparin lock flush 100 unit/mL  500 Units Intracatheter Once PRN Alvy Bimler, Navika Hoopes, MD      . sodium chloride flush (NS) 0.9 % injection 10 mL  10 mL Intracatheter PRN Alvy Bimler, Kandice Schmelter, MD        PHYSICAL EXAMINATION: ECOG PERFORMANCE STATUS: 1 - Symptomatic but completely ambulatory  Vitals:   02/15/18 0951  BP: (!) 202/106  Pulse: 79  Resp: 18  Temp: 98.2 F (36.8 C)  SpO2: 99%   Filed Weights   02/15/18 0951  Weight: 244 lb 9.6 oz (110.9 kg)    GENERAL:alert, no distress and comfortable SKIN: skin color, texture, turgor are normal, no rashes or significant lesions EYES: normal, Conjunctiva are pink and non-injected, sclera clear OROPHARYNX:no exudate, no erythema and lips, buccal mucosa, and tongue normal  NECK: supple, thyroid normal size, non-tender, without nodularity LYMPH:  no palpable lymphadenopathy in the cervical, axillary or inguinal LUNGS: clear to auscultation and percussion with normal breathing effort HEART: regular rate & rhythm and no murmurs and no lower extremity edema ABDOMEN:abdomen soft, non-tender and normal bowel sounds Musculoskeletal:no cyanosis of digits and no clubbing  NEURO: alert & oriented x 3 with fluent speech, no focal motor/sensory deficits  LABORATORY DATA:  I have reviewed the data as listed    Component Value Date/Time   NA 142 02/15/2018 0857   K 3.8 02/15/2018 0857   CL 105 02/15/2018 0857   CO2 24 02/15/2018 0857   GLUCOSE 146 (H) 02/15/2018 0857   BUN 27 (H) 02/15/2018 0857   CREATININE 1.30 (H) 02/15/2018 0857   CALCIUM 10.1 02/15/2018 0857   PROT 8.1 02/15/2018 0857   ALBUMIN 4.0 02/15/2018 0857   AST 15 02/15/2018 0857   ALT 12 02/15/2018 0857   ALKPHOS 111 02/15/2018 0857   BILITOT 0.3 02/15/2018 0857   GFRNONAA 45 (L) 02/15/2018 0857   GFRAA 53 (L) 02/15/2018 0857    No results found for: SPEP, UPEP  Lab Results  Component Value Date   WBC 3.5  (L) 02/15/2018   NEUTROABS 2.5 02/15/2018   HGB 11.6 02/15/2018   HCT 34.6 (L) 02/15/2018   MCV 89.4 02/15/2018   PLT 222 02/15/2018      Chemistry      Component Value Date/Time   NA 142 02/15/2018 0857   K 3.8 02/15/2018 0857   CL 105 02/15/2018 0857   CO2 24 02/15/2018 0857   BUN 27 (H) 02/15/2018 0857   CREATININE 1.30 (H) 02/15/2018 0857      Component Value Date/Time   CALCIUM 10.1 02/15/2018 0857   ALKPHOS 111 02/15/2018 0857   AST 15 02/15/2018 0857   ALT 12  02/15/2018 0857   BILITOT 0.3 02/15/2018 0857       All questions were answered. The patient knows to call the clinic with any problems, questions or concerns. No barriers to learning was detected.  I spent 25 minutes counseling the patient face to face. The total time spent in the appointment was 40 minutes and more than 50% was on counseling and review of test results  Heath Lark, MD 02/15/2018 4:19 PM

## 2018-02-15 NOTE — Assessment & Plan Note (Signed)
She tolerated treatment well except for other major comorbidities including diffuse nonspecific pain, weakness and uncontrolled hypertension I plan minor dose adjustment due to multiple side effects I will keep carboplatin at 600 mg for now due to fluctuation of kidney function

## 2018-02-16 ENCOUNTER — Other Ambulatory Visit: Payer: Self-pay | Admitting: *Deleted

## 2018-02-16 ENCOUNTER — Telehealth: Payer: Self-pay | Admitting: *Deleted

## 2018-02-16 LAB — VITAMIN D 25 HYDROXY (VIT D DEFICIENCY, FRACTURES): VIT D 25 HYDROXY: 10.9 ng/mL — AB (ref 30.0–100.0)

## 2018-02-16 MED ORDER — VITAMIN D (ERGOCALCIFEROL) 1.25 MG (50000 UNIT) PO CAPS: 50000.0000 [IU] | ORAL_CAPSULE | ORAL | 0 refills | Status: DC

## 2018-02-16 MED ORDER — AMLODIPINE BESYLATE 10 MG PO TABS: 10.0000 mg | ORAL_TABLET | Freq: Every day | ORAL | 0 refills | Status: DC

## 2018-02-16 MED FILL — AMLODIPINE BESYLATE 10 MG T: 10 | 90 days supply | Qty: 90 | Fill #0

## 2018-02-16 MED FILL — VIT D2 1.25 MG (50,000 UNIT: 1.25 MG | 84 days supply | Qty: 12 | Fill #0

## 2018-02-16 NOTE — Telephone Encounter (Signed)
Notified of message below. Prescription sent to [facility] per patient request

## 2018-02-16 NOTE — Telephone Encounter (Signed)
-----   Message from Heath Lark, MD sent at 02/16/2018  7:28 AM EDT ----- Regarding: low vitamin D As predicted her vitamin D level is low Please call in vitamin D 50,000 units weekly x 12 weeks, no refills

## 2018-02-20 ENCOUNTER — Telehealth: Payer: Self-pay | Admitting: *Deleted

## 2018-02-20 NOTE — Telephone Encounter (Signed)
CALLED PATIENT TO REMIND OF HDR TX. FOR 02-21-18 @ 10:30 AM, SPOKE WITH PATIENT AND SHE IS AWARE OF THIS [place].

## 2018-02-20 NOTE — Telephone Encounter (Signed)
xxxx 

## 2018-02-21 ENCOUNTER — Encounter: Payer: Self-pay | Admitting: Radiation Oncology

## 2018-02-21 ENCOUNTER — Ambulatory Visit
Admission: RE | Admit: 2018-02-21 | Discharge: 2018-02-21 | Disposition: A | Discharge status: Home / Self Care | Payer: Self-pay | Source: Ambulatory Visit | Attending: Radiation Oncology | Admitting: Radiation Oncology

## 2018-02-21 DIAGNOSIS — C541 Malignant neoplasm of endometrium: Secondary | ICD-10-CM | POA: Insufficient documentation

## 2018-02-21 DIAGNOSIS — Z510 Encounter for antineoplastic radiation therapy: Secondary | ICD-10-CM | POA: Insufficient documentation

## 2018-02-21 NOTE — Progress Notes (Signed)
  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 She will be treated with a 3 cm diameter segmented cylinder. This conforms to her anatomy without undue discomfort.  Vaginal brachytherapy procedure node: The patient was brought to the [facility] suite. Identity was confirmed. All relevant records and images related to the planned course of therapy were reviewed. The patient freely provided informed written consent to proceed with treatment after reviewing the details related to the planned course of therapy. The consent form was witnessed and verified by the simulation staff. Then, the patient was set-up in a stable reproducible supine position for radiation therapy. Pelvic exam revealed the vaginal cuff to be intact. The patient's custom vaginal cylinder was placed in the proximal vagina. This was affixed to the CT/MR stabilization plate to prevent slippage. Patient tolerated the placement well.  Verification simulation note:  A fiducial marker was placed within the vaginal cylinder. An AP and lateral film was then obtained through the pelvis area. This documented accurate position of the vaginal cylinder for treatment.  HDR BRACHYTHERAPY TREATMENT  The remote afterloading device was affixed to the vaginal cylinder by catheter. Patient then proceeded to undergo her fifth high-dose-rate treatment directed at the proximal vagina. The patient was prescribed a dose of 6.0 gray to be delivered to the mucosal surface. Treatment length was 3.5 cm. Patient was treated with 1 channel using 8 dwell positions. Treatment time was 323.80 seconds. Iridium 192 was the  high-dose-rate source for treatment. The patient tolerated the treatment well. After completion of her therapy, a radiation survey was performed documenting return of the iridium source into the [employer] safe.   PLAN: Patient has completed HDR treatment. She will follow up with radiation oncology in one month. ________________________________  Blair Promise, PhD, MD   This document serves as a record of services personally performed by Gery Pray, MD. It was created on his behalf by Wilburn Mylar, a trained medical scribe. The creation of this record is based on the scribe's personal observations and the provider's statements to them. This document has been checked and approved by the attending provider.

## 2018-02-21 NOTE — Progress Notes (Signed)
  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 Prescription was to the vaginal mucosal surface.  Narrative: The patient tolerated radiation treatment relatively well.  No significant urinary symptoms during treatment.  Plan: The patient has completed radiation treatment. The patient will return to radiation oncology clinic for routine followup in one month. I advised them to call or return sooner if they have any questions or concerns related to their recovery or treatment.  -----------------------------------  Blair Promise, PhD, MD  This document serves as a record of services personally performed by Gery Pray, MD. It was created on his behalf by Wilburn Mylar, a trained medical scribe. The creation of this record is based on the scribe's personal observations and the provider's statements to them. This document has been checked and approved by the attending provider.

## 2018-03-13 ENCOUNTER — Ambulatory Visit: Payer: Self-pay | Admitting: Cardiology

## 2018-03-14 ENCOUNTER — Other Ambulatory Visit: Payer: Self-pay | Admitting: Hematology and Oncology

## 2018-03-14 ENCOUNTER — Telehealth: Payer: Self-pay

## 2018-03-14 MED FILL — METOPROLOL TARTRATE 25 MG T: 25 | 30 days supply | Qty: 60 | Fill #0

## 2018-03-14 NOTE — Telephone Encounter (Signed)
Scheduling received a message from her that she needs to change tomorrows appt due to a family emergency. Scheduling will call her and reschedule her appt.

## 2018-03-15 ENCOUNTER — Inpatient Hospital Stay: Payer: PRIVATE HEALTH INSURANCE

## 2018-03-15 ENCOUNTER — Inpatient Hospital Stay: Payer: PRIVATE HEALTH INSURANCE | Admitting: Hematology and Oncology

## 2018-03-20 ENCOUNTER — Telehealth: Payer: Self-pay | Admitting: *Deleted

## 2018-03-20 NOTE — Telephone Encounter (Signed)
":  Angela Gardner [phone]) to let Dr.Gorsuch know I have no feelings in my fingers.  Is this because I missed the last chemotherapy?  Fingertips are numb to both hands the last four days.  Using heating pad.  Able to use them.  Feet feel fine.  No blisters, swelling or change in color.  The only other symptom I feel is dizziness when I take B/P medication."  Personal B/P cuff used. 3:48 pm = 85/48 3:51 pm = 180/ 97.  Notifying provider.   Hand Foot precautions to avoid heat and excessive friction. Do not use heating pad.  Bathe/shower warm water.  Do not rub but pat dry, moisturize, wear gloves to avoid chemicals with cleaning, dish washing.  Wear shoes that fit well allowing free movement inside.    Reviewed avoiding salt and drinking water with report  of eating oodle noodles and chicken.

## 2018-03-21 NOTE — Telephone Encounter (Signed)
She called back and said she got the above message. She verbalized understanding. Blood pressure is better today and she see [place] on 10/17 appt. She thanks for the call back.

## 2018-03-21 NOTE — Telephone Encounter (Signed)
Message left on voicemail with provider instructions.  Requested return call to confirm receipt and any questions.  Awaiting return call from patient.

## 2018-03-21 NOTE — Telephone Encounter (Signed)
The numbness is from chemo. Nothing to do for that now, typically numbness will improve in time She needs to see her primary doctor for BP medication adjustment

## 2018-03-26 ENCOUNTER — Ambulatory Visit: Payer: Self-pay | Admitting: Family Medicine

## 2018-03-26 ENCOUNTER — Other Ambulatory Visit: Payer: Self-pay | Admitting: Hematology and Oncology

## 2018-03-26 ENCOUNTER — Ambulatory Visit: Payer: Self-pay | Admitting: Radiation Oncology

## 2018-03-26 DIAGNOSIS — C541 Malignant neoplasm of endometrium: Secondary | ICD-10-CM | POA: Insufficient documentation

## 2018-03-26 DIAGNOSIS — Z510 Encounter for antineoplastic radiation therapy: Secondary | ICD-10-CM | POA: Insufficient documentation

## 2018-03-26 MED FILL — hydrALAZINE HCL 10 MG TABS: 10 | 30 days supply | Qty: 90 | Fill #0

## 2018-03-29 ENCOUNTER — Encounter: Payer: Self-pay | Admitting: Cardiology

## 2018-03-29 ENCOUNTER — Ambulatory Visit (INDEPENDENT_AMBULATORY_CARE_PROVIDER_SITE_OTHER): Payer: Self-pay | Admitting: Cardiology

## 2018-03-29 VITALS — BP 150/88 | HR 84 | Ht 68.0 in | Wt 260.0 lb

## 2018-03-29 DIAGNOSIS — I5032 Chronic diastolic (congestive) heart failure: Secondary | ICD-10-CM

## 2018-03-29 DIAGNOSIS — I10 Essential (primary) hypertension: Secondary | ICD-10-CM

## 2018-03-29 DIAGNOSIS — C541 Malignant neoplasm of endometrium: Secondary | ICD-10-CM

## 2018-03-29 NOTE — Progress Notes (Signed)
Cardiology Office Note:    Date:  03/29/2018   ID:  Angela Gardner, DOB 1960-11-18, MRN [id number]  PCP:  Charlott Rakes, MD  Cardiologist:  Jenne Campus, MD    Referring MD: Charlott Rakes, MD   Chief Complaint  Patient presents with  . 2 month follow up  I am weak and tired  History of Present Illness:    Angela Gardner is a 57 y.o. female with diastolic congestive heart failure.  She also got uterine cancer received radiation therapy as well as chemotherapy for it.  Obviously she is weak tired exhausted she also lost her denies have any chest pain tightness squeezing pressure burning chest.  The biggest problem she has is her hip she has a lot of pain there.  She does have mild swelling of lower extremities but otherwise seems to be doing fine no proximal nocturnal dyspnea.  Past Medical History:  Diagnosis Date  . Anemia   . 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 CHF (congestive heart failure) ([place])   . Dyspnea    still having this and not moving around alot -gets worse with exertion  . History of sleep walking   . Hx of left breast biopsy   . Hyperlipemia   . Hypertension    not on any medication now-[facility] had  prescribed her a med and she stopped taking them cannot remember when    Past Surgical History:  Procedure Laterality Date  . BREAST BIOPSY     left breast  . CESAREAN SECTION     one  . CHOLECYSTECTOMY    . DILATION AND CURETTAGE OF UTERUS     approx 1 year ago  . IR IMAGING GUIDED PORT INSERTION  12/06/2017  . LYMPH NODE BIOPSY N/A 11/14/2017   Procedure: SENTINEL LYMPH NODE BIOPSY;  Surgeon: Everitt Amber, MD;  Location: [facility];  Service: Gynecology;  Laterality: N/A;  . ROBOTIC ASSISTED TOTAL HYSTERECTOMY WITH BILATERAL SALPINGO OOPHERECTOMY N/A 11/14/2017   Procedure: XI ROBOTIC ASSISTED TOTAL HYSTERECTOMY WITH BILATERAL SALPINGO OOPHORECTOMY, RIGHT PELVIC LYMPHADENECTOMY;  Surgeon: Everitt Amber, MD;  Location: [facility];  Service: Gynecology;   Laterality: N/A;    Current Medications: Current Meds  Medication Sig  . acetaminophen (TYLENOL) 500 MG tablet Take 1,000 mg by mouth every 6 (six) hours as needed.  Marland Kitchen amLODipine (NORVASC) 10 MG tablet Take 1 tablet (10 mg total) by mouth daily.  Marland Kitchen dexamethasone (DECADRON) 4 MG tablet Take 5 tabs at the night before and 5 tab the morning of chemotherapy, every 3 weeks, by mouth  . furosemide (LASIX) 40 MG tablet Take 1 tablet (40 mg total) by mouth daily.  . hydrALAZINE (APRESOLINE) 10 MG tablet TAKE 1 TABLET BY MOUTH 3 TIMES DAILY  . ibuprofen (ADVIL,MOTRIN) 200 MG tablet Take 400 mg by mouth every 6 (six) hours as needed.  . lidocaine-prilocaine (EMLA) cream Apply to affected area once  . magic mouthwash w/lidocaine SOLN Take 5 mLs by mouth 4 (four) times daily as needed for mouth pain. Swish and spit  . metoprolol tartrate (LOPRESSOR) 25 MG tablet TAKE 1 TABLET BY MOUTH TWICE DAILY  . ondansetron (ZOFRAN) 8 MG tablet Take 1 tablet (8 mg total) by mouth every 8 (eight) hours as needed for refractory nausea / vomiting. Start on day 3 after chemo.  . polyethylene glycol (MIRALAX / GLYCOLAX) packet Take 17 g by mouth daily as needed for mild constipation.   . prochlorperazine (COMPAZINE) 10 MG tablet Take 1  tablet (10 mg total) by mouth every 6 (six) hours as needed (Nausea or vomiting).  Marland Kitchen senna (SENOKOT) 8.6 MG TABS tablet Take 1 tablet by mouth daily.  . Vitamin D, Ergocalciferol, (DRISDOL) 50000 units CAPS capsule Take 1 capsule (50,000 Units total) by mouth every 7 (seven) days.     Allergies:   Vicodin [hydrocodone-acetaminophen]   Social History   Socioeconomic History  . Marital status: Single    Spouse name: Not on file  . Number of children: 1  . Years of education: Not on file  . Highest education level: Not on file  Occupational History  . Not on file  Social Needs  . Financial resource strain: Not on file  . Food insecurity:    Worry: Not on file    Inability: Not on  file  . Transportation needs:    Medical: Not on file    Non-medical: Not on file  Tobacco Use  . Smoking status: Former Smoker    Packs/day: 0.30    Years: 26.00    Pack years: 7.80    Types: Cigarettes  . Smokeless tobacco: Never Used  . Tobacco comment: quit May 2019  Substance and Sexual Activity  . Alcohol use: Yes    Comment: social drinking  . Drug use: Yes    Types: Cocaine    Comment: last time was one  to two weeks ago per patient   . Sexual activity: Not Currently    Birth control/protection: Post-menopausal  Lifestyle  . Physical activity:    Days per week: Not on file    Minutes per session: Not on file  . Stress: Not on file  Relationships  . Social connections:    Talks on phone: Not on file    Gets together: Not on file    Attends religious service: Not on file    Active member of club or organization: Not on file    Attends meetings of clubs or organizations: Not on file    Relationship status: Not on file  Other Topics Concern  . Not on file  Social History Narrative  . Not on file     Family History: The patient's family history includes Anuerysm in her mother; Congestive Heart Failure in her mother; Hypertension in her brother, father, and sister. ROS:   Please see the history of present illness.    All 14 point review of systems negative except as described per history of present illness  EKGs/Labs/Other Studies Reviewed:      Recent Labs: 09/12/2017: B Natriuretic Peptide 258.2 02/15/2018: ALT 12; BUN 27; Creatinine 1.30; Hemoglobin 11.6; Platelet Count 222; Potassium 3.8; Sodium 142  Recent Lipid Panel    Component Value Date/Time   CHOL 228 (H) 03/30/2009 2039   TRIG 139 03/30/2009 2039   HDL 67 03/30/2009 2039   CHOLHDL 3.4 Ratio 03/30/2009 2039   VLDL 28 03/30/2009 2039   LDLCALC 133 (H) 03/30/2009 2039    Physical Exam:    VS:  BP (!) 150/88   Pulse 84   Ht 5\' 8"  (1.727 m)   Wt 260 lb (117.9 kg)   LMP  (LMP Unknown)  Comment: vaginal bleeding continuously for last 8 months  SpO2 97%   BMI 39.53 kg/m     Wt Readings from Last 3 Encounters:  03/29/18 260 lb (117.9 kg)  02/15/18 244 lb 9.6 oz (110.9 kg)  01/25/18 248 lb 12.8 oz (112.9 kg)     GEN:  Well nourished, well  developed in no acute distress HEENT: Normal NECK: No JVD; No carotid bruits LYMPHATICS: No lymphadenopathy CARDIAC: RRR, no murmurs, no rubs, no gallops RESPIRATORY:  Clear to auscultation without rales, wheezing or rhonchi  ABDOMEN: Soft, non-tender, non-distended MUSCULOSKELETAL:  No edema; No deformity  SKIN: Warm and dry LOWER EXTREMITIES: no swelling NEUROLOGIC:  Alert and oriented x 3 PSYCHIATRIC:  Normal affect   ASSESSMENT:    1. Chronic diastolic congestive heart failure, NYHA class 3 ([place])   2. Essential hypertension   3. Endometrial cancer ([place])    PLAN:    In order of problems listed above:  1. Chronic diastolic congestive heart will check Chem-7 as well as proBNP and based on that side about therapy. 2. Essential hypertension blood pressure slightly elevated I anticipate increase diuresis which should helps with her blood pressures well 3. Endometrial cancer.  Radiation therapy finished she still on chemotherapy.   Medication Adjustments/Labs and Tests Ordered: Current medicines are reviewed at length with the patient today.  Concerns regarding medicines are outlined above.  No orders of the defined types were placed in this encounter.  Medication changes: No orders of the defined types were placed in this encounter.   Signed, Park Liter, MD, [facility] 03/29/2018 4:03 PM    [facility]

## 2018-03-29 NOTE — Patient Instructions (Signed)
Medication Instructions:  Your physician recommends that you continue on your current medications as directed. Please refer to the Current Medication list given to you today.  If you need a refill on your cardiac medications before your next appointment, please call your pharmacy.   Lab work: Your physician recommends that you return for lab work today: BMP and BNP  If you have labs (blood work) drawn today and your tests are completely normal, you will receive your results only by: Marland Kitchen MyChart Message (if you have MyChart) OR . A paper copy in the mail If you have any lab test that is abnormal or we need to change your treatment, we will call you to review the results.  Testing/Procedures: None.  Follow-Up: At [facility], you and your health needs are our priority.  As part of our continuing mission to provide you with exceptional heart care, we have created designated Provider Care Teams.  These Care Teams include your primary Cardiologist (physician) and Advanced Practice Providers (APPs -  Physician Assistants and Nurse Practitioners) who all work together to provide you with the care you need, when you need it. You will need a follow up appointment in 4 months.  Please call our office 2 months in advance to schedule this appointment.  You may see Jenne Campus, MD or another member of our [place] Provider Team in [place]: Shirlee More, MD . Jyl Heinz, MD  Any Other Special Instructions Will Be Listed Below (If Applicable).

## 2018-03-30 LAB — BASIC METABOLIC PANEL
BUN / CREAT RATIO: 21 (ref 9–23)
BUN: 32 mg/dL — AB (ref 6–24)
CHLORIDE: 101 mmol/L (ref 96–106)
CO2: 23 mmol/L (ref 20–29)
Calcium: 10.3 mg/dL — ABNORMAL HIGH (ref 8.7–10.2)
Creatinine, Ser: 1.53 mg/dL — ABNORMAL HIGH (ref 0.57–1.00)
GFR calc Af Amer: 44 mL/min/{1.73_m2} — ABNORMAL LOW (ref >59)
GFR calc non Af Amer: 38 mL/min/{1.73_m2} — ABNORMAL LOW (ref >59)
Glucose: 98 mg/dL (ref 65–99)
POTASSIUM: 4.7 mmol/L (ref 3.5–5.2)
Sodium: 141 mmol/L (ref 134–144)

## 2018-03-30 LAB — PRO B NATRIURETIC PEPTIDE: NT-Pro BNP: 468 pg/mL — ABNORMAL HIGH (ref 0–287)

## 2018-03-30 LAB — SPECIMEN STATUS REPORT

## 2018-04-02 ENCOUNTER — Ambulatory Visit: Payer: Self-pay | Attending: Family Medicine | Admitting: Family Medicine

## 2018-04-02 ENCOUNTER — Encounter: Payer: Self-pay | Admitting: Family Medicine

## 2018-04-02 ENCOUNTER — Telehealth: Payer: Self-pay | Admitting: Pharmacist

## 2018-04-02 VITALS — BP 153/93 | HR 76 | Temp 97.5°F | Ht 68.0 in | Wt 258.8 lb

## 2018-04-02 DIAGNOSIS — Z923 Personal history of irradiation: Secondary | ICD-10-CM | POA: Insufficient documentation

## 2018-04-02 DIAGNOSIS — F32A Depression, unspecified: Secondary | ICD-10-CM | POA: Insufficient documentation

## 2018-04-02 DIAGNOSIS — Z90710 Acquired absence of both cervix and uterus: Secondary | ICD-10-CM | POA: Insufficient documentation

## 2018-04-02 DIAGNOSIS — R7303 Prediabetes: Secondary | ICD-10-CM

## 2018-04-02 DIAGNOSIS — N183 Chronic kidney disease, stage 3 (moderate): Secondary | ICD-10-CM | POA: Insufficient documentation

## 2018-04-02 DIAGNOSIS — E785 Hyperlipidemia, unspecified: Secondary | ICD-10-CM | POA: Insufficient documentation

## 2018-04-02 DIAGNOSIS — F320 Major depressive disorder, single episode, mild: Secondary | ICD-10-CM

## 2018-04-02 DIAGNOSIS — Z1239 Encounter for other screening for malignant neoplasm of breast: Secondary | ICD-10-CM

## 2018-04-02 DIAGNOSIS — I130 Hypertensive heart and chronic kidney disease with heart failure and stage 1 through stage 4 chronic kidney disease, or unspecified chronic kidney disease: Secondary | ICD-10-CM | POA: Insufficient documentation

## 2018-04-02 DIAGNOSIS — I10 Essential (primary) hypertension: Secondary | ICD-10-CM

## 2018-04-02 DIAGNOSIS — I5032 Chronic diastolic (congestive) heart failure: Secondary | ICD-10-CM

## 2018-04-02 DIAGNOSIS — G8929 Other chronic pain: Secondary | ICD-10-CM

## 2018-04-02 DIAGNOSIS — F329 Major depressive disorder, single episode, unspecified: Secondary | ICD-10-CM | POA: Insufficient documentation

## 2018-04-02 DIAGNOSIS — C541 Malignant neoplasm of endometrium: Secondary | ICD-10-CM

## 2018-04-02 DIAGNOSIS — Z79899 Other long term (current) drug therapy: Secondary | ICD-10-CM | POA: Insufficient documentation

## 2018-04-02 DIAGNOSIS — M25562 Pain in left knee: Secondary | ICD-10-CM | POA: Insufficient documentation

## 2018-04-02 HISTORY — DX: Depression, unspecified: F32.A

## 2018-04-02 HISTORY — DX: Prediabetes: R73.03

## 2018-04-02 LAB — POCT GLYCOSYLATED HEMOGLOBIN (HGB A1C): HbA1c, POC (prediabetic range): 6.1 % (ref 5.7–6.4)

## 2018-04-02 MED ORDER — HYDRALAZINE HCL 25 MG PO TABS: 25.0000 mg | ORAL_TABLET | Freq: Three times a day (TID) | ORAL | 6 refills | Status: DC

## 2018-04-02 MED ORDER — METOPROLOL TARTRATE 25 MG PO TABS: 25.0000 mg | ORAL_TABLET | Freq: Two times a day (BID) | ORAL | 6 refills | Status: DC

## 2018-04-02 MED ORDER — AMLODIPINE BESYLATE 10 MG PO TABS: 10.0000 mg | ORAL_TABLET | Freq: Every day | ORAL | 6 refills | Status: DC

## 2018-04-02 MED ORDER — FUROSEMIDE 40 MG PO TABS: 40.0000 mg | ORAL_TABLET | Freq: Two times a day (BID) | ORAL | 3 refills | Status: DC

## 2018-04-02 NOTE — Patient Instructions (Signed)

## 2018-04-02 NOTE — Telephone Encounter (Signed)
Patient was educated on DM and lifestyle intervention. Reviewed necessary information. Also reviewed goal blood glucose levels. All questions and concerns were addressed.

## 2018-04-02 NOTE — Progress Notes (Signed)
Subjective:  Patient ID: [name], female    DOB: 04-20-1961  Age: 57 y.o. MRN: [id number]  CC: Hypertension   HPI Cassity Christian  is a 57 year old female with a history of hypertension, stage III chronic kidney disease, diastolic CHF (EF 52% from 08/2017), newly diagnosed endometrial carcinoma in 09/2017 s/p total abdominal hysterectomy and bilateral salpingo-oophorectomy on 11/14/2017, status post radiation, currently on chemotherapy who presents today for follow-up visit. She reports having a good appetite and has gained a lot of weight (of note she is currently on Decadron).  Last seen by oncology, Dr. Alvy Bimler on 02/15/2018. She endorses being sad and down due to her ongoing medical conditions but denies suicidal ideation or intents.  She is open to therapy but not to commencing any medications.  She complains of shortness of breath on mild exertion but no orthopnea or paroxysmal nocturnal dyspnea.  At her last visit with cardiology on 03/29/2018 her BNP was elevated at 468.  She continues to take her Lasix. Her blood pressure is elevated and she endorses compliance with her antihypertensives.  She complains of left knee pain and slight swelling and has had a couple of falls which has letter to ambulate with a cane and at other times the walker.  Pain is described as moderate.  Past Medical History:  Diagnosis Date  . Anemia   . Cancer ([place])    left ductal papilloma  . CHF (congestive heart failure) ([place])   . Dyspnea    still having this and not moving around alot -gets worse with exertion  . History of sleep walking   . Hx of left breast biopsy   . Hyperlipemia   . Hypertension    not on any medication now-[facility] had  prescribed her a med and she stopped taking them cannot remember when    Past Surgical History:  Procedure Laterality Date  . BREAST BIOPSY     left breast  . CESAREAN SECTION     one  . CHOLECYSTECTOMY    . DILATION AND CURETTAGE OF UTERUS     approx 1 year ago  . IR IMAGING GUIDED PORT INSERTION  12/06/2017  . LYMPH NODE BIOPSY N/A 11/14/2017   Procedure: SENTINEL LYMPH NODE BIOPSY;  Surgeon: Everitt Amber, MD;  Location: [facility];  Service: Gynecology;  Laterality: N/A;  . ROBOTIC ASSISTED TOTAL HYSTERECTOMY WITH BILATERAL SALPINGO OOPHERECTOMY N/A 11/14/2017   Procedure: XI ROBOTIC ASSISTED TOTAL HYSTERECTOMY WITH BILATERAL SALPINGO OOPHORECTOMY, RIGHT PELVIC LYMPHADENECTOMY;  Surgeon: Everitt Amber, MD;  Location: [facility];  Service: Gynecology;  Laterality: N/A;    Allergies  Allergen Reactions  . Vicodin [Hydrocodone-Acetaminophen] Itching     Outpatient Medications Prior to Visit  Medication Sig Dispense Refill  . acetaminophen (TYLENOL) 500 MG tablet Take 1,000 mg by mouth every 6 (six) hours as needed.    Marland Kitchen dexamethasone (DECADRON) 4 MG tablet Take 5 tabs at the night before and 5 tab the morning of chemotherapy, every 3 weeks, by mouth 60 tablet 0  . ibuprofen (ADVIL,MOTRIN) 200 MG tablet Take 400 mg by mouth every 6 (six) hours as needed.    . lidocaine-prilocaine (EMLA) cream Apply to affected area once 30 g 3  . magic mouthwash w/lidocaine SOLN Take 5 mLs by mouth 4 (four) times daily as needed for mouth pain. Swish and spit 140 mL 0  . ondansetron (ZOFRAN) 8 MG tablet Take 1 tablet (8 mg total) by mouth every 8 (eight) hours as needed for refractory nausea /  vomiting. Start on day 3 after chemo. 30 tablet 1  . polyethylene glycol (MIRALAX / GLYCOLAX) packet Take 17 g by mouth daily as needed for mild constipation.     . prochlorperazine (COMPAZINE) 10 MG tablet Take 1 tablet (10 mg total) by mouth every 6 (six) hours as needed (Nausea or vomiting). 30 tablet 1  . senna (SENOKOT) 8.6 MG TABS tablet Take 1 tablet by mouth daily.    . Vitamin D, Ergocalciferol, (DRISDOL) 50000 units CAPS capsule Take 1 capsule (50,000 Units total) by mouth every 7 (seven) days. 12 capsule 0  . amLODipine (NORVASC) 10 MG tablet Take 1 tablet (10  mg total) by mouth daily. 90 tablet 0  . furosemide (LASIX) 40 MG tablet Take 1 tablet (40 mg total) by mouth daily. 30 tablet 0  . hydrALAZINE (APRESOLINE) 10 MG tablet TAKE 1 TABLET BY MOUTH 3 TIMES DAILY 90 tablet 0  . metoprolol tartrate (LOPRESSOR) 25 MG tablet TAKE 1 TABLET BY MOUTH TWICE DAILY 60 tablet 0   No facility-administered medications prior to visit.     ROS Review of Systems  Constitutional: Positive for unexpected weight change. Negative for activity change, appetite change and fatigue.  HENT: Negative for congestion, sinus pressure and sore throat.   Eyes: Negative for visual disturbance.  Respiratory: Negative for cough, chest tightness, shortness of breath and wheezing.   Cardiovascular: Negative for chest pain and palpitations.  Gastrointestinal: Negative for abdominal distention, abdominal pain and constipation.  Endocrine: Negative for polydipsia.  Genitourinary: Negative for dysuria and frequency.  Musculoskeletal:       See hpi  Skin: Negative for rash.  Neurological: Negative for tremors, light-headedness and numbness.  Hematological: Does not bruise/bleed easily.  Psychiatric/Behavioral: Positive for dysphoric mood. Negative for agitation and behavioral problems.    Objective:  BP (!) 153/93   Pulse 76   Temp (!) 97.5 F (36.4 C) (Oral)   Ht 5\' 8"  (1.727 m)   Wt 258 lb 12.8 oz (117.4 kg)   LMP  (LMP Unknown) Comment: vaginal bleeding continuously for last 8 months  SpO2 96%   BMI 39.35 kg/m   BP/Weight 04/02/2018 03/29/2018 0/25/8527  Systolic BP 782 423 536  Diastolic BP 93 88 89  Wt. (Lbs) 258.8 260 -  BMI 39.35 39.53 -      Physical Exam  Constitutional: She is oriented to person, place, and time. She appears well-developed and well-nourished.  HENT:  Right Ear: External ear normal.  Left Ear: External ear normal.  Mouth/Throat: Oropharynx is clear and moist.  Cardiovascular: Normal rate, normal heart sounds and intact distal pulses.   No murmur heard. Pulmonary/Chest: Effort normal and breath sounds normal. She has no wheezes. She has no rales. She exhibits no tenderness.  Abdominal: Soft. Bowel sounds are normal. She exhibits no distension and no mass. There is no tenderness.  Musculoskeletal:  Normal appearance of knees bilaterally Slight tenderness of left knee on flexion and extension No tenderness along medial and lateral joint lines  Neurological: She is alert and oriented to person, place, and time.  Skin: Skin is warm and dry.  Psychiatric: She has a normal mood and affect.    CMP Latest Ref Rng & Units 03/29/2018 02/15/2018 01/25/2018  Glucose 65 - 99 mg/dL 98 146(H) 177(H)  BUN 6 - 24 mg/dL 32(H) 27(H) 27(H)  Creatinine 0.57 - 1.00 mg/dL 1.53(H) 1.30(H) 1.38(H)  Sodium 134 - 144 mmol/L 141 142 141  Potassium 3.5 - 5.2 mmol/L 4.7 3.8  4.0  Chloride 96 - 106 mmol/L 101 105 105  CO2 20 - 29 mmol/L 23 24 24   Calcium 8.7 - 10.2 mg/dL 10.3(H) 10.1 10.7(H)  Total Protein 6.5 - 8.1 g/dL - 8.1 8.2(H)  Total Bilirubin 0.3 - 1.2 mg/dL - 0.3 0.3  Alkaline Phos 38 - 126 U/L - 111 126  AST 15 - 41 U/L - 15 16  ALT 0 - 44 U/L - 12 16    Lab Results  Component Value Date   HGBA1C 6.1 04/02/2018    Assessment & Plan:   1. Endometrial cancer [facility]) S/p post TAH/BSO Completed radiation Currently on chemo Followed by oncology  2. Prediabetes A1c of 6.1 Clinical pharmacist called in to discuss dietary management and lifestyle modifications She is at high risk due to ongoing steroid use Once your next visit.  3. Screening for breast cancer - [facility] Digital Screening; Future  4. Chronic diastolic congestive heart failure, NYHA class 3 (HCC) EF 50 to 55% Slightly fluid overloaded with elevated BNP of 468 Steroid use also contributory We will increase Lasix to twice daily dosing for 1 week and referred back to once daily given abnormal kidney function Low-sodium, daily weight - furosemide (LASIX) 40 MG tablet;  Take 1 tablet (40 mg total) by mouth 2 (two) times daily. For 1 week then resume 1 tablet daily  Dispense: 45 tablet; Refill: 3  5. Essential hypertension Uncontrolled Increased hydralazine dose - hydrALAZINE (APRESOLINE) 25 MG tablet; Take 1 tablet (25 mg total) by mouth 3 (three) times daily.  Dispense: 90 tablet; Refill: 6 - amLODipine (NORVASC) 10 MG tablet; Take 1 tablet (10 mg total) by mouth daily.  Dispense: 30 tablet; Refill: 6 - metoprolol tartrate (LOPRESSOR) 25 MG tablet; Take 1 tablet (25 mg total) by mouth 2 (two) times daily.  Dispense: 60 tablet; Refill: 6  6. Current mild episode of major depressive disorder without prior episode ([place]) Due to ongoing medical conditions Declines initiation of medication   7. Chronic pain of left knee Exacerbated by weight gain History of falls-we will benefit from PT - Ambulatory referral to Physical Therapy  8. Morbid obesity ([place]) Discussed reducing portion sizes, exercising as tolerated, healthy snacking   Meds ordered this encounter  Medications  . hydrALAZINE (APRESOLINE) 25 MG tablet    Sig: Take 1 tablet (25 mg total) by mouth 3 (three) times daily.    Dispense:  90 tablet    Refill:  6  . amLODipine (NORVASC) 10 MG tablet    Sig: Take 1 tablet (10 mg total) by mouth daily.    Dispense:  30 tablet    Refill:  6  . metoprolol tartrate (LOPRESSOR) 25 MG tablet    Sig: Take 1 tablet (25 mg total) by mouth 2 (two) times daily.    Dispense:  60 tablet    Refill:  6  . furosemide (LASIX) 40 MG tablet    Sig: Take 1 tablet (40 mg total) by mouth 2 (two) times daily. For 1 week then resume 1 tablet daily    Dispense:  45 tablet    Refill:  3    Follow-up: Return in about 3 months (around 07/03/2018) for Follow-up of chronic medical conditions.   Charlott Rakes MD

## 2018-04-05 ENCOUNTER — Inpatient Hospital Stay: Payer: Medicaid Other | Attending: Hematology and Oncology

## 2018-04-05 ENCOUNTER — Inpatient Hospital Stay: Payer: Medicaid Other

## 2018-04-05 ENCOUNTER — Telehealth: Payer: Self-pay | Admitting: Hematology and Oncology

## 2018-04-05 ENCOUNTER — Inpatient Hospital Stay (HOSPITAL_BASED_OUTPATIENT_CLINIC_OR_DEPARTMENT_OTHER): Payer: Medicaid Other | Admitting: Hematology and Oncology

## 2018-04-05 ENCOUNTER — Encounter: Payer: Self-pay | Admitting: Oncology

## 2018-04-05 ENCOUNTER — Encounter: Payer: Self-pay | Admitting: Hematology and Oncology

## 2018-04-05 VITALS — BP 147/77 | HR 65 | Temp 97.7°F | Resp 18 | Ht 68.0 in | Wt 258.6 lb

## 2018-04-05 DIAGNOSIS — I10 Essential (primary) hypertension: Secondary | ICD-10-CM

## 2018-04-05 DIAGNOSIS — E559 Vitamin D deficiency, unspecified: Secondary | ICD-10-CM | POA: Insufficient documentation

## 2018-04-05 DIAGNOSIS — N179 Acute kidney failure, unspecified: Secondary | ICD-10-CM | POA: Insufficient documentation

## 2018-04-05 DIAGNOSIS — T451X5S Adverse effect of antineoplastic and immunosuppressive drugs, sequela: Secondary | ICD-10-CM | POA: Insufficient documentation

## 2018-04-05 DIAGNOSIS — N183 Chronic kidney disease, stage 3 unspecified: Secondary | ICD-10-CM

## 2018-04-05 DIAGNOSIS — G620 Drug-induced polyneuropathy: Secondary | ICD-10-CM

## 2018-04-05 DIAGNOSIS — D6481 Anemia due to antineoplastic chemotherapy: Secondary | ICD-10-CM

## 2018-04-05 DIAGNOSIS — I5032 Chronic diastolic (congestive) heart failure: Secondary | ICD-10-CM

## 2018-04-05 DIAGNOSIS — C541 Malignant neoplasm of endometrium: Secondary | ICD-10-CM

## 2018-04-05 DIAGNOSIS — I129 Hypertensive chronic kidney disease with stage 1 through stage 4 chronic kidney disease, or unspecified chronic kidney disease: Secondary | ICD-10-CM | POA: Insufficient documentation

## 2018-04-05 DIAGNOSIS — Z79899 Other long term (current) drug therapy: Secondary | ICD-10-CM

## 2018-04-05 DIAGNOSIS — C55 Malignant neoplasm of uterus, part unspecified: Secondary | ICD-10-CM

## 2018-04-05 DIAGNOSIS — T451X5A Adverse effect of antineoplastic and immunosuppressive drugs, initial encounter: Secondary | ICD-10-CM

## 2018-04-05 DIAGNOSIS — R52 Pain, unspecified: Secondary | ICD-10-CM

## 2018-04-05 DIAGNOSIS — Z5111 Encounter for antineoplastic chemotherapy: Secondary | ICD-10-CM | POA: Insufficient documentation

## 2018-04-05 DIAGNOSIS — R531 Weakness: Secondary | ICD-10-CM | POA: Insufficient documentation

## 2018-04-05 HISTORY — DX: Adverse effect of antineoplastic and immunosuppressive drugs, initial encounter: D64.81

## 2018-04-05 HISTORY — DX: Anemia due to antineoplastic chemotherapy: T45.1X5A

## 2018-04-05 LAB — CBC WITH DIFFERENTIAL (CANCER CENTER ONLY)
Abs Immature Granulocytes: 0.03 10*3/uL (ref 0.00–0.07)
BASOS ABS: 0 10*3/uL (ref 0.0–0.1)
Basophils Relative: 0 %
EOS PCT: 1 %
Eosinophils Absolute: 0 10*3/uL (ref 0.0–0.5)
HEMATOCRIT: 31.3 % — AB (ref 36.0–46.0)
HEMOGLOBIN: 10.1 g/dL — AB (ref 12.0–15.0)
Immature Granulocytes: 1 %
LYMPHS ABS: 1.3 10*3/uL (ref 0.7–4.0)
LYMPHS PCT: 31 %
MCH: 31.3 pg (ref 26.0–34.0)
MCHC: 32.3 g/dL (ref 30.0–36.0)
MCV: 96.9 fL (ref 80.0–100.0)
MONO ABS: 0.2 10*3/uL (ref 0.1–1.0)
MONOS PCT: 6 %
Neutro Abs: 2.6 10*3/uL (ref 1.7–7.7)
Neutrophils Relative %: 61 %
Platelet Count: 205 10*3/uL (ref 150–400)
RBC: 3.23 MIL/uL — ABNORMAL LOW (ref 3.87–5.11)
RDW: 17.6 % — ABNORMAL HIGH (ref 11.5–15.5)
WBC Count: 4.1 10*3/uL (ref 4.0–10.5)
nRBC: 0.5 % — ABNORMAL HIGH (ref 0.0–0.2)

## 2018-04-05 LAB — CMP (CANCER CENTER ONLY)
ALT: 18 U/L (ref 0–44)
AST: 15 U/L (ref 15–41)
Albumin: 3.8 g/dL (ref 3.5–5.0)
Alkaline Phosphatase: 98 U/L (ref 38–126)
Anion gap: 10 (ref 5–15)
BUN: 24 mg/dL — ABNORMAL HIGH (ref 6–20)
CHLORIDE: 108 mmol/L (ref 98–111)
CO2: 25 mmol/L (ref 22–32)
Calcium: 9.2 mg/dL (ref 8.9–10.3)
Creatinine: 1.07 mg/dL — ABNORMAL HIGH (ref 0.44–1.00)
GFR, Estimated: 57 mL/min — ABNORMAL LOW (ref >60)
Glucose, Bld: 101 mg/dL — ABNORMAL HIGH (ref 70–99)
POTASSIUM: 3.6 mmol/L (ref 3.5–5.1)
SODIUM: 143 mmol/L (ref 135–145)
Total Bilirubin: 0.4 mg/dL (ref 0.3–1.2)
Total Protein: 7.4 g/dL (ref 6.5–8.1)

## 2018-04-05 MED ORDER — VITAMIN D (ERGOCALCIFEROL) 1.25 MG (50000 UNIT) PO CAPS: 50000.0000 [IU] | ORAL_CAPSULE | ORAL | 0 refills | Status: DC

## 2018-04-05 MED ORDER — DIPHENHYDRAMINE HCL 50 MG/ML IJ SOLN
INTRAMUSCULAR | Status: AC
  Filled 2018-04-05: qty 1

## 2018-04-05 MED ORDER — SODIUM CHLORIDE 0.9 % IV SOLN
600.0000 mg | Freq: Once | INTRAVENOUS | Status: AC
  Administered 2018-04-05: 600 mg via INTRAVENOUS
  Filled 2018-04-05: qty 60

## 2018-04-05 MED ORDER — FUROSEMIDE 40 MG PO TABS: 40.0000 mg | ORAL_TABLET | Freq: Two times a day (BID) | ORAL | 3 refills | Status: DC

## 2018-04-05 MED ORDER — SODIUM CHLORIDE 0.9 % IV SOLN
116.6667 mg/m2 | Freq: Once | INTRAVENOUS | Status: AC
  Administered 2018-04-05: 264 mg via INTRAVENOUS
  Filled 2018-04-05: qty 44

## 2018-04-05 MED ORDER — FAMOTIDINE IN NACL 20-0.9 MG/50ML-% IV SOLN
INTRAVENOUS | Status: AC
  Filled 2018-04-05: qty 50

## 2018-04-05 MED ORDER — SODIUM CHLORIDE 0.9% FLUSH
10.0000 mL | INTRAVENOUS | Status: DC | PRN
  Administered 2018-04-05: 10 mL
  Filled 2018-04-05: qty 10

## 2018-04-05 MED ORDER — HYDRALAZINE HCL 25 MG PO TABS: 25.0000 mg | ORAL_TABLET | Freq: Three times a day (TID) | ORAL | 6 refills | Status: DC

## 2018-04-05 MED ORDER — PALONOSETRON HCL INJECTION 0.25 MG/5ML
INTRAVENOUS | Status: AC
  Filled 2018-04-05: qty 5

## 2018-04-05 MED ORDER — PALONOSETRON HCL INJECTION 0.25 MG/5ML
0.2500 mg | Freq: Once | INTRAVENOUS | Status: AC
  Administered 2018-04-05: 0.25 mg via INTRAVENOUS

## 2018-04-05 MED ORDER — METOPROLOL TARTRATE 25 MG PO TABS: 25.0000 mg | ORAL_TABLET | Freq: Two times a day (BID) | ORAL | 6 refills | Status: DC

## 2018-04-05 MED ORDER — SODIUM CHLORIDE 0.9 % IV SOLN
Freq: Once | INTRAVENOUS | Status: AC
  Administered 2018-04-05: 12:00:00 via INTRAVENOUS
  Filled 2018-04-05: qty 250

## 2018-04-05 MED ORDER — FAMOTIDINE IN NACL 20-0.9 MG/50ML-% IV SOLN
20.0000 mg | Freq: Once | INTRAVENOUS | Status: AC
  Administered 2018-04-05: 20 mg via INTRAVENOUS

## 2018-04-05 MED ORDER — HEPARIN SOD (PORK) LOCK FLUSH 100 UNIT/ML IV SOLN
500.0000 [IU] | Freq: Once | INTRAVENOUS | Status: AC | PRN
  Administered 2018-04-05: 500 [IU]
  Filled 2018-04-05: qty 5

## 2018-04-05 MED ORDER — SODIUM CHLORIDE 0.9 % IV SOLN
Freq: Once | INTRAVENOUS | Status: AC
  Administered 2018-04-05: 13:00:00 via INTRAVENOUS
  Filled 2018-04-05: qty 5

## 2018-04-05 MED ORDER — DIPHENHYDRAMINE HCL 50 MG/ML IJ SOLN
50.0000 mg | Freq: Once | INTRAMUSCULAR | Status: AC
  Administered 2018-04-05: 50 mg via INTRAVENOUS

## 2018-04-05 MED ORDER — SODIUM CHLORIDE 0.9% FLUSH
10.0000 mL | Freq: Once | INTRAVENOUS | Status: AC
  Administered 2018-04-05: 10 mL
  Filled 2018-04-05: qty 10

## 2018-04-05 MED FILL — FUROSEMIDE 40 MG TAB: 40 | 38 days supply | Qty: 45 | Fill #0

## 2018-04-05 MED FILL — hydrALAZINE HCL 25 MG TABS: 25 | 30 days supply | Qty: 90 | Fill #0

## 2018-04-05 MED FILL — METOPROLOL TARTRATE 25 MG T: 25 | 30 days supply | Qty: 60 | Fill #0

## 2018-04-05 NOTE — Assessment & Plan Note (Signed)
I will proceed on reduced dose of Taxol

## 2018-04-05 NOTE — Assessment & Plan Note (Signed)
Her blood pressure is better controlled Per patient request, I have refilled all her prescriptions at the local pharmacy here due to presence of a grant to help her pay for her medications.

## 2018-04-05 NOTE — Progress Notes (Signed)
[place] OFFICE PROGRESS NOTE  Patient Care Team: Charlott Rakes, MD as PCP - General (Family Medicine) Park Liter, MD as PCP - Cardiology (Cardiology)  ASSESSMENT & PLAN:  Endometrial cancer [facility]) She tolerated treatment well except for other major comorbidities including diffuse nonspecific pain, weakness and uncontrolled hypertension I plan to continue on minor dose adjustment due to multiple side effects I will keep carboplatin at 600 mg for now due to fluctuation of kidney function I plan to repeat CT scan of the chest, abdomen and pelvis after 6 cycles of treatment to assess response to therapy. If CT scan show no evidence of disease, we will get her port removed  Essential hypertension Her blood pressure is better controlled Per patient request, I have refilled all her prescriptions at the local pharmacy here due to presence of a grant to help her pay for her medications.  Vitamin D deficiency She has profound vitamin D deficiency that would contribute to her joint pain She is on high-dose vitamin D replacement therapy Plan to recheck vitamin D level again in December  Peripheral neuropathy due to chemotherapy [facility]) I will proceed on reduced dose of Taxol  Chronic kidney disease (CKD), stage III (moderate) ([place]) She has intermittent acute renal failure I will keep her carboplatin dose stable We will continue risk factor modification including tight control of blood pressure  Diffuse pain She has diffuse nonspecific pain which I do not think is related to chemotherapy Previously, she was found to have illicit drug use I do not plan to prescribe narcotic prescription due to high risk of abuse and dependency I recommend physical therapy and rehab, along with vitamin D replacement therapy  Antineoplastic chemotherapy induced anemia This is likely due to recent treatment. The patient denies recent history of bleeding such as epistaxis, hematuria or  hematochezia. She is asymptomatic from the anemia. I will observe for now.    Orders Placed This Encounter  Procedures  . CT CHEST W CONTRAST    Standing Status:   Future    Standing Expiration Date:   04/06/2019    Order Specific Question:   If indicated for the ordered procedure, I authorize the administration of contrast media per Radiology protocol    Answer:   Yes    Order Specific Question:   Preferred imaging location?    Answer:   [facility]    Order Specific Question:   Radiology Contrast Protocol - do NOT remove file path    Answer:   \\[email or web address]    Order Specific Question:   Is patient pregnant?    Answer:   No  . CT ABDOMEN PELVIS W CONTRAST    Standing Status:   Future    Standing Expiration Date:   04/06/2019    Order Specific Question:   If indicated for the ordered procedure, I authorize the administration of contrast media per Radiology protocol    Answer:   Yes    Order Specific Question:   Preferred imaging location?    Answer:   [facility]    Order Specific Question:   Radiology Contrast Protocol - do NOT remove file path    Answer:   \\[email or web address]    Order Specific Question:   Is patient pregnant?    Answer:   No  . VITAMIN D 25 Hydroxy (Vit-D Deficiency, Fractures)    Standing Status:   Future    Standing Expiration Date:   10/05/2019    INTERVAL  HISTORY: Please see below for problem oriented charting. 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 Severe endometrial thickening at 35.2 mm. Large echogenic focus noted within the endometrial canal measuring 4.7 x 1.7 x 3.0 cm.  Endometrial fluid noted. These findings are suspicious for endometrial carcinoma .  2. Associated uterine enlargement. Two probable small fibroids are noted along the posterior fundus. The largest measures 3.7 cm.  3.  Ovaries not visualized.    05/21/2015 Initial Diagnosis    She presented with postmenopausal bleeding    06/15/2015 Pathology Results    Endometrium, biopsy - FRAGMENTED INACTIVE ENDOMETRIUM. - NO ATYPIA OR MALIGNANCY    09/13/2017 Imaging    Normal LV size with severe LV hypertrophy. EF 55%. Moderate diastolic dysfunction. Normal RV size and systolic function. No significant valvular abnormalities.    09/13/2017 Imaging    1. No acute intrathoracic, abdominal, or pelvic pathology. No aortic aneurysm or dissection. No CT evidence of pulmonary embolus. 2. Cardiomegaly with left ventricular hypertrophy    09/15/2017 Imaging    US pelvis Concern for large echogenic mass within the endometrium measuring 3.8 x 3.8 x 3.6 cm. Recommend further evaluation with sonohysterogram or hysteroscopy/biopsy.  3.8 cm fundal subserosal fibroid.     10/09/2017 Pathology Results    Endometrium, biopsy - HIGH GRADE ADENOCARCINOMA. - SEE MICROSCOPIC DESCRIPTION. Microscopic Comment There is abundant secretory type endometrium and scattered fragments of high grade adenocarcinoma, which focally has features suggestive of high grade serous carcinoma. There is also a microscopic focus with high grade carcinoma and associated spindled cell component. In this small focus a sarcomatous component cannot be ruled out; however, carcinoma with sarcomatoid features is favored.    11/14/2017 Pathology Results    1. Lymph node, sentinel, biopsy, left external iliac - ONE BENIGN LYMPH NODE  (0/1). 2. Lymph nodes, regional resection, right pelvic - FIVE BENIGN LYMPH NODES (0/5). 3. 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 - CARCINOMA INVADES 0.5 CM IN A 3 CM MYOMETRIUM. - CERVIX, BILATERAL OVARIES AND BILATERAL FALLOPIAN TUBES FREE OF TUMOR. CERVIX: - NABOTHIAN CYSTS. - NO DYSPLASIA OR MALIGNANCY. MYOMETRIUM: - ADENOMYOSIS. - LEIOMYOMATA. RIGHT AND LEFT OVARIES: - UNREMARKABLE. - NO ENDOMETRIOSIS OR MALIGNANCY. RIGHT FALLOPIAN TUBE: - PREVIOUS TUBAL LIGATION. - NO ENDOMETRIOSIS OR MALIGNANCY. LEFT FALLOPIAN TUBE: - BENIGN ADENOMATOID TUMOR, 0.8 CM. - PREVIOUS TUBAL LIGATION. - NO ENDOMETRIOSIS OR MALIGNANCY. Microscopic Comment 3. UTERUS: Procedure: Hysterectomy with bilateral salpingo-oophorectomy and left external iliac and pelvic lymph nodes. Specimen Integrity: 1.5 cm irregular defect in the endocervical canal  Tumor Size: 2.3 cm. 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 Histologic Grade (required only for adenosarcoma): High grade, grade III Myometrial Invasion (required only for adenosarcoma): 0.5 cm in a 3 cm thick myometrium. Other Tissue/ Organ Involvement: No. Margins: Free of tumor. Lymphovascular Invasion: Not identified. Regional Lymph Nodes: No lymph nodes submitted or found: 6 All  lymph nodes negative for tumor cells Number of Lymph Nodes Examined: 6 Pathologic Stage Classification (pTNM, AJCC 8th Edition): pT1a, pN0 Ancillary Studies: Immunohistochemistry for cytokeratin AE1/AE3, cytokeratin 8/18, cytokeratin 903, cytokeratin 5/6 and p53. Representative Tumor Block: 3E, 60F, 3H, 3I, and 3J. Comment(s): The tumor is partially polypoid, is 2.3 cm in greatest dimension and has features of a high grade serous carcinoma. There is also an undifferentiated component with sarcomatoid features and  immunohistochemistry shows positivity with cytokeratin 8/18 and is negative with cytokeratin AE1/AE3, cytokeratin 5/6, cytokeratin 903 and p53. The immunophenotype is most consistent with high grade carcinoma with sarcomatoid features. Immunohistochemistry for cytokeratin AE1/AE3 is performed on all of the lymph nodes (parts 1 and 2) and no positivity is identified.    11/14/2017 Surgery    Assistants: Dr Lahoma Crocker (an MD assistant was necessary for tissue manipulation, management of robotic instrumentation, retraction and positioning due to the complexity of the case and hospital policies).  Operation: Robotic-assisted laparoscopic total hysterectomy with bilateral salpingoophorectomy, SLN biopsy, right pelvic lymphadenectomy  Surgeon: Donaciano Eva  Operative Findings:  : 12cm bulky broad fibroid uterus. Pelvic adnexal adhesions between right ovary and fallopian tube and pelvic sidewall. Bladder adhesions from prior cesarean section. Grossly normal appearing ovaries. No apparent extrauterine disease. Multiple subserosal fibroids. Unilateral dye mapping to left. No suspicious lymph nodes.   Estimated Blood Loss:  less than 50 mL      Total IV Fluids: 800 ml         Specimens: uterus, cervix, bilateral tubes and ovaries, right pelvic lymph nodes, left external iliac sentinel lymph node.          Complications:  None; patient tolerated the procedure well.      11/16/2017 Imaging    CT head No acute intracranial abnormalities to explain the patient's symptoms.    11/24/2017 Cancer Staging    Staging form: Corpus Uteri - Carcinoma and Carcinosarcoma, AJCC 8th Edition - Pathologic: Stage IA (pT1a, pN0, cM0) - Signed by Heath Lark, MD on 11/24/2017    12/06/2017 Procedure    Status post right IJ port catheter placement. Catheter ready for use    12/14/2017 -  Chemotherapy    The patient had carboplatin and taxol    12/22/2017 Imaging    1. No convincing evidence of fistula  between the lower urinary tract and vagina. 2. There is a small low-attenuation collection in the pelvic cul-de-sac adjacent to the vaginal cuff measuring 2.6 x 1.9 x 2.0 cm (volume = 5.2 cm^3). This may represent a small amount of ascites or postoperative hematoma/seroma and could represent a potential source for vaginal discharge.      REVIEW OF SYSTEMS:   Constitutional: Denies fevers, chills or abnormal weight loss Eyes: Denies blurriness of vision Ears, nose, mouth, throat, and face: Denies mucositis or sore throat Respiratory: Denies cough, dyspnea or wheezes Cardiovascular: Denies palpitation, chest discomfort or lower extremity swelling Gastrointestinal:  Denies nausea, heartburn or change in bowel habits Skin: Denies abnormal skin rashes Lymphatics: Denies new lymphadenopathy or easy bruising Neurological:Denies numbness, tingling or new weaknesses Behavioral/Psych: Mood is stable, no new changes  All other systems were reviewed with the patient and are negative.  I have reviewed the past medical history, past surgical history, social history and family history with the patient and they are unchanged from previous note.  ALLERGIES:  is allergic to vicodin [hydrocodone-acetaminophen].  MEDICATIONS:  Current Outpatient Medications  Medication Sig Dispense Refill  . acetaminophen (TYLENOL) 500 MG  tablet Take 1,000 mg by mouth every 6 (six) hours as needed.    Marland Kitchen amLODipine (NORVASC) 10 MG tablet Take 1 tablet (10 mg total) by mouth daily. 30 tablet 6  . dexamethasone (DECADRON) 4 MG tablet Take 5 tabs at the night before and 5 tab the morning of chemotherapy, every 3 weeks, by mouth 60 tablet 0  . furosemide (LASIX) 40 MG tablet Take 1 tablet (40 mg total) by mouth 2 (two) times daily. For 1 week then resume 1 tablet daily 45 tablet 3  . hydrALAZINE (APRESOLINE) 25 MG tablet Take 1 tablet (25 mg total) by mouth 3 (three) times daily. 90 tablet 6  . ibuprofen (ADVIL,MOTRIN) 200 MG  tablet Take 400 mg by mouth every 6 (six) hours as needed.    . lidocaine-prilocaine (EMLA) cream Apply to affected area once 30 g 3  . magic mouthwash w/lidocaine SOLN Take 5 mLs by mouth 4 (four) times daily as needed for mouth pain. Swish and spit 140 mL 0  . metoprolol tartrate (LOPRESSOR) 25 MG tablet Take 1 tablet (25 mg total) by mouth 2 (two) times daily. 60 tablet 6  . ondansetron (ZOFRAN) 8 MG tablet Take 1 tablet (8 mg total) by mouth every 8 (eight) hours as needed for refractory nausea / vomiting. Start on day 3 after chemo. 30 tablet 1  . polyethylene glycol (MIRALAX / GLYCOLAX) packet Take 17 g by mouth daily as needed for mild constipation.     . prochlorperazine (COMPAZINE) 10 MG tablet Take 1 tablet (10 mg total) by mouth every 6 (six) hours as needed (Nausea or vomiting). 30 tablet 1  . senna (SENOKOT) 8.6 MG TABS tablet Take 1 tablet by mouth daily.    . Vitamin D, Ergocalciferol, (DRISDOL) 50000 units CAPS capsule Take 1 capsule (50,000 Units total) by mouth every 7 (seven) days. 12 capsule 0   No current facility-administered medications for this visit.    Facility-Administered Medications Ordered in Other Visits  Medication Dose Route Frequency Provider Last Rate Last Dose  . CARBOplatin (PARAPLATIN) 600 mg in sodium chloride 0.9 % 250 mL chemo infusion  600 mg Intravenous Once Alvy Bimler, Tivis Wherry, MD      . heparin lock flush 100 unit/mL  500 Units Intracatheter Once PRN Alvy Bimler, Jaceion Aday, MD      . PACLitaxel (TAXOL) 264 mg in sodium chloride 0.9 % 250 mL chemo infusion (> 56m/m2)  1644.0347mg/m2 (Treatment Plan Recorded) Intravenous Once GHeath Lark MD 98 mL/hr at 04/05/18 1358 264 mg at 04/05/18 1358  . sodium chloride flush (NS) 0.9 % injection 10 mL  10 mL Intracatheter PRN GAlvy Bimler Kailen Hinkle, MD        PHYSICAL EXAMINATION: ECOG PERFORMANCE STATUS: 1 - Symptomatic but completely ambulatory  Vitals:   04/05/18 1057  BP: (!) 147/77  Pulse: 65  Resp: 18  Temp: 97.7 F (36.5 C)   SpO2: 100%   Filed Weights   04/05/18 1057  Weight: 258 lb 9.6 oz (117.3 kg)    GENERAL:alert, no distress and comfortable SKIN: skin color, texture, turgor are normal, no rashes or significant lesions EYES: normal, Conjunctiva are pink and non-injected, sclera clear OROPHARYNX:no exudate, no erythema and lips, buccal mucosa, and tongue normal  NECK: supple, thyroid normal size, non-tender, without nodularity LYMPH:  no palpable lymphadenopathy in the cervical, axillary or inguinal LUNGS: clear to auscultation and percussion with normal breathing effort HEART: regular rate & rhythm and no murmurs and no lower extremity edema ABDOMEN:abdomen soft, non-tender  and normal bowel sounds Musculoskeletal:no cyanosis of digits and no clubbing  NEURO: alert & oriented x 3 with fluent speech, no focal motor/sensory deficits  LABORATORY DATA:  I have reviewed the data as listed    Component Value Date/Time   NA 143 04/05/2018 1025   NA 141 03/29/2018 0000   K 3.6 04/05/2018 1025   CL 108 04/05/2018 1025   CO2 25 04/05/2018 1025   GLUCOSE 101 (H) 04/05/2018 1025   BUN 24 (H) 04/05/2018 1025   BUN 32 (H) 03/29/2018 0000   CREATININE 1.07 (H) 04/05/2018 1025   CALCIUM 9.2 04/05/2018 1025   PROT 7.4 04/05/2018 1025   ALBUMIN 3.8 04/05/2018 1025   AST 15 04/05/2018 1025   ALT 18 04/05/2018 1025   ALKPHOS 98 04/05/2018 1025   BILITOT 0.4 04/05/2018 1025   GFRNONAA 57 (L) 04/05/2018 1025   GFRAA >60 04/05/2018 1025    No results found for: SPEP, UPEP  Lab Results  Component Value Date   WBC 4.1 04/05/2018   NEUTROABS 2.6 04/05/2018   HGB 10.1 (L) 04/05/2018   HCT 31.3 (L) 04/05/2018   MCV 96.9 04/05/2018   PLT 205 04/05/2018      Chemistry      Component Value Date/Time   NA 143 04/05/2018 1025   NA 141 03/29/2018 0000   K 3.6 04/05/2018 1025   CL 108 04/05/2018 1025   CO2 25 04/05/2018 1025   BUN 24 (H) 04/05/2018 1025   BUN 32 (H) 03/29/2018 0000   CREATININE 1.07  (H) 04/05/2018 1025      Component Value Date/Time   CALCIUM 9.2 04/05/2018 1025   ALKPHOS 98 04/05/2018 1025   AST 15 04/05/2018 1025   ALT 18 04/05/2018 1025   BILITOT 0.4 04/05/2018 1025      All questions were answered. The patient knows to call the clinic with any problems, questions or concerns. No barriers to learning was detected.  I spent 25 minutes counseling the patient face to face. The total time spent in the appointment was 30 minutes and more than 50% was on counseling and review of test results  Heath Lark, MD 04/05/2018 3:30 PM

## 2018-04-05 NOTE — Assessment & Plan Note (Signed)
She has diffuse nonspecific pain which I do not think is related to chemotherapy Previously, she was found to have illicit drug use I do not plan to prescribe narcotic prescription due to high risk of abuse and dependency I recommend physical therapy and rehab, along with vitamin D replacement therapy

## 2018-04-05 NOTE — Assessment & Plan Note (Signed)
This is likely due to recent treatment. The patient denies recent history of bleeding such as epistaxis, hematuria or hematochezia. She is asymptomatic from the anemia. I will observe for now.   

## 2018-04-05 NOTE — Assessment & Plan Note (Signed)
She has intermittent acute renal failure I will keep her carboplatin dose stable We will continue risk factor modification including tight control of blood pressure

## 2018-04-05 NOTE — Patient Instructions (Signed)
[facility] Discharge Instructions for Patients Receiving Chemotherapy  Today you received the following chemotherapy agents: Taxol & Carboplatin.  To help prevent nausea and vomiting after your treatment, we encourage you to take your nausea medication as prescribed.  Do not take zofran for three days following your infusion, take compazine instead.   If you develop nausea and vomiting that is not controlled by your nausea medication, call the clinic.   BELOW ARE SYMPTOMS THAT SHOULD BE REPORTED IMMEDIATELY:  *FEVER GREATER THAN 100.5 F  *CHILLS WITH OR WITHOUT FEVER  NAUSEA AND VOMITING THAT IS NOT CONTROLLED WITH YOUR NAUSEA MEDICATION  *UNUSUAL SHORTNESS OF BREATH  *UNUSUAL BRUISING OR BLEEDING  TENDERNESS IN MOUTH AND THROAT WITH OR WITHOUT PRESENCE OF ULCERS  *URINARY PROBLEMS  *BOWEL PROBLEMS  UNUSUAL RASH Items with * indicate a potential emergency and should be followed up as soon as possible.  Feel free to call the clinic should you have any questions or concerns. The clinic phone number is [phone].  Please show the CHEMO ALERT CARD at check-in to the Emergency Department and triage nurse. 

## 2018-04-05 NOTE — Assessment & Plan Note (Signed)
She has profound vitamin D deficiency that would contribute to her joint pain She is on high-dose vitamin D replacement therapy Plan to recheck vitamin D level again in December

## 2018-04-05 NOTE — Telephone Encounter (Signed)
Gave avs and calendar ° °

## 2018-04-05 NOTE — Assessment & Plan Note (Addendum)
She tolerated treatment well except for other major comorbidities including diffuse nonspecific pain, weakness and uncontrolled hypertension I plan to continue on minor dose adjustment due to multiple side effects I will keep carboplatin at 600 mg for now due to fluctuation of kidney function I plan to repeat CT scan of the chest, abdomen and pelvis after 6 cycles of treatment to assess response to therapy. If CT scan show no evidence of disease, we will get her port removed

## 2018-04-09 ENCOUNTER — Encounter: Payer: Self-pay | Admitting: Radiation Oncology

## 2018-04-09 ENCOUNTER — Telehealth: Payer: Self-pay | Admitting: *Deleted

## 2018-04-09 ENCOUNTER — Other Ambulatory Visit: Payer: Self-pay

## 2018-04-09 ENCOUNTER — Ambulatory Visit
Admission: RE | Admit: 2018-04-09 | Discharge: 2018-04-09 | Disposition: A | Discharge status: Home / Self Care | Payer: Medicaid Other | Source: Ambulatory Visit | Attending: Radiation Oncology | Admitting: Radiation Oncology

## 2018-04-09 VITALS — BP 150/88 | HR 63 | Temp 97.5°F | Resp 18 | Wt 262.1 lb

## 2018-04-09 DIAGNOSIS — Z9221 Personal history of antineoplastic chemotherapy: Secondary | ICD-10-CM | POA: Insufficient documentation

## 2018-04-09 DIAGNOSIS — Z79899 Other long term (current) drug therapy: Secondary | ICD-10-CM | POA: Insufficient documentation

## 2018-04-09 DIAGNOSIS — C541 Malignant neoplasm of endometrium: Secondary | ICD-10-CM | POA: Insufficient documentation

## 2018-04-09 DIAGNOSIS — Z923 Personal history of irradiation: Secondary | ICD-10-CM | POA: Insufficient documentation

## 2018-04-09 DIAGNOSIS — Z885 Allergy status to narcotic agent status: Secondary | ICD-10-CM | POA: Insufficient documentation

## 2018-04-09 NOTE — Progress Notes (Signed)
Radiation Oncology         [phone] ________________________________  Name: Angela Gardner MRN: [id number]  Date: 04/09/2018  DOB: 05-Jul-1960  Follow-Up Visit Note  CC: Charlott Rakes, MD  Charlott Rakes, MD    ICD-10-CM   1. 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 She is doing well overall. She is accompanied by a family member today. She doesn't have any upcoming appointments with her GYN-Oncologist.                She reports associated BLE swelling and pain. She notes that she has BLE swelling and pain following chemotherapy, which she most recently had on 04/05/2018. She has one more cycle of chemotherapy remaining and she states that her BLE swelling/pain typically last for 6 days following treatment. She denies vaginal bleeding, vaginal discharge, and any other symptoms.                  ALLERGIES:  is allergic to vicodin [hydrocodone-acetaminophen].  Meds: Current Outpatient Medications  Medication Sig Dispense Refill  . acetaminophen (TYLENOL) 500 MG tablet Take 1,000 mg by mouth every 6 (six) hours as needed.    Marland Kitchen amLODipine (NORVASC) 10 MG tablet Take 1 tablet (10 mg total) by mouth daily. 30 tablet 6  . dexamethasone (DECADRON) 4 MG tablet Take 5 tabs at the night before and 5 tab the morning of chemotherapy, every 3 weeks, by mouth 60 tablet 0  . furosemide (LASIX) 40 MG tablet Take 1 tablet (40 mg total) by mouth 2 (two) times daily. For 1 week then resume 1 tablet daily 45 tablet 3  . hydrALAZINE (APRESOLINE) 25 MG tablet Take 1 tablet (25 mg total) by mouth 3 (three) times daily. 90 tablet 6  . ibuprofen (ADVIL,MOTRIN) 200 MG tablet Take 400 mg by mouth  every 6 (six) hours as needed.    . lidocaine-prilocaine (EMLA) cream Apply to affected area once 30 g 3  . magic mouthwash w/lidocaine SOLN Take 5 mLs by mouth 4 (four) times daily as needed for mouth pain. Swish and spit 140 mL 0  . metoprolol tartrate (LOPRESSOR) 25 MG tablet Take 1 tablet (25 mg total) by mouth 2 (two) times daily. 60 tablet 6  . ondansetron (ZOFRAN) 8 MG tablet Take 1 tablet (8 mg total) by mouth every 8 (eight) hours as needed for refractory nausea / vomiting. Start on day 3 after chemo. 30 tablet 1  . polyethylene glycol (MIRALAX / GLYCOLAX) packet Take 17 g by mouth daily as needed for mild constipation.     . prochlorperazine (COMPAZINE) 10 MG tablet Take 1 tablet (10 mg total) by mouth every 6 (six) hours as needed (Nausea or vomiting). 30 tablet 1  . senna (SENOKOT) 8.6 MG TABS tablet Take 1 tablet by mouth daily.    . Vitamin D, Ergocalciferol, (DRISDOL) 50000 units CAPS capsule Take 1 capsule (50,000 Units total) by mouth every 7 (seven) days. 12 capsule 0   No current facility-administered medications for this encounter.     Physical Findings: The patient is in no acute distress. Patient is alert and oriented.  weight is 262 lb  2 oz (118.9 kg). Her oral temperature is 97.5 F (36.4 C) (abnormal). Her blood pressure is 150/88 (abnormal) and her pulse is 63. Her respiration is 18 and oxygen saturation is 99%.    Lungs are clear to auscultation bilaterally. Heart has regular rate and rhythm. No palpable cervical, supraclavicular, or axillary adenopathy. Abdomen soft, non-tender, normal bowel sounds. Pelvic exam deferred today due to recent completion of treatment.  Lab Findings: Lab Results  Component Value Date   WBC 4.1 04/05/2018   HGB 10.1 (L) 04/05/2018   HCT 31.3 (L) 04/05/2018   MCV 96.9 04/05/2018   PLT 205 04/05/2018    Radiographic Findings: No results found.  Impression:  Clinically stable. Patient is having side effects related to her  chemotherapy, but only has one more cycle to complete her treatments.   Plan:  Follow up in Radiation Oncology in 5 months. Patient will follow up with Dr. Denman George in 2 months. Patient was given a vaginal dilator and instructions on its use.   ____________________________________   Blair Promise, PhD, MD    This document serves as a record of services personally performed by Gery Pray, MD. It was created on his behalf by [facility], a trained medical scribe. The creation of this record is based on the scribe's personal observations and the provider's statements to them. This document has been checked and approved by the attending provider.

## 2018-04-09 NOTE — Telephone Encounter (Signed)
CALLED PATIENT TO INFORM OF FU WITH DR. Denman George ON 05-25-18 - ARRIVAL TIME- 10:45 AM @ [facility], LVM FOR A RETURN CALL

## 2018-04-09 NOTE — Progress Notes (Signed)
Pt presents today for one month f/u with Dr. Meryl Crutch [facility] H[facility]. Pt presents today with sister. Pt c/o pain, 10/10 in BLE and bilateral fingers. Pt attributes this pain to chemotherapy infusion side effects. Last infusion was 04/05/18. Pt denies dysuria/hematuria. Pt denies vaginal bleeding/discharge. Pt denies rectal bleeding, diarrhea/constipation. Pt denies abdominal bloating, N/V. Dilator teaching given with good understanding. Loma Sousa, RN BSN

## 2018-04-09 NOTE — Telephone Encounter (Signed)
Shirley from radiation called and scheduled a 2 month follow up appt with Dr. Denman George. She will give the patient the appt date/time.

## 2018-04-11 ENCOUNTER — Other Ambulatory Visit: Payer: Self-pay

## 2018-04-11 ENCOUNTER — Ambulatory Visit: Payer: Self-pay | Attending: Family Medicine

## 2018-04-11 DIAGNOSIS — M25562 Pain in left knee: Secondary | ICD-10-CM | POA: Insufficient documentation

## 2018-04-11 DIAGNOSIS — G8929 Other chronic pain: Secondary | ICD-10-CM | POA: Insufficient documentation

## 2018-04-11 DIAGNOSIS — R2689 Other abnormalities of gait and mobility: Secondary | ICD-10-CM | POA: Insufficient documentation

## 2018-04-11 NOTE — Therapy (Signed)
[place], [place], [address] Phone: [phone]   Fax:  [phone]  Physical Therapy Evaluation  Patient Details  Name: Angela Gardner MRN: [id number] Date of Birth: [birth year] Referring Provider (PT): Charlott Rakes   Encounter Date: 04/11/2018  PT End of Session - 04/11/18 1211    Visit Number  1    Number of Visits  4    Authorization Type  MCD- needs auth    Authorization Time Period  3 weeks    Authorization - Visit Number  1    Authorization - Number of Visits  4    PT Start Time  1017    PT Stop Time  1102    PT Time Calculation (min)  45 min    Activity Tolerance  Patient tolerated treatment well    Behavior During Therapy  [facility] for tasks assessed/performed       Past Medical History:  Diagnosis Date  . Anemia   . 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 CHF (congestive heart failure) ([place])   . Dyspnea    still having this and not moving around alot -gets worse with exertion  . History of sleep walking   . Hx of left breast biopsy   . Hyperlipemia   . Hypertension    not on any medication now-[facility] had  prescribed her a med and she stopped taking them cannot remember when    Past Surgical History:  Procedure Laterality Date  . BREAST BIOPSY     left breast  . CESAREAN SECTION     one  . CHOLECYSTECTOMY    . DILATION AND CURETTAGE OF UTERUS     approx 1 year ago  . IR IMAGING GUIDED PORT INSERTION  12/06/2017  . LYMPH NODE BIOPSY N/A 11/14/2017   Procedure: SENTINEL LYMPH NODE BIOPSY;  Surgeon: Everitt Amber, MD;  Location: [facility];  Service: Gynecology;  Laterality: N/A;  . ROBOTIC ASSISTED TOTAL HYSTERECTOMY WITH BILATERAL SALPINGO OOPHERECTOMY N/A 11/14/2017   Procedure: XI ROBOTIC ASSISTED TOTAL HYSTERECTOMY WITH BILATERAL SALPINGO OOPHORECTOMY, RIGHT PELVIC LYMPHADENECTOMY;  Surgeon: Everitt Amber, MD;  Location: [facility];  Service: Gynecology;  Laterality: N/A;    There  were no vitals filed for this visit.   Subjective Assessment - 04/11/18 1024    Subjective  Patient reports two recent falls.  Pain started with taking the chemo and falling has made it worse (fell on the same leg in kitchen and coming down the steps).  Did not have x-rays    Patient is accompained by:  Family member    Pertinent History  Endometrial cancer treatments since March and fell after treatments started    Limitations  Walking    How long can you sit comfortably?  unlim    How long can you stand comfortably?  less than 10 minutes in shower    How long can you walk comfortably?  walks 5 min    Patient Stated Goals  Walk better, less fall risk    Currently in Pain?  Yes    Pain Score  5     Pain Location  Leg    Pain Orientation  Right;Left    Pain Descriptors / Indicators  Aching   feels sharp pain comes from chemo   Pain Type  Chronic pain    Pain Onset  More than a month ago    Pain Frequency  Constant    Aggravating Factors  standing and walking    Pain Relieving Factors  sit, heating pad, ibuprofen    Effect of Pain on Daily Activities  limits walking, limits standing in shower         [facility] PT Assessment - 04/11/18 1030      Assessment   Medical Diagnosis  L knee pain    Referring Provider (PT)  Charlane Ferretti [place]    Onset Date/Surgical Date  01/18/18    Next MD Visit  unknown    Prior Therapy  none      Precautions   Precautions  Fall      Restrictions   Weight Bearing Restrictions  No      Balance Screen   Has the patient fallen in the past 6 months  Yes    How many times?  2    Has the patient had a decrease in activity level because of a fear of falling?   Yes    Is the patient reluctant to leave their home because of a fear of falling?   Yes      [place]  Private residence    Living Arrangements  Other relatives   sister   Type of [place] to enter    Entrance Stairs-Number of Steps  3     Entrance Stairs-Rails  Right    Home Layout  One level    [place] - single point;Walker - 4 wheels      Prior Function   Level of Independence  Needs assistance with gait;Needs assistance with ADLs   cane for ambulation, sits on toilet to start washing   Vocation  Other (comment)   trying for disability     Cognition   Overall Cognitive Status  Within Functional Limits for tasks assessed    Memory  Appears intact      Observation/Other Assessments   Observations  Ambulates with cane slow gait with short stride length and mildly antalgic on L      Observation/Other Assessments-Edema    Edema  --   noted edema with obervation esp medial aspect     Sensation   Light Touch  Appears Intact   in LE, tingling in hands     Posture/Postural Control   Posture/Postural Control  Postural limitations    Postural Limitations  Rounded Shoulders;Forward head;Weight shift right;Increased thoracic kyphosis      ROM / Strength   AROM / PROM / Strength  AROM;Strength      AROM   AROM Assessment Site  Knee    Right/Left Knee  Left    Left Knee Extension  3    Left Knee Flexion  113      Strength   Strength Assessment Site  Hip;Knee;Ankle    Right/Left Hip  Left    Left Hip Flexion  2+/5    Right/Left Knee  Left    Left Knee Flexion  3+/5    Left Knee Extension  4-/5    Right/Left Ankle  Left    Left Ankle Dorsiflexion  4-/5      Palpation   Patella mobility  mobile in all directions, but painful and limited      Special Tests    Special Tests  Knee Special Tests;Meniscus Tests    Knee Special tests   other    Meniscus Tests  Apley's Compression      other  Findings  Negative    Side   Left    Comments  ligament stability (medial/lateral stress, Ant/Post drawer      Apley's Compression   Findings  Positive    Side   Left    Comments  pain      Transfers   Transfers  Sit to Stand;Stand to Sit    Sit to Stand  6: Modified independent (Device/Increase  time)   increased time, needs UE support   Stand to Sit  6: Modified independent (Device/Increase time)   increased time, needs UE support     Ambulation/Gait   Ambulation/Gait  Yes    Ambulation/Gait Assistance  5: Supervision    Ambulation/Gait Assistance Details  slow pace, decreased stride length, reliant on cane and flexed posture throughout    Ambulation Distance (Feet)  70 Feet    Assistive device  Straight cane    Gait Pattern  Step-to pattern;Step-through pattern;Decreased stride length;Antalgic;Trunk flexed    Ambulation Surface  Level;Indoor                Objective measurements completed on examination: See above findings.              PT Education - 04/11/18 1211    Education Details  HEP, PT POC    Person(s) Educated  Patient    Methods  Explanation;Handout    Comprehension  Returned demonstration       PT Short Term Goals - 04/11/18 1220      PT SHORT TERM GOAL #1   Title  Patient will demonstrate initial HEP with written instructions independently    Baseline  No current HEP    Time  3    Period  Weeks    Status  New    Target Date  05/09/18      PT SHORT TERM GOAL #2   Title  Patient will report ability to stand for 10 minute prior to needing to sit due to pain    Baseline  Reports can stand less than 10 minutes    Time  3    Period  Weeks    Status  New    Target Date  05/09/18        PT Long Term Goals - 04/11/18 1222      PT LONG TERM GOAL #1   Title  Patient to demonstrate SLR L LE independent without pain.    Baseline  unable to perform SLR unaided and increases pain    Time  6    Period  Weeks    Status  New    Target Date  05/30/18      PT LONG TERM GOAL #2   Title  Patient will tolerate standing for 15 minutes to allow for ADL completion prior to need to sit.    Baseline  Can stand less than 10 minutes    Time  6    Period  Weeks    Target Date  05/30/18      PT LONG TERM GOAL #3   Title  Patient will  reports pain in knee improved by 50% overall    Baseline  Patient reports pain today at 5/10 increased with testing 8/10 max    Time  6    Period  Weeks    Status  New    Target Date  05/30/18      PT LONG TERM GOAL #4   Title  Patient to report no falls  since starting PT    Baseline  2 falls in 6 months    Time  6    Period  Weeks    Status  New    Target Date  05/30/18             Plan - 04/11/18 1212    Clinical Impression Statement  Patient presents with L knee pain from recent falls and exacerbated by chemotherapy regimine for endometrial cancer.  She reports limitations in walking, standing and showering and would like to improve walking.  She has decreased strength, painful ROM, decreased mobility currently using a cane with limited gait speed and high fall risk with 2 prior falls.  She will benefit from skilled PT to progress mobility, safety and decrease fall risk as well as improve pain.     History and Personal Factors relevant to plan of care:  Current treatment for endometrial cancer    Clinical Presentation  Unstable    Clinical Presentation due to:  limited gait, previous falls    Clinical Decision Making  Moderate    Rehab Potential  Good    Clinical Impairments Affecting Rehab Potential  ongoing chemo    PT Frequency  1x / week    PT Duration  3 weeks    PT Treatment/Interventions  Functional mobility training;Gait training;DME Instruction;ADLs/Self Care Home Management;Patient/family education;Therapeutic activities;Therapeutic exercise;Balance training;Taping;Manual techniques;Stair training;Electrical Stimulation;Cryotherapy;Moist Heat    PT Next Visit Plan  assess gait/balance (TUG, 10MWT for fall risk); initiate sit<>stand, standing theraband for HEP; taping for pes anserine edema    PT Home Exercise Plan  Quad Sets, Straight leg raises    Consulted and Agree with Plan of Care  Patient;Family member/caregiver    Family Member Consulted  sister        Patient will benefit from skilled therapeutic intervention in order to improve the following deficits and impairments:  Abnormal gait, Pain, Postural dysfunction, Decreased mobility, Decreased activity tolerance, Decreased balance, Impaired perceived functional ability, Decreased strength, Decreased endurance, Increased edema, Impaired flexibility  Visit Diagnosis: Chronic pain of left knee - Plan: PT plan of care cert/re-cert  Other abnormalities of gait and mobility - Plan: PT plan of care cert/re-cert     Problem List Patient Active Problem List   Diagnosis Date Noted  . 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 Depression 04/02/2018  . Prediabetes 04/02/2018  . Vitamin D deficiency 02/15/2018  . Diffuse pain 01/04/2018  . Peripheral neuropathy due to chemotherapy ([place]) 01/04/2018  . 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 Other constipation 11/28/2017  . Illicit drug use 25/95/6387  . Chronic kidney disease (CKD), stage III (moderate) ([place]) 10/30/2017  . Essential hypertension 10/27/2017  . Diastolic congestive heart failure, NYHA class 3 ([place]) 10/27/2017  . Endometrial cancer ([place]) 10/19/2017  . Morbid obesity ([place]) 10/09/2017  . Acute exacerbation of CHF (congestive heart failure) ([place]) 09/13/2017  . Hypertensive crisis 09/12/2017  . Mild renal insufficiency 09/12/2017  . Dysfunctional uterine bleeding 09/12/2017  . Acute CHF (congestive heart failure) ([place]) 09/12/2017  . Chest discomfort 09/12/2017  . PMB (postmenopausal bleeding) 06/17/2015  . 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 OTITIS MEDIA 12/04/2008  . ACUTE BRONCHITIS 12/04/2008    Reginia Naas, PT 04/11/2018, 1:52 PM  [facility] [address] [place], [place], [address] Phone: [phone]   Fax:  [phone]  Name: Daryana Whirley MRN: [id number] Date of Birth: 07/21/1960

## 2018-04-11 NOTE — Patient Instructions (Signed)
Supine Quad Set   reps: 10 sets: 1   hold: 5 daily: 2   weekly: 7   Supine Straight Leg Raises   reps: 10 sets: 2   daily: 1 weekly: 7

## 2018-04-16 ENCOUNTER — Other Ambulatory Visit (HOSPITAL_COMMUNITY): Payer: Self-pay | Admitting: *Deleted

## 2018-04-16 DIAGNOSIS — Z1231 Encounter for screening mammogram for malignant neoplasm of breast: Secondary | ICD-10-CM

## 2018-04-19 ENCOUNTER — Ambulatory Visit: Payer: Self-pay

## 2018-04-19 DIAGNOSIS — M25562 Pain in left knee: Principal | ICD-10-CM

## 2018-04-19 DIAGNOSIS — R2689 Other abnormalities of gait and mobility: Secondary | ICD-10-CM

## 2018-04-19 DIAGNOSIS — G8929 Other chronic pain: Secondary | ICD-10-CM

## 2018-04-19 NOTE — Patient Instructions (Signed)
Bridging, supine clams, clam Hip Flexion - Supine    Lying on back, knees bent, feet on floor, bend hips bringing knee toward trunk.  ONE KNEE AT A TIME Repeat _10__ times. Do __1_ times per day.  Copyright  VHI. All rights reserved.  HIP: Abduction - Side-Lying    Lie on side, legs straight and in line with trunk. Squeeze glutes. Raise top leg up and slightly back. Point toes forward. _10__ reps per set, _1_ sets per day, _1__ days per week Bend bottom leg to stabilize pelvis.   RT/LT  Copyright  VHI. All rights reserved.  Clam    Lie on side, legs bent 90. Open top knee to ceiling, rotating leg outward. Touch toes to ankle of bottom leg. Close knees, rotating leg inward. Maintain hip position. Repeat __10__ times. Repeat on other side. Do __1__ sessions per day.  http://pm.exer.us/69   Copyright  VHI. All rights reserved.  Bridging    Slowly raise buttocks from floor, keeping stomach tight. Repeat __10__ times per set. Do ___1_ sets per session. Do __1__ sessions per day.  http://orth.exer.us/1097   Copyright  VHI. All rights reserved.

## 2018-04-19 NOTE — Therapy (Signed)
[place], [place], [address] Phone: [phone]   Fax:  [phone]  Physical Therapy Treatment  Patient Details  Name: Angela Gardner MRN: [id number] Date of Birth: [birth year] Referring Provider (PT): Charlott Rakes   Encounter Date: 04/19/2018  PT End of Session - 04/19/18 0929    Visit Number  2    Number of Visits  4    Authorization Type  MCD- needs auth,  CAFA    PT Start Time  0930    PT Stop Time  1015    PT Time Calculation (min)  45 min    Activity Tolerance  Patient tolerated treatment well    Behavior During Therapy  [facility] for tasks assessed/performed       Past Medical History:  Diagnosis Date  . Anemia   . 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 CHF (congestive heart failure) ([place])   . Dyspnea    still having this and not moving around alot -gets worse with exertion  . History of sleep walking   . Hx of left breast biopsy   . Hyperlipemia   . Hypertension    not on any medication now-[facility] had  prescribed her a med and she stopped taking them cannot remember when    Past Surgical History:  Procedure Laterality Date  . BREAST BIOPSY     left breast  . CESAREAN SECTION     one  . CHOLECYSTECTOMY    . DILATION AND CURETTAGE OF UTERUS     approx 1 year ago  . IR IMAGING GUIDED PORT INSERTION  12/06/2017  . LYMPH NODE BIOPSY N/A 11/14/2017   Procedure: SENTINEL LYMPH NODE BIOPSY;  Surgeon: Everitt Amber, MD;  Location: [facility];  Service: Gynecology;  Laterality: N/A;  . ROBOTIC ASSISTED TOTAL HYSTERECTOMY WITH BILATERAL SALPINGO OOPHERECTOMY N/A 11/14/2017   Procedure: XI ROBOTIC ASSISTED TOTAL HYSTERECTOMY WITH BILATERAL SALPINGO OOPHORECTOMY, RIGHT PELVIC LYMPHADENECTOMY;  Surgeon: Everitt Amber, MD;  Location: [facility];  Service: Gynecology;  Laterality: N/A;    There were no vitals filed for this visit.  Subjective Assessment - 04/19/18 0933    Subjective  She reports doing  HEP . She reports swelling in abdomen today. She reports in bed alot and only walking to use bathroom    Pain Location  Leg    Pain Orientation  Left    Pain Descriptors / Indicators  Aching    Pain Type  Chronic pain    Pain Onset  More than a month ago    Pain Frequency  Constant    Aggravating Factors   stand and walk    Pain Relieving Factors  sit Arby Barrette [facility] Adult PT Treatment/Exercise - 04/19/18 0001      Exercises   Exercises  Ankle      Knee/Hip Exercises: Aerobic   Nustep  L3 5 min      Knee/Hip Exercises: Supine   Bridges  Both;10 reps    Straight Leg Raises  Right;Left;10 reps      Knee/Hip Exercises: Sidelying   Hip ABduction  Right;Left;10 reps    Clams  clam and reverse clam x 10 RT/LT      Ankle Exercises: Stretches   Other Stretch  active DF x 15 LT 3-5 sec hold      Ankle Exercises:  Seated   Other Seated Ankle Exercises  MARCHING AND QS  WITH qs             PT Education - 04/19/18 0947    Education Details  Suggested counting number of times she is up walking during day and to do and extra lap in home each time to increase time on feet.  HEP    Person(s) Educated  Patient    Methods  Explanation;Tactile cues;Verbal cues;Handout    Comprehension  Returned demonstration;Verbalized understanding       PT Short Term Goals - 04/11/18 1220      PT SHORT TERM GOAL #1   Title  Patient will demonstrate initial HEP with written instructions independently    Baseline  No current HEP    Time  3    Period  Weeks    Status  New    Target Date  05/09/18      PT SHORT TERM GOAL #2   Title  Patient will report ability to stand for 10 minute prior to needing to sit due to pain    Baseline  Reports can stand less than 10 minutes    Time  3    Period  Weeks    Status  New    Target Date  05/09/18        PT Long Term Goals - 04/11/18 1222      PT LONG TERM GOAL #1   Title  Patient to demonstrate SLR L LE  independent without pain.    Baseline  unable to perform SLR unaided and increases pain    Time  6    Period  Weeks    Status  New    Target Date  05/30/18      PT LONG TERM GOAL #2   Title  Patient will tolerate standing for 15 minutes to allow for ADL completion prior to need to sit.    Baseline  Can stand less than 10 minutes    Time  6    Period  Weeks    Target Date  05/30/18      PT LONG TERM GOAL #3   Title  Patient will reports pain in knee improved by 50% overall    Baseline  Patient reports pain today at 5/10 increased with testing 8/10 max    Time  6    Period  Weeks    Status  New    Target Date  05/30/18      PT LONG TERM GOAL #4   Title  Patient to report no falls since starting PT    Baseline  2 falls in 6 months    Time  6    Period  Weeks    Status  New    Target Date  05/30/18            Plan - 04/19/18 0930    Clinical Impression Statement  She reports feeling a little better after session. Added to HEP .   Progress next session as able    PT Treatment/Interventions  Functional mobility training;Gait training;DME Instruction;ADLs/Self Care Home Management;Patient/family education;Therapeutic activities;Therapeutic exercise;Balance training;Taping;Manual techniques;Stair training;Electrical Stimulation;Cryotherapy;Moist Heat    PT Next Visit Plan  assess gait/balance (TUG, 10MWT for fall risk); initiate sit<>stand, standing theraband for HEP; taping for pes anserine edema    PT Home Exercise Plan  Quad Sets, Straight leg raises, bridge, clam and reverse dclam.  hip flexion,  hip abducition  Consulted and Agree with Plan of Care  Patient       Patient will benefit from skilled therapeutic intervention in order to improve the following deficits and impairments:  Abnormal gait, Pain, Postural dysfunction, Decreased mobility, Decreased activity tolerance, Decreased balance, Impaired perceived functional ability, Decreased strength, Decreased endurance,  Increased edema, Impaired flexibility  Visit Diagnosis: Chronic pain of left knee  Other abnormalities of gait and mobility     Problem List Patient Active Problem List   Diagnosis Date Noted  . 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 Depression 04/02/2018  . Prediabetes 04/02/2018  . Vitamin D deficiency 02/15/2018  . Diffuse pain 01/04/2018  . Peripheral neuropathy due to chemotherapy ([place]) 01/04/2018  . 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 Other constipation 11/28/2017  . Illicit drug use 49/67/5916  . Chronic kidney disease (CKD), stage III (moderate) ([place]) 10/30/2017  . Essential hypertension 10/27/2017  . Diastolic congestive heart failure, NYHA class 3 ([place]) 10/27/2017  . Endometrial cancer ([place]) 10/19/2017  . Morbid obesity ([place]) 10/09/2017  . Acute exacerbation of CHF (congestive heart failure) ([place]) 09/13/2017  . Hypertensive crisis 09/12/2017  . Mild renal insufficiency 09/12/2017  . Dysfunctional uterine bleeding 09/12/2017  . Acute CHF (congestive heart failure) ([place]) 09/12/2017  . Chest discomfort 09/12/2017  . PMB (postmenopausal bleeding) 06/17/2015  . 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 OTITIS MEDIA 12/04/2008  . ACUTE BRONCHITIS 12/04/2008    Darrel Hoover  PT 04/19/2018, 10:10 AM  [facility] [address] [place], [place], [address] Phone: [phone]   Fax:  [phone]  Name: Lashante Fryberger MRN: [id number] Date of Birth: 01/16/1961

## 2018-04-23 ENCOUNTER — Ambulatory Visit: Payer: Self-pay | Attending: Family Medicine

## 2018-04-24 ENCOUNTER — Ambulatory Visit: Payer: Self-pay | Attending: Family Medicine

## 2018-04-24 ENCOUNTER — Telehealth: Payer: Self-pay | Admitting: *Deleted

## 2018-04-24 DIAGNOSIS — G8929 Other chronic pain: Secondary | ICD-10-CM | POA: Insufficient documentation

## 2018-04-24 DIAGNOSIS — M25562 Pain in left knee: Secondary | ICD-10-CM | POA: Insufficient documentation

## 2018-04-24 DIAGNOSIS — R2689 Other abnormalities of gait and mobility: Secondary | ICD-10-CM | POA: Insufficient documentation

## 2018-04-24 NOTE — Therapy (Signed)
[place], [place], [address] Phone: [phone]   Fax:  [phone]  Physical Therapy Treatment  Patient Details  Name: Angela Gardner MRN: [id number] Date of Birth: February 04, 1961 Referring Provider (PT): Charlott Rakes   Encounter Date: 04/24/2018  PT End of Session - 04/24/18 0932    Visit Number  3    Number of Visits  4    Authorization Time Period  3 weeks    Authorization - Visit Number  2    Authorization - Number of Visits  3    PT Start Time  0930    PT Stop Time  1015    PT Time Calculation (min)  45 min    Activity Tolerance  Patient tolerated treatment well    Behavior During Therapy  [facility] for tasks assessed/performed       Past Medical History:  Diagnosis Date  . Anemia   . 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 CHF (congestive heart failure) ([place])   . Dyspnea    still having this and not moving around alot -gets worse with exertion  . History of sleep walking   . Hx of left breast biopsy   . Hyperlipemia   . Hypertension    not on any medication now-[facility] had  prescribed her a med and she stopped taking them cannot remember when    Past Surgical History:  Procedure Laterality Date  . BREAST BIOPSY     left breast  . CESAREAN SECTION     one  . CHOLECYSTECTOMY    . DILATION AND CURETTAGE OF UTERUS     approx 1 year ago  . IR IMAGING GUIDED PORT INSERTION  12/06/2017  . LYMPH NODE BIOPSY N/A 11/14/2017   Procedure: SENTINEL LYMPH NODE BIOPSY;  Surgeon: Everitt Amber, MD;  Location: [facility];  Service: Gynecology;  Laterality: N/A;  . ROBOTIC ASSISTED TOTAL HYSTERECTOMY WITH BILATERAL SALPINGO OOPHERECTOMY N/A 11/14/2017   Procedure: XI ROBOTIC ASSISTED TOTAL HYSTERECTOMY WITH BILATERAL SALPINGO OOPHORECTOMY, RIGHT PELVIC LYMPHADENECTOMY;  Surgeon: Everitt Amber, MD;  Location: [facility];  Service: Gynecology;  Laterality: N/A;    There were no vitals filed for this  visit.  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  Last worked 07/2017.      Pain Score  5     Pain Location  Leg    Pain Orientation  Left    Pain Descriptors / Indicators  Aching    Pain Type  Chronic pain    Pain Onset  More than a month ago    Pain Frequency  Constant    Aggravating Factors   stand and walk    Pain Relieving Factors  sit/ lye [facility] Adult PT Treatment/Exercise - 04/24/18 0001      Exercises   Exercises  Knee/Hip      Knee/Hip Exercises: Aerobic   Nustep  L4 5 min      Knee/Hip Exercises: Supine   Bridges  15 reps    Straight Leg Raises  Right;Left;15 reps    Other Supine Knee/Hip Exercises  bent knee raise x a5 RT /LT       Knee/Hip Exercises: Sidelying  Hip ABduction  Right;Left;10 reps    Clams  clam and reverse clam x 15 RT/LT      Ankle Exercises: Stretches   Set designer               PT Short Term Goals - 04/11/18 1220      PT SHORT TERM GOAL #1   Title  Patient will demonstrate initial HEP with written instructions independently    Baseline  No current HEP    Time  3    Period  Weeks    Status  New    Target Date  05/09/18      PT SHORT TERM GOAL #2   Title  Patient will report ability to stand for 10 minute prior to needing to sit due to pain    Baseline  Reports can stand less than 10 minutes    Time  3    Period  Weeks    Status  New    Target Date  05/09/18        PT Long Term Goals - 04/11/18 1222      PT LONG TERM GOAL #1   Title  Patient to demonstrate SLR L LE independent without pain.    Baseline  unable to perform SLR unaided and increases pain    Time  6    Period  Weeks    Status  New    Target Date  05/30/18      PT LONG TERM GOAL #2   Title  Patient will tolerate standing for 15 minutes to allow for ADL completion prior to need to sit.    Baseline   Can stand less than 10 minutes    Time  6    Period  Weeks    Target Date  05/30/18      PT LONG TERM GOAL #3   Title  Patient will reports pain in knee improved by 50% overall    Baseline  Patient reports pain today at 5/10 increased with testing 8/10 max    Time  6    Period  Weeks    Status  New    Target Date  05/30/18      PT LONG TERM GOAL #4   Title  Patient to report no falls since starting PT    Baseline  2 falls in 6 months    Time  6    Period  Weeks    Status  New    Target Date  05/30/18            Plan - 04/24/18 0933    Clinical Impression Statement  Post session she reported no knee pain . She tolerated incr Reps. She wa on feet for considerable time since last session and suggested she was capable of getting up more and  working up on time may allow return to work for shorter periods if she builds to this.   Asked her to do exercises every other day    PT Treatment/Interventions  Functional mobility training;Gait training;DME Instruction;ADLs/Self Care Home Management;Patient/family education;Therapeutic activities;Therapeutic exercise;Balance training;Taping;Manual techniques;Stair training;Electrical Stimulation;Cryotherapy;Moist Heat    PT Next Visit Plan  assess gait/balance (TUG,?)  initiate sit<>stand, standing theraband for HEP; taping for pes anserine edema if needed.   Medicaid auth. Does not appear there was auth for first 3 visits.     PT Home Exercise Plan  Quad Sets, Straight leg raises, bridge, clam and reverse clam.  hip flexion,  hip abducition    Consulted and Agree with Plan of Care  Patient       Patient will benefit from skilled therapeutic intervention in order to improve the following deficits and impairments:  Abnormal gait, Pain, Postural dysfunction, Decreased mobility, Decreased activity tolerance, Decreased balance, Impaired perceived functional ability, Decreased strength, Decreased endurance, Increased edema, Impaired  flexibility  Visit Diagnosis: Chronic pain of left knee  Other abnormalities of gait and mobility     Problem List Patient Active Problem List   Diagnosis Date Noted  . 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 Depression 04/02/2018  . Prediabetes 04/02/2018  . Vitamin D deficiency 02/15/2018  . Diffuse pain 01/04/2018  . Peripheral neuropathy due to chemotherapy ([place]) 01/04/2018  . 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 Other constipation 11/28/2017  . Illicit drug use 32/05/2481  . Chronic kidney disease (CKD), stage III (moderate) ([place]) 10/30/2017  . Essential hypertension 10/27/2017  . Diastolic congestive heart failure, NYHA class 3 ([place]) 10/27/2017  . Endometrial cancer ([place]) 10/19/2017  . Morbid obesity ([place]) 10/09/2017  . Acute exacerbation of CHF (congestive heart failure) ([place]) 09/13/2017  . Hypertensive crisis 09/12/2017  . Mild renal insufficiency 09/12/2017  . Dysfunctional uterine bleeding 09/12/2017  . Acute CHF (congestive heart failure) ([place]) 09/12/2017  . Chest discomfort 09/12/2017  . PMB (postmenopausal bleeding) 06/17/2015  . 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 OTITIS MEDIA 12/04/2008  . ACUTE BRONCHITIS 12/04/2008    Darrel Hoover  PT 04/24/2018, 10:17 AM  [facility] [address] [place], [place], [address] Phone: [phone]   Fax:  [phone]  Name: Angela Gardner MRN: [id number] Date of Birth: 1960/06/26

## 2018-04-26 ENCOUNTER — Other Ambulatory Visit: Payer: Medicaid Other

## 2018-04-26 ENCOUNTER — Encounter: Payer: Self-pay | Admitting: Nurse Practitioner

## 2018-04-26 ENCOUNTER — Inpatient Hospital Stay: Payer: Self-pay

## 2018-04-26 ENCOUNTER — Telehealth: Payer: Self-pay | Admitting: Nurse Practitioner

## 2018-04-26 ENCOUNTER — Inpatient Hospital Stay: Payer: Self-pay | Attending: Hematology and Oncology | Admitting: Nurse Practitioner

## 2018-04-26 VITALS — BP 132/78 | HR 73 | Temp 98.1°F | Resp 17 | Ht 68.0 in | Wt 262.8 lb

## 2018-04-26 DIAGNOSIS — Z452 Encounter for adjustment and management of vascular access device: Secondary | ICD-10-CM | POA: Insufficient documentation

## 2018-04-26 DIAGNOSIS — R600 Localized edema: Secondary | ICD-10-CM

## 2018-04-26 DIAGNOSIS — G620 Drug-induced polyneuropathy: Secondary | ICD-10-CM

## 2018-04-26 DIAGNOSIS — G8929 Other chronic pain: Secondary | ICD-10-CM

## 2018-04-26 DIAGNOSIS — M25562 Pain in left knee: Secondary | ICD-10-CM

## 2018-04-26 DIAGNOSIS — M545 Low back pain: Secondary | ICD-10-CM

## 2018-04-26 DIAGNOSIS — C541 Malignant neoplasm of endometrium: Secondary | ICD-10-CM

## 2018-04-26 DIAGNOSIS — Z79899 Other long term (current) drug therapy: Secondary | ICD-10-CM

## 2018-04-26 DIAGNOSIS — G629 Polyneuropathy, unspecified: Secondary | ICD-10-CM

## 2018-04-26 DIAGNOSIS — T451X5S Adverse effect of antineoplastic and immunosuppressive drugs, sequela: Secondary | ICD-10-CM

## 2018-04-26 DIAGNOSIS — Z5111 Encounter for antineoplastic chemotherapy: Secondary | ICD-10-CM | POA: Insufficient documentation

## 2018-04-26 DIAGNOSIS — N189 Chronic kidney disease, unspecified: Secondary | ICD-10-CM

## 2018-04-26 LAB — CBC WITH DIFFERENTIAL (CANCER CENTER ONLY)
Abs Immature Granulocytes: 0.09 10*3/uL — ABNORMAL HIGH (ref 0.00–0.07)
BASOS ABS: 0 10*3/uL (ref 0.0–0.1)
Basophils Relative: 0 %
EOS ABS: 0 10*3/uL (ref 0.0–0.5)
Eosinophils Relative: 0 %
HEMATOCRIT: 32.5 % — AB (ref 36.0–46.0)
HEMOGLOBIN: 10.3 g/dL — AB (ref 12.0–15.0)
IMMATURE GRANULOCYTES: 2 %
LYMPHS ABS: 1.1 10*3/uL (ref 0.7–4.0)
LYMPHS PCT: 20 %
MCH: 31.4 pg (ref 26.0–34.0)
MCHC: 31.7 g/dL (ref 30.0–36.0)
MCV: 99.1 fL (ref 80.0–100.0)
Monocytes Absolute: 0.1 10*3/uL (ref 0.1–1.0)
Monocytes Relative: 1 %
NEUTROS ABS: 4.2 10*3/uL (ref 1.7–7.7)
NEUTROS PCT: 77 %
NRBC: 0.7 % — AB (ref 0.0–0.2)
Platelet Count: 226 10*3/uL (ref 150–400)
RBC: 3.28 MIL/uL — ABNORMAL LOW (ref 3.87–5.11)
RDW: 16.5 % — ABNORMAL HIGH (ref 11.5–15.5)
WBC Count: 5.4 10*3/uL (ref 4.0–10.5)

## 2018-04-26 LAB — CMP (CANCER CENTER ONLY)
ALBUMIN: 3.9 g/dL (ref 3.5–5.0)
ALT: 21 U/L (ref 0–44)
AST: 21 U/L (ref 15–41)
Alkaline Phosphatase: 106 U/L (ref 38–126)
Anion gap: 13 (ref 5–15)
BILIRUBIN TOTAL: 0.3 mg/dL (ref 0.3–1.2)
BUN: 33 mg/dL — AB (ref 6–20)
CHLORIDE: 102 mmol/L (ref 98–111)
CO2: 25 mmol/L (ref 22–32)
CREATININE: 1.83 mg/dL — AB (ref 0.44–1.00)
Calcium: 10.1 mg/dL (ref 8.9–10.3)
GFR, Est AFR Am: 34 mL/min — ABNORMAL LOW (ref >60)
GFR, Estimated: 30 mL/min — ABNORMAL LOW (ref >60)
GLUCOSE: 142 mg/dL — AB (ref 70–99)
POTASSIUM: 4.4 mmol/L (ref 3.5–5.1)
Sodium: 140 mmol/L (ref 135–145)
Total Protein: 8 g/dL (ref 6.5–8.1)

## 2018-04-26 MED ORDER — SODIUM CHLORIDE 0.9% FLUSH
10.0000 mL | INTRAVENOUS | Status: DC | PRN
  Administered 2018-04-26: 10 mL
  Filled 2018-04-26: qty 10

## 2018-04-26 MED ORDER — HEPARIN SOD (PORK) LOCK FLUSH 100 UNIT/ML IV SOLN
500.0000 [IU] | Freq: Once | INTRAVENOUS | Status: AC | PRN
  Administered 2018-04-26: 500 [IU]
  Filled 2018-04-26: qty 5

## 2018-04-26 MED ORDER — SODIUM CHLORIDE 0.9 % IV SOLN
Freq: Once | INTRAVENOUS | Status: AC
  Administered 2018-04-26: 11:00:00 via INTRAVENOUS
  Filled 2018-04-26: qty 250

## 2018-04-26 NOTE — Patient Instructions (Signed)
Implanted Port Home Guide An implanted port is a type of central line that is placed under the skin. Central lines are used to provide IV access when treatment or nutrition needs to be given through a person's veins. Implanted ports are used for long-term IV access. An implanted port may be placed because:  You need IV medicine that would be irritating to the small veins in your hands or arms.  You need long-term IV medicines, such as antibiotics.  You need IV nutrition for a long period.  You need frequent blood draws for lab tests.  You need dialysis.  Implanted ports are usually placed in the chest area, but they can also be placed in the upper arm, the abdomen, or the leg. An implanted port has two main parts:  Reservoir. The reservoir is round and will appear as a small, raised area under your skin. The reservoir is the part where a needle is inserted to give medicines or draw blood.  Catheter. The catheter is a thin, flexible tube that extends from the reservoir. The catheter is placed into a large vein. Medicine that is inserted into the reservoir goes into the catheter and then into the vein.  How will I care for my incision site? Do not get the incision site wet. Bathe or shower as directed by your health care provider. How is my port accessed? Special steps must be taken to access the port:  Before the port is accessed, a numbing cream can be placed on the skin. This helps numb the skin over the port site.  Your health care provider uses a sterile technique to access the port. ? Your health care provider must put on a mask and sterile gloves. ? The skin over your port is cleaned carefully with an antiseptic and allowed to dry. ? The port is gently pinched between sterile gloves, and a needle is inserted into the port.  Only "non-coring" port needles should be used to access the port. Once the port is accessed, a blood return should be checked. This helps ensure that the port  is in the vein and is not clogged.  If your port needs to remain accessed for a constant infusion, a clear (transparent) bandage will be placed over the needle site. The bandage and needle will need to be changed every week, or as directed by your health care provider.  Keep the bandage covering the needle clean and dry. Do not get it wet. Follow your health care provider's instructions on how to take a shower or bath while the port is accessed.  If your port does not need to stay accessed, no bandage is needed over the port.  What is flushing? Flushing helps keep the port from getting clogged. Follow your health care provider's instructions on how and when to flush the port. Ports are usually flushed with saline solution or a medicine called heparin. The need for flushing will depend on how the port is used.  If the port is used for intermittent medicines or blood draws, the port will need to be flushed: ? After medicines have been given. ? After blood has been drawn. ? As part of routine maintenance.  If a constant infusion is running, the port may not need to be flushed.  How long will my port stay implanted? The port can stay in for as long as your health care provider thinks it is needed. When it is time for the port to come out, surgery will be   done to remove it. The procedure is similar to the one performed when the port was put in. When should I seek immediate medical care? When you have an implanted port, you should seek immediate medical care if:  You notice a bad smell coming from the incision site.  You have swelling, redness, or drainage at the incision site.  You have more swelling or pain at the port site or the surrounding area.  You have a fever that is not controlled with medicine.  This information is not intended to replace advice given to you by your health care provider. Make sure you discuss any questions you have with your health care provider. Document  Released: 06/06/2005 Document Revised: 11/12/2015 Document Reviewed: 02/11/2013 Elsevier Interactive Patient Education  2017 Elsevier Inc.  

## 2018-04-26 NOTE — Patient Instructions (Signed)

## 2018-04-26 NOTE — Progress Notes (Signed)
  [place] OFFICE PROGRESS NOTE   Diagnosis: Endometrial cancer  INTERVAL HISTORY:   Ms. Budde returns as scheduled.  She completed cycle 5 carboplatin/Taxol 04/05/2018.  She denies nausea/vomiting.  She has occasional mouth sores.  She is able to eat and drink without difficulty but thinks that she may be dehydrated at present..  No diarrhea or constipation.  She has numbness involving 2 fingers on the right hand which predated the start of chemotherapy.  She notes that this is now worse.  She denies abdominal pain.  She has chronic left knee and low back pain.  She continues physical therapy.  Objective:  Vital signs in last 24 hours:  Blood pressure 132/78, pulse 73, temperature 98.1 F (36.7 C), temperature source Oral, resp. rate 17, height 5\' 8"  (1.727 m), weight 262 lb 12.8 oz (119.2 kg), SpO2 96 %.    HEENT: No thrush or ulcers. Resp: Lungs clear bilaterally. Cardio: Regular rate and rhythm. GI: Abdomen soft and nontender.  No hepatomegaly. Vascular: Trace pitting edema at the lower legs bilaterally. Skin: No rash. Port-A-Cath without erythema.   Lab Results:  Lab Results  Component Value Date   WBC 4.1 04/05/2018   HGB 10.1 (L) 04/05/2018   HCT 31.3 (L) 04/05/2018   MCV 96.9 04/05/2018   PLT 205 04/05/2018   NEUTROABS 2.6 04/05/2018    Imaging:  No results found.  Medications: I have reviewed the patient's current medications.  Assessment/Plan: 1. Endometrial cancer currently on active treatment with carboplatin/Taxol with cycle 5 completed 04/05/2018. 2. Chronic kidney disease.  Creatinine today is higher than baseline. 3. Peripheral neuropathy related to chemotherapy.  Taxol has been dose reduced.  Disposition: Ms. Stanback appears stable.  She has completed 5 cycles of carboplatin/Taxol.  We reviewed the labs from today.  The creatinine is higher than her baseline.  I discussed the lab results with Dr. Benay Spice.  We will hold today's  treatment and reschedule for 1 week with repeat labs.  In the meantime she will increase fluid intake.  If the creatinine is at an acceptable level in 1 week the plan is to proceed with the sixth and final cycle of carboplatin/Taxol.  She has follow-up with Dr. Alvy Bimler on 05/29/2018 with CT scans the day prior.  Plan reviewed with Dr. Benay Spice.    Ned Card ANP/GNP-BC   04/26/2018  10:24 AM

## 2018-04-26 NOTE — Telephone Encounter (Signed)
Per 11/7 los.  Put appt in add-on book for approval.  Not able to schedule in system.  Gave patient avs report and will call when appt is scheduled.

## 2018-04-26 NOTE — Progress Notes (Signed)
R/t elevated kidney function labs, no tx today will re-schedule tx for a week out. Lattie Haw NP spoke w/ pt. Pt verbalized understanding.

## 2018-04-27 MED FILL — VIT D2 1.25 MG (50,000 UNIT: 1.25 MG | 84 days supply | Qty: 12 | Fill #0

## 2018-05-01 ENCOUNTER — Ambulatory Visit: Payer: Self-pay | Admitting: Physical Therapy

## 2018-05-01 ENCOUNTER — Encounter: Payer: Self-pay | Admitting: Physical Therapy

## 2018-05-01 DIAGNOSIS — M25562 Pain in left knee: Principal | ICD-10-CM

## 2018-05-01 DIAGNOSIS — R2689 Other abnormalities of gait and mobility: Secondary | ICD-10-CM

## 2018-05-01 DIAGNOSIS — G8929 Other chronic pain: Secondary | ICD-10-CM

## 2018-05-01 NOTE — Therapy (Signed)
[place], [place], [address] Phone: [phone]   Fax:  [phone]  Physical Therapy Treatment  Patient Details  Name: Angela Gardner MRN: [id number] Date of Birth: August 15, 1960 Referring Provider (PT): Charlott Rakes   Encounter Date: 05/01/2018  PT End of Session - 05/01/18 1009    Visit Number  4    Number of Visits  4    Date for PT Re-Evaluation  05/09/18    Authorization Type  100%  (medicaid-family planning only, no auth needed    Authorization Time Period  N/A    PT Start Time  1015    PT Stop Time  1059    PT Time Calculation (min)  44 min       Past Medical History:  Diagnosis Date  . Anemia   . 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 CHF (congestive heart failure) ([place])   . Dyspnea    still having this and not moving around alot -gets worse with exertion  . History of sleep walking   . Hx of left breast biopsy   . Hyperlipemia   . Hypertension    not on any medication now-[facility] had  prescribed her a med and she stopped taking them cannot remember when    Past Surgical History:  Procedure Laterality Date  . BREAST BIOPSY     left breast  . CESAREAN SECTION     one  . CHOLECYSTECTOMY    . DILATION AND CURETTAGE OF UTERUS     approx 1 year ago  . IR IMAGING GUIDED PORT INSERTION  12/06/2017  . LYMPH NODE BIOPSY N/A 11/14/2017   Procedure: SENTINEL LYMPH NODE BIOPSY;  Surgeon: Everitt Amber, MD;  Location: [facility];  Service: Gynecology;  Laterality: N/A;  . ROBOTIC ASSISTED TOTAL HYSTERECTOMY WITH BILATERAL SALPINGO OOPHERECTOMY N/A 11/14/2017   Procedure: XI ROBOTIC ASSISTED TOTAL HYSTERECTOMY WITH BILATERAL SALPINGO OOPHORECTOMY, RIGHT PELVIC LYMPHADENECTOMY;  Surgeon: Everitt Amber, MD;  Location: [facility];  Service: Gynecology;  Laterality: N/A;    There were no vitals filed for this visit.  Subjective Assessment - 05/01/18 1017    Subjective  Pain in lower back. No knee  pain.     Currently in Pain?  Yes    Pain Score  0-No pain    Pain Location  Leg    Multiple Pain Sites  Yes    Pain Score  5    Pain Location  Back    Pain Orientation  Lower    Pain Descriptors / Indicators  Tightness    Aggravating Factors   after standing up , walking     Pain Relieving Factors  sitting                       [facility] Adult PT Treatment/Exercise - 05/01/18 0001      Exercises   Exercises  Knee/Hip      Knee/Hip Exercises: Aerobic   Nustep  L5 x 5 min      Knee/Hip Exercises: Standing   Knee Flexion  10 reps    Knee Flexion Limitations  3#      Knee/Hip Exercises: Seated   Long Arc Quad  Weights;10 reps    Long Arc Quad Weight  3 lbs.    Sit to [employer]  10 reps      Knee/Hip Exercises: Supine   Quad Sets  20 reps;Left    Short Arc [employer]  Sets  15 reps;Right;Left    Bridges  15 reps    Straight Leg Raises  Right;Left;10 reps      Knee/Hip Exercises: Sidelying   Hip ABduction  Right;Left;10 reps    Clams  clam and reverse clam x 15 RT/LT      Ankle Exercises: Stretches   Slant Board Stretch  2 reps;30 seconds               PT Short Term Goals - 05/01/18 1020      PT SHORT TERM GOAL #1   Title  Patient will demonstrate initial HEP with written instructions independently    Baseline  does some of them    Time  3    Period  Weeks    Status  On-going      PT SHORT TERM GOAL #2   Title  Patient will report ability to stand for 10 minute prior to needing to sit due to pain    Baseline  5 minutes prior to pain    Time  3    Period  Weeks    Status  On-going        PT Long Term Goals - 04/11/18 1222      PT LONG TERM GOAL #1   Title  Patient to demonstrate SLR L LE independent without pain.    Baseline  unable to perform SLR unaided and increases pain    Time  6    Period  Weeks    Status  New    Target Date  05/30/18      PT LONG TERM GOAL #2   Title  Patient will tolerate standing for 15 minutes to allow for ADL  completion prior to need to sit.    Baseline  Can stand less than 10 minutes    Time  6    Period  Weeks    Target Date  05/30/18      PT LONG TERM GOAL #3   Title  Patient will reports pain in knee improved by 50% overall    Baseline  Patient reports pain today at 5/10 increased with testing 8/10 max    Time  6    Period  Weeks    Status  New    Target Date  05/30/18      PT LONG TERM GOAL #4   Title  Patient to report no falls since starting PT    Baseline  2 falls in 6 months    Time  6    Period  Weeks    Status  New    Target Date  05/30/18            Plan - 05/01/18 1024    Clinical Impression Statement  Pt arrives and leaves AD in lobby. She has non antaligic gait and reports no pain at beginning of treatment in her knee. She did have an increase in back pain and knee pain with standing to check in after 5 minutes. She fatigues with SLR supine and sidelye but was able to complete reps with rest. Began sit-stand and she reports she is on water pills and gets up multiple times during the day to use the restroom. At end of session she reports feeling better in her legs and back. She opted to cancel next visit because she will have her last chemo treatment prior which depletes her energy for several days.    PT Next Visit Plan  assess gait/balance (TUG,?)  initiate sit<>stand, standing theraband for HEP; taping for pes anserine edema if needed.      PT Home Exercise Plan  Quad Sets, Straight leg raises, bridge, clam and reverse clam.  hip flexion,  hip abducition    Consulted and Agree with Plan of Care  Patient       Patient will benefit from skilled therapeutic intervention in order to improve the following deficits and impairments:  Abnormal gait, Pain, Postural dysfunction, Decreased mobility, Decreased activity tolerance, Decreased balance, Impaired perceived functional ability, Decreased strength, Decreased endurance, Increased edema, Impaired flexibility  Visit  Diagnosis: Chronic pain of left knee  Other abnormalities of gait and mobility     Problem List Patient Active Problem List   Diagnosis Date Noted  . 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 Depression 04/02/2018  . Prediabetes 04/02/2018  . Vitamin D deficiency 02/15/2018  . Diffuse pain 01/04/2018  . Peripheral neuropathy due to chemotherapy ([place]) 01/04/2018  . 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 Other constipation 11/28/2017  . Illicit drug use 74/82/7078  . Chronic kidney disease (CKD), stage III (moderate) ([place]) 10/30/2017  . Essential hypertension 10/27/2017  . Diastolic congestive heart failure, NYHA class 3 ([place]) 10/27/2017  . Endometrial cancer ([place]) 10/19/2017  . Morbid obesity ([place]) 10/09/2017  . Acute exacerbation of CHF (congestive heart failure) ([place]) 09/13/2017  . Hypertensive crisis 09/12/2017  . Mild renal insufficiency 09/12/2017  . Dysfunctional uterine bleeding 09/12/2017  . Acute CHF (congestive heart failure) ([place]) 09/12/2017  . Chest discomfort 09/12/2017  . PMB (postmenopausal bleeding) 06/17/2015  . 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 OTITIS MEDIA 12/04/2008  . ACUTE BRONCHITIS 12/04/2008    Dorene Ar, PTA 05/01/2018, 11:10 AM  [facility] [address] [place], [place], [address] Phone: [phone]   Fax:  [phone]  Name: Elanie Hammitt MRN: [id number] Date of Birth: 1961-03-03

## 2018-05-02 ENCOUNTER — Inpatient Hospital Stay: Payer: Self-pay

## 2018-05-02 ENCOUNTER — Inpatient Hospital Stay (HOSPITAL_BASED_OUTPATIENT_CLINIC_OR_DEPARTMENT_OTHER): Payer: Medicaid Other | Admitting: Oncology

## 2018-05-02 DIAGNOSIS — C541 Malignant neoplasm of endometrium: Secondary | ICD-10-CM

## 2018-05-02 LAB — CMP (CANCER CENTER ONLY)
ALBUMIN: 3.6 g/dL (ref 3.5–5.0)
ALT: 21 U/L (ref 0–44)
AST: 18 U/L (ref 15–41)
Alkaline Phosphatase: 93 U/L (ref 38–126)
Anion gap: 10 (ref 5–15)
BILIRUBIN TOTAL: 0.4 mg/dL (ref 0.3–1.2)
BUN: 29 mg/dL — ABNORMAL HIGH (ref 6–20)
CO2: 26 mmol/L (ref 22–32)
Calcium: 9.4 mg/dL (ref 8.9–10.3)
Chloride: 105 mmol/L (ref 98–111)
Creatinine: 1.4 mg/dL — ABNORMAL HIGH (ref 0.44–1.00)
GFR, EST NON AFRICAN AMERICAN: 41 mL/min — AB (ref >60)
GFR, Est AFR Am: 48 mL/min — ABNORMAL LOW (ref >60)
GLUCOSE: 102 mg/dL — AB (ref 70–99)
POTASSIUM: 3.7 mmol/L (ref 3.5–5.1)
Sodium: 141 mmol/L (ref 135–145)
TOTAL PROTEIN: 7.2 g/dL (ref 6.5–8.1)

## 2018-05-02 LAB — CBC WITH DIFFERENTIAL (CANCER CENTER ONLY)
Abs Immature Granulocytes: 0.05 10*3/uL (ref 0.00–0.07)
Basophils Absolute: 0 10*3/uL (ref 0.0–0.1)
Basophils Relative: 0 %
EOS ABS: 0 10*3/uL (ref 0.0–0.5)
EOS PCT: 0 %
HEMATOCRIT: 31.2 % — AB (ref 36.0–46.0)
Hemoglobin: 9.9 g/dL — ABNORMAL LOW (ref 12.0–15.0)
Immature Granulocytes: 1 %
LYMPHS ABS: 1.7 10*3/uL (ref 0.7–4.0)
Lymphocytes Relative: 35 %
MCH: 31.8 pg (ref 26.0–34.0)
MCHC: 31.7 g/dL (ref 30.0–36.0)
MCV: 100.3 fL — ABNORMAL HIGH (ref 80.0–100.0)
MONO ABS: 0.4 10*3/uL (ref 0.1–1.0)
Monocytes Relative: 8 %
Neutro Abs: 2.7 10*3/uL (ref 1.7–7.7)
Neutrophils Relative %: 56 %
Platelet Count: 155 10*3/uL (ref 150–400)
RBC: 3.11 MIL/uL — ABNORMAL LOW (ref 3.87–5.11)
RDW: 16.5 % — ABNORMAL HIGH (ref 11.5–15.5)
WBC: 4.9 10*3/uL (ref 4.0–10.5)
nRBC: 0 % (ref 0.0–0.2)

## 2018-05-02 MED ORDER — SODIUM CHLORIDE 0.9% FLUSH
10.0000 mL | Freq: Once | INTRAVENOUS | Status: AC
  Administered 2018-05-02: 10 mL
  Filled 2018-05-02: qty 10

## 2018-05-02 MED ORDER — HEPARIN SOD (PORK) LOCK FLUSH 100 UNIT/ML IV SOLN
500.0000 [IU] | Freq: Once | INTRAVENOUS | Status: AC
  Administered 2018-05-02: 500 [IU]
  Filled 2018-05-02: qty 5

## 2018-05-03 ENCOUNTER — Inpatient Hospital Stay: Payer: Self-pay

## 2018-05-03 ENCOUNTER — Other Ambulatory Visit: Payer: Self-pay | Admitting: Oncology

## 2018-05-03 ENCOUNTER — Ambulatory Visit: Payer: Self-pay

## 2018-05-03 VITALS — BP 158/89 | HR 77 | Temp 98.4°F | Resp 14

## 2018-05-03 DIAGNOSIS — C541 Malignant neoplasm of endometrium: Secondary | ICD-10-CM

## 2018-05-03 MED ORDER — HEPARIN SOD (PORK) LOCK FLUSH 100 UNIT/ML IV SOLN
500.0000 [IU] | Freq: Once | INTRAVENOUS | Status: AC | PRN
  Administered 2018-05-03: 500 [IU]
  Filled 2018-05-03: qty 5

## 2018-05-03 MED ORDER — DIPHENHYDRAMINE HCL 50 MG/ML IJ SOLN
50.0000 mg | Freq: Once | INTRAMUSCULAR | Status: AC
  Administered 2018-05-03: 50 mg via INTRAVENOUS

## 2018-05-03 MED ORDER — PALONOSETRON HCL INJECTION 0.25 MG/5ML
0.2500 mg | Freq: Once | INTRAVENOUS | Status: AC
  Administered 2018-05-03: 0.25 mg via INTRAVENOUS

## 2018-05-03 MED ORDER — SODIUM CHLORIDE 0.9 % IV SOLN
Freq: Once | INTRAVENOUS | Status: AC
  Administered 2018-05-03: 10:00:00 via INTRAVENOUS
  Filled 2018-05-03: qty 5

## 2018-05-03 MED ORDER — SODIUM CHLORIDE 0.9% FLUSH
10.0000 mL | INTRAVENOUS | Status: DC | PRN
  Administered 2018-05-03: 10 mL
  Filled 2018-05-03: qty 10

## 2018-05-03 MED ORDER — SODIUM CHLORIDE 0.9 % IV SOLN
500.0000 mg | Freq: Once | INTRAVENOUS | Status: AC
  Administered 2018-05-03: 500 mg via INTRAVENOUS
  Filled 2018-05-03: qty 50

## 2018-05-03 MED ORDER — PALONOSETRON HCL INJECTION 0.25 MG/5ML
INTRAVENOUS | Status: AC
  Filled 2018-05-03: qty 5

## 2018-05-03 MED ORDER — DIPHENHYDRAMINE HCL 50 MG/ML IJ SOLN
INTRAMUSCULAR | Status: AC
  Filled 2018-05-03: qty 1

## 2018-05-03 MED ORDER — SODIUM CHLORIDE 0.9 % IV SOLN
Freq: Once | INTRAVENOUS | Status: DC
  Filled 2018-05-03: qty 250

## 2018-05-03 MED ORDER — FAMOTIDINE IN NACL 20-0.9 MG/50ML-% IV SOLN
20.0000 mg | Freq: Once | INTRAVENOUS | Status: AC
  Administered 2018-05-03: 20 mg via INTRAVENOUS

## 2018-05-03 MED ORDER — SODIUM CHLORIDE 0.9 % IV SOLN
116.6667 mg/m2 | Freq: Once | INTRAVENOUS | Status: AC
  Administered 2018-05-03: 264 mg via INTRAVENOUS
  Filled 2018-05-03: qty 44

## 2018-05-03 MED ORDER — FAMOTIDINE IN NACL 20-0.9 MG/50ML-% IV SOLN
INTRAVENOUS | Status: AC
  Filled 2018-05-03: qty 50

## 2018-05-03 NOTE — Patient Instructions (Signed)
[place] Discharge Instructions for Patients Receiving Chemotherapy  Today you received the following chemotherapy agents: Taxol & Carboplatin.  To help prevent nausea and vomiting after your treatment, we encourage you to take your nausea medication as prescribed.  Do not take zofran for three days following your infusion, take compazine instead.   If you develop nausea and vomiting that is not controlled by your nausea medication, call the clinic.   BELOW ARE SYMPTOMS THAT SHOULD BE REPORTED IMMEDIATELY:  *FEVER GREATER THAN 100.5 F  *CHILLS WITH OR WITHOUT FEVER  NAUSEA AND VOMITING THAT IS NOT CONTROLLED WITH YOUR NAUSEA MEDICATION  *UNUSUAL SHORTNESS OF BREATH  *UNUSUAL BRUISING OR BLEEDING  TENDERNESS IN MOUTH AND THROAT WITH OR WITHOUT PRESENCE OF ULCERS  *URINARY PROBLEMS  *BOWEL PROBLEMS  UNUSUAL RASH Items with * indicate a potential emergency and should be followed up as soon as possible.  Feel free to call the clinic should you have any questions or concerns. The clinic phone number is [phone].  Please show the [facility] at check-in to the Emergency Department and triage nurse.

## 2018-05-07 ENCOUNTER — Ambulatory Visit: Payer: Self-pay

## 2018-05-09 ENCOUNTER — Encounter: Payer: Self-pay | Admitting: Physical Therapy

## 2018-05-09 ENCOUNTER — Ambulatory Visit: Payer: Self-pay | Admitting: Physical Therapy

## 2018-05-09 VITALS — HR 108

## 2018-05-09 DIAGNOSIS — R2689 Other abnormalities of gait and mobility: Secondary | ICD-10-CM

## 2018-05-09 DIAGNOSIS — M25562 Pain in left knee: Principal | ICD-10-CM

## 2018-05-09 DIAGNOSIS — G8929 Other chronic pain: Secondary | ICD-10-CM

## 2018-05-09 NOTE — Therapy (Signed)
[place], [place], [address] Phone: [phone]   Fax:  [phone]  Physical Therapy Treatment  Patient Details  Name: Angela Gardner MRN: [id number] Date of Birth: March 26, 1961 Referring Provider (PT): Charlott Rakes   Encounter Date: 05/09/2018  PT End of Session - 05/09/18 0934    Visit Number  5    Date for PT Re-Evaluation  05/09/18    Authorization Type  100% CAFA  (medicaid-family planning only, no auth needed    PT Start Time  0930    PT Stop Time  1014    PT Time Calculation (min)  44 min       Past Medical History:  Diagnosis Date  . Anemia   . 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 CHF (congestive heart failure) ([place])   . Dyspnea    still having this and not moving around alot -gets worse with exertion  . History of sleep walking   . Hx of left breast biopsy   . Hyperlipemia   . Hypertension    not on any medication now-[facility] had  prescribed her a med and she stopped taking them cannot remember when    Past Surgical History:  Procedure Laterality Date  . BREAST BIOPSY     left breast  . CESAREAN SECTION     one  . CHOLECYSTECTOMY    . DILATION AND CURETTAGE OF UTERUS     approx 1 year ago  . IR IMAGING GUIDED PORT INSERTION  12/06/2017  . LYMPH NODE BIOPSY N/A 11/14/2017   Procedure: SENTINEL LYMPH NODE BIOPSY;  Surgeon: Everitt Amber, MD;  Location: [facility];  Service: Gynecology;  Laterality: N/A;  . ROBOTIC ASSISTED TOTAL HYSTERECTOMY WITH BILATERAL SALPINGO OOPHERECTOMY N/A 11/14/2017   Procedure: XI ROBOTIC ASSISTED TOTAL HYSTERECTOMY WITH BILATERAL SALPINGO OOPHORECTOMY, RIGHT PELVIC LYMPHADENECTOMY;  Surgeon: Everitt Amber, MD;  Location: [facility];  Service: Gynecology;  Laterality: N/A;    Vitals:   05/09/18 0933  Pulse: (!) 108  SpO2: 97%    Subjective Assessment - 05/09/18 0933    Subjective  Still a little stiffness from chemo last thursday    Currently in  Pain?  Yes    Pain Score  5     Pain Location  Knee    Pain Orientation  Left    Pain Descriptors / Indicators  Aching    Pain Type  Chronic pain    Aggravating Factors   standing, walking     Pain Relieving Factors  sit, lye, rest                        [facility] Adult PT Treatment/Exercise - 05/09/18 0001      Exercises   Exercises  Knee/Hip      Knee/Hip Exercises: Aerobic   Tread Mill  .7 to .5 mph x 1 min 30 sec prior to fatigue cues for posture and step length    Nustep  L5 x 5 min      Knee/Hip Exercises: Seated   Long Arc [employer] reps    Long Arc Quad Weight  3 lbs.    Marching  10 reps;2 sets    Hamstring Curl  20 reps    Hamstring Limitations  green      Knee/Hip Exercises: Supine   Short Arc Quad Sets  15 reps;Right;Left    Short Arc Quad Sets Limitations  3#  Bridges  15 reps    Straight Leg Raises  Right;Left;10 reps    Other Supine Knee/Hip Exercises  ball squeeze x 10     Other Supine Knee/Hip Exercises  clam with green band       Knee/Hip Exercises: Sidelying   Clams  clam and reverse clam x 15 RT/LT      Ankle Exercises: Stretches   Gastroc Stretch  2 reps;20 seconds   runners               PT Short Term Goals - 05/01/18 1020      PT SHORT TERM GOAL #1   Title  Patient will demonstrate initial HEP with written instructions independently    Baseline  does some of them    Time  3    Period  Weeks    Status  On-going      PT SHORT TERM GOAL #2   Title  Patient will report ability to stand for 10 minute prior to needing to sit due to pain    Baseline  5 minutes prior to pain    Time  3    Period  Weeks    Status  On-going        PT Long Term Goals - 04/11/18 1222      PT LONG TERM GOAL #1   Title  Patient to demonstrate SLR L LE independent without pain.    Baseline  unable to perform SLR unaided and increases pain    Time  6    Period  Weeks    Status  New    Target Date  05/30/18      PT LONG TERM  GOAL #2   Title  Patient will tolerate standing for 15 minutes to allow for ADL completion prior to need to sit.    Baseline  Can stand less than 10 minutes    Time  6    Period  Weeks    Target Date  05/30/18      PT LONG TERM GOAL #3   Title  Patient will reports pain in knee improved by 50% overall    Baseline  Patient reports pain today at 5/10 increased with testing 8/10 max    Time  6    Period  Weeks    Status  New    Target Date  05/30/18      PT LONG TERM GOAL #4   Title  Patient to report no falls since starting PT    Baseline  2 falls in 6 months    Time  6    Period  Weeks    Status  New    Target Date  05/30/18            Plan - 05/09/18 0935    Clinical Impression Statement  Pt reports back and knee are feeling better however activity on feet increases back and knee pain. She is trying to walk a little more. Trial of Treadmill today with pt needing to stop after 90 seconds due to fatigue. Continued with gentle hip and knee strengthening as tolerated today.     PT Next Visit Plan  assess gait/balance (TUG,?)  initiate sit<>stand, standing theraband for HEP; taping for pes anserine edema if needed.  No modalities due to history of CA     PT Home Exercise Plan  Quad Sets, Straight leg raises, bridge, clam and reverse clam.  hip flexion,  hip abducition  Consulted and Agree with Plan of Care  Patient       Patient will benefit from skilled therapeutic intervention in order to improve the following deficits and impairments:  Abnormal gait, Pain, Postural dysfunction, Decreased mobility, Decreased activity tolerance, Decreased balance, Impaired perceived functional ability, Decreased strength, Decreased endurance, Increased edema, Impaired flexibility  Visit Diagnosis: Chronic pain of left knee  Other abnormalities of gait and mobility     Problem List Patient Active Problem List   Diagnosis Date Noted  . Antineoplastic chemotherapy induced anemia  04/05/2018  . Depression 04/02/2018  . Prediabetes 04/02/2018  . Vitamin D deficiency 02/15/2018  . Diffuse pain 01/04/2018  . 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 Other constipation 11/28/2017  . Illicit drug use 77/08/4033  . Chronic kidney disease (CKD), stage III (moderate) ([place]) 10/30/2017  . Essential hypertension 10/27/2017  . Diastolic congestive heart failure, NYHA class 3 ([place]) 10/27/2017  . Endometrial cancer ([place]) 10/19/2017  . Morbid obesity ([place]) 10/09/2017  . Acute exacerbation of CHF (congestive heart failure) ([place]) 09/13/2017  . Hypertensive crisis 09/12/2017  . Mild renal insufficiency 09/12/2017  . Dysfunctional uterine bleeding 09/12/2017  . Acute CHF (congestive heart failure) ([place]) 09/12/2017  . Chest discomfort 09/12/2017  . PMB (postmenopausal bleeding) 06/17/2015  . 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 OTITIS MEDIA 12/04/2008  . ACUTE BRONCHITIS 12/04/2008    Dorene Ar, PTA 05/09/2018, 10:10 AM  [facility] [address] [place], [place], [address] Phone: [phone]   Fax:  [phone]  Name: Angela Gardner MRN: [id number] Date of Birth: [birth year]

## 2018-05-10 MED FILL — METOPROLOL TARTRATE 25 MG T: 25 | 30 days supply | Qty: 60 | Fill #1

## 2018-05-10 MED FILL — AMLODIPINE BESYLATE 10 MG T: 10 | 30 days supply | Qty: 30 | Fill #0

## 2018-05-10 MED FILL — hydrALAZINE HCL 25 MG TABS: 25 | 30 days supply | Qty: 90 | Fill #1

## 2018-05-10 MED FILL — FUROSEMIDE 40 MG TAB: 40 | 45 days supply | Qty: 45 | Fill #1

## 2018-05-14 ENCOUNTER — Ambulatory Visit: Payer: Self-pay

## 2018-05-14 DIAGNOSIS — G8929 Other chronic pain: Secondary | ICD-10-CM

## 2018-05-14 DIAGNOSIS — R2689 Other abnormalities of gait and mobility: Secondary | ICD-10-CM

## 2018-05-14 DIAGNOSIS — M25562 Pain in left knee: Principal | ICD-10-CM

## 2018-05-14 NOTE — Therapy (Signed)
[place], [place], [address] Phone: [phone]   Fax:  [phone]  Physical Therapy Treatment/Discharge  Patient Details  Name: Angela Gardner MRN: [id number] Date of Birth: [birth year] Referring Provider (PT): Charlott Rakes   Encounter Date: 05/14/2018  PT End of Session - 05/14/18 0932    Visit Number  6    Authorization Type  100% CAFA  (medicaid-family planning only, no auth needed    PT Start Time  0930    PT Stop Time  0954    PT Time Calculation (min)  24 min    Activity Tolerance  Patient tolerated treatment well    Behavior During Therapy  [facility] for tasks assessed/performed       Past Medical History:  Diagnosis Date  . Anemia   . 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 CHF (congestive heart failure) ([place])   . Dyspnea    still having this and not moving around alot -gets worse with exertion  . History of sleep walking   . Hx of left breast biopsy   . Hyperlipemia   . Hypertension    not on any medication now-[facility] had  prescribed her a med and she stopped taking them cannot remember when    Past Surgical History:  Procedure Laterality Date  . BREAST BIOPSY     left breast  . CESAREAN SECTION     one  . CHOLECYSTECTOMY    . DILATION AND CURETTAGE OF UTERUS     approx 1 year ago  . IR IMAGING GUIDED PORT INSERTION  12/06/2017  . LYMPH NODE BIOPSY N/A 11/14/2017   Procedure: SENTINEL LYMPH NODE BIOPSY;  Surgeon: Everitt Amber, MD;  Location: [facility];  Service: Gynecology;  Laterality: N/A;  . ROBOTIC ASSISTED TOTAL HYSTERECTOMY WITH BILATERAL SALPINGO OOPHERECTOMY N/A 11/14/2017   Procedure: XI ROBOTIC ASSISTED TOTAL HYSTERECTOMY WITH BILATERAL SALPINGO OOPHORECTOMY, RIGHT PELVIC LYMPHADENECTOMY;  Surgeon: Everitt Amber, MD;  Location: [facility];  Service: Gynecology;  Laterality: N/A;    There were no vitals filed for this visit.  Subjective Assessment - 05/14/18 0935     Subjective  Walking without cane.   She does use cane when goes out but less lately.  she feel HE{P is easing pain.   She reports back pain from kidneys.        Currently in Pain?  No/denies         [facility] PT Assessment - 05/14/18 0001      Assessment   Medical Diagnosis  L knee pain    Referring Provider (PT)  [facility] Equipment  Crutches      Prior Function   Level of Independence  --   She is needeing less assit with ADL's and wlaking     Observation/Other Assessments   Observations  Ambulalting with no device 50% or more of day and working on using less.       AROM   Left Knee Extension  3    Left Knee Flexion  117      Strength   Left Hip Flexion  3/5    Left Knee Flexion  4/5    Left Knee Extension  4+/5    Left Ankle Dorsiflexion  4/5      Ambulation/Gait   Ambulation/Gait  Yes    Ambulation/Gait Assistance  7: Independent    Ambulation/Gait Assistance Details  pacing [facility]  Assistive device  None    Gait Pattern  Step-through pattern    Ambulation Surface  Level;Indoor                   [facility] Adult PT Treatment/Exercise - 05/14/18 0001      Self-Care   Self-Care  Other Self-Care Comments    Other Self-Care Comments   reviewed progression of HEP to max strength benefits form HEP. Cautioned  to use cane as needed if knee is painful.              PT Education - 05/14/18 0951    Education Details  Progression of HEP with incr reps every 2 weeks or more with goal of 30 reps without pain and to stop incr if she feels she cannot do that many reps.     Person(s) Educated  Patient    Methods  Explanation    Comprehension  Verbalized understanding       PT Short Term Goals - 05/14/18 0956      PT SHORT TERM GOAL #1   Title  Patient will demonstrate initial HEP with written instructions independently    Baseline  She reports doing all HEP     Status  Achieved      PT SHORT TERM GOAL #2   Title  Patient  will report ability to stand for 10 minute prior to needing to sit due to pain    Baseline  around 10 min    Status  Partially Met        PT Long Term Goals - 05/14/18 0957      PT LONG TERM GOAL #1   Title  Patient to demonstrate SLR L LE independent without pain.    Baseline  still uncomfortable but able to do    Status  Partially Met      PT LONG TERM GOAL #2   Title  Patient will tolerate standing for 15 minutes to allow for ADL completion prior to need to sit.    Baseline  around 10 min    Status  Partially Met      PT LONG TERM GOAL #3   Title  Patient will reports pain in knee improved by 50% overall    Status  Achieved      PT LONG TERM GOAL #4   Title  Patient to report no falls since starting PT    Status  Achieved            Plan - 05/14/18 0933    Clinical Impression Statement  She reports much improvement withPT with increased tolearance with mobility and decr pain. Her sister has been a good influence keeping her doing her HEP. She is pleased with progress and does not want more exercises and feels what she is doing is helping.  She feels no need for more PT    PT Treatment/Interventions  Functional mobility training;Gait training;DME Instruction;ADLs/Self Care Home Management;Patient/family education;Therapeutic activities;Therapeutic exercise;Balance training;Taping;Manual techniques;Stair training;Electrical Stimulation;Cryotherapy;Moist Heat    PT Next Visit Plan  Discharge with HEP    PT Home Exercise Plan  Quad Sets, Straight leg raises, bridge, clam and reverse clam.  hip flexion,  hip abducition    Consulted and Agree with Plan of Care  Patient       Patient will benefit from skilled therapeutic intervention in order to improve the following deficits and impairments:  Abnormal gait, Pain, Postural dysfunction, Decreased mobility, Decreased activity tolerance, Decreased balance, Impaired  perceived functional ability, Decreased strength, Decreased  endurance, Increased edema, Impaired flexibility  Visit Diagnosis: Chronic pain of left knee  Other abnormalities of gait and mobility     Problem List Patient Active Problem List   Diagnosis Date Noted  . 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 Depression 04/02/2018  . Prediabetes 04/02/2018  . Vitamin D deficiency 02/15/2018  . Diffuse pain 01/04/2018  . Peripheral neuropathy due to chemotherapy ([place]) 01/04/2018  . 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 Other constipation 11/28/2017  . Illicit drug use 61/44/3154  . Chronic kidney disease (CKD), stage III (moderate) ([place]) 10/30/2017  . Essential hypertension 10/27/2017  . Diastolic congestive heart failure, NYHA class 3 ([place]) 10/27/2017  . Endometrial cancer ([place]) 10/19/2017  . Morbid obesity ([place]) 10/09/2017  . Acute exacerbation of CHF (congestive heart failure) ([place]) 09/13/2017  . Hypertensive crisis 09/12/2017  . Mild renal insufficiency 09/12/2017  . Dysfunctional uterine bleeding 09/12/2017  . Acute CHF (congestive heart failure) ([place]) 09/12/2017  . Chest discomfort 09/12/2017  . PMB (postmenopausal bleeding) 06/17/2015  . 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 OTITIS MEDIA 12/04/2008  . ACUTE BRONCHITIS 12/04/2008    Darrel Hoover  PT 05/14/2018, 9:59 AM  [facility] [address] [place], [place], [address] Phone: [phone]   Fax:  [phone]  Name: Angela Gardner MRN: [id number] Date of Birth: 09-13-1960  PHYSICAL THERAPY DISCHARGE SUMMARY  Visits from Start of Care: 6  Current functional level related to goals / functional outcomes: See above   Remaining deficits: Limited mobility time but improving   Education / Equipment: HEP Plan: Patient agrees to discharge.  Patient goals were partially met. Patient is being discharged due to being pleased with the current functional level.  ?????

## 2018-05-16 ENCOUNTER — Ambulatory Visit: Payer: Self-pay | Admitting: Physical Therapy

## 2018-05-25 ENCOUNTER — Inpatient Hospital Stay: Payer: Medicaid Other | Attending: Hematology and Oncology | Admitting: Gynecologic Oncology

## 2018-05-25 ENCOUNTER — Encounter: Payer: Self-pay | Admitting: Gynecologic Oncology

## 2018-05-25 VITALS — BP 150/88 | HR 82 | Temp 98.0°F | Resp 20 | Ht 64.0 in | Wt 269.0 lb

## 2018-05-25 DIAGNOSIS — I83892 Varicose veins of left lower extremities with other complications: Secondary | ICD-10-CM | POA: Insufficient documentation

## 2018-05-25 DIAGNOSIS — Z9221 Personal history of antineoplastic chemotherapy: Secondary | ICD-10-CM

## 2018-05-25 DIAGNOSIS — C541 Malignant neoplasm of endometrium: Secondary | ICD-10-CM

## 2018-05-25 DIAGNOSIS — Z90710 Acquired absence of both cervix and uterus: Secondary | ICD-10-CM

## 2018-05-25 DIAGNOSIS — Z923 Personal history of irradiation: Secondary | ICD-10-CM | POA: Insufficient documentation

## 2018-05-25 DIAGNOSIS — Z90722 Acquired absence of ovaries, bilateral: Secondary | ICD-10-CM

## 2018-05-25 DIAGNOSIS — Z87891 Personal history of nicotine dependence: Secondary | ICD-10-CM

## 2018-05-25 HISTORY — DX: Varicose veins of left lower extremity with other complications: I83.892

## 2018-05-25 NOTE — Patient Instructions (Signed)
Dr Denman George has scheduled you for an ultrasound to rule out blood clot in the left leg.  Please follow-up with Dr Sondra Come in March, and Dr Denman George in June, 2020.  Please notify Dr Denman George at phone number [phone] if you notice vaginal bleeding, new pelvic or abdominal pains, bloating, feeling full easy, or a change in bladder or bowel function.

## 2018-05-25 NOTE — Progress Notes (Signed)
Follow-up Note: Gyn-Onc  Consult was requested by Dr. Hulan Fray for the evaluation of Angela Gardner 57 y.o. female  CC:  Chief Complaint  Patient presents with  . Endometrial cancer [facility])    Assessment/Plan:  [name]  is a 57 y.o.  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 High risk factors for recurrence given the unfavorable cell type. Recommendation was for 6 cycles of carboplatin and paclitaxel chemotherapy and vaginal brachytherapy to reduce risk for local recurrence in accordance with NCCN guidelines. S/p adjuvant therapy with 6cycles of carb/taxol and vaginal brachytherapy completed November, 2019.  LLE edema - rule out VTE with dopplers.   HPI: Angela Gardner is a 57 year old P1 who is seen in consultation at the request of Dr Hulan Fray for grade 3 endometrial cancer.   The patient transitioned through menopause prior to age 38.  She had an episode of postmenopausal bleeding in approximately 2017 at which time she had a D&C procedure revealing benign pathology.  She stopped vaginal bleeding until approximately November 2018 when she began having heavy menstrual-like bleeding and was started on Megace.  This helped some however bleeding persisted and therefore she underwent a transvaginal ultrasound scan on 09/15/2017 this revealed a uterus measuring 11.2 x 5 x 6.9 cm with a subserosal fundal fibroid measuring up to 3.8 cm.  The endometrial thickness was 7 mm with concern for an echogenic mass centrally within the endometrium measuring 3.8 x 3.8 x 3.6 cm.  She then underwent an endometrial Pipelle biopsy on 10/09/2017 which revealed high-grade adenocarcinoma.  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  Due to the finding of a high-grade endometrial cancer that she underwent a CT scan of the chest abdomen and pelvis on September 12, 2017 this revealed grossly unremarkable uterus  and ovaries, no lymphadenopathy, no gross evidence of extrauterine disease or metastases.  The patient otherwise is obese with hypertension and hypercholesterolemia.  She has had one prior pregnancy which resulted in a cesarean delivery.  She has had an open cholecystectomy.  On 11/14/17 she underwent robotically assisted total hysterectomy, BSO, SLN biopsy. Postoperatively her recovery was complicated by dizziness, dehydration and azotemia requiring an additional night's stay in the hospital. This resolved with hydration. Preop urine tox screen was positive for cocaine. .  Final pathology revealed a 2.3cm high grade serous endometrial cancer with sarcomatoid features. The carcinoma invades 0.5cm of 3cm in myometrium. Lymph nodes (left sentinel, right pelvic) were negative. No LVSI was present. No cervical or adnexal involvement. She was determined to have stage IA high grade serous carcinoma of the uterus and was recommended to receive adjuvant therapy in the form of 6 cycles of carboplatin and paclitaxel and vaginal brachytherapy in accordance with NCCN guidelines.   Interval Hx:  On 12/21/17 the patient began experiencing leakage of clear fluid from the vagina. She was seen in the [place] ER on 12/22/17 and a CT urogram with retrograde cystogram was performed. It showed no extravasation of contrast around the kidneys, from the ureters or around the vagina. There was a 2cm collection of fluid adjacent to the vagina. No definitive fistula/leak was identified. The leakage of fluid spontaneously resolved.   She went on to complete 6 cycles of carboplatin paclitaxel chemotherapy with Dr. go such between June and November 2019.  She also completed 30 Gy of vaginal brachii therapy with Dr. Sondra Come in 5  fractions completed in November 2019.  Since completing therapy she has had no issues other than intermittent left lower extremity edema.  Current Meds:  Outpatient Encounter Medications as of 05/25/2018   Medication Sig  . acetaminophen (TYLENOL) 500 MG tablet Take 1,000 mg by mouth every 6 (six) hours as needed.  Marland Kitchen amLODipine (NORVASC) 10 MG tablet Take 1 tablet (10 mg total) by mouth daily.  . furosemide (LASIX) 40 MG tablet Take 1 tablet (40 mg total) by mouth 2 (two) times daily. For 1 week then resume 1 tablet daily  . hydrALAZINE (APRESOLINE) 25 MG tablet Take 1 tablet (25 mg total) by mouth 3 (three) times daily.  Marland Kitchen ibuprofen (ADVIL,MOTRIN) 200 MG tablet Take 400 mg by mouth every 6 (six) hours as needed.  . metoprolol tartrate (LOPRESSOR) 25 MG tablet Take 1 tablet (25 mg total) by mouth 2 (two) times daily.  . polyethylene glycol (MIRALAX / GLYCOLAX) packet Take 17 g by mouth daily as needed for mild constipation.   . Vitamin D, Ergocalciferol, (DRISDOL) 50000 units CAPS capsule Take 1 capsule (50,000 Units total) by mouth every 7 (seven) days.  Marland Kitchen dexamethasone (DECADRON) 4 MG tablet Take 5 tabs at the night before and 5 tab the morning of chemotherapy, every 3 weeks, by mouth (Patient not taking: Reported on 05/25/2018)  . lidocaine-prilocaine (EMLA) cream Apply to affected area once (Patient not taking: Reported on 05/25/2018)  . magic mouthwash w/lidocaine SOLN Take 5 mLs by mouth 4 (four) times daily as needed for mouth pain. Swish and spit (Patient not taking: Reported on 05/25/2018)  . ondansetron (ZOFRAN) 8 MG tablet Take 1 tablet (8 mg total) by mouth every 8 (eight) hours as needed for refractory nausea / vomiting. Start on day 3 after chemo. (Patient not taking: Reported on 05/25/2018)  . prochlorperazine (COMPAZINE) 10 MG tablet Take 1 tablet (10 mg total) by mouth every 6 (six) hours as needed (Nausea or vomiting). (Patient not taking: Reported on 05/25/2018)  . senna (SENOKOT) 8.6 MG TABS tablet Take 1 tablet by mouth daily.   No facility-administered encounter medications on file as of 05/25/2018.     Allergy:  Allergies  Allergen Reactions  . Vicodin  [Hydrocodone-Acetaminophen] Itching    Social Hx:   Social History   Socioeconomic History  . Marital status: Single    Spouse name: Not on file  . Number of children: 1  . Years of education: Not on file  . Highest education level: Not on file  Occupational History  . Not on file  Social Needs  . Financial resource strain: Not on file  . Food insecurity:    Worry: Not on file    Inability: Not on file  . Transportation needs:    Medical: Not on file    Non-medical: Not on file  Tobacco Use  . Smoking status: Former Smoker    Packs/day: 0.30    Years: 26.00    Pack years: 7.80    Types: Cigarettes  . Smokeless tobacco: Never Used  . Tobacco comment: quit May 2019  Substance and Sexual Activity  . Alcohol use: Yes    Comment: social drinking  . Drug use: Yes    Types: Cocaine    Comment: last time was one  to two weeks ago per patient   . Sexual activity: Not Currently    Birth control/protection: Post-menopausal  Lifestyle  . Physical activity:    Days per week: Not on file    Minutes  per session: Not on file  . Stress: Not on file  Relationships  . Social connections:    Talks on phone: Not on file    Gets together: Not on file    Attends religious service: Not on file    Active member of club or organization: Not on file    Attends meetings of clubs or organizations: Not on file    Relationship status: Not on file  . Intimate partner violence:    Fear of current or ex partner: Not on file    Emotionally abused: Not on file    Physically abused: Not on file    Forced sexual activity: Not on file  Other Topics Concern  . Not on file  Social History Narrative  . Not on file    Past Surgical Hx:  Past Surgical History:  Procedure Laterality Date  . BREAST BIOPSY     left breast  . CESAREAN SECTION     one  . CHOLECYSTECTOMY    . DILATION AND CURETTAGE OF UTERUS     approx 1 year ago  . IR IMAGING GUIDED PORT INSERTION  12/06/2017  . LYMPH NODE  BIOPSY N/A 11/14/2017   Procedure: SENTINEL LYMPH NODE BIOPSY;  Surgeon: Everitt Amber, MD;  Location: [facility];  Service: Gynecology;  Laterality: N/A;  . ROBOTIC ASSISTED TOTAL HYSTERECTOMY WITH BILATERAL SALPINGO OOPHERECTOMY N/A 11/14/2017   Procedure: XI ROBOTIC ASSISTED TOTAL HYSTERECTOMY WITH BILATERAL SALPINGO OOPHORECTOMY, RIGHT PELVIC LYMPHADENECTOMY;  Surgeon: Everitt Amber, MD;  Location: [facility];  Service: Gynecology;  Laterality: N/A;    Past Medical Hx:  Past Medical History:  Diagnosis Date  . Anemia   . 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 CHF (congestive heart failure) ([place])   . Dyspnea    still having this and not moving around alot -gets worse with exertion  . History of sleep walking   . Hx of left breast biopsy   . Hyperlipemia   . Hypertension    not on any medication now-[facility] had  prescribed her a med and she stopped taking them cannot remember when    Past Gynecological History:  C/s x 1, positive high risk HPV in 2019. No LMP recorded (lmp unknown). Patient has had a hysterectomy.  Family Hx:  Family History  Problem Relation Age of Onset  . Anuerysm Mother   . Congestive Heart Failure Mother   . Hypertension Father   . Hypertension Sister   . Hypertension Brother     Review of Systems:  Constitutional  Feels well,    ENT Normal appearing ears and nares bilaterally Skin/Breast  No rash, sores, jaundice, itching, dryness Cardiovascular  No chest pain, shortness of breath, or edema  Pulmonary  No cough or wheeze.  Gastro Intestinal  No nausea, vomitting, or diarrhoea. No bright red blood per rectum, no abdominal pain, change in bowel movement, or constipation.  Genito Urinary  No frequency, urgency, dysuria, no leakage of blood or fluid.    Musculo Skeletal  No myalgia, arthralgia, joint swelling or pain  Neurologic  No weakness, numbness, change in gait,  Psychology  No depression, anxiety, insomnia.   Vitals:  Blood pressure (!)  150/88, pulse 82, temperature 98 F (36.7 C), temperature source Oral, resp. rate 20, height 5\' 4"  (1.626 m), weight 269 lb (122 kg), SpO2 100 %.  Physical Exam: WD in NAD Neck  Supple NROM, without any enlargements.  Lymph Node Survey No cervical supraclavicular or  inguinal adenopathy Cardiovascular  Pulse normal rate, regularity and rhythm. S1 and S2 normal.  Lungs  Clear to auscultation bilateraly, without wheezes/crackles/rhonchi. Good air movement.  Skin  No rash/lesions/breakdown  Psychiatry  Alert and oriented to person, place, and time  Abdomen  Normoactive bowel sounds, abdomen soft, non-tender and obese without evidence of hernia. Incisions are well healed and soft.  Back No CVA tenderness Genito Urinary  Well healed vaginal cuff, no lesions, no masses, no blood, no drainage. No pelvic masses.  Rectal  deferred Extremities  + subtle left lower extremity edema   Thereasa Solo, MD  05/25/2018, 12:22 PM

## 2018-05-28 ENCOUNTER — Ambulatory Visit (HOSPITAL_COMMUNITY)
Admission: RE | Admit: 2018-05-28 | Discharge: 2018-05-28 | Disposition: A | Discharge status: Home / Self Care | Payer: Self-pay | Source: Ambulatory Visit | Attending: Hematology and Oncology | Admitting: Hematology and Oncology

## 2018-05-28 ENCOUNTER — Inpatient Hospital Stay: Payer: Medicaid Other

## 2018-05-28 ENCOUNTER — Other Ambulatory Visit: Payer: Medicaid Other

## 2018-05-28 ENCOUNTER — Ambulatory Visit (HOSPITAL_BASED_OUTPATIENT_CLINIC_OR_DEPARTMENT_OTHER)
Admission: RE | Admit: 2018-05-28 | Discharge: 2018-05-28 | Disposition: A | Discharge status: Home / Self Care | Payer: Self-pay | Source: Ambulatory Visit | Attending: Gynecologic Oncology | Admitting: Gynecologic Oncology

## 2018-05-28 DIAGNOSIS — C541 Malignant neoplasm of endometrium: Secondary | ICD-10-CM

## 2018-05-28 DIAGNOSIS — E559 Vitamin D deficiency, unspecified: Secondary | ICD-10-CM

## 2018-05-28 DIAGNOSIS — C55 Malignant neoplasm of uterus, part unspecified: Secondary | ICD-10-CM | POA: Insufficient documentation

## 2018-05-28 LAB — CBC WITH DIFFERENTIAL (CANCER CENTER ONLY)
Abs Immature Granulocytes: 0.02 10*3/uL (ref 0.00–0.07)
BASOS ABS: 0 10*3/uL (ref 0.0–0.1)
Basophils Relative: 0 %
Eosinophils Absolute: 0 10*3/uL (ref 0.0–0.5)
Eosinophils Relative: 0 %
HEMATOCRIT: 29.6 % — AB (ref 36.0–46.0)
HEMOGLOBIN: 9.6 g/dL — AB (ref 12.0–15.0)
Immature Granulocytes: 1 %
LYMPHS ABS: 1.5 10*3/uL (ref 0.7–4.0)
LYMPHS PCT: 42 %
MCH: 32.2 pg (ref 26.0–34.0)
MCHC: 32.4 g/dL (ref 30.0–36.0)
MCV: 99.3 fL (ref 80.0–100.0)
Monocytes Absolute: 0.3 10*3/uL (ref 0.1–1.0)
Monocytes Relative: 9 %
NEUTROS ABS: 1.7 10*3/uL (ref 1.7–7.7)
NRBC: 0 % (ref 0.0–0.2)
Neutrophils Relative %: 48 %
Platelet Count: 152 10*3/uL (ref 150–400)
RBC: 2.98 MIL/uL — AB (ref 3.87–5.11)
RDW: 15.1 % (ref 11.5–15.5)
WBC: 3.5 10*3/uL — AB (ref 4.0–10.5)

## 2018-05-28 LAB — CMP (CANCER CENTER ONLY)
ALT: 12 U/L (ref 0–44)
ANION GAP: 11 (ref 5–15)
AST: 14 U/L — ABNORMAL LOW (ref 15–41)
Albumin: 3.7 g/dL (ref 3.5–5.0)
Alkaline Phosphatase: 97 U/L (ref 38–126)
BUN: 23 mg/dL — ABNORMAL HIGH (ref 6–20)
CHLORIDE: 105 mmol/L (ref 98–111)
CO2: 24 mmol/L (ref 22–32)
Calcium: 9.4 mg/dL (ref 8.9–10.3)
Creatinine: 1.14 mg/dL — ABNORMAL HIGH (ref 0.44–1.00)
GFR, EST NON AFRICAN AMERICAN: 54 mL/min — AB (ref >60)
Glucose, Bld: 99 mg/dL (ref 70–99)
Potassium: 3.7 mmol/L (ref 3.5–5.1)
SODIUM: 140 mmol/L (ref 135–145)
Total Bilirubin: 0.2 mg/dL — ABNORMAL LOW (ref 0.3–1.2)
Total Protein: 7.1 g/dL (ref 6.5–8.1)

## 2018-05-28 MED ORDER — SODIUM CHLORIDE (PF) 0.9 % IJ SOLN
INTRAMUSCULAR | Status: AC
  Filled 2018-05-28: qty 50

## 2018-05-28 MED ORDER — HEPARIN SOD (PORK) LOCK FLUSH 100 UNIT/ML IV SOLN: 500.0000 [IU] | Freq: Once | INTRAVENOUS | Status: DC

## 2018-05-28 MED ORDER — IOHEXOL 300 MG/ML  SOLN
100.0000 mL | Freq: Once | INTRAMUSCULAR | Status: AC | PRN
  Administered 2018-05-28: 100 mL via INTRAVENOUS

## 2018-05-28 MED ORDER — HEPARIN SOD (PORK) LOCK FLUSH 100 UNIT/ML IV SOLN
INTRAVENOUS | Status: AC
  Filled 2018-05-28: qty 5

## 2018-05-28 MED ORDER — SODIUM CHLORIDE 0.9% FLUSH
10.0000 mL | Freq: Once | INTRAVENOUS | Status: AC
  Administered 2018-05-28: 10 mL
  Filled 2018-05-28: qty 10

## 2018-05-28 NOTE — Progress Notes (Signed)
Left lower extremity venous duplex completed. Refer to "CV Proc" under chart review to view preliminary results.  05/28/2018 3:22 PM Maudry Mayhew, MHA, RVT, RDCS, RDMS

## 2018-05-29 ENCOUNTER — Inpatient Hospital Stay (HOSPITAL_BASED_OUTPATIENT_CLINIC_OR_DEPARTMENT_OTHER): Payer: Self-pay | Admitting: Hematology and Oncology

## 2018-05-29 ENCOUNTER — Telehealth: Payer: Self-pay | Admitting: Hematology and Oncology

## 2018-05-29 ENCOUNTER — Encounter: Payer: Self-pay | Admitting: Hematology and Oncology

## 2018-05-29 DIAGNOSIS — Z90722 Acquired absence of ovaries, bilateral: Secondary | ICD-10-CM

## 2018-05-29 DIAGNOSIS — C541 Malignant neoplasm of endometrium: Secondary | ICD-10-CM

## 2018-05-29 DIAGNOSIS — T451X5A Adverse effect of antineoplastic and immunosuppressive drugs, initial encounter: Secondary | ICD-10-CM

## 2018-05-29 DIAGNOSIS — E559 Vitamin D deficiency, unspecified: Secondary | ICD-10-CM

## 2018-05-29 DIAGNOSIS — Z87891 Personal history of nicotine dependence: Secondary | ICD-10-CM

## 2018-05-29 DIAGNOSIS — Z923 Personal history of irradiation: Secondary | ICD-10-CM

## 2018-05-29 DIAGNOSIS — Z90710 Acquired absence of both cervix and uterus: Secondary | ICD-10-CM

## 2018-05-29 DIAGNOSIS — N183 Chronic kidney disease, stage 3 unspecified: Secondary | ICD-10-CM

## 2018-05-29 DIAGNOSIS — Z9221 Personal history of antineoplastic chemotherapy: Secondary | ICD-10-CM

## 2018-05-29 DIAGNOSIS — I5032 Chronic diastolic (congestive) heart failure: Secondary | ICD-10-CM

## 2018-05-29 DIAGNOSIS — D6481 Anemia due to antineoplastic chemotherapy: Secondary | ICD-10-CM

## 2018-05-29 LAB — VITAMIN D 25 HYDROXY (VIT D DEFICIENCY, FRACTURES): Vit D, 25-Hydroxy: 20.3 ng/mL — ABNORMAL LOW (ref 30.0–100.0)

## 2018-05-29 MED ORDER — VITAMIN D (ERGOCALCIFEROL) 1.25 MG (50000 UNIT) PO CAPS: 50000.0000 [IU] | ORAL_CAPSULE | ORAL | 0 refills | Status: DC

## 2018-05-29 MED FILL — VIT D2 1.25 MG (50,000 UNIT: 1.25 MG | 84 days supply | Qty: 12 | Fill #0

## 2018-05-29 NOTE — Assessment & Plan Note (Signed)
This is likely due to recent treatment. The patient denies recent history of bleeding such as epistaxis, hematuria or hematochezia. She is asymptomatic from the anemia. I will observe for now.   

## 2018-05-29 NOTE — Assessment & Plan Note (Signed)
She has chronic vitamin D deficiency which I think contributed to her symptoms of diffuse joint pain I recommend high-dose replacement therapy. He needs close follow-up with primary care doctor for future management

## 2018-05-29 NOTE — Telephone Encounter (Signed)
Per 12/10 los, return for no new orders. °

## 2018-05-29 NOTE — Progress Notes (Signed)
[place] OFFICE PROGRESS NOTE  Patient Care Team: Charlott Rakes, MD as PCP - General (Family Medicine) Park Liter, MD as PCP - Cardiology (Cardiology)  ASSESSMENT & PLAN:  Endometrial cancer [facility]) I have reviewed imaging studies with the patient and family She has no evidence of cancer in her recent imaging I recommend port removal She has appointment to follow with radiation oncologist and GYN oncologist over the next 6 months.  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 The patient denies recent history of bleeding such as epistaxis, hematuria or hematochezia. She is asymptomatic from the anemia. I will observe for now.   Chronic kidney disease (CKD), stage III (moderate) (HCC) She has recent improved kidney function.  We discussed importance of adequate hydration therapy and close follow-up with nephrologist  Diastolic congestive heart failure, NYHA class 3 (HCC) Recent venous Doppler showed no evidence of DVT CT scan of the chest show no evidence of significant fluid overload I suspect her leg edema is due to fluid retention due to recent salt ingestion around the holidays and mild heart failure She will continue her medications as prescribed We discussed importance of close follow-up with primary care doctor and cardiologist   Vitamin D deficiency She has chronic vitamin D deficiency which I think contributed to her symptoms of diffuse joint pain I recommend high-dose replacement therapy. He needs close follow-up with primary care doctor for future management   Orders Placed This Encounter  Procedures  . IR REMOVAL TUN ACCESS W/ PORT W/O FL MOD SED    Standing Status:   Future    Standing Expiration Date:   07/31/2019    Order Specific Question:   Reason for exam:    Answer:   no need port anymore    Order Specific Question:   Preferred Imaging Location?    Answer:   [facility]    INTERVAL  HISTORY: Please see below for problem oriented charting. She returns with her sister for further follow-up She feels well She complained of mild bilateral lower extremity edema She is drinking a lot of liquids Denies cough, chest pain or shortness of breath No recent infection, fever or chills The patient denies any recent signs or symptoms of bleeding such as spontaneous epistaxis, hematuria or hematochezia.  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 Severe endometrial thickening at 35.2 mm. Large echogenic focus noted within the endometrial canal measuring 4.7 x 1.7 x 3.0 cm.  Endometrial fluid noted. These findings are suspicious for endometrial carcinoma .  2. Associated uterine enlargement. Two probable small fibroids are noted along the posterior fundus. The largest measures 3.7 cm.  3.  Ovaries not visualized.    05/21/2015 Initial Diagnosis    She presented with postmenopausal bleeding    06/15/2015 Pathology Results    Endometrium, biopsy - FRAGMENTED INACTIVE ENDOMETRIUM. - NO ATYPIA OR MALIGNANCY    09/13/2017 Imaging    Normal LV size with severe LV hypertrophy. EF 55%. Moderate diastolic dysfunction. Normal RV size and systolic function. No significant valvular abnormalities.    09/13/2017 Imaging    1. No acute intrathoracic, abdominal, or pelvic pathology. No aortic aneurysm or dissection. No CT evidence of pulmonary embolus. 2. Cardiomegaly with left ventricular hypertrophy    09/15/2017 Imaging    US pelvis Concern for  large echogenic mass within the endometrium measuring 3.8 x 3.8 x 3.6 cm. Recommend further evaluation with sonohysterogram or hysteroscopy/biopsy.  3.8 cm fundal subserosal fibroid.     10/09/2017 Pathology Results    Endometrium, biopsy - HIGH GRADE ADENOCARCINOMA. - SEE MICROSCOPIC DESCRIPTION. Microscopic  Comment There is abundant secretory type endometrium and scattered fragments of high grade adenocarcinoma, which focally has features suggestive of high grade serous carcinoma. There is also a microscopic focus with high grade carcinoma and associated spindled cell component. In this small focus a sarcomatous component cannot be ruled out; however, carcinoma with sarcomatoid features is favored.    11/14/2017 Pathology Results    1. Lymph node, sentinel, biopsy, left external iliac - ONE BENIGN LYMPH NODE (0/1). 2. Lymph nodes, regional resection, right pelvic - FIVE BENIGN LYMPH NODES (0/5). 3. 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 - CARCINOMA INVADES 0.5 CM IN A 3 CM MYOMETRIUM. - CERVIX, BILATERAL OVARIES AND BILATERAL FALLOPIAN TUBES FREE OF TUMOR. CERVIX: - NABOTHIAN CYSTS. - NO DYSPLASIA OR MALIGNANCY. MYOMETRIUM: - ADENOMYOSIS. - LEIOMYOMATA. RIGHT AND LEFT OVARIES: - UNREMARKABLE. - NO ENDOMETRIOSIS OR MALIGNANCY. RIGHT FALLOPIAN TUBE: - PREVIOUS TUBAL LIGATION. - NO ENDOMETRIOSIS OR MALIGNANCY. LEFT FALLOPIAN TUBE: - BENIGN ADENOMATOID TUMOR, 0.8 CM. - PREVIOUS TUBAL LIGATION. - NO ENDOMETRIOSIS OR MALIGNANCY. Microscopic Comment 3. UTERUS: Procedure: Hysterectomy with bilateral salpingo-oophorectomy and left external iliac and pelvic lymph nodes. Specimen Integrity: 1.5 cm irregular defect in the endocervical canal  Tumor Size: 2.3 cm. 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 Histologic Grade (required only for adenosarcoma): High grade, grade III Myometrial Invasion (required only for adenosarcoma): 0.5 cm in a 3 cm thick myometrium. Other Tissue/ Organ Involvement: No. Margins: Free of tumor. Lymphovascular Invasion: Not identified. Regional Lymph Nodes: No lymph nodes submitted or found: 6 All lymph nodes negative  for tumor cells Number of Lymph Nodes Examined: 6 Pathologic Stage Classification (pTNM, AJCC 8th Edition): pT1a, pN0 Ancillary Studies: Immunohistochemistry for cytokeratin AE1/AE3, cytokeratin 8/18, cytokeratin 903, cytokeratin 5/6 and p53. Representative Tumor Block: 3E, 41F, 3H, 3I, and 3J. Comment(s): The tumor is partially polypoid, is 2.3 cm in greatest dimension and has features of a high grade serous carcinoma. There is also an undifferentiated component with sarcomatoid features and immunohistochemistry shows positivity with cytokeratin 8/18 and is negative with cytokeratin AE1/AE3, cytokeratin 5/6, cytokeratin 903 and p53. The immunophenotype is most consistent with high grade carcinoma with sarcomatoid features. Immunohistochemistry for cytokeratin AE1/AE3 is performed on all of the lymph nodes (parts 1 and 2) and no positivity is identified.    11/14/2017 Surgery    Assistants: Dr Lahoma Crocker (an MD assistant was necessary for tissue manipulation, management of robotic instrumentation, retraction and positioning due to the complexity of the case and hospital policies).  Operation: Robotic-assisted laparoscopic total hysterectomy with bilateral salpingoophorectomy, SLN biopsy, right pelvic lymphadenectomy  Surgeon: Donaciano Eva  Operative Findings:  : 12cm bulky broad fibroid uterus. Pelvic adnexal adhesions between right ovary and fallopian tube and pelvic sidewall. Bladder adhesions from prior cesarean section. Grossly normal appearing ovaries. No apparent extrauterine disease. Multiple subserosal fibroids. Unilateral dye mapping to left. No suspicious lymph nodes.   Estimated Blood Loss:  less than 50 mL      Total IV Fluids: 800 ml         Specimens: uterus, cervix, bilateral tubes and ovaries, right pelvic lymph nodes, left external iliac  sentinel lymph node.          Complications:  None; patient tolerated the procedure well.      11/16/2017 Imaging     CT head No acute intracranial abnormalities to explain the patient's symptoms.    11/24/2017 Cancer Staging    Staging form: Corpus Uteri - Carcinoma and Carcinosarcoma, AJCC 8th Edition - Pathologic: Stage IA (pT1a, pN0, cM0) - Signed by Heath Lark, MD on 11/24/2017    12/06/2017 Procedure    Status post right IJ port catheter placement. Catheter ready for use    12/14/2017 - 05/03/2018 Chemotherapy    The patient had carboplatin and taxol x 6 cycles    12/22/2017 Imaging    1. No convincing evidence of fistula between the lower urinary tract and vagina. 2. There is a small low-attenuation collection in the pelvic cul-de-sac adjacent to the vaginal cuff measuring 2.6 x 1.9 x 2.0 cm (volume = 5.2 cm^3). This may represent a small amount of ascites or postoperative hematoma/seroma and could represent a potential source for vaginal discharge.     05/28/2018 Imaging    1. Two upper normal sized right pelvic lymph nodes are observed but are not overtly pathologically enlarged. One of these is actually slightly smaller than on 12/22/2017. 2. Aortic Atherosclerosis (ICD10-I70.0). 3. Degenerative disc disease at L4-5. 4. Mild cardiomegaly.     REVIEW OF SYSTEMS:   Constitutional: Denies fevers, chills or abnormal weight loss Eyes: Denies blurriness of vision Ears, nose, mouth, throat, and face: Denies mucositis or sore throat Respiratory: Denies cough, dyspnea or wheezes Cardiovascular: Denies palpitation, chest discomfort  Gastrointestinal:  Denies nausea, heartburn or change in bowel habits Skin: Denies abnormal skin rashes Lymphatics: Denies new lymphadenopathy or easy bruising Neurological:Denies numbness, tingling or new weaknesses Behavioral/Psych: Mood is stable, no new changes  All other systems were reviewed with the patient and are negative.  I have reviewed the past medical history, past surgical history, social history and family history with the patient and they are unchanged  from previous note.  ALLERGIES:  is allergic to vicodin [hydrocodone-acetaminophen].  MEDICATIONS:  Current Outpatient Medications  Medication Sig Dispense Refill  . acetaminophen (TYLENOL) 500 MG tablet Take 1,000 mg by mouth every 6 (six) hours as needed.    Marland Kitchen amLODipine (NORVASC) 10 MG tablet Take 1 tablet (10 mg total) by mouth daily. 30 tablet 6  . dexamethasone (DECADRON) 4 MG tablet Take 5 tabs at the night before and 5 tab the morning of chemotherapy, every 3 weeks, by mouth (Patient not taking: Reported on 05/25/2018) 60 tablet 0  . furosemide (LASIX) 40 MG tablet Take 1 tablet (40 mg total) by mouth 2 (two) times daily. For 1 week then resume 1 tablet daily 45 tablet 3  . hydrALAZINE (APRESOLINE) 25 MG tablet Take 1 tablet (25 mg total) by mouth 3 (three) times daily. 90 tablet 6  . ibuprofen (ADVIL,MOTRIN) 200 MG tablet Take 400 mg by mouth every 6 (six) hours as needed.    . magic mouthwash w/lidocaine SOLN Take 5 mLs by mouth 4 (four) times daily as needed for mouth pain. Swish and spit (Patient not taking: Reported on 05/25/2018) 140 mL 0  . metoprolol tartrate (LOPRESSOR) 25 MG tablet Take 1 tablet (25 mg total) by mouth 2 (two) times daily. 60 tablet 6  . polyethylene glycol (MIRALAX / GLYCOLAX) packet Take 17 g by mouth daily as needed for mild constipation.     . senna (SENOKOT) 8.6 MG TABS  tablet Take 1 tablet by mouth daily.    . Vitamin D, Ergocalciferol, (DRISDOL) 1.25 MG (50000 UT) CAPS capsule Take 1 capsule (50,000 Units total) by mouth every 7 (seven) days. 12 capsule 0   No current facility-administered medications for this visit.     PHYSICAL EXAMINATION: ECOG PERFORMANCE STATUS: 1 - Symptomatic but completely ambulatory  Vitals:   05/29/18 0951  BP: (!) 151/82  Pulse: 76  Resp: 18  Temp: 97.7 F (36.5 C)  SpO2: 94%   Filed Weights   05/29/18 0951  Weight: 274 lb (124.3 kg)    GENERAL:alert, no distress and comfortable SKIN: skin color, texture,  turgor are normal, no rashes or significant lesions HEART: noted moderate lower extremity edema Musculoskeletal:no cyanosis of digits and no clubbing  NEURO: alert & oriented x 3 with fluent speech, no focal motor/sensory deficits  LABORATORY DATA:  I have reviewed the data as listed    Component Value Date/Time   NA 140 05/28/2018 1135   NA 141 03/29/2018 0000   K 3.7 05/28/2018 1135   CL 105 05/28/2018 1135   CO2 24 05/28/2018 1135   GLUCOSE 99 05/28/2018 1135   BUN 23 (H) 05/28/2018 1135   BUN 32 (H) 03/29/2018 0000   CREATININE 1.14 (H) 05/28/2018 1135   CALCIUM 9.4 05/28/2018 1135   PROT 7.1 05/28/2018 1135   ALBUMIN 3.7 05/28/2018 1135   AST 14 (L) 05/28/2018 1135   ALT 12 05/28/2018 1135   ALKPHOS 97 05/28/2018 1135   BILITOT 0.2 (L) 05/28/2018 1135   GFRNONAA 54 (L) 05/28/2018 1135   GFRAA >60 05/28/2018 1135    No results found for: SPEP, UPEP  Lab Results  Component Value Date   WBC 3.5 (L) 05/28/2018   NEUTROABS 1.7 05/28/2018   HGB 9.6 (L) 05/28/2018   HCT 29.6 (L) 05/28/2018   MCV 99.3 05/28/2018   PLT 152 05/28/2018      Chemistry      Component Value Date/Time   NA 140 05/28/2018 1135   NA 141 03/29/2018 0000   K 3.7 05/28/2018 1135   CL 105 05/28/2018 1135   CO2 24 05/28/2018 1135   BUN 23 (H) 05/28/2018 1135   BUN 32 (H) 03/29/2018 0000   CREATININE 1.14 (H) 05/28/2018 1135      Component Value Date/Time   CALCIUM 9.4 05/28/2018 1135   ALKPHOS 97 05/28/2018 1135   AST 14 (L) 05/28/2018 1135   ALT 12 05/28/2018 1135   BILITOT 0.2 (L) 05/28/2018 1135       RADIOGRAPHIC STUDIES: I have personally reviewed the radiological images as listed and agreed with the findings in the report. Ct Chest W Contrast  Result Date: 05/28/2018 CLINICAL DATA:  Restaging endometrial cancer. EXAM: CT CHEST, ABDOMEN, AND PELVIS WITH CONTRAST TECHNIQUE: Multidetector CT imaging of the chest, abdomen and pelvis was performed following the standard protocol  during bolus administration of intravenous contrast. CONTRAST:  125m OMNIPAQUE IOHEXOL 300 MG/ML  SOLN COMPARISON:  Multiple exams, including CT pelvis 12/22/2017 and CT chest 09/12/2017 FINDINGS: CT CHEST FINDINGS Cardiovascular: Right Port-A-Cath tip: Cavoatrial junction. Mild descending thoracic aortic atherosclerotic calcification. Mild cardiomegaly. Mediastinum/Nodes: Unremarkable Lungs/Pleura: Unremarkable Musculoskeletal: Thoracic spondylosis. CT ABDOMEN PELVIS FINDINGS Hepatobiliary: Cholecystectomy.  Otherwise unremarkable. Pancreas: Unremarkable Spleen: Unremarkable Adrenals/Urinary Tract: Unremarkable Stomach/Bowel: Unremarkable Vascular/Lymphatic: Right external iliac node 0.9 cm in short axis on image 101/2, formerly 0.5 cm on 12/22/2017. Another right external iliac node measures 0.8 cm in short axis on image 102/2, formerly 0.9  cm. No overtly pathologic pelvic adenopathy is observed. Reproductive: Uterus absent.  Adnexa unremarkable. Other: No supplemental non-categorized findings. Musculoskeletal: Degenerative disc disease at L4-5. IMPRESSION: 1. Two upper normal sized right pelvic lymph nodes are observed but are not overtly pathologically enlarged. One of these is actually slightly smaller than on 12/22/2017. 2.  Aortic Atherosclerosis (ICD10-I70.0). 3. Degenerative disc disease at L4-5. 4. Mild cardiomegaly. Electronically Signed   By: Van Clines M.D.   On: 05/28/2018 16:04   Ct Abdomen Pelvis W Contrast  Result Date: 05/28/2018 CLINICAL DATA:  Restaging endometrial cancer. EXAM: CT CHEST, ABDOMEN, AND PELVIS WITH CONTRAST TECHNIQUE: Multidetector CT imaging of the chest, abdomen and pelvis was performed following the standard protocol during bolus administration of intravenous contrast. CONTRAST:  1101m OMNIPAQUE IOHEXOL 300 MG/ML  SOLN COMPARISON:  Multiple exams, including CT pelvis 12/22/2017 and CT chest 09/12/2017 FINDINGS: CT CHEST FINDINGS Cardiovascular: Right Port-A-Cath  tip: Cavoatrial junction. Mild descending thoracic aortic atherosclerotic calcification. Mild cardiomegaly. Mediastinum/Nodes: Unremarkable Lungs/Pleura: Unremarkable Musculoskeletal: Thoracic spondylosis. CT ABDOMEN PELVIS FINDINGS Hepatobiliary: Cholecystectomy.  Otherwise unremarkable. Pancreas: Unremarkable Spleen: Unremarkable Adrenals/Urinary Tract: Unremarkable Stomach/Bowel: Unremarkable Vascular/Lymphatic: Right external iliac node 0.9 cm in short axis on image 101/2, formerly 0.5 cm on 12/22/2017. Another right external iliac node measures 0.8 cm in short axis on image 102/2, formerly 0.9 cm. No overtly pathologic pelvic adenopathy is observed. Reproductive: Uterus absent.  Adnexa unremarkable. Other: No supplemental non-categorized findings. Musculoskeletal: Degenerative disc disease at L4-5. IMPRESSION: 1. Two upper normal sized right pelvic lymph nodes are observed but are not overtly pathologically enlarged. One of these is actually slightly smaller than on 12/22/2017. 2.  Aortic Atherosclerosis (ICD10-I70.0). 3. Degenerative disc disease at L4-5. 4. Mild cardiomegaly. Electronically Signed   By: WVan ClinesM.D.   On: 05/28/2018 16:04   Vas [place]Lower Extremity Venous (dvt)  Result Date: 05/28/2018  Lower Venous Study Indications: Swelling, and Pain.  Performing Technologist: MMaudry MayhewMHA, RDMS, RVT, RDCS  Examination Guidelines: A complete evaluation includes B-mode imaging, spectral Doppler, color Doppler, and power Doppler as needed of all accessible portions of each vessel. Bilateral testing is considered an integral part of a complete examination. Limited examinations for reoccurring indications may be performed as noted.  Right Venous Findings: +---+---------------+---------+-----------+----------+-------+    CompressibilityPhasicitySpontaneityPropertiesSummary +---+---------------+---------+-----------+----------+-------+ CFVFull           Yes      Yes                           +---+---------------+---------+-----------+----------+-------+  Left Venous Findings: +---------+---------------+---------+-----------+----------+-------+          CompressibilityPhasicitySpontaneityPropertiesSummary +---------+---------------+---------+-----------+----------+-------+ CFV      Full           Yes      Yes                          +---------+---------------+---------+-----------+----------+-------+ SFJ      Full                                                 +---------+---------------+---------+-----------+----------+-------+ FV Prox  Full                                                 +---------+---------------+---------+-----------+----------+-------+  FV Mid   Full                                                 +---------+---------------+---------+-----------+----------+-------+ FV DistalFull                                                 +---------+---------------+---------+-----------+----------+-------+ PFV      Full                                                 +---------+---------------+---------+-----------+----------+-------+ POP      Full           Yes      Yes                          +---------+---------------+---------+-----------+----------+-------+ PTV      Full                                                 +---------+---------------+---------+-----------+----------+-------+ PERO     Full                                                 +---------+---------------+---------+-----------+----------+-------+    Summary: Right: No evidence of common femoral vein obstruction. Left: There is no evidence of deep vein thrombosis in the lower extremity. No cystic structure found in the popliteal fossa.  *See table(s) above for measurements and observations. Electronically signed by Servando Snare MD on 05/28/2018 at 4:51:09 PM.    Final     All questions were answered. The patient knows to call the clinic  with any problems, questions or concerns. No barriers to learning was detected.  I spent 25 minutes counseling the patient face to face. The total time spent in the appointment was 30 minutes and more than 50% was on counseling and review of test results  Heath Lark, MD 05/29/2018 10:32 AM

## 2018-05-29 NOTE — Assessment & Plan Note (Addendum)
Recent venous Doppler showed no evidence of DVT CT scan of the chest show no evidence of significant fluid overload I suspect her leg edema is due to fluid retention due to recent salt ingestion around the holidays and mild heart failure She will continue her medications as prescribed We discussed importance of close follow-up with primary care doctor and cardiologist

## 2018-05-29 NOTE — Assessment & Plan Note (Signed)
She has recent improved kidney function.  We discussed importance of adequate hydration therapy and close follow-up with nephrologist

## 2018-05-29 NOTE — Assessment & Plan Note (Signed)
I have reviewed imaging studies with the patient and family She has no evidence of cancer in her recent imaging I recommend port removal She has appointment to follow with radiation oncologist and GYN oncologist over the next 6 months.

## 2018-06-08 ENCOUNTER — Telehealth (HOSPITAL_COMMUNITY): Payer: Self-pay | Admitting: *Deleted

## 2018-06-08 NOTE — Telephone Encounter (Signed)
Opened in error

## 2018-06-10 MED FILL — hydrALAZINE HCL 25 MG TABS: 25 | 30 days supply | Qty: 90 | Fill #2

## 2018-06-10 MED FILL — METOPROLOL TARTRATE 25 MG T: 25 | 30 days supply | Qty: 60 | Fill #2

## 2018-06-11 MED FILL — FUROSEMIDE 40 MG TAB: 40 | 45 days supply | Qty: 45 | Fill #2

## 2018-06-18 ENCOUNTER — Other Ambulatory Visit: Payer: Self-pay | Admitting: Radiology

## 2018-06-19 ENCOUNTER — Encounter (HOSPITAL_COMMUNITY): Payer: Self-pay

## 2018-06-19 ENCOUNTER — Ambulatory Visit (HOSPITAL_COMMUNITY)
Admission: RE | Admit: 2018-06-19 | Discharge: 2018-06-19 | Disposition: A | Discharge status: Home / Self Care | Payer: Medicaid Other | Source: Ambulatory Visit | Attending: Hematology and Oncology | Admitting: Hematology and Oncology

## 2018-06-19 ENCOUNTER — Other Ambulatory Visit: Payer: Self-pay

## 2018-06-19 DIAGNOSIS — Z452 Encounter for adjustment and management of vascular access device: Secondary | ICD-10-CM | POA: Insufficient documentation

## 2018-06-19 DIAGNOSIS — I110 Hypertensive heart disease with heart failure: Secondary | ICD-10-CM | POA: Insufficient documentation

## 2018-06-19 DIAGNOSIS — Z90722 Acquired absence of ovaries, bilateral: Secondary | ICD-10-CM | POA: Insufficient documentation

## 2018-06-19 DIAGNOSIS — I509 Heart failure, unspecified: Secondary | ICD-10-CM | POA: Insufficient documentation

## 2018-06-19 DIAGNOSIS — Z885 Allergy status to narcotic agent status: Secondary | ICD-10-CM | POA: Insufficient documentation

## 2018-06-19 DIAGNOSIS — Z8542 Personal history of malignant neoplasm of other parts of uterus: Secondary | ICD-10-CM | POA: Insufficient documentation

## 2018-06-19 DIAGNOSIS — E785 Hyperlipidemia, unspecified: Secondary | ICD-10-CM | POA: Insufficient documentation

## 2018-06-19 DIAGNOSIS — Z90710 Acquired absence of both cervix and uterus: Secondary | ICD-10-CM | POA: Insufficient documentation

## 2018-06-19 DIAGNOSIS — Z79899 Other long term (current) drug therapy: Secondary | ICD-10-CM | POA: Insufficient documentation

## 2018-06-19 DIAGNOSIS — C541 Malignant neoplasm of endometrium: Secondary | ICD-10-CM

## 2018-06-19 HISTORY — PX: IR REMOVAL TUN ACCESS W/ PORT W/O FL MOD SED: IMG2290

## 2018-06-19 LAB — BASIC METABOLIC PANEL
Anion gap: 10 (ref 5–15)
BUN: 23 mg/dL — ABNORMAL HIGH (ref 6–20)
CO2: 26 mmol/L (ref 22–32)
Calcium: 9.4 mg/dL (ref 8.9–10.3)
Chloride: 105 mmol/L (ref 98–111)
Creatinine, Ser: 1.5 mg/dL — ABNORMAL HIGH (ref 0.44–1.00)
GFR calc Af Amer: 45 mL/min — ABNORMAL LOW (ref >60)
GFR calc non Af Amer: 39 mL/min — ABNORMAL LOW (ref >60)
Glucose, Bld: 103 mg/dL — ABNORMAL HIGH (ref 70–99)
Potassium: 3.3 mmol/L — ABNORMAL LOW (ref 3.5–5.1)
SODIUM: 141 mmol/L (ref 135–145)

## 2018-06-19 LAB — PROTIME-INR
INR: 0.85
Prothrombin Time: 11.6 seconds (ref 11.4–15.2)

## 2018-06-19 LAB — CBC WITH DIFFERENTIAL/PLATELET
Abs Immature Granulocytes: 0.05 10*3/uL (ref 0.00–0.07)
Basophils Absolute: 0 10*3/uL (ref 0.0–0.1)
Basophils Relative: 0 %
Eosinophils Absolute: 0 10*3/uL (ref 0.0–0.5)
Eosinophils Relative: 1 %
HCT: 32.7 % — ABNORMAL LOW (ref 36.0–46.0)
Hemoglobin: 10.3 g/dL — ABNORMAL LOW (ref 12.0–15.0)
Immature Granulocytes: 2 %
Lymphocytes Relative: 41 %
Lymphs Abs: 1.4 10*3/uL (ref 0.7–4.0)
MCH: 31.2 pg (ref 26.0–34.0)
MCHC: 31.5 g/dL (ref 30.0–36.0)
MCV: 99.1 fL (ref 80.0–100.0)
MONO ABS: 0.4 10*3/uL (ref 0.1–1.0)
Monocytes Relative: 11 %
Neutro Abs: 1.5 10*3/uL — ABNORMAL LOW (ref 1.7–7.7)
Neutrophils Relative %: 45 %
Platelets: 238 10*3/uL (ref 150–400)
RBC: 3.3 MIL/uL — AB (ref 3.87–5.11)
RDW: 14.6 % (ref 11.5–15.5)
WBC: 3.4 10*3/uL — AB (ref 4.0–10.5)
nRBC: 0 % (ref 0.0–0.2)

## 2018-06-19 MED ORDER — CEFAZOLIN SODIUM-DEXTROSE 2-4 GM/100ML-% IV SOLN
INTRAVENOUS | Status: AC
  Administered 2018-06-19: 2 g via INTRAVENOUS
  Filled 2018-06-19: qty 100

## 2018-06-19 MED ORDER — MIDAZOLAM HCL 2 MG/2ML IJ SOLN
INTRAMUSCULAR | Status: AC | PRN
  Administered 2018-06-19 (×2): 1 mg via INTRAVENOUS

## 2018-06-19 MED ORDER — FENTANYL CITRATE (PF) 100 MCG/2ML IJ SOLN
INTRAMUSCULAR | Status: AC
  Filled 2018-06-19: qty 2

## 2018-06-19 MED ORDER — CEFAZOLIN SODIUM-DEXTROSE 2-4 GM/100ML-% IV SOLN
2.0000 g | INTRAVENOUS | Status: AC
  Administered 2018-06-19: 2 g via INTRAVENOUS

## 2018-06-19 MED ORDER — LIDOCAINE-EPINEPHRINE (PF) 2 %-1:200000 IJ SOLN
INTRAMUSCULAR | Status: AC
  Filled 2018-06-19: qty 10

## 2018-06-19 MED ORDER — LIDOCAINE-EPINEPHRINE (PF) 1 %-1:200000 IJ SOLN
INTRAMUSCULAR | Status: AC | PRN
  Administered 2018-06-19: 10 mL

## 2018-06-19 MED ORDER — FENTANYL CITRATE (PF) 100 MCG/2ML IJ SOLN
INTRAMUSCULAR | Status: AC | PRN
  Administered 2018-06-19 (×2): 50 ug via INTRAVENOUS

## 2018-06-19 MED ORDER — SODIUM CHLORIDE 0.9 % IV SOLN
INTRAVENOUS | Status: DC
  Administered 2018-06-19: 11:00:00 via INTRAVENOUS

## 2018-06-19 MED ORDER — MIDAZOLAM HCL 2 MG/2ML IJ SOLN
INTRAMUSCULAR | Status: AC
  Filled 2018-06-19: qty 2

## 2018-06-19 NOTE — Procedures (Signed)
  Procedure: R IJ Port catheter removal   EBL:   minimal Complications:  none immediate  See full dictation in [facility].  Dillard Cannon MD Main # [phone] Pager  [phone]

## 2018-06-19 NOTE — H&P (Signed)
Referring Physician(s): Gorsuch,Ni  Supervising Physician: Arne Cleveland  Patient Status:  Angela Gardner OP   Chief Complaint:  "I'm getting my port out"  Subjective: Patient familiar to IR service from prior Port-A-Cath placement on 12/06/2017.  She has a history of endometrial carcinoma, status post surgery and chemotherapy.  She has no evidence of recurrent disease on imaging and is no longer receiving treatment.  She presents today for Port-A-Cath removal.  She currently denies fever, headache, chest pain, dyspnea, cough, abdominal/back pain, nausea, vomiting or bleeding.  Past Medical History:  Diagnosis Date  . Anemia   . 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 CHF (congestive heart failure) ([place])   . Dyspnea    still having this and not moving around alot -gets worse with exertion  . History of sleep walking   . Hx of left breast biopsy   . Hyperlipemia   . Hypertension    not on any medication now-[facility] had  prescribed her a med and she stopped taking them cannot remember when   Past Surgical History:  Procedure Laterality Date  . BREAST BIOPSY     left breast  . CESAREAN SECTION     one  . CHOLECYSTECTOMY    . DILATION AND CURETTAGE OF UTERUS     approx 1 year ago  . IR IMAGING GUIDED PORT INSERTION  12/06/2017  . LYMPH NODE BIOPSY N/A 11/14/2017   Procedure: SENTINEL LYMPH NODE BIOPSY;  Surgeon: Everitt Amber, MD;  Location: [facility];  Service: Gynecology;  Laterality: N/A;  . ROBOTIC ASSISTED TOTAL HYSTERECTOMY WITH BILATERAL SALPINGO OOPHERECTOMY N/A 11/14/2017   Procedure: XI ROBOTIC ASSISTED TOTAL HYSTERECTOMY WITH BILATERAL SALPINGO OOPHORECTOMY, RIGHT PELVIC LYMPHADENECTOMY;  Surgeon: Everitt Amber, MD;  Location: [facility];  Service: Gynecology;  Laterality: N/A;      Allergies: Vicodin [hydrocodone-acetaminophen]  Medications: Prior to Admission medications   Medication Sig Start Date End Date Taking? Authorizing Provider  amLODipine (NORVASC) 10 MG  tablet Take 1 tablet (10 mg total) by mouth daily. 04/02/18 07/01/18 Yes Charlott Rakes, MD  furosemide (LASIX) 40 MG tablet Take 1 tablet (40 mg total) by mouth 2 (two) times daily. For 1 week then resume 1 tablet daily 04/05/18  Yes Gorsuch, Ni, MD  hydrALAZINE (APRESOLINE) 25 MG tablet Take 1 tablet (25 mg total) by mouth 3 (three) times daily. 04/05/18  Yes Gorsuch, Ni, MD  ibuprofen (ADVIL,MOTRIN) 200 MG tablet Take 400 mg by mouth every 6 (six) hours as needed.   Yes [provider]  metoprolol tartrate (LOPRESSOR) 25 MG tablet Take 1 tablet (25 mg total) by mouth 2 (two) times daily. 04/05/18  Yes Gorsuch, Ernst Spell, MD  Vitamin D, Ergocalciferol, (DRISDOL) 1.25 MG (50000 UT) CAPS capsule Take 1 capsule (50,000 Units total) by mouth every 7 (seven) days. 05/29/18  Yes Gorsuch, Ni, MD  acetaminophen (TYLENOL) 500 MG tablet Take 1,000 mg by mouth every 6 (six) hours as needed.    [provider]  dexamethasone (DECADRON) 4 MG tablet Take 5 tabs at the night before and 5 tab the morning of chemotherapy, every 3 weeks, by mouth Patient not taking: Reported on 05/25/2018 12/08/17   Heath Lark, MD  magic mouthwash w/lidocaine SOLN Take 5 mLs by mouth 4 (four) times daily as needed for mouth pain. Swish and spit Patient not taking: Reported on 05/25/2018 02/07/18   Gery Pray, MD  polyethylene glycol [facility] Floria Raveling) packet Take 17 g by mouth daily as needed for  mild constipation.     [provider]  senna (SENOKOT) 8.6 MG TABS tablet Take 1 tablet by mouth daily.    [provider]  prochlorperazine (COMPAZINE) 10 MG tablet Take 1 tablet (10 mg total) by mouth every 6 (six) hours as needed (Nausea or vomiting). Patient not taking: Reported on 05/25/2018 12/08/17 05/29/18  Heath Lark, MD     Vital Signs: BP (!) 160/106 (BP Location: Right Arm)   Pulse 75   Temp 98.6 F (37 C) (Oral)   Resp 18   LMP  (LMP Unknown) Comment: vaginal bleeding continuously for last  8 months  SpO2 100%   Physical Exam awake, alert.  Chest clear to auscultation bilaterally.  Clean, intact right chest wall Port-A-Cath.  Heart with regular rate and rhythm.  Abdomen obese, soft, positive bowel sounds, nontender.  Bilateral lower extremity edema noted.  Imaging: No results found.  Labs:  CBC: Recent Labs    04/26/18 0938 05/02/18 0951 05/28/18 1135 06/19/18 1021  WBC 5.4 4.9 3.5* 3.4*  HGB 10.3* 9.9* 9.6* 10.3*  HCT 32.5* 31.2* 29.6* 32.7*  PLT 226 155 152 238    COAGS: Recent Labs    12/06/17 1148 06/19/18 1021  INR 0.89 0.85    BMP: Recent Labs    04/26/18 0938 05/02/18 0951 05/28/18 1135 06/19/18 1021  NA 140 141 140 141  K 4.4 3.7 3.7 3.3*  CL 102 105 105 105  CO2 25 26 24 26   GLUCOSE 142* 102* 99 103*  BUN 33* 29* 23* 23*  CALCIUM 10.1 9.4 9.4 9.4  CREATININE 1.83* 1.40* 1.14* 1.50*  GFRNONAA 30* 41* 54* 39*  GFRAA 34* 48* >60 45*    LIVER FUNCTION TESTS: Recent Labs    04/05/18 1025 04/26/18 0938 05/02/18 0951 05/28/18 1135  BILITOT 0.4 0.3 0.4 0.2*  AST 15 21 18  14*  ALT 18 21 21 12   ALKPHOS 98 106 93 97  PROT 7.4 8.0 7.2 7.1  ALBUMIN 3.8 3.9 3.6 3.7    Assessment and Plan: Pt with  history of endometrial carcinoma, status post surgery and chemotherapy.  She has no evidence of recurrent disease on imaging and is no longer receiving treatment.  She presents today for Port-A-Cath removal.  Details/risks of procedure, including but not limited to, internal bleeding, infection, injury to adjacent structures discussed with patient with her understanding and consent.   Electronically Signed: D. Rowe Robert, PA-C 06/19/2018, 11:16 AM   I spent a total of 20 minutes at the the patient's bedside AND on the patient's hospital floor or unit, greater than 50% of which was counseling/coordinating care for Port-A-Cath removal

## 2018-06-19 NOTE — Discharge Instructions (Signed)
Implanted Port Removal, Care After °This sheet gives you information about how to care for yourself after your procedure. Your health care provider may also give you more specific instructions. If you have problems or questions, contact your health care provider. °What can I expect after the procedure? °After the procedure, it is common to have: °· Soreness or pain near your incision. °· Some swelling or bruising near your incision. °Follow these instructions at home: °Medicines °· Take over-the-counter and prescription medicines only as told by your health care provider. °· If you were prescribed an antibiotic medicine, take it as told by your health care provider. Do not stop taking the antibiotic even if you start to feel better. °Bathing °· Do not take baths, swim, or use a hot tub until your health care provider approves. Ask your health care provider if you can take showers. You may only be allowed to take sponge baths. °Incision care ° °· Follow instructions from your health care provider about how to take care of your incision. Make sure you: °? Wash your hands with soap and water before you change your bandage (dressing). If soap and water are not available, use hand sanitizer. °? Change your dressing as told by your health care provider. °? Keep your dressing dry. °? Leave stitches (sutures), skin glue, or adhesive strips in place. These skin closures may need to stay in place for 2 weeks or longer. If adhesive strip edges start to loosen and curl up, you may trim the loose edges. Do not remove adhesive strips completely unless your health care provider tells you to do that. °· Check your incision area every day for signs of infection. Check for: °? More redness, swelling, or pain. °? More fluid or blood. °? Warmth. °? Pus or a bad smell. °Driving ° °· Do not drive for 24 hours if you were given a medicine to help you relax (sedative) during your procedure. °· If you did not receive a sedative, ask your  health care provider when it is safe to drive. °Activity °· Return to your normal activities as told by your health care provider. Ask your health care provider what activities are safe for you. °· Do not lift anything that is heavier than 10 lb (4.5 kg), or the limit that you are told, until your health care provider says that it is safe. °· Do not do activities that involve lifting your arms over your head. °General instructions °· Do not use any products that contain nicotine or tobacco, such as cigarettes and e-cigarettes. These can delay healing. If you need help quitting, ask your health care provider. °· Keep all follow-up visits as told by your health care provider. This is important. °Contact a health care provider if: °· You have more redness, swelling, or pain around your incision. °· You have more fluid or blood coming from your incision. °· Your incision feels warm to the touch. °· You have pus or a bad smell coming from your incision. °· You have pain that is not relieved by your pain medicine. °Get help right away if you have: °· A fever or chills. °· Chest pain. °· Difficulty breathing. °Summary °· After the procedure, it is common to have pain, soreness, swelling, or bruising near your incision. °· If you were prescribed an antibiotic medicine, take it as told by your health care provider. Do not stop taking the antibiotic even if you start to feel better. °· Do not drive for 24 hours   if you were given a sedative during your procedure. °· Return to your normal activities as told by your health care provider. Ask your health care provider what activities are safe for you. °This information is not intended to replace advice given to you by your health care provider. Make sure you discuss any questions you have with your health care provider. °Document Released: 05/18/2015 Document Revised: 07/20/2017 Document Reviewed: 07/20/2017 °Elsevier Interactive Patient Education © 2019 Elsevier  Inc[employer] ° ° °Moderate Conscious Sedation, Adult, Care After °These instructions provide you with information about caring for yourself after your procedure. Your health care provider may also give you more specific instructions. Your treatment has been planned according to current medical practices, but problems sometimes occur. Call your health care provider if you have any problems or questions after your procedure. °What can I expect after the procedure? °After your procedure, it is common: °· To feel sleepy for several hours. °· To feel clumsy and have poor balance for several hours. °· To have poor judgment for several hours. °· To vomit if you eat too soon. °Follow these instructions at home: °For at least 24 hours after the procedure: ° °· Do not: °? Participate in activities where you could fall or become injured. °? Drive. °? Use heavy machinery. °? Drink alcohol. °? Take sleeping pills or medicines that cause drowsiness. °? Make important decisions or sign legal documents. °? Take care of children on your own. °· Rest. °Eating and drinking °· Follow the diet recommended by your health care provider. °· If you vomit: °? Drink water, juice, or soup when you can drink without vomiting. °? Make sure you have little or no nausea before eating solid foods. °General instructions °· Have a responsible adult stay with you until you are awake and alert. °· Take over-the-counter and prescription medicines only as told by your health care provider. °· If you smoke, do not smoke without supervision. °· Keep all follow-up visits as told by your health care provider. This is important. °Contact a health care provider if: °· You keep feeling nauseous or you keep vomiting. °· You feel light-headed. °· You develop a rash. °· You have a fever. °Get help right away if: °· You have trouble breathing. °This information is not intended to replace advice given to you by your health care provider. Make sure you discuss any questions  you have with your health care provider. °Document Released: 03/27/2013 Document Revised: 11/09/2015 Document Reviewed: 09/26/2015 °Elsevier Interactive Patient Education © 2019 Elsevier Inc. ° °

## 2018-07-03 ENCOUNTER — Encounter: Payer: Self-pay | Admitting: Family Medicine

## 2018-07-03 ENCOUNTER — Ambulatory Visit: Payer: Medicaid Other | Attending: Family Medicine | Admitting: Family Medicine

## 2018-07-03 VITALS — BP 177/110 | HR 84 | Temp 98.1°F | Ht 64.0 in | Wt 259.4 lb

## 2018-07-03 DIAGNOSIS — Z79899 Other long term (current) drug therapy: Secondary | ICD-10-CM | POA: Diagnosis not present

## 2018-07-03 DIAGNOSIS — Z9049 Acquired absence of other specified parts of digestive tract: Secondary | ICD-10-CM | POA: Insufficient documentation

## 2018-07-03 DIAGNOSIS — I5032 Chronic diastolic (congestive) heart failure: Secondary | ICD-10-CM | POA: Insufficient documentation

## 2018-07-03 DIAGNOSIS — Z885 Allergy status to narcotic agent status: Secondary | ICD-10-CM | POA: Diagnosis not present

## 2018-07-03 DIAGNOSIS — E876 Hypokalemia: Secondary | ICD-10-CM | POA: Insufficient documentation

## 2018-07-03 DIAGNOSIS — R7303 Prediabetes: Secondary | ICD-10-CM | POA: Insufficient documentation

## 2018-07-03 DIAGNOSIS — Z923 Personal history of irradiation: Secondary | ICD-10-CM | POA: Diagnosis not present

## 2018-07-03 DIAGNOSIS — C541 Malignant neoplasm of endometrium: Secondary | ICD-10-CM

## 2018-07-03 DIAGNOSIS — Z8542 Personal history of malignant neoplasm of other parts of uterus: Secondary | ICD-10-CM

## 2018-07-03 DIAGNOSIS — I130 Hypertensive heart and chronic kidney disease with heart failure and stage 1 through stage 4 chronic kidney disease, or unspecified chronic kidney disease: Secondary | ICD-10-CM | POA: Diagnosis present

## 2018-07-03 DIAGNOSIS — Z23 Encounter for immunization: Secondary | ICD-10-CM | POA: Diagnosis not present

## 2018-07-03 DIAGNOSIS — I10 Essential (primary) hypertension: Secondary | ICD-10-CM

## 2018-07-03 DIAGNOSIS — Z1159 Encounter for screening for other viral diseases: Secondary | ICD-10-CM

## 2018-07-03 DIAGNOSIS — Z90710 Acquired absence of both cervix and uterus: Secondary | ICD-10-CM | POA: Insufficient documentation

## 2018-07-03 DIAGNOSIS — Z1211 Encounter for screening for malignant neoplasm of colon: Secondary | ICD-10-CM

## 2018-07-03 LAB — POCT GLYCOSYLATED HEMOGLOBIN (HGB A1C): HbA1c, POC (prediabetic range): 6 % (ref 5.7–6.4)

## 2018-07-03 NOTE — Progress Notes (Signed)
Subjective:  Patient ID: Angela Gardner, female    DOB: [birth year]  Age: 58 y.o. MRN: [id number]  CC: Hypertension   HPI Angela Gardner  is a 58 year old female with a history of hypertension, stage III chronic kidney disease, diastolic CHF (EF 02% from 08/2017), Endometrial carcinoma diagnosed in 09/2017 s/p total abdominal hysterectomy and bilateral salpingo-oophorectomy on 11/14/2017, status post radiation and chemotherapy who presents today for follow-up visit. Last seen by oncology Dr. Alvy Bimler on 05/29/2018, her port has been removed; no evidence of cancer and recent imaging as per notes and she has a follow-up with radiation oncologist and GYN within the next 6 months.  Blood pressure is elevated and she is yet to take her medications today.  Denies chest pains, shortness of breath; she is on Lasix for pedal edema.  Her last set of labs revealed hypokalemia with a potassium of 3.3. Has an upcoming appointment with cardiology in 07/2018. She has no acute concerns today.  Past Medical History:  Diagnosis Date  . Anemia   . 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 CHF (congestive heart failure) ([place])   . Dyspnea    still having this and not moving around alot -gets worse with exertion  . History of sleep walking   . Hx of left breast biopsy   . Hyperlipemia   . Hypertension    not on any medication now-[facility] had  prescribed her a med and she stopped taking them cannot remember when    Past Surgical History:  Procedure Laterality Date  . BREAST BIOPSY     left breast  . CESAREAN SECTION     one  . CHOLECYSTECTOMY    . DILATION AND CURETTAGE OF UTERUS     approx 1 year ago  . IR IMAGING GUIDED PORT INSERTION  12/06/2017  . IR REMOVAL TUN ACCESS W/ PORT W/O FL MOD SED  06/19/2018  . LYMPH NODE BIOPSY N/A 11/14/2017   Procedure: SENTINEL LYMPH NODE BIOPSY;  Surgeon: Everitt Amber, MD;  Location: [facility];  Service: Gynecology;  Laterality: N/A;  . ROBOTIC ASSISTED TOTAL  HYSTERECTOMY WITH BILATERAL SALPINGO OOPHERECTOMY N/A 11/14/2017   Procedure: XI ROBOTIC ASSISTED TOTAL HYSTERECTOMY WITH BILATERAL SALPINGO OOPHORECTOMY, RIGHT PELVIC LYMPHADENECTOMY;  Surgeon: Everitt Amber, MD;  Location: [facility];  Service: Gynecology;  Laterality: N/A;    Allergies  Allergen Reactions  . Vicodin [Hydrocodone-Acetaminophen] Itching     Outpatient Medications Prior to Visit  Medication Sig Dispense Refill  . acetaminophen (TYLENOL) 500 MG tablet Take 1,000 mg by mouth every 6 (six) hours as needed.    . furosemide (LASIX) 40 MG tablet Take 1 tablet (40 mg total) by mouth 2 (two) times daily. For 1 week then resume 1 tablet daily 45 tablet 3  . hydrALAZINE (APRESOLINE) 25 MG tablet Take 1 tablet (25 mg total) by mouth 3 (three) times daily. 90 tablet 6  . ibuprofen (ADVIL,MOTRIN) 200 MG tablet Take 400 mg by mouth every 6 (six) hours as needed.    . metoprolol tartrate (LOPRESSOR) 25 MG tablet Take 1 tablet (25 mg total) by mouth 2 (two) times daily. 60 tablet 6  . Vitamin D, Ergocalciferol, (DRISDOL) 1.25 MG (50000 UT) CAPS capsule Take 1 capsule (50,000 Units total) by mouth every 7 (seven) days. 12 capsule 0  . amLODipine (NORVASC) 10 MG tablet Take 1 tablet (10 mg total) by mouth daily. 30 tablet 6  . dexamethasone (DECADRON) 4 MG tablet Take 5 tabs at the  night before and 5 tab the morning of chemotherapy, every 3 weeks, by mouth (Patient not taking: Reported on 05/25/2018) 60 tablet 0  . magic mouthwash w/lidocaine SOLN Take 5 mLs by mouth 4 (four) times daily as needed for mouth pain. Swish and spit (Patient not taking: Reported on 05/25/2018) 140 mL 0  . polyethylene glycol (MIRALAX / GLYCOLAX) packet Take 17 g by mouth daily as needed for mild constipation.     . senna (SENOKOT) 8.6 MG TABS tablet Take 1 tablet by mouth daily.     No facility-administered medications prior to visit.     ROS Review of Systems  Constitutional: Negative for activity change, appetite  change and fatigue.  HENT: Negative for congestion, sinus pressure and sore throat.   Eyes: Negative for visual disturbance.  Respiratory: Negative for cough, chest tightness, shortness of breath and wheezing.   Cardiovascular: Negative for chest pain and palpitations.  Gastrointestinal: Negative for abdominal distention, abdominal pain and constipation.  Endocrine: Negative for polydipsia.  Genitourinary: Negative for dysuria and frequency.  Musculoskeletal: Negative for arthralgias and back pain.  Skin: Negative for rash.  Neurological: Negative for tremors, light-headedness and numbness.  Hematological: Does not bruise/bleed easily.  Psychiatric/Behavioral: Negative for agitation and behavioral problems.    Objective:  BP (!) 177/110   Pulse 84   Temp 98.1 F (36.7 C) (Oral)   Ht '5\' 4"'  (1.626 m)   Wt 259 lb 6.4 oz (117.7 kg)   LMP  (LMP Unknown) Comment: vaginal bleeding continuously for last 8 months  SpO2 95%   BMI 44.53 kg/m   BP/Weight 07/03/2018 06/19/2018 34/74/2595  Systolic BP 638 756 433  Diastolic BP 295 76 82  Wt. (Lbs) 259.4 - 274  BMI 44.53 - 47.03      Physical Exam Constitutional:      Appearance: She is well-developed.  Neck:     Comments: No JVD Cardiovascular:     Rate and Rhythm: Normal rate.     Heart sounds: Normal heart sounds. No murmur.  Pulmonary:     Effort: Pulmonary effort is normal.     Breath sounds: Normal breath sounds. No wheezing or rales.  Chest:     Chest wall: No tenderness.  Abdominal:     General: Bowel sounds are normal. There is no distension.     Palpations: Abdomen is soft. There is no mass.     Tenderness: There is no abdominal tenderness.  Musculoskeletal: Normal range of motion.  Neurological:     Mental Status: She is alert and oriented to person, place, and time.      CMP Latest Ref Rng & Units 06/19/2018 05/28/2018 05/02/2018  Glucose 70 - 99 mg/dL 103(H) 99 102(H)  BUN 6 - 20 mg/dL 23(H) 23(H) 29(H)    Creatinine 0.44 - 1.00 mg/dL 1.50(H) 1.14(H) 1.40(H)  Sodium 135 - 145 mmol/L 141 140 141  Potassium 3.5 - 5.1 mmol/L 3.3(L) 3.7 3.7  Chloride 98 - 111 mmol/L 105 105 105  CO2 22 - 32 mmol/L '26 24 26  ' Calcium 8.9 - 10.3 mg/dL 9.4 9.4 9.4  Total Protein 6.5 - 8.1 g/dL - 7.1 7.2  Total Bilirubin 0.3 - 1.2 mg/dL - 0.2(L) 0.4  Alkaline Phos 38 - 126 U/L - 97 93  AST 15 - 41 U/L - 14(L) 18  ALT 0 - 44 U/L - 12 21    Lab Results  Component Value Date   HGBA1C 6.0 07/03/2018    Assessment & Plan:  1. Chronic diastolic congestive heart failure, NYHA class 3 (HCC) EF 55% Euvolemic Continue current medicines and follow-up with cardiology - CMP14+EGFR  2. Endometrial cancer (HCC) Status post TAH, BSO Completed radiation and chemo  3. Essential hypertension Uncontrolled Yet to take antihypertensives Compliance emphasized Low-sodium, DASH diet  4. Prediabetes A1c of 6.0 Lifestyle modifications to prevent development of diabetes - POCT glycosylated hemoglobin (Hb A1C)  5. Screening for colon cancer - Ambulatory referral to Gastroenterology  6. Screening for viral disease - Hepatitis c antibody (reflex)  7. Need for immunization against influenza - Flu Vaccine QUAD 36+ mos IM   No orders of the defined types were placed in this encounter.   Follow-up: Return in about 3 months (around 10/02/2018) for Follow-up of chronic medical conditions.   Charlott Rakes MD

## 2018-07-03 NOTE — Progress Notes (Signed)
Patient did not take BP medication this morning.

## 2018-07-03 NOTE — Patient Instructions (Signed)

## 2018-07-04 ENCOUNTER — Other Ambulatory Visit: Payer: Self-pay | Admitting: Hematology and Oncology

## 2018-07-04 LAB — CMP14+EGFR
ALT: 11 IU/L (ref 0–32)
AST: 15 IU/L (ref 0–40)
Albumin/Globulin Ratio: 2 (ref 1.2–2.2)
Albumin: 4.6 g/dL (ref 3.5–5.5)
Alkaline Phosphatase: 94 IU/L (ref 39–117)
BUN / CREAT RATIO: 13 (ref 9–23)
BUN: 23 mg/dL (ref 6–24)
Bilirubin Total: 0.3 mg/dL (ref 0.0–1.2)
CO2: 23 mmol/L (ref 20–29)
Calcium: 9.7 mg/dL (ref 8.7–10.2)
Chloride: 107 mmol/L — ABNORMAL HIGH (ref 96–106)
Creatinine, Ser: 1.72 mg/dL — ABNORMAL HIGH (ref 0.57–1.00)
GFR calc Af Amer: 38 mL/min/{1.73_m2} — ABNORMAL LOW (ref >59)
GFR calc non Af Amer: 33 mL/min/{1.73_m2} — ABNORMAL LOW (ref >59)
Globulin, Total: 2.3 g/dL (ref 1.5–4.5)
Glucose: 105 mg/dL — ABNORMAL HIGH (ref 65–99)
Potassium: 3.8 mmol/L (ref 3.5–5.2)
Sodium: 145 mmol/L — ABNORMAL HIGH (ref 134–144)
Total Protein: 6.9 g/dL (ref 6.0–8.5)

## 2018-07-04 LAB — HCV COMMENT:

## 2018-07-04 LAB — HEPATITIS C ANTIBODY (REFLEX): HCV Ab: 0.1 s/co ratio (ref 0.0–0.9)

## 2018-07-04 MED FILL — AMLODIPINE BESYLATE 10 MG T: 10 | 30 days supply | Qty: 30 | Fill #1

## 2018-07-09 ENCOUNTER — Encounter: Payer: Self-pay | Admitting: Gastroenterology

## 2018-07-19 ENCOUNTER — Ambulatory Visit
Admission: RE | Admit: 2018-07-19 | Discharge: 2018-07-19 | Disposition: A | Discharge status: Home / Self Care | Payer: Medicaid Other | Source: Ambulatory Visit | Attending: Obstetrics and Gynecology | Admitting: Obstetrics and Gynecology

## 2018-07-19 ENCOUNTER — Encounter (HOSPITAL_COMMUNITY): Payer: Self-pay

## 2018-07-19 ENCOUNTER — Ambulatory Visit (HOSPITAL_COMMUNITY)
Admission: RE | Admit: 2018-07-19 | Discharge: 2018-07-19 | Disposition: A | Discharge status: Home / Self Care | Payer: Medicaid Other | Source: Ambulatory Visit | Attending: Obstetrics and Gynecology | Admitting: Obstetrics and Gynecology

## 2018-07-19 VITALS — BP 148/90 | Wt 260.0 lb

## 2018-07-19 DIAGNOSIS — Z1231 Encounter for screening mammogram for malignant neoplasm of breast: Secondary | ICD-10-CM

## 2018-07-19 DIAGNOSIS — Z1239 Encounter for other screening for malignant neoplasm of breast: Secondary | ICD-10-CM

## 2018-07-19 NOTE — Progress Notes (Signed)
No complaints today.   Pap Smear: Pap smear not completed today. Last Pap smear was 10/09/2017 at the [facility] and normal with positive HPV. Per patient has no history of an abnormal Pap smear. Patient has a history of a hysterectomy 11/14/2017 due to endometrial cancer. Last Pap smear result is in Epic.  Physical exam: Breasts Breasts symmetrical. Observed a scar within the left inner breast from history of lumpectomy for benign mass. No skin abnormalities observed right breast. No nipple retraction bilateral breasts. No nipple discharge bilateral breasts. No lymphadenopathy. No lumps palpated bilateral breasts. No complaints of pain or tenderness on exam. Referred patient to the [facility] for a screening mammogram. Appointment scheduled for Thursday, July 19, 2018 at 1440.        Pelvic/Bimanual No Pap smear completed today since last Pap smear and HPV typing was 10/09/2017. Pap smear not indicated per BCCCP guidelines.   Smoking History: Patient is a current smoker. Discussed smoking cessation with patient. Referred to the [facility] Quitline and gave resources to the free smoking cessation classes at [facility].  Patient Navigation: Patient education provided. Access to services provided for patient through [place] program.   Colorectal Cancer Screening: Per patient has never had a colonoscopy completed. Patient stated her PCP has referred her for a colonoscopy. No complaints today. FIT Test given to patient to complete and return to BCCCP.  Breast and Cervical Cancer Risk Assessment: Patient has no family history of breast cancer, known genetic mutations, or radiation treatment to the chest before age 51. Patient has no history of cervical dysplasia, immunocompromised, or DES exposure in-utero.  Risk Assessment    Risk Scores      07/19/2018   Last edited by: Armond Hang, LPN   5-year risk: 1.5 %   Lifetime risk: 8.2 %

## 2018-07-19 NOTE — Patient Instructions (Signed)
Explained breast self awareness with [name]. Patient did not need a Pap smear today due to last Pap smear and HPV typing was 10/09/2017. Patient is being followed by Dr. Denman George at the [facility] for her Pap smears. Referred patient to the [facility] for a screening mammogram. Appointment scheduled for Thursday, July 19, 2018 at 1440. Patient aware of appointment and will be there. Let patient know the [facility] will follow up with her within the next couple weeks with results of mammogram by letter or phone. Discussed smoking cessation with patient. Referred to the [facility] Quitline and gave resources to the free smoking cessation classes at [facility]. Angela Gardner verbalized understanding.  [place], Arvil Chaco, RN 12:55 PM

## 2018-07-20 ENCOUNTER — Encounter (HOSPITAL_COMMUNITY): Payer: Self-pay | Admitting: *Deleted

## 2018-07-23 MED FILL — VIT D2 1.25 MG (50,000 UNIT: 1.25 MG | 84 days supply | Qty: 12 | Fill #0

## 2018-07-31 ENCOUNTER — Other Ambulatory Visit: Payer: Self-pay

## 2018-08-06 ENCOUNTER — Ambulatory Visit (INDEPENDENT_AMBULATORY_CARE_PROVIDER_SITE_OTHER): Payer: Self-pay | Admitting: Cardiology

## 2018-08-06 ENCOUNTER — Encounter: Payer: Self-pay | Admitting: Cardiology

## 2018-08-06 VITALS — BP 140/80 | HR 80 | Ht 64.0 in | Wt 264.0 lb

## 2018-08-06 DIAGNOSIS — R7303 Prediabetes: Secondary | ICD-10-CM

## 2018-08-06 DIAGNOSIS — R0789 Other chest pain: Secondary | ICD-10-CM

## 2018-08-06 DIAGNOSIS — C541 Malignant neoplasm of endometrium: Secondary | ICD-10-CM

## 2018-08-06 DIAGNOSIS — I5032 Chronic diastolic (congestive) heart failure: Secondary | ICD-10-CM

## 2018-08-06 NOTE — Progress Notes (Signed)
Cardiology Office Note:    Date:  08/06/2018   ID:  Angela Gardner, DOB 07/26/1960, MRN [id number]  PCP:  Charlott Rakes, MD  Cardiologist:  Jenne Campus, MD    Referring MD: Charlott Rakes, MD   Chief Complaint  Patient presents with  . Follow-up  Am weak and tired  History of Present Illness:    Angela Gardner is a 58 y.o. female with uterine cancer status post chemotherapy and radiation therapy.  Also diastolic congestive heart failure.  Chronic swelling of lower extremities, today to my office for follow-up overall she is cheerful but complaining of being tired and exhausted she actually can stand for a few minutes and then she had to sit down because of fatigue and tiredness.  She does not leave the house much.  She stay at home all the time she likes cooking and she cooks a lot.  Denies have any chest pain tightness squeezing pressure burning chest.  She does have swelling of lower extremities which is worse on the left than on the right I did review vascular studies that were done which showed no evidence of DVT.  Past Medical History:  Diagnosis Date  . Anemia   . 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 CHF (congestive heart failure) ([place])   . Dyspnea    still having this and not moving around alot -gets worse with exertion  . History of sleep walking   . Hx of left breast biopsy   . Hyperlipemia   . Hypertension    not on any medication now-[facility] had  prescribed her a med and she stopped taking them cannot remember when    Past Surgical History:  Procedure Laterality Date  . BREAST BIOPSY     left breast  . CESAREAN SECTION     one  . CHOLECYSTECTOMY    . DILATION AND CURETTAGE OF UTERUS     approx 1 year ago  . IR IMAGING GUIDED PORT INSERTION  12/06/2017  . IR REMOVAL TUN ACCESS W/ PORT W/O FL MOD SED  06/19/2018  . LYMPH NODE BIOPSY N/A 11/14/2017   Procedure: SENTINEL LYMPH NODE BIOPSY;  Surgeon: Everitt Amber, MD;  Location: [facility];  Service:  Gynecology;  Laterality: N/A;  . ROBOTIC ASSISTED TOTAL HYSTERECTOMY WITH BILATERAL SALPINGO OOPHERECTOMY N/A 11/14/2017   Procedure: XI ROBOTIC ASSISTED TOTAL HYSTERECTOMY WITH BILATERAL SALPINGO OOPHORECTOMY, RIGHT PELVIC LYMPHADENECTOMY;  Surgeon: Everitt Amber, MD;  Location: [facility];  Service: Gynecology;  Laterality: N/A;    Current Medications: Current Meds  Medication Sig  . acetaminophen (TYLENOL) 500 MG tablet Take 1,000 mg by mouth every 6 (six) hours as needed.  Marland Kitchen amLODipine (NORVASC) 10 MG tablet Take 1 tablet (10 mg total) by mouth daily.  Marland Kitchen dexamethasone (DECADRON) 4 MG tablet Take 5 tabs at the night before and 5 tab the morning of chemotherapy, every 3 weeks, by mouth  . furosemide (LASIX) 40 MG tablet Take 1 tablet (40 mg total) by mouth 2 (two) times daily. For 1 week then resume 1 tablet daily  . hydrALAZINE (APRESOLINE) 25 MG tablet Take 1 tablet (25 mg total) by mouth 3 (three) times daily.  Marland Kitchen ibuprofen (ADVIL,MOTRIN) 200 MG tablet Take 400 mg by mouth every 6 (six) hours as needed.  . magic mouthwash w/lidocaine SOLN Take 5 mLs by mouth 4 (four) times daily as needed for mouth pain. Swish and spit  . metoprolol tartrate (LOPRESSOR) 25 MG tablet Take 1 tablet (25 mg  total) by mouth 2 (two) times daily.  . polyethylene glycol (MIRALAX / GLYCOLAX) packet Take 17 g by mouth daily as needed for mild constipation.   . senna (SENOKOT) 8.6 MG TABS tablet Take 1 tablet by mouth daily.  . Vitamin D, Ergocalciferol, (DRISDOL) 1.25 MG (50000 UT) CAPS capsule TAKE 1 CAPSULE BY MOUTH EVERY 7 DAYS     Allergies:   Vicodin [hydrocodone-acetaminophen]   Social History   Socioeconomic History  . Marital status: Single    Spouse name: Not on file  . Number of children: 1  . Years of education: Not on file  . Highest education level: 9th grade  Occupational History  . Not on file  Social Needs  . Financial resource strain: Not on file  . Food insecurity:    Worry: Not on file     Inability: Not on file  . Transportation needs:    Medical: No    Non-medical: No  Tobacco Use  . Smoking status: Current Some Day Smoker    Packs/day: 0.30    Years: 26.00    Pack years: 7.80    Types: Cigarettes  . Smokeless tobacco: Never Used  . Tobacco comment: quit May 2019  Substance and Sexual Activity  . Alcohol use: Yes    Comment: social drinking  . Drug use: Yes    Types: Cocaine, "Crack" cocaine    Comment: last time was one  to two weeks ago per patient   . Sexual activity: Not Currently    Birth control/protection: Post-menopausal  Lifestyle  . Physical activity:    Days per week: Not on file    Minutes per session: Not on file  . Stress: Not on file  Relationships  . Social connections:    Talks on phone: Not on file    Gets together: Not on file    Attends religious service: Not on file    Active member of club or organization: Not on file    Attends meetings of clubs or organizations: Not on file    Relationship status: Not on file  Other Topics Concern  . Not on file  Social History Narrative  . Not on file     Family History: The patient's family history includes Anuerysm in her mother; Congestive Heart Failure in her mother; Hypertension in her brother, father, and sister. ROS:   Please see the history of present illness.    All 14 point review of systems negative except as described per history of present illness  EKGs/Labs/Other Studies Reviewed:      Recent Labs: 09/12/2017: B Natriuretic Peptide 258.2 03/29/2018: NT-Pro BNP 468 06/19/2018: Hemoglobin 10.3; Platelets 238 07/03/2018: ALT 11; BUN 23; Creatinine, Ser 1.72; Potassium 3.8; Sodium 145  Recent Lipid Panel    Component Value Date/Time   CHOL 228 (H) 03/30/2009 2039   TRIG 139 03/30/2009 2039   HDL 67 03/30/2009 2039   CHOLHDL 3.4 Ratio 03/30/2009 2039   VLDL 28 03/30/2009 2039   LDLCALC 133 (H) 03/30/2009 2039    Physical Exam:    VS:  BP 140/80   Pulse 80   Ht 5\' 4"   (1.626 m)   Wt 264 lb (119.7 kg)   LMP  (LMP Unknown) Comment: vaginal bleeding continuously for last 8 months  SpO2 97%   BMI 45.32 kg/m     Wt Readings from Last 3 Encounters:  08/06/18 264 lb (119.7 kg)  07/19/18 260 lb (117.9 kg)  07/03/18 259 lb 6.4 oz (  117.7 kg)     GEN:  Well nourished, well developed in no acute distress HEENT: Normal NECK: No JVD; No carotid bruits LYMPHATICS: No lymphadenopathy CARDIAC: RRR, no murmurs, no rubs, no gallops RESPIRATORY:  Clear to auscultation without rales, wheezing or rhonchi  ABDOMEN: Soft, non-tender, non-distended MUSCULOSKELETAL:  No edema; No deformity  SKIN: Warm and dry LOWER EXTREMITIES: no swelling NEUROLOGIC:  Alert and oriented x 3 PSYCHIATRIC:  Normal affect   ASSESSMENT:    1. Chronic diastolic congestive heart failure, NYHA class 3 ([place])   2. Endometrial cancer ([place])   3. Prediabetes   4. Chest discomfort    PLAN:    In order of problems listed above:  1. Chronic diastolic congestive heart failure.  On diuretics I will check Chem-7 as well as proBNP today.  It is time to recheck her echocardiogram which we will do. 2. Endometrial cancer.  Followed by oncology team.  Status post chemotherapy and radiation therapy. 3. Prediabetes we talked about exercises on the regular basis but is so difficult to do for her time having some doubt if she will be able to do that. 4. Chest pain: Denies having any.  Very complex situation does have chronic swelling of lower extremities diastolic dysfunction has been noted will be rechecked again with echocardiogram   Medication Adjustments/Labs and Tests Ordered: Current medicines are reviewed at length with the patient today.  Concerns regarding medicines are outlined above.  No orders of the defined types were placed in this encounter.  Medication changes: No orders of the defined types were placed in this encounter.   Signed, Park Liter, MD, [facility] 08/06/2018 10:27 AM     [place]

## 2018-08-06 NOTE — Addendum Note (Signed)
Addended by: Ashok Norris on: 08/06/2018 10:35 AM   Modules accepted: Orders

## 2018-08-06 NOTE — Patient Instructions (Signed)
Medication Instructions:  Your physician recommends that you continue on your current medications as directed. Please refer to the Current Medication list given to you today.  If you need a refill on your cardiac medications before your next appointment, please call your pharmacy.   Lab work: Your physician recommends that you return for lab work today: bmp, bnp   If you have labs (blood work) drawn today and your tests are completely normal, you will receive your results only by: Marland Kitchen MyChart Message (if you have MyChart) OR . A paper copy in the mail If you have any lab test that is abnormal or we need to change your treatment, we will call you to review the results.  Testing/Procedures: Your physician has requested that you have an echocardiogram. Echocardiography is a painless test that uses sound waves to create images of your heart. It provides your doctor with information about the size and shape of your heart and how well your heart's chambers and valves are working. This procedure takes approximately one hour. There are no restrictions for this procedure.    Follow-Up:  At [facility], you and your health needs are our priority.  As part of our continuing mission to provide you with exceptional heart care, we have created designated Provider Care Teams.  These Care Teams include your primary Cardiologist (physician) and Advanced Practice Providers (APPs -  Physician Assistants and Nurse Practitioners) who all work together to provide you with the care you need, when you need it. You will need a follow up appointment in 4 months.  Please call our office 2 months in advance to schedule this appointment.  You may see Jenne Campus, MD or another member of our [facility] Provider Team in [place]: Shirlee More, MD . Jyl Heinz, MD  Any Other Special Instructions Will Be Listed Below (If Applicable).   Echocardiogram An echocardiogram is a procedure that uses painless sound  waves (ultrasound) to produce an image of the heart. Images from an echocardiogram can provide important information about:  Signs of coronary artery disease (CAD).  Aneurysm detection. An aneurysm is a weak or damaged part of an artery wall that bulges out from the normal force of blood pumping through the body.  Heart size and shape. Changes in the size or shape of the heart can be associated with certain conditions, including heart failure, aneurysm, and CAD.  Heart muscle function.  Heart valve function.  Signs of a past heart attack.  Fluid buildup around the heart.  Thickening of the heart muscle.  A tumor or infectious growth around the heart valves. Tell a health care provider about:  Any allergies you have.  All medicines you are taking, including vitamins, herbs, eye drops, creams, and over-the-counter medicines.  Any blood disorders you have.  Any surgeries you have had.  Any medical conditions you have.  Whether you are pregnant or may be pregnant. What are the risks? Generally, this is a safe procedure. However, problems may occur, including:  Allergic reaction to dye (contrast) that may be used during the procedure. What happens before the procedure? No specific preparation is needed. You may eat and drink normally. What happens during the procedure?   An IV tube may be inserted into one of your veins.  You may receive contrast through this tube. A contrast is an injection that improves the quality of the pictures from your heart.  A gel will be applied to your chest.  A wand-like tool (transducer) will  be moved over your chest. The gel will help to transmit the sound waves from the transducer.  The sound waves will harmlessly bounce off of your heart to allow the heart images to be captured in real-time motion. The images will be recorded on a computer. The procedure may vary among health care providers and hospitals. What happens after the  procedure?  You may return to your normal, everyday life, including diet, activities, and medicines, unless your health care provider tells you not to do that. Summary  An echocardiogram is a procedure that uses painless sound waves (ultrasound) to produce an image of the heart.  Images from an echocardiogram can provide important information about the size and shape of your heart, heart muscle function, heart valve function, and fluid buildup around your heart.  You do not need to do anything to prepare before this procedure. You may eat and drink normally.  After the echocardiogram is completed, you may return to your normal, everyday life, unless your health care provider tells you not to do that. This information is not intended to replace advice given to you by your health care provider. Make sure you discuss any questions you have with your health care provider. Document Released: 06/03/2000 Document Revised: 07/09/2016 Document Reviewed: 07/09/2016 Elsevier Interactive Patient Education  2019 [employer].

## 2018-08-07 LAB — BASIC METABOLIC PANEL
BUN/Creatinine Ratio: 17 (ref 9–23)
BUN: 25 mg/dL — ABNORMAL HIGH (ref 6–24)
CO2: 23 mmol/L (ref 20–29)
Calcium: 9.5 mg/dL (ref 8.7–10.2)
Chloride: 101 mmol/L (ref 96–106)
Creatinine, Ser: 1.51 mg/dL — ABNORMAL HIGH (ref 0.57–1.00)
GFR calc Af Amer: 44 mL/min/{1.73_m2} — ABNORMAL LOW (ref >59)
GFR calc non Af Amer: 38 mL/min/{1.73_m2} — ABNORMAL LOW (ref >59)
GLUCOSE: 101 mg/dL — AB (ref 65–99)
POTASSIUM: 3.8 mmol/L (ref 3.5–5.2)
Sodium: 140 mmol/L (ref 134–144)

## 2018-08-07 LAB — PRO B NATRIURETIC PEPTIDE: NT-PRO BNP: 140 pg/mL (ref 0–287)

## 2018-08-09 ENCOUNTER — Ambulatory Visit (HOSPITAL_BASED_OUTPATIENT_CLINIC_OR_DEPARTMENT_OTHER)
Admission: RE | Admit: 2018-08-09 | Discharge: 2018-08-09 | Disposition: A | Discharge status: Home / Self Care | Payer: Medicaid Other | Source: Ambulatory Visit | Attending: Cardiology | Admitting: Cardiology

## 2018-08-09 DIAGNOSIS — R0789 Other chest pain: Secondary | ICD-10-CM

## 2018-08-09 DIAGNOSIS — I5032 Chronic diastolic (congestive) heart failure: Secondary | ICD-10-CM | POA: Diagnosis present

## 2018-08-09 LAB — FECAL OCCULT BLOOD, IMMUNOCHEMICAL: FECAL OCCULT BLD: NEGATIVE

## 2018-08-15 ENCOUNTER — Encounter (HOSPITAL_COMMUNITY): Payer: Self-pay | Admitting: *Deleted

## 2018-08-15 NOTE — Progress Notes (Signed)
Letter mailed to patient with negative Fit Test results.  

## 2018-08-30 MED FILL — METOPROLOL TARTRATE 25 MG T: 25 | 30 days supply | Qty: 60 | Fill #3

## 2018-08-30 MED FILL — AMLODIPINE BESYLATE 10 MG T: 10 | 30 days supply | Qty: 30 | Fill #2

## 2018-09-05 ENCOUNTER — Telehealth: Payer: Self-pay | Admitting: *Deleted

## 2018-09-05 NOTE — Telephone Encounter (Signed)
CALLED PATIENT TO ASK ABOUT RESCHEDULING FU FOR 09-10-18, DUE TO DR. KINARD BEING IN THE OR, RESCHEDULED FOR 09-20-18 @ 9:30 AM , PATIENT AGREED TO NEW TIME AND DATE

## 2018-09-10 ENCOUNTER — Ambulatory Visit: Payer: Medicaid Other | Admitting: Radiation Oncology

## 2018-09-11 ENCOUNTER — Ambulatory Visit: Payer: Medicaid Other | Admitting: Nurse Practitioner

## 2018-09-15 MED FILL — hydrALAZINE HCL 25 MG TABS: 25 | 30 days supply | Qty: 90 | Fill #3

## 2018-09-15 MED FILL — FUROSEMIDE 40 MG TAB: 40 | 45 days supply | Qty: 45 | Fill #3

## 2018-09-20 ENCOUNTER — Other Ambulatory Visit: Payer: Self-pay

## 2018-09-20 ENCOUNTER — Ambulatory Visit
Admission: RE | Admit: 2018-09-20 | Discharge: 2018-09-20 | Disposition: A | Discharge status: Home / Self Care | Payer: Medicaid Other | Source: Ambulatory Visit | Attending: Radiation Oncology | Admitting: Radiation Oncology

## 2018-09-20 ENCOUNTER — Encounter: Payer: Self-pay | Admitting: Radiation Oncology

## 2018-09-20 VITALS — BP 164/88 | HR 75 | Temp 98.0°F | Resp 18 | Ht 64.0 in | Wt 259.0 lb

## 2018-09-20 DIAGNOSIS — Z9221 Personal history of antineoplastic chemotherapy: Secondary | ICD-10-CM | POA: Diagnosis not present

## 2018-09-20 DIAGNOSIS — G8929 Other chronic pain: Secondary | ICD-10-CM | POA: Diagnosis not present

## 2018-09-20 DIAGNOSIS — Z8542 Personal history of malignant neoplasm of other parts of uterus: Secondary | ICD-10-CM | POA: Insufficient documentation

## 2018-09-20 DIAGNOSIS — Z79899 Other long term (current) drug therapy: Secondary | ICD-10-CM | POA: Diagnosis not present

## 2018-09-20 DIAGNOSIS — Z923 Personal history of irradiation: Secondary | ICD-10-CM | POA: Diagnosis not present

## 2018-09-20 DIAGNOSIS — C541 Malignant neoplasm of endometrium: Secondary | ICD-10-CM

## 2018-09-20 DIAGNOSIS — M79605 Pain in left leg: Secondary | ICD-10-CM | POA: Diagnosis not present

## 2018-09-20 NOTE — Progress Notes (Signed)
Radiation Oncology         [phone] ________________________________  Name: Angela Gardner MRN: [id number]  Date: 09/20/2018  DOB: 1960/09/06  Follow-Up Visit Note  CC: Charlott Rakes, MD  Everitt Amber, MD    ICD-10-CM   1. Endometrial cancer (HCC) C54.1     Diagnosis:   58 y.o. 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  Since her last follow-up, she completed her chemotherapy on 05/03/2018. She last saw Dr. Denman George in December 2019 with no evidence of disease on exam. She underwent restaging CT Chest/Abdomen/Pelvis scans on 05/28/2018 which also did not show any evidence of cancer. There were two upper normal sized right pelvic lymph nodes but not overtly pathologically enlarged. One measured 0.9 cm, formerly 0.5 on July CT, and the other measured 0.8 cm, formerly 0.9 cm. Exam otherwise unremarkable.  On review of systems, the patient reports pain along the left side of her body, especially the left leg (chronic pain). She denies dysuria or hematuria. She denies vaginal bleeding or discharge. She denies rectal bleeding, diarrhea, or constipation. She denies abdominal bloating, nausea, or vomiting.  ALLERGIES:  is allergic to vicodin [hydrocodone-acetaminophen].  Meds: Current Outpatient Medications  Medication Sig Dispense Refill  . acetaminophen (TYLENOL) 500 MG tablet Take 1,000 mg by mouth every 6 (six) hours as needed.    Marland Kitchen amLODipine (NORVASC) 10 MG tablet Take 1 tablet (10 mg total) by mouth daily. 30 tablet 6  . furosemide (LASIX) 40 MG tablet Take 1 tablet (40 mg total) by mouth 2 (two) times daily. For 1 week then resume 1 tablet daily 45 tablet 3  . hydrALAZINE (APRESOLINE) 25 MG tablet Take 1 tablet (25 mg total) by  mouth 3 (three) times daily. 90 tablet 6  . ibuprofen (ADVIL,MOTRIN) 200 MG tablet Take 400 mg by mouth every 6 (six) hours as needed.    . magic mouthwash w/lidocaine SOLN Take 5 mLs by mouth 4 (four) times daily as needed for mouth pain. Swish and spit 140 mL 0  . metoprolol tartrate (LOPRESSOR) 25 MG tablet Take 1 tablet (25 mg total) by mouth 2 (two) times daily. 60 tablet 6  . Vitamin D, Ergocalciferol, (DRISDOL) 1.25 MG (50000 UT) CAPS capsule TAKE 1 CAPSULE BY MOUTH EVERY 7 DAYS 12 capsule 0  . dexamethasone (DECADRON) 4 MG tablet Take 5 tabs at the night before and 5 tab the morning of chemotherapy, every 3 weeks, by mouth (Patient not taking: Reported on 09/20/2018) 60 tablet 0  . polyethylene glycol (MIRALAX / GLYCOLAX) packet Take 17 g by mouth daily as needed for mild constipation.     . senna (SENOKOT) 8.6 MG TABS tablet Take 1 tablet by mouth daily.     No current facility-administered medications for this encounter.     Physical Findings: The patient is in no acute distress. Patient is alert and oriented.  height is 5\' 4"  (1.626 m) and weight is 259 lb (117.5 kg). Her oral temperature is 98 F (36.7 C). Her blood pressure is 164/88 (abnormal) and her pulse is 75. Her respiration is 18 and oxygen saturation is 100%.   Lungs are clear to auscultation bilaterally. Heart has regular rate and rhythm. No palpable cervical, supraclavicular, or  axillary adenopathy. Abdomen soft, non-tender, normal bowel sounds.  On pelvic examination the external genitalia were unremarkable. A speculum exam was performed. Initially had difficulty seeing the cuff in light of the elongated vaginal vault. With the long speculum, was able to get a good view of the vaginal cuff and saw no mucosal lesions. There are no mucosal lesions noted in the vaginal vault. On bimanual examination there were no pelvic masses appreciated.   Lab Findings: Lab Results  Component Value Date   WBC 3.4 (L) 06/19/2018   HGB 10.3  (L) 06/19/2018   HCT 32.7 (L) 06/19/2018   MCV 99.1 06/19/2018   PLT 238 06/19/2018    Radiographic Findings: No results found.  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 No evidence of recurrence on clinical exam. Recommended the patient start using her vaginal dilator again.  Plan:  Patient will see Dr. Denman George in 3 months and follow up in radiation oncology in 6 months.  ____________________________________  Blair Promise, PhD, MD  This document serves as a record of services personally performed by Gery Pray, MD. It was created on his behalf by Rae Lips, a trained medical scribe. The creation of this record is based on the scribe's personal observations and the provider's statements to them. This document has been checked and approved by the attending provider.

## 2018-09-20 NOTE — Progress Notes (Signed)
Pt presents today for f/u with Dr. Sondra Come. Pt reports pain along left side of body, especially left leg. Pt denies dysuria/hematuria. Pt denies vaginal bleeding/discharge. Pt denies rectal bleeding, diarrhea/constipation. Pt denies abdominal bloating, N/V.  BP (!) 164/88 (BP Location: Left Arm, Patient Position: Sitting)   Pulse 75   Temp 98 F (36.7 C) (Oral)   Resp 18   Ht 5\' 4"  (1.626 m)   Wt 259 lb (117.5 kg)   LMP  (LMP Unknown) Comment: vaginal bleeding continuously for last 8 months  SpO2 100%   BMI 44.46 kg/m   Wt Readings from Last 3 Encounters:  09/20/18 259 lb (117.5 kg)  08/06/18 264 lb (119.7 kg)  07/19/18 260 lb (117.9 kg)   Loma Sousa, RN BSN

## 2018-09-20 NOTE — Patient Instructions (Signed)
Coronavirus (COVID-19) Are you at risk?  Are you at risk for the Coronavirus (COVID-19)?  To be considered HIGH RISK for Coronavirus (COVID-19), you have to meet the following criteria:  . Traveled to [place], [place], [place], [place] or [place]; or in the [place] to [place], [place], [place], or [place]; and have fever, cough, and shortness of breath within the last 2 weeks of travel OR . Been in close contact with a person diagnosed with COVID-19 within the last 2 weeks and have fever, cough, and shortness of breath . IF YOU DO NOT MEET THESE CRITERIA, YOU ARE CONSIDERED LOW RISK FOR COVID-19.  What to do if you are HIGH RISK for COVID-19?  . If you are having a medical emergency, call 911. . Seek medical care right away. Before you go to a doctor's office, urgent care or emergency department, call ahead and tell them about your recent travel, contact with someone diagnosed with COVID-19, and your symptoms. You should receive instructions from your physician's office regarding next steps of care.  . When you arrive at healthcare provider, tell the healthcare staff immediately you have returned from visiting [place], [place], [place], [place] or [place]; or traveled in the [place] to [place], [place], [place], or [place]; in the last two weeks or you have been in close contact with a person diagnosed with COVID-19 in the last 2 weeks.   . Tell the health care staff about your symptoms: fever, cough and shortness of breath. . After you have been seen by a medical provider, you will be either: o Tested for (COVID-19) and discharged home on quarantine except to seek medical care if symptoms worsen, and asked to  - Stay home and avoid contact with others until you get your results (4-5 days)  - Avoid travel on public transportation if possible (such as bus, train, or airplane) or o Sent to the Emergency Department by EMS for evaluation, COVID-19 testing, and possible  admission depending on your condition and test results.  What to do if you are LOW RISK for COVID-19?  Reduce your risk of any infection by using the same precautions used for avoiding the common cold or flu:  . Wash your hands often with soap and warm water for at least 20 seconds.  If soap and water are not readily available, use an alcohol-based hand sanitizer with at least 60% alcohol.  . If coughing or sneezing, cover your mouth and nose by coughing or sneezing into the elbow areas of your shirt or coat, into a tissue or into your sleeve (not your hands). . Avoid shaking hands with others and consider head nods or verbal greetings only. . Avoid touching your eyes, nose, or mouth with unwashed hands.  . Avoid close contact with people who are sick. . Avoid places or events with large numbers of people in one location, like concerts or sporting events. . Carefully consider travel plans you have or are making. . If you are planning any travel outside or inside the [place], visit the CDC's Travelers' Health webpage for the latest health notices. . If you have some symptoms but not all symptoms, continue to monitor at home and seek medical attention if your symptoms worsen. . If you are having a medical emergency, call 911.   ADDITIONAL HEALTHCARE OPTIONS FOR PATIENTS  [facility] / e-Visit: [email or web address]         [facility]: [phone]  [facility]: [phone]                   [facility]: [phone]   

## 2018-10-08 ENCOUNTER — Other Ambulatory Visit: Payer: Self-pay

## 2018-10-08 ENCOUNTER — Ambulatory Visit: Payer: Medicaid Other | Attending: Family Medicine | Admitting: Family Medicine

## 2018-10-08 ENCOUNTER — Encounter: Payer: Self-pay | Admitting: Family Medicine

## 2018-10-08 DIAGNOSIS — C541 Malignant neoplasm of endometrium: Secondary | ICD-10-CM

## 2018-10-08 DIAGNOSIS — I10 Essential (primary) hypertension: Secondary | ICD-10-CM

## 2018-10-08 DIAGNOSIS — I5032 Chronic diastolic (congestive) heart failure: Secondary | ICD-10-CM

## 2018-10-08 MED ORDER — HYDRALAZINE HCL 25 MG PO TABS: 25.0000 mg | ORAL_TABLET | Freq: Three times a day (TID) | ORAL | 1 refills | Status: DC

## 2018-10-08 MED ORDER — FUROSEMIDE 40 MG PO TABS: 40.0000 mg | ORAL_TABLET | Freq: Two times a day (BID) | ORAL | 1 refills | Status: DC

## 2018-10-08 MED ORDER — AMLODIPINE BESYLATE 10 MG PO TABS: 10.0000 mg | ORAL_TABLET | Freq: Every day | ORAL | 1 refills | Status: DC

## 2018-10-08 MED ORDER — METOPROLOL TARTRATE 25 MG PO TABS: 25.0000 mg | ORAL_TABLET | Freq: Two times a day (BID) | ORAL | 1 refills | Status: DC

## 2018-10-08 NOTE — Progress Notes (Addendum)
Virtual Visit via Telephone Note  I connected with [name], on 10/08/2018 at 8:50 AM by telephone and verified that I am speaking with the correct person using two identifiers.   Consent: I discussed the limitations, risks, security and privacy concerns of performing an evaluation and management service by telephone and the availability of in person appointments. I also discussed with the patient that there may be a patient responsible charge related to this service. The patient expressed understanding and agreed to proceed.   Location of Patient: Patient's home  Location of Provider: Clinic  Persons participating in Telemedicine visit: Kailyn Vanderslice Farrington-CMA Dr. Margarita Rana -Physician    History of Present Illness: Angela Gardner  is a 58 year old female with a history of hypertension, stage III chronic kidney disease, diastolic CHF (EF 60 to 14% from 07/2018), Endometrial carcinoma diagnosed in 09/2017 s/p total abdominal hysterectomy and bilateral salpingo-oophorectomy on 11/14/2017, status post radiation and chemotherapy who presents today for follow-up visit.  She reports feeling well and has no chest pains, shortness of breath or pedal edema, orthopnea.Last visit with cardiology was in 07/2018 with no regimen changes made. She denies abdominal pain or uterine bleed. Her last visit with her radiation oncologist, Dr. Luretha Rued was on 09/19/2018 and she last saw her GYN Dr. Everitt Amber in 05/2018 and visit to Medical Oncology was on 05/2018.  Her port has been removed. CT abdomen and pelvis with contrast from 05/28/2018: IMPRESSION: 1. Two upper normal sized right pelvic lymph nodes are observed but are not overtly pathologically enlarged. One of these is actually slightly smaller than on 12/22/2017. 2.  Aortic Atherosclerosis (ICD10-I70.0). 3. Degenerative disc disease at L4-5. 4. Mild cardiomegaly.   She has no concerns today and is requesting a refill of  amlodipine.  Observations/Objective: Awake, alert, oriented x3 Not in acute distress  CMP Latest Ref Rng & Units 08/06/2018 07/03/2018 06/19/2018  Glucose 65 - 99 mg/dL 101(H) 105(H) 103(H)  BUN 6 - 24 mg/dL 25(H) 23 23(H)  Creatinine 0.57 - 1.00 mg/dL 1.51(H) 1.72(H) 1.50(H)  Sodium 134 - 144 mmol/L 140 145(H) 141  Potassium 3.5 - 5.2 mmol/L 3.8 3.8 3.3(L)  Chloride 96 - 106 mmol/L 101 107(H) 105  CO2 20 - 29 mmol/L 23 23 26   Calcium 8.7 - 10.2 mg/dL 9.5 9.7 9.4  Total Protein 6.0 - 8.5 g/dL - 6.9 -  Total Bilirubin 0.0 - 1.2 mg/dL - 0.3 -  Alkaline Phos 39 - 117 IU/L - 94 -  AST 0 - 40 IU/L - 15 -  ALT 0 - 32 IU/L - 11 -    Assessment and Plan: 1. Essential hypertension Advised to keep blood pressure log at home Continue current regimen Counseled on blood pressure goal of less than 130/80, low-sodium, DASH diet, medication compliance, 150 minutes of moderate intensity exercise per week. Discussed medication compliance, adverse effects. - amLODipine (NORVASC) 10 MG tablet; Take 1 tablet (10 mg total) by mouth daily.  Dispense: 90 tablet; Refill: 1 - hydrALAZINE (APRESOLINE) 25 MG tablet; Take 1 tablet (25 mg total) by mouth 3 (three) times daily.  Dispense: 270 tablet; Refill: 1 - metoprolol tartrate (LOPRESSOR) 25 MG tablet; Take 1 tablet (25 mg total) by mouth 2 (two) times daily.  Dispense: 90 tablet; Refill: 1  2. Chronic diastolic congestive heart failure, NYHA class 3 (HCC) Euvolemic EF 60 to 65% from 07/2018 Heart healthy diet, daily weights, fluid restriction. - furosemide (LASIX) 40 MG tablet; Take 1 tablet (40 mg total) by mouth  2 (two) times daily. For 1 week then resume 1 tablet daily  Dispense: 180 tablet; Refill: 1  3. Endometrial cancer [facility]) s/p total abdominal hysterectomy and bilateral salpingo-oophorectomy on 11/14/2017, status post radiation and chemotherapy  No evidence of cancer from most recent imaging   Follow Up Instructions: Return in about 3 months  (around 01/07/2019).    I discussed the assessment and treatment plan with the patient. The patient was provided an opportunity to ask questions and all were answered. The patient agreed with the plan and demonstrated an understanding of the instructions.   The patient was advised to call back or seek an in-person evaluation if the symptoms worsen or if the condition fails to improve as anticipated.     I provided 20 minutes total of non-face-to-face time during this encounter including median intraservice time, reviewing previous notes, labs, imaging, medications and explaining diagnosis and management.     Charlott Rakes, MD, FAAFP. [facility], [place]   10/08/2018, 8:50 AM

## 2018-10-08 NOTE — Progress Notes (Signed)
Patient has been called and DOB has been verified. Patient has been screened and transferred to PCP to start phone visit.  C/C: hypertention  Refills: all meds.

## 2018-10-13 MED FILL — FUROSEMIDE 40 MG TAB: 40 | 83 days supply | Qty: 90 | Fill #0

## 2018-10-13 MED FILL — METOPROLOL TARTRATE 25 MG T: 25 | 90 days supply | Qty: 180 | Fill #0

## 2018-10-13 MED FILL — AMLODIPINE BESYLATE 10 MG T: 10 | 90 days supply | Qty: 90 | Fill #0

## 2018-10-13 MED FILL — hydrALAZINE HCL 25 MG TABS: 25 | 90 days supply | Qty: 270 | Fill #0

## 2018-11-07 ENCOUNTER — Encounter: Payer: Self-pay | Admitting: Nurse Practitioner

## 2018-11-07 ENCOUNTER — Other Ambulatory Visit: Payer: Self-pay

## 2018-11-07 ENCOUNTER — Ambulatory Visit (INDEPENDENT_AMBULATORY_CARE_PROVIDER_SITE_OTHER): Payer: Medicaid Other | Admitting: Nurse Practitioner

## 2018-11-07 DIAGNOSIS — Z0000 Encounter for general adult medical examination without abnormal findings: Secondary | ICD-10-CM

## 2018-11-07 HISTORY — DX: Encounter for general adult medical examination without abnormal findings: Z00.00

## 2018-11-07 NOTE — Assessment & Plan Note (Signed)
The patient is due for her first ever colonoscopy.  She is generally asymptomatic from a GI standpoint.  She does have some anxiety about her procedure but after explaining and totality she felt more comfortable with it.  At this point we will proceed with colonoscopy for CRC screening.  Proceed with colonoscopy on propofol/MAC with Dr. Oneida Alar in the near future. The risks, benefits, and alternatives have been discussed in detail with the patient. They state understanding and desire to proceed.   The patient is not on any anticoagulants, anxiolytics, chronic pain medications, or antidepressants.  She does have a history of crack cocaine use and notes last use was about 1 year ago.  I told her she would have to complete a urine drug screen and she is okay with this.  I also discussed with her the possibility of COVID-19/coronavirus screening prior to her procedure in 2 to 3-day quarantine between the screening and her procedure.  She verbalized understanding.  At this point we will proceed with propofol/MAC to promote adequate sedation.

## 2018-11-07 NOTE — Patient Instructions (Signed)
Your health issues we discussed today were:   Need for colonoscopy: 1. We will schedule your colonoscopy for you 2. You will need to have labs prior to your colonoscopy, including a coronavirus/COVID-19 screening 3. You may be required to stay at home for 2 to 3 days prior to your procedure after your virus screening 4. Further recommendations will be made after your colonoscopy  Overall I recommend:  1. Continue your current medications 2. Call us if you have any questions or concerns 3. Follow-up based on recommendations made after your colonoscopy, or as needed.   Because of recent events of COVID-19 ("Coronavirus"), follow CDC recommendations:  1. Wash your hand frequently 2. Avoid touching your face 3. Stay away from people who are sick 4. If you have symptoms such as fever, cough, shortness of breath then call your healthcare provider for further guidance 5. If you are sick, STAY AT HOME unless otherwise directed by your healthcare provider. 6. Follow directions from state and national officials regarding staying safe   At [facility] we value your feedback. You may receive a survey about your visit today. Please share your experience as we strive to create trusting relationships with our patients to provide genuine, compassionate, quality care.  We appreciate your understanding and patience as we review any laboratory studies, imaging, and other diagnostic tests that are ordered as we care for you. Our office policy is 5 business days for review of these results, and any emergent or urgent results are addressed in a timely manner for your best interest. If you do not hear from our office in 1 week, please contact us.   We also encourage the use of MyChart, which contains your medical information for your review as well. If you are not enrolled in this feature, an access code is on this after visit summary for your convenience. Thank you for allowing [place] to be  involved in your care.  It was great to see you today!  I hope you have a great day!!

## 2018-11-07 NOTE — Progress Notes (Signed)
Referring Provider: Charlott Rakes, MD Primary Care Physician:  Charlott Rakes, MD Primary GI:  Dr. Oneida Alar  NOTE: Service was provided via telemedicine and was requested by the patient due to COVID-19 pandemic.  Method of visit: Doxy.Me  Patient Location: Home  Provider Location: Office  Reason for Phone Visit: PCP Referral  The patient was consented to phone follow-up via telephone encounter including billing of the encounter (yes/no): Yes  Persons present on the phone encounter, with roles: Sister  Total time (minutes) spent on medical discussion: 22 minutes  Chief Complaint  Patient presents with  . Consult    TCS. Never had done prior. No FH colon cancer/polyps     HPI:   Angela Gardner is a 58 y.o. female who presents for virtual visit regarding: Scheduling a colonoscopy on referral from PCP.  Phone/menstrual history for the office visit due to BMI likely necessitating augmented sedation/MAC.  Reviewed information provided with the referral including [facility] dated [facility]  No acute concerns that day.  History of endometrial cancer status post TAH, BSO and completion of radiation/chemotherapy.  CHF with EF 55% and euvolemic.  Had never had colon cancer screening and recommended referral to GI.  Also recommended hepatitis C screening based on CDC recommendations which was completed and resulted as negative.  BMI at cardiology visit 08/06/2018 calculated at 45.3.  No history of colonoscopy in our system.  Today she states she's doing well. Never had a colonoscopy before. Denies abdominal pain, N/V, hematochezia, melena, fever, chills, unintentional weight loss. Denies URI or flu-like symptoms. Denies loss of taste or smell. Denies chest pain, dyspnea, dizziness, lightheadedness, syncope, near syncope. Denies any other upper or lower GI symptoms.  History of "Crack" use, last use over a year ago.  Subjective  height: 5'4", weight: 259 lb; BMI: 44.5  Past Medical History:  Diagnosis Date  . Anemia   . 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 CHF (congestive heart failure) ([place])   . Dyspnea    still having this and not moving around alot -gets worse with exertion  . History of sleep walking   . Hx of left breast biopsy   . Hyperlipemia   . Hypertension    not on any medication now-[facility] had  prescribed her a med and she stopped taking them cannot remember when    Past Surgical History:  Procedure Laterality Date  . BREAST BIOPSY     left breast  . CESAREAN SECTION     one  . CHOLECYSTECTOMY    . DILATION AND CURETTAGE OF UTERUS     approx 1 year ago  . IR IMAGING GUIDED PORT INSERTION  12/06/2017  . IR REMOVAL TUN ACCESS W/ PORT W/O FL MOD SED  06/19/2018  . LYMPH NODE BIOPSY N/A 11/14/2017   Procedure: SENTINEL LYMPH NODE BIOPSY;  Surgeon: Everitt Amber, MD;  Location: [facility];  Service: Gynecology;  Laterality: N/A;  . ROBOTIC ASSISTED TOTAL HYSTERECTOMY WITH BILATERAL SALPINGO OOPHERECTOMY N/A 11/14/2017   Procedure: XI ROBOTIC ASSISTED TOTAL HYSTERECTOMY WITH BILATERAL SALPINGO OOPHORECTOMY, RIGHT PELVIC LYMPHADENECTOMY;  Surgeon: Everitt Amber, MD;  Location: [facility];  Service: Gynecology;  Laterality: N/A;    Current Outpatient Medications  Medication Sig Dispense Refill  . acetaminophen (TYLENOL) 500 MG tablet Take 1,000 mg by mouth every 6 (six) hours as needed.    Marland Kitchen amLODipine (NORVASC) 10 MG tablet Take 1 tablet (10 mg total) by  mouth daily. 90 tablet 1  . furosemide (LASIX) 40 MG tablet Take 1 tablet (40 mg total) by mouth 2 (two) times daily. For 1 week then resume 1 tablet daily 180 tablet 1  . hydrALAZINE (APRESOLINE) 25 MG tablet Take 1 tablet (25 mg total) by mouth 3 (three) times daily. 270 tablet 1  . ibuprofen (ADVIL,MOTRIN) 200 MG tablet Take 400 mg by mouth every 6 (six) hours as needed.    . magic mouthwash w/lidocaine SOLN Take 5 mLs by mouth 4 (four) times daily  as needed for mouth pain. Swish and spit 140 mL 0  . metoprolol tartrate (LOPRESSOR) 25 MG tablet Take 1 tablet (25 mg total) by mouth 2 (two) times daily. 90 tablet 1  . polyethylene glycol (MIRALAX / GLYCOLAX) packet Take 17 g by mouth daily as needed for mild constipation.     . Vitamin D, Ergocalciferol, (DRISDOL) 1.25 MG (50000 UT) CAPS capsule TAKE 1 CAPSULE BY MOUTH EVERY 7 DAYS 12 capsule 0   No current facility-administered medications for this visit.     Allergies as of 11/07/2018 - Review Complete 11/07/2018  Allergen Reaction Noted  . Vicodin [hydrocodone-acetaminophen] Itching 11/23/2011    Family History  Problem Relation Age of Onset  . Anuerysm Mother   . Congestive Heart Failure Mother   . Hypertension Father   . Hypertension Sister   . Hypertension Brother   . Colon cancer Neg Hx     Social History   Socioeconomic History  . Marital status: Single    Spouse name: Not on file  . Number of children: 1  . Years of education: Not on file  . Highest education level: 9th grade  Occupational History  . Not on file  Social Needs  . Financial resource strain: Not on file  . Food insecurity:    Worry: Not on file    Inability: Not on file  . Transportation needs:    Medical: No    Non-medical: No  Tobacco Use  . Smoking status: Former Smoker    Packs/day: 0.30    Years: 26.00    Pack years: 7.80    Types: Cigarettes  . Smokeless tobacco: Never Used  . Tobacco comment: quit May 2019  Substance and Sexual Activity  . Alcohol use: Yes    Comment: social drinking  . Drug use: Yes    Types: Cocaine, "Crack" cocaine    Comment: last time used was over year  . Sexual activity: Not Currently    Birth control/protection: Post-menopausal  Lifestyle  . Physical activity:    Days per week: Not on file    Minutes per session: Not on file  . Stress: Not on file  Relationships  . Social connections:    Talks on phone: Not on file    Gets together: Not on file     Attends religious service: Not on file    Active member of club or organization: Not on file    Attends meetings of clubs or organizations: Not on file    Relationship status: Not on file  Other Topics Concern  . Not on file  Social History Narrative  . Not on file    Review of Systems: Complete ROS negative except as per HPI.  Physical Exam: Note: limited exam due to virtual visit General:   Alert and oriented. Pleasant and cooperative. Well-nourished and well-developed.  Head:  Normocephalic and atraumatic. Eyes:  Without icterus, sclera clear and conjunctiva pink.  Ears:  Normal auditory acuity. Skin:  Intact without facial significant lesions or rashes. Neurologic:  Alert and oriented x4;  grossly normal neurologically. Psych:  Alert and cooperative. Normal mood and affect. Heme/Lymph/Immune: No excessive bruising noted.

## 2018-11-09 IMAGING — CT CT PELVIS W/ CM
2 of 4 series · 14 of 46 positions shown, 16 images · IV contrast (iopamidol)
Comparison: Prior CT scan of the abdomen and pelvis 03/18/2009

CLINICAL DATA: 56-year-old female with a history of endometrial
cancer status post hysterectomy in Wednesday October, 2017. Current complaint of
clear thin nearly continuous discharge. Evaluate for fistula.

EXAM:
CT PELVIS WITH CONTRAST
TECHNIQUE: Multidetector CT imaging of the pelvis was performed using the
standard protocol following the bolus administration of intravenous
contrast.
CONTRAST:  100mL [id number] IOPAMIDOL (TWD9QV-F99) INJECTION 61%,
30mL [id number] IOPAMIDOL (TWD9QV-F99) INJECTION 61%

[Series 3: pelvis 3.0 mpr cor · coronal · 0.49mm/px · 3 of 97 slices shown]
[im 33/97  soft-tissue]
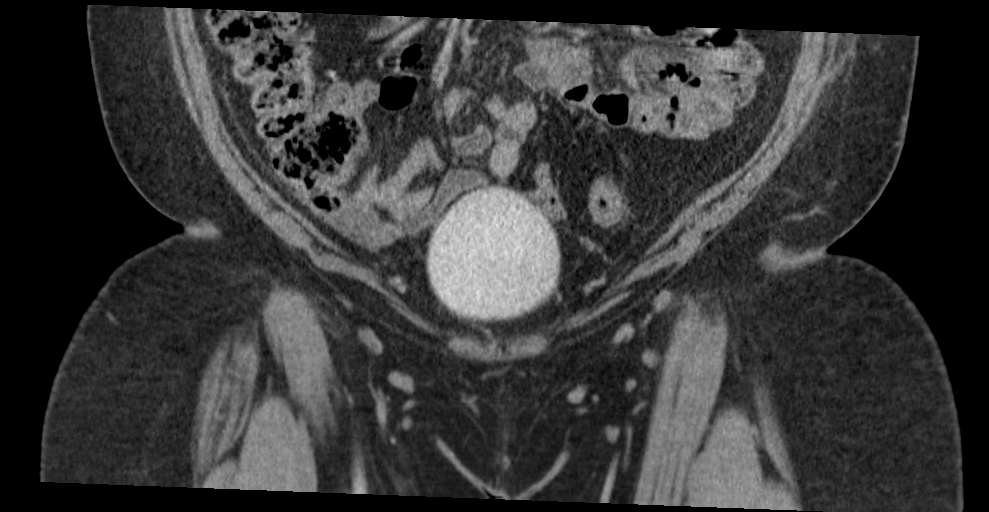
[im 43/97  soft-tissue]
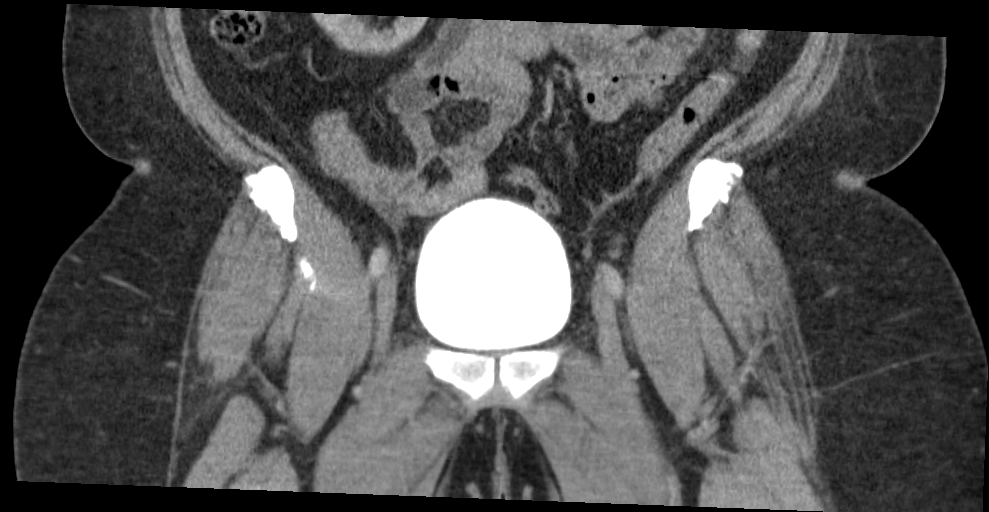
[im 54/97  soft-tissue]
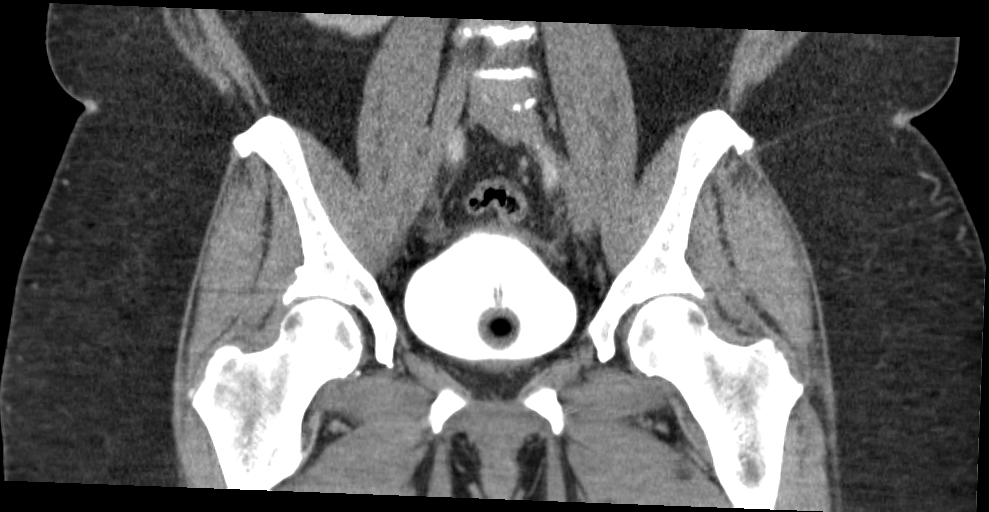

[Series 5: pelvis 3.0 b31f · axial · 0.77mm/px · z∈[-236,-26]mm · 11 of 78 slices shown, 13 images]
[im 4/78  soft-tissue]
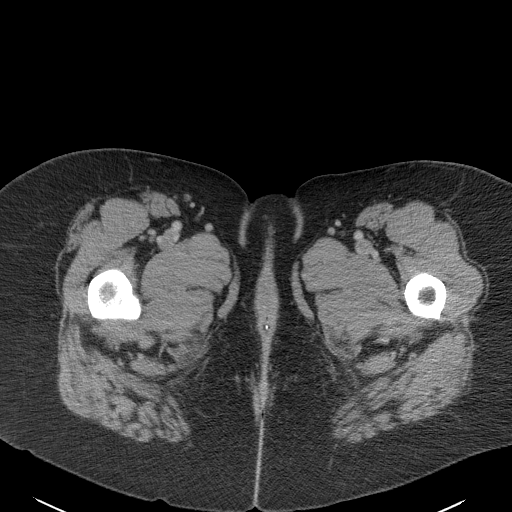
[im 4/78  bone]
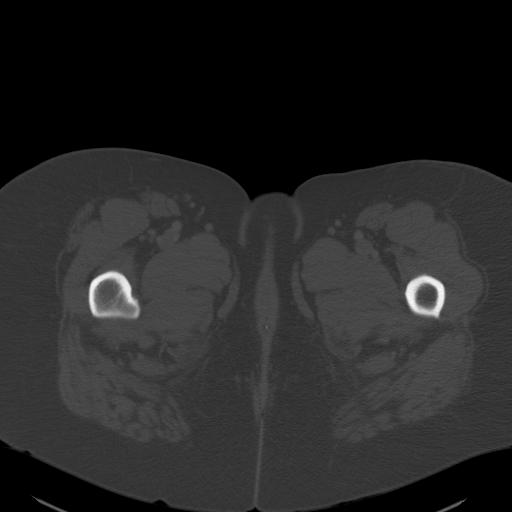
[im 11/78  soft-tissue]
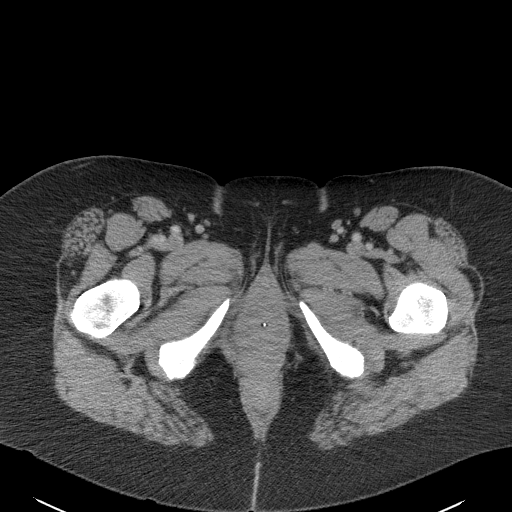
[im 18/78  soft-tissue]
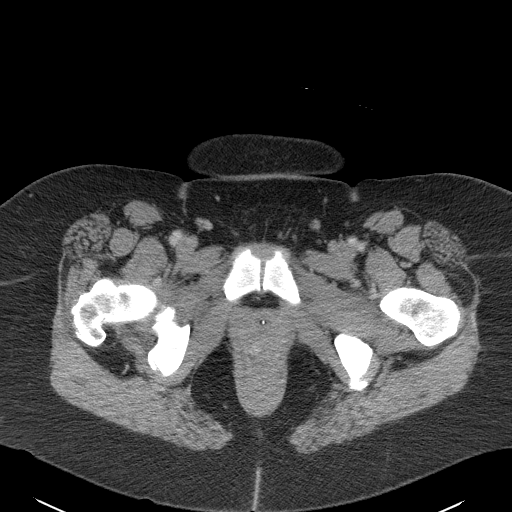
[im 25/78  soft-tissue]
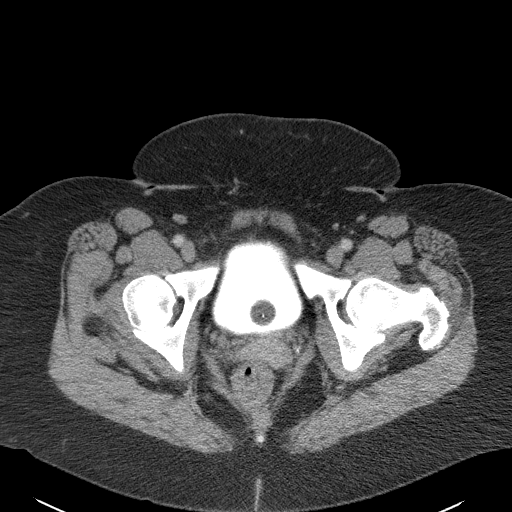
[im 32/78  soft-tissue]
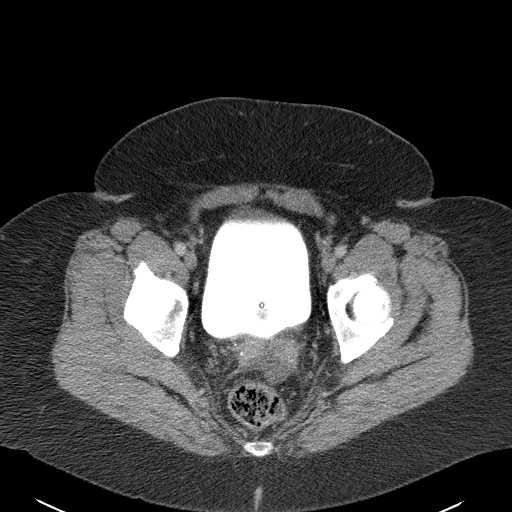
[im 39/78  soft-tissue]
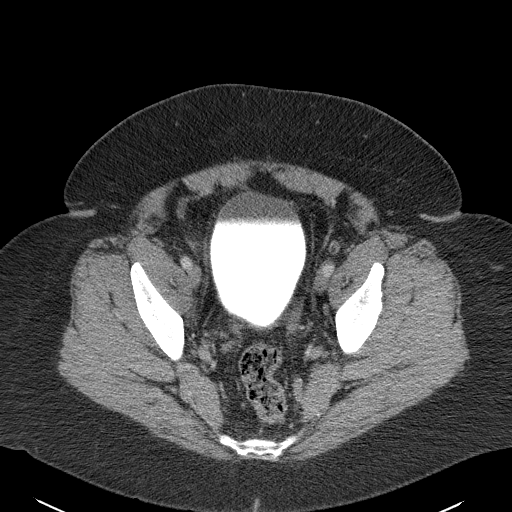
[im 46/78  soft-tissue]
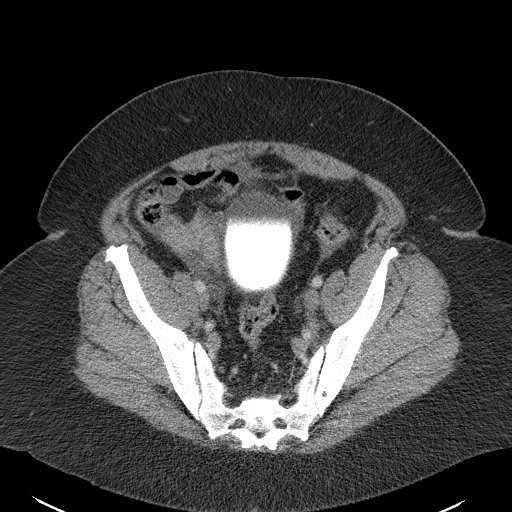
[im 53/78  soft-tissue]
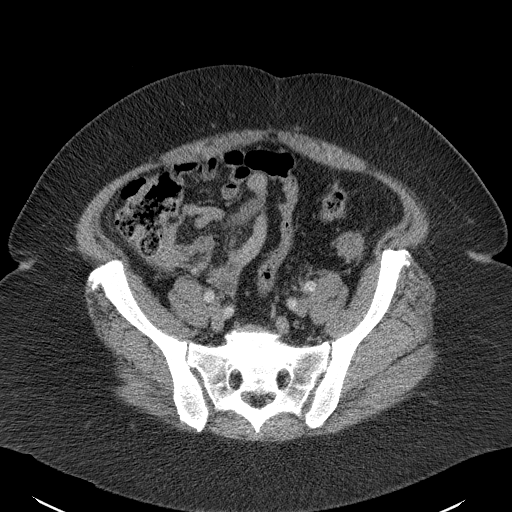
[im 60/78  soft-tissue]
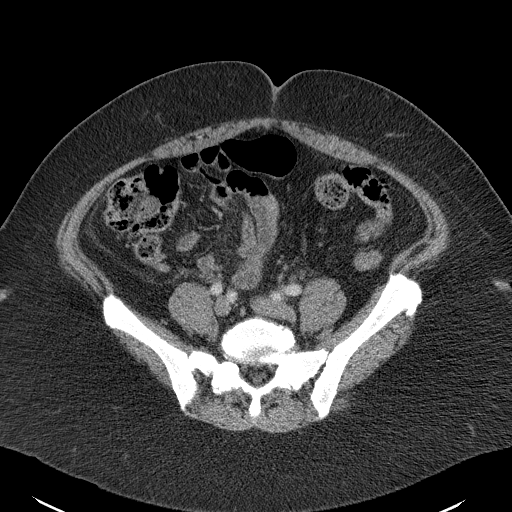
[im 60/78  bone]
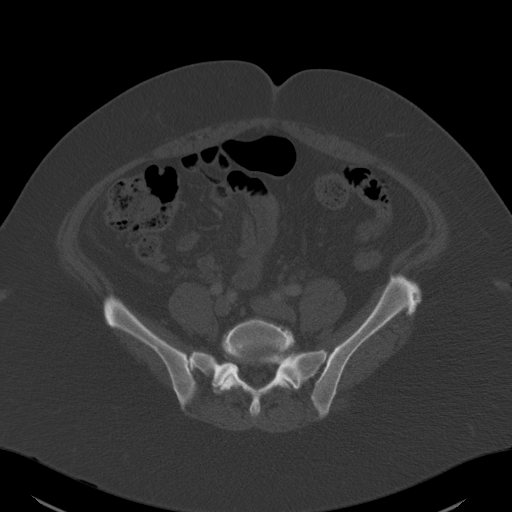
[im 67/78  soft-tissue]
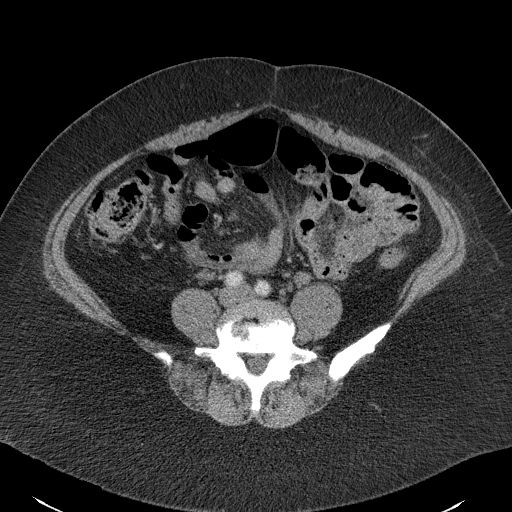
[im 74/78  soft-tissue]
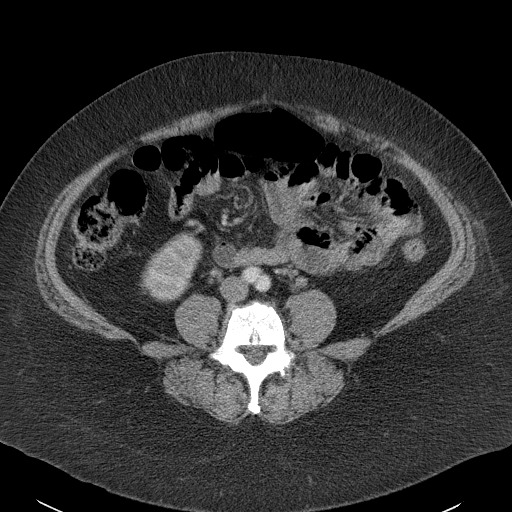

[14 of 46 positions shown; findings below may reference images not displayed]

FINDINGS: Urinary Tract: The visualized distal ureters are unremarkable in
appearance. The bladder is well defined and well opacified. A Foley
catheter is in place. A small amount of air within the bladder
consistent with recent instrumentation. No evidence of extravasation
of contrast into the surrounding soft tissues or into the adjacent
vagina.

Bowel: No evidence of obstruction or focal bowel wall thickening.
Normal appendix in the right lower quadrant. The terminal ileum is
unremarkable.

Vascular/Lymphatic: No pathologically enlarged lymph nodes. No
significant vascular abnormality seen.

Reproductive: Surgical changes of prior hysterectomy. Small
low-attenuation collection in the recess of [place] adjacent to the
vaginal cuff measures 2.6 x 1.9 x 2.0 cm.

Other:  None.

Musculoskeletal: No acute osseous abnormality. No evidence of lytic
or blastic bony lesion.
IMPRESSION: 1. No convincing evidence of fistula between the lower urinary tract
and vagina.
2. There is a small low-attenuation collection in the pelvic
cul-de-sac adjacent to the vaginal cuff measuring 2.6 x 1.9 x 2.0 cm
(volume = 5.2 cm^3). This may represent a small amount of ascites
or postoperative hematoma/seroma and could represent a potential
source for vaginal discharge.

## 2018-11-19 ENCOUNTER — Telehealth: Payer: Self-pay

## 2018-11-19 ENCOUNTER — Other Ambulatory Visit: Payer: Self-pay

## 2018-11-19 ENCOUNTER — Telehealth: Payer: Self-pay | Admitting: *Deleted

## 2018-11-19 DIAGNOSIS — Z1211 Encounter for screening for malignant neoplasm of colon: Secondary | ICD-10-CM

## 2018-11-19 MED ORDER — CLENPIQ 10-3.5-12 MG-GM -GM/160ML PO SOLN: 1.0000 | Freq: Once | ORAL | 0 refills | Status: AC

## 2018-11-19 MED FILL — CLENPIQ 10-3.5-12 MG-GM -GM: 10-3.5-12 M | 2 days supply | Qty: 320 | Fill #0

## 2018-11-19 NOTE — Telephone Encounter (Signed)
Called and scheduled the patient's follow up to see Dr. Denman George in July

## 2018-11-19 NOTE — Telephone Encounter (Signed)
Called pt, TCS w/Propofol scheduled for 02/07/19 at 12:00pm. Rx for prep sent to pharmacy. Orders entered. Informed endo scheduler pt will need urine drug screen with pre-op labs. Pt called office back and informed her of pre-op appt 02/04/19 at 10:00am. Letter mailed with procedure instructions.

## 2018-11-28 ENCOUNTER — Ambulatory Visit: Payer: Medicaid Other | Admitting: Gynecologic Oncology

## 2018-12-19 ENCOUNTER — Inpatient Hospital Stay: Payer: Medicaid Other | Admitting: Gynecologic Oncology

## 2019-01-08 ENCOUNTER — Ambulatory Visit: Payer: Medicaid Other | Admitting: Family Medicine

## 2019-01-09 ENCOUNTER — Ambulatory Visit: Payer: Medicaid Other | Attending: Family Medicine | Admitting: Family Medicine

## 2019-01-09 ENCOUNTER — Other Ambulatory Visit: Payer: Self-pay

## 2019-01-09 ENCOUNTER — Encounter: Payer: Self-pay | Admitting: Family Medicine

## 2019-01-09 DIAGNOSIS — I10 Essential (primary) hypertension: Secondary | ICD-10-CM | POA: Diagnosis not present

## 2019-01-09 DIAGNOSIS — I5032 Chronic diastolic (congestive) heart failure: Secondary | ICD-10-CM

## 2019-01-09 MED ORDER — HYDRALAZINE HCL 50 MG PO TABS: 50.0000 mg | ORAL_TABLET | Freq: Three times a day (TID) | ORAL | 1 refills | Status: DC

## 2019-01-09 MED ORDER — METOPROLOL TARTRATE 25 MG PO TABS: 25.0000 mg | ORAL_TABLET | Freq: Two times a day (BID) | ORAL | 1 refills | Status: DC

## 2019-01-09 MED ORDER — AMLODIPINE BESYLATE 10 MG PO TABS: 10.0000 mg | ORAL_TABLET | Freq: Every day | ORAL | 1 refills | Status: DC

## 2019-01-09 MED ORDER — FUROSEMIDE 40 MG PO TABS: 40.0000 mg | ORAL_TABLET | Freq: Two times a day (BID) | ORAL | 1 refills | Status: DC

## 2019-01-09 MED FILL — hydrALAZINE HCL 50 MG TABS: 50 | 90 days supply | Qty: 270 | Fill #0

## 2019-01-09 MED FILL — METOPROLOL TARTRATE 25 MG T: 25 | 90 days supply | Qty: 180 | Fill #0

## 2019-01-09 MED FILL — FUROSEMIDE 40 MG TAB: 40 | 90 days supply | Qty: 180 | Fill #0

## 2019-01-09 MED FILL — AMLODIPINE BESYLATE 10 MG T: 10 | 90 days supply | Qty: 90 | Fill #0

## 2019-01-09 NOTE — Progress Notes (Signed)
Patient has been called and DOB has been verified. Patient has been screened and transferred to PCP to start phone visit.     

## 2019-01-09 NOTE — Progress Notes (Signed)
Virtual Visit via Telephone Note  I connected with [name], on 01/09/2019 at 11:23 AM by telephone due to the COVID-19 pandemic and verified that I am speaking with the correct person using two identifiers.   Consent: I discussed the limitations, risks, security and privacy concerns of performing an evaluation and management service by telephone and the availability of in person appointments. I also discussed with the patient that there may be a patient responsible charge related to this service. The patient expressed understanding and agreed to proceed.   Location of Patient: Home  Location of Provider: Clinic   Persons participating in Telemedicine visit: Carrol Hougland Farrington-CMA Dr. Felecia Shelling     History of Present Illness: Angela Gardner  is a 58 year old female with a history of hypertension, stage III chronic kidney disease, diastolic CHF (EF 60 to 16% from 07/2018), Endometrial carcinoma diagnosed in 09/2017 s/p total abdominal hysterectomy and bilateral salpingo-oophorectomy on 11/14/2017, status post radiation and chemotherapy who presents today for follow-up visit.  Last visit with radiation oncology was in 09/2018 and with cardiology was in 07/2018 She endorses compliance with her medications and her blood pressure taking just now is 174/98.  She just got done eating breakfast which included bacon. 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 Denies chest pain, pedal edema, orthopnea. With regards to healthcare maintenance she has an appointment coming up for colonoscopy next month. She has no additional concerns today.  Past Medical History:  Diagnosis Date  . Anemia   . 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 CHF (congestive heart failure) ([place])   . Dyspnea    still having this and not moving around alot -gets worse with exertion  . History of sleep walking   . Hx of left breast biopsy   . Hyperlipemia   .  Hypertension    not on any medication now-[facility] had  prescribed her a med and she stopped taking them cannot remember when   Allergies  Allergen Reactions  . Vicodin [Hydrocodone-Acetaminophen] Itching    Current Outpatient Medications on File Prior to Visit  Medication Sig Dispense Refill  . acetaminophen (TYLENOL) 500 MG tablet Take 1,000 mg by mouth every 6 (six) hours as needed.    . furosemide (LASIX) 40 MG tablet Take 1 tablet (40 mg total) by mouth 2 (two) times daily. For 1 week then resume 1 tablet daily 180 tablet 1  . hydrALAZINE (APRESOLINE) 25 MG tablet Take 1 tablet (25 mg total) by mouth 3 (three) times daily. 270 tablet 1  . ibuprofen (ADVIL,MOTRIN) 200 MG tablet Take 400 mg by mouth every 6 (six) hours as needed.    . magic mouthwash w/lidocaine SOLN Take 5 mLs by mouth 4 (four) times daily as needed for mouth pain. Swish and spit 140 mL 0  . metoprolol tartrate (LOPRESSOR) 25 MG tablet Take 1 tablet (25 mg total) by mouth 2 (two) times daily. 90 tablet 1  . polyethylene glycol (MIRALAX / GLYCOLAX) packet Take 17 g by mouth daily as needed for mild constipation.     . Vitamin D, Ergocalciferol, (DRISDOL) 1.25 MG (50000 UT) CAPS capsule TAKE 1 CAPSULE BY MOUTH EVERY 7 DAYS 12 capsule 0  . amLODipine (NORVASC) 10 MG tablet Take 1 tablet (10 mg total) by mouth daily. 90 tablet 1  . [DISCONTINUED] prochlorperazine (COMPAZINE) 10 MG tablet Take 1 tablet (10 mg total) by mouth every 6 (six) hours as needed (Nausea or  vomiting). (Patient not taking: Reported on 05/25/2018) 30 tablet 1   No current facility-administered medications on file prior to visit.     Observations/Objective: Alert, awake, oriented x3 Not in acute distress  Assessment and Plan: 1. Essential hypertension Uncontrolled with blood pressure of 174/98 taken today Increase hydralazine Counseled on blood pressure goal of less than 130/80, low-sodium, DASH diet, medication compliance, 150 minutes of  moderate intensity exercise per week. Discussed medication compliance, adverse effects. - amLODipine (NORVASC) 10 MG tablet; Take 1 tablet (10 mg total) by mouth daily.  Dispense: 90 tablet; Refill: 1 - hydrALAZINE (APRESOLINE) 50 MG tablet; Take 1 tablet (50 mg total) by mouth 3 (three) times daily.  Dispense: 270 tablet; Refill: 1 - metoprolol tartrate (LOPRESSOR) 25 MG tablet; Take 1 tablet (25 mg total) by mouth 2 (two) times daily.  Dispense: 90 tablet; Refill: 1 - CMP14+EGFR; Future - Lipid panel; Future  2. Chronic diastolic congestive heart failure, NYHA class 3 (HCC) Euvolemic - furosemide (LASIX) 40 MG tablet; Take 1 tablet (40 mg total) by mouth 2 (two) times daily.  Dispense: 180 tablet; Refill: 1   Follow Up Instructions: 6 months   I discussed the assessment and treatment plan with the patient. The patient was provided an opportunity to ask questions and all were answered. The patient agreed with the plan and demonstrated an understanding of the instructions.   The patient was advised to call back or seek an in-person evaluation if the symptoms worsen or if the condition fails to improve as anticipated.     I provided 15 minutes total of non-face-to-face time during this encounter including median intraservice time, reviewing previous notes, labs, imaging, medications, management and patient verbalized understanding.     Charlott Rakes, MD, FAAFP. [facility], [place]   01/09/2019, 11:23 AM

## 2019-01-14 ENCOUNTER — Encounter: Payer: Self-pay | Admitting: Gynecologic Oncology

## 2019-01-14 ENCOUNTER — Other Ambulatory Visit: Payer: Self-pay

## 2019-01-14 ENCOUNTER — Inpatient Hospital Stay: Payer: Medicaid Other | Attending: Gynecologic Oncology | Admitting: Gynecologic Oncology

## 2019-01-14 VITALS — BP 141/108 | HR 55 | Temp 97.8°F | Resp 17 | Ht 64.0 in | Wt 249.4 lb

## 2019-01-14 DIAGNOSIS — Z90722 Acquired absence of ovaries, bilateral: Secondary | ICD-10-CM | POA: Diagnosis not present

## 2019-01-14 DIAGNOSIS — Z79899 Other long term (current) drug therapy: Secondary | ICD-10-CM | POA: Diagnosis not present

## 2019-01-14 DIAGNOSIS — E669 Obesity, unspecified: Secondary | ICD-10-CM | POA: Insufficient documentation

## 2019-01-14 DIAGNOSIS — Z90710 Acquired absence of both cervix and uterus: Secondary | ICD-10-CM | POA: Insufficient documentation

## 2019-01-14 DIAGNOSIS — I110 Hypertensive heart disease with heart failure: Secondary | ICD-10-CM | POA: Insufficient documentation

## 2019-01-14 DIAGNOSIS — Z87891 Personal history of nicotine dependence: Secondary | ICD-10-CM | POA: Insufficient documentation

## 2019-01-14 DIAGNOSIS — Z923 Personal history of irradiation: Secondary | ICD-10-CM | POA: Diagnosis not present

## 2019-01-14 DIAGNOSIS — E7800 Pure hypercholesterolemia, unspecified: Secondary | ICD-10-CM | POA: Diagnosis not present

## 2019-01-14 DIAGNOSIS — C541 Malignant neoplasm of endometrium: Secondary | ICD-10-CM

## 2019-01-14 DIAGNOSIS — Z9221 Personal history of antineoplastic chemotherapy: Secondary | ICD-10-CM | POA: Diagnosis not present

## 2019-01-14 NOTE — Progress Notes (Signed)
Follow-up Note: Gyn-Onc  Consult was requested by Dr. Hulan Fray for the evaluation of Angela Gardner 58 y.o. female  CC:  Chief Complaint  Patient presents with  . endometrial cancer    follow-up    Assessment/Plan:  [name]  is a 58 y.o.  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 High risk factors for recurrence given the unfavorable cell type. Recommendation was for 6 cycles of carboplatin and paclitaxel chemotherapy and vaginal brachytherapy to reduce risk for local recurrence in accordance with NCCN guidelines. S/p adjuvant therapy with 6cycles of carb/taxol and vaginal brachytherapy completed November, 2019.  HPI: Angela Gardner is a 58 year old P1 who is seen in consultation at the request of Dr Hulan Fray for grade 3 endometrial cancer.   The patient transitioned through menopause prior to age 33.  She had an episode of postmenopausal bleeding in approximately 2017 at which time she had a D&C procedure revealing benign pathology.  She stopped vaginal bleeding until approximately November 2018 when she began having heavy menstrual-like bleeding and was started on Megace.  This helped some however bleeding persisted and therefore she underwent a transvaginal ultrasound scan on 09/15/2017 this revealed a uterus measuring 11.2 x 5 x 6.9 cm with a subserosal fundal fibroid measuring up to 3.8 cm.  The endometrial thickness was 7 mm with concern for an echogenic mass centrally within the endometrium measuring 3.8 x 3.8 x 3.6 cm.  She then underwent an endometrial Pipelle biopsy on 10/09/2017 which revealed high-grade adenocarcinoma.  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  Due to the finding of a high-grade endometrial cancer that she underwent a CT scan of the chest abdomen and pelvis on September 12, 2017 this revealed grossly unremarkable uterus and ovaries, no lymphadenopathy, no  gross evidence of extrauterine disease or metastases.  The patient otherwise is obese with hypertension and hypercholesterolemia.  She has had one prior pregnancy which resulted in a cesarean delivery.  She has had an open cholecystectomy.  On 11/14/17 she underwent robotically assisted total hysterectomy, BSO, SLN biopsy. Postoperatively her recovery was complicated by dizziness, dehydration and azotemia requiring an additional night's stay in the hospital. This resolved with hydration. Preop urine tox screen was positive for cocaine. .  Final pathology revealed a 2.3cm high grade serous endometrial cancer with sarcomatoid features. The carcinoma invades 0.5cm of 3cm in myometrium. Lymph nodes (left sentinel, right pelvic) were negative. No LVSI was present. No cervical or adnexal involvement. She was determined to have stage IA high grade serous carcinoma of the uterus and was recommended to receive adjuvant therapy in the form of 6 cycles of carboplatin and paclitaxel and vaginal brachytherapy in accordance with NCCN guidelines.   On 12/21/17 the patient began experiencing leakage of clear fluid from the vagina. She was seen in the [place] ER on 12/22/17 and a CT urogram with retrograde cystogram was performed. It showed no extravasation of contrast around the kidneys, from the ureters or around the vagina. There was a 2cm collection of fluid adjacent to the vagina. No definitive fistula/leak was identified. The leakage of fluid spontaneously resolved.   She went on to complete 6 cycles of carboplatin paclitaxel chemotherapy with Dr. go such between June and November 2019.  She also completed 30 Gy of vaginal brachii therapy with Dr. Sondra Come in 5 fractions completed in November 2019.  Interval Hx:  She  has no complaints and denies persistent toxicity of therapy.  Today we discussed survivorship issues including weight gain/management.   Current Meds:  Outpatient Encounter Medications as of  01/14/2019  Medication Sig  . acetaminophen (TYLENOL) 500 MG tablet Take 1,000 mg by mouth every 6 (six) hours as needed.  Marland Kitchen amLODipine (NORVASC) 10 MG tablet Take 1 tablet (10 mg total) by mouth daily.  . furosemide (LASIX) 40 MG tablet Take 1 tablet (40 mg total) by mouth 2 (two) times daily.  . hydrALAZINE (APRESOLINE) 50 MG tablet Take 1 tablet (50 mg total) by mouth 3 (three) times daily.  Marland Kitchen ibuprofen (ADVIL,MOTRIN) 200 MG tablet Take 400 mg by mouth every 6 (six) hours as needed.  . magic mouthwash w/lidocaine SOLN Take 5 mLs by mouth 4 (four) times daily as needed for mouth pain. Swish and spit  . metoprolol tartrate (LOPRESSOR) 25 MG tablet Take 1 tablet (25 mg total) by mouth 2 (two) times daily.  . polyethylene glycol (MIRALAX / GLYCOLAX) packet Take 17 g by mouth daily as needed for mild constipation.   . Vitamin D, Ergocalciferol, (DRISDOL) 1.25 MG (50000 UT) CAPS capsule TAKE 1 CAPSULE BY MOUTH EVERY 7 DAYS  . [DISCONTINUED] prochlorperazine (COMPAZINE) 10 MG tablet Take 1 tablet (10 mg total) by mouth every 6 (six) hours as needed (Nausea or vomiting). (Patient not taking: Reported on 05/25/2018)   No facility-administered encounter medications on file as of 01/14/2019.     Allergy:  Allergies  Allergen Reactions  . Vicodin [Hydrocodone-Acetaminophen] Itching    Social Hx:   Social History   Socioeconomic History  . Marital status: Single    Spouse name: Not on file  . Number of children: 1  . Years of education: Not on file  . Highest education level: 9th grade  Occupational History  . Not on file  Social Needs  . Financial resource strain: Not on file  . Food insecurity    Worry: Not on file    Inability: Not on file  . Transportation needs    Medical: No    Non-medical: No  Tobacco Use  . Smoking status: Former Smoker    Packs/day: 0.30    Years: 26.00    Pack years: 7.80    Types: Cigarettes  . Smokeless tobacco: Never Used  . Tobacco comment: quit May  2019  Substance and Sexual Activity  . Alcohol use: Yes    Comment: social drinking  . Drug use: Yes    Types: Cocaine, "Crack" cocaine    Comment: last time used was over year  . Sexual activity: Not Currently    Birth control/protection: Post-menopausal  Lifestyle  . Physical activity    Days per week: Not on file    Minutes per session: Not on file  . Stress: Not on file  Relationships  . Social Herbalist on phone: Not on file    Gets together: Not on file    Attends religious service: Not on file    Active member of club or organization: Not on file    Attends meetings of clubs or organizations: Not on file    Relationship status: Not on file  . Intimate partner violence    Fear of current or ex partner: Not on file    Emotionally abused: Not on file    Physically abused: Not on file    Forced sexual activity: Not on file  Other Topics Concern  . Not on file  Social History Narrative  . Not on file    Past Surgical Hx:  Past Surgical History:  Procedure Laterality Date  . BREAST BIOPSY     left breast  . CESAREAN SECTION     one  . CHOLECYSTECTOMY    . DILATION AND CURETTAGE OF UTERUS     approx 1 year ago  . IR IMAGING GUIDED PORT INSERTION  12/06/2017  . IR REMOVAL TUN ACCESS W/ PORT W/O FL MOD SED  06/19/2018  . LYMPH NODE BIOPSY N/A 11/14/2017   Procedure: SENTINEL LYMPH NODE BIOPSY;  Surgeon: Everitt Amber, MD;  Location: [facility];  Service: Gynecology;  Laterality: N/A;  . ROBOTIC ASSISTED TOTAL HYSTERECTOMY WITH BILATERAL SALPINGO OOPHERECTOMY N/A 11/14/2017   Procedure: XI ROBOTIC ASSISTED TOTAL HYSTERECTOMY WITH BILATERAL SALPINGO OOPHORECTOMY, RIGHT PELVIC LYMPHADENECTOMY;  Surgeon: Everitt Amber, MD;  Location: [facility];  Service: Gynecology;  Laterality: N/A;    Past Medical Hx:  Past Medical History:  Diagnosis Date  . Anemia   . 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 CHF (congestive heart failure) ([place])   . Dyspnea    still having this  and not moving around alot -gets worse with exertion  . History of sleep walking   . Hx of left breast biopsy   . Hyperlipemia   . Hypertension    not on any medication now-[facility] had  prescribed her a med and she stopped taking them cannot remember when    Past Gynecological History:  C/s x 1, positive high risk HPV in 2019. No LMP recorded (lmp unknown). Patient has had a hysterectomy.  Family Hx:  Family History  Problem Relation Age of Onset  . Anuerysm Mother   . Congestive Heart Failure Mother   . Hypertension Father   . Hypertension Sister   . Hypertension Brother   . Colon cancer Neg Hx     Review of Systems:  Constitutional  Feels well,    ENT Normal appearing ears and nares bilaterally Skin/Breast  No rash, sores, jaundice, itching, dryness Cardiovascular  No chest pain, shortness of breath, or edema  Pulmonary  No cough or wheeze.  Gastro Intestinal  No nausea, vomitting, or diarrhoea. No bright red blood per rectum, no abdominal pain, change in bowel movement, or constipation.  Genito Urinary  No frequency, urgency, dysuria, no leakage of blood or fluid.    Musculo Skeletal  No myalgia, arthralgia, joint swelling or pain  Neurologic  No weakness, numbness, change in gait,  Psychology  No depression, anxiety, insomnia.   Vitals:  Blood pressure (!) 141/108, pulse (!) 55, temperature 97.8 F (36.6 C), temperature source Oral, resp. rate 17, height 5\' 4"  (1.626 m), weight 249 lb 6.4 oz (113.1 kg), SpO2 100 %.  Physical Exam: WD in NAD Neck  Supple NROM, without any enlargements.  Lymph Node Survey No cervical supraclavicular or inguinal adenopathy Cardiovascular  Pulse normal rate, regularity and rhythm. S1 and S2 normal.  Lungs  Clear to auscultation bilateraly, without wheezes/crackles/rhonchi. Good air movement.  Skin  No rash/lesions/breakdown  Psychiatry  Alert and oriented to person, place, and time  Abdomen  Normoactive bowel sounds,  abdomen soft, non-tender and obese without evidence of hernia. Incisions are well healed and soft.  Back No CVA tenderness Genito Urinary  Well healed vaginal cuff, no lesions, no masses, no blood, no drainage. No pelvic masses.  Rectal  deferred Extremities  + subtle left lower extremity edema   Thereasa Solo,  MD  01/14/2019, 3:26 PM

## 2019-01-14 NOTE — Patient Instructions (Signed)
Please notify Dr Denman George at phone number [phone] if you notice vaginal bleeding, new pelvic or abdominal pains, bloating, feeling full easy, or a change in bladder or bowel function.   Please contact Dr Serita Grit office (at [phone]) in October, 2020 to request an appointment with her for January, 2021.

## 2019-01-15 ENCOUNTER — Other Ambulatory Visit: Payer: Medicaid Other

## 2019-01-31 NOTE — Patient Instructions (Signed)
Anaia Frith  01/31/2019     @PREFPERIOPPHARMACY @   Your procedure is scheduled on  02/07/2019.  Report to [facility] at  1030  A.M.  Call this number if you have problems the morning of surgery:  [phone]   Remember:  Follow the diet and prep instructions given to you by Dr Nona Dell office.                       Take these medicines the morning of surgery with A SIP OF WATER  Amlodipine, metoprolol.    Do not wear jewelry, make-up or nail polish.  Do not wear lotions, powders, or perfumes. Please wear deodorant and brush your teeth.  Do not shave 48 hours prior to surgery.  Men may shave face and neck.  Do not bring valuables to the hospital.  [facility] is not responsible for any belongings or valuables.  Contacts, dentures or bridgework may not be worn into surgery.  Leave your suitcase in the car.  After surgery it may be brought to your room.  For patients admitted to the hospital, discharge time will be determined by your treatment team.  Patients discharged the day of surgery will not be allowed to drive home.   Name and phone number of your driver:   family Special instructions:  None  Please read over the following fact sheets that you were given. Anesthesia Post-op Instructions and Care and Recovery After Surgery       Colonoscopy, Adult, Care After This sheet gives you information about how to care for yourself after your procedure. Your health care provider may also give you more specific instructions. If you have problems or questions, contact your health care provider. What can I expect after the procedure? After the procedure, it is common to have:  A small amount of blood in your stool for 24 hours after the procedure.  Some gas.  Mild abdominal cramping or bloating. Follow these instructions at home: General instructions  For the first 24 hours after the procedure: ? Do not drive or use machinery. ? Do not sign important documents.  ? Do not drink alcohol. ? Do your regular daily activities at a slower pace than normal. ? Eat soft, easy-to-digest foods.  Take over-the-counter or prescription medicines only as told by your health care provider. Relieving cramping and bloating   Try walking around when you have cramps or feel bloated.  Apply heat to your abdomen as told by your health care provider. Use a heat source that your health care provider recommends, such as a moist heat pack or a heating pad. ? Place a towel between your skin and the heat source. ? Leave the heat on for 20-30 minutes. ? Remove the heat if your skin turns bright red. This is especially important if you are unable to feel pain, heat, or cold. You may have a greater risk of getting burned. Eating and drinking   Drink enough fluid to keep your urine pale yellow.  Resume your normal diet as instructed by your health care provider. Avoid heavy or fried foods that are hard to digest.  Avoid drinking alcohol for as long as instructed by your health care provider. Contact a health care provider if:  You have blood in your stool 2-3 days after the procedure. Get help right away if:  You have more than a small spotting of blood in your stool.  You pass large  blood clots in your stool.  Your abdomen is swollen.  You have nausea or vomiting.  You have a fever.  You have increasing abdominal pain that is not relieved with medicine. Summary  After the procedure, it is common to have a small amount of blood in your stool. You may also have mild abdominal cramping and bloating.  For the first 24 hours after the procedure, do not drive or use machinery, sign important documents, or drink alcohol.  Contact your health care provider if you have a lot of blood in your stool, nausea or vomiting, a fever, or increased abdominal pain. This information is not intended to replace advice given to you by your health care provider. Make sure you discuss  any questions you have with your health care provider. Document Released: 01/19/2004 Document Revised: 03/29/2017 Document Reviewed: 08/18/2015 Elsevier Patient Education  2020 [place] After These instructions provide you with information about caring for yourself after your procedure. Your health care provider may also give you more specific instructions. Your treatment has been planned according to current medical practices, but problems sometimes occur. Call your health care provider if you have any problems or questions after your procedure. What can I expect after the procedure? After your procedure, you may:  Feel sleepy for several hours.  Feel clumsy and have poor balance for several hours.  Feel forgetful about what happened after the procedure.  Have poor judgment for several hours.  Feel nauseous or vomit.  Have a sore throat if you had a breathing tube during the procedure. Follow these instructions at home: For at least 24 hours after the procedure:      Have a responsible adult stay with you. It is important to have someone help care for you until you are awake and alert.  Rest as needed.  Do not: ? Participate in activities in which you could fall or become injured. ? Drive. ? Use heavy machinery. ? Drink alcohol. ? Take sleeping pills or medicines that cause drowsiness. ? Make important decisions or sign legal documents. ? Take care of children on your own. Eating and drinking  Follow the diet that is recommended by your health care provider.  If you vomit, drink water, juice, or soup when you can drink without vomiting.  Make sure you have little or no nausea before eating solid foods. General instructions  Take over-the-counter and prescription medicines only as told by your health care provider.  If you have sleep apnea, surgery and certain medicines can increase your risk for breathing problems. Follow  instructions from your health care provider about wearing your sleep device: ? Anytime you are sleeping, including during daytime naps. ? While taking prescription pain medicines, sleeping medicines, or medicines that make you drowsy.  If you smoke, do not smoke without supervision.  Keep all follow-up visits as told by your health care provider. This is important. Contact a health care provider if:  You keep feeling nauseous or you keep vomiting.  You feel light-headed.  You develop a rash.  You have a fever. Get help right away if:  You have trouble breathing. Summary  For several hours after your procedure, you may feel sleepy and have poor judgment.  Have a responsible adult stay with you for at least 24 hours or until you are awake and alert. This information is not intended to replace advice given to you by your health care provider. Make sure you discuss any questions  you have with your health care provider. Document Released: 09/27/2015 Document Revised: 09/04/2017 Document Reviewed: 09/27/2015 Elsevier Patient Education  2020 [employer].

## 2019-02-04 ENCOUNTER — Other Ambulatory Visit: Payer: Self-pay

## 2019-02-04 ENCOUNTER — Telehealth: Payer: Self-pay | Admitting: *Deleted

## 2019-02-04 ENCOUNTER — Encounter (HOSPITAL_COMMUNITY)
Admission: RE | Admit: 2019-02-04 | Discharge: 2019-02-04 | Disposition: A | Discharge status: Home / Self Care | Payer: Medicaid Other | Source: Ambulatory Visit | Attending: Gastroenterology | Admitting: Gastroenterology

## 2019-02-04 ENCOUNTER — Encounter (HOSPITAL_COMMUNITY): Payer: Self-pay

## 2019-02-04 NOTE — Progress Notes (Addendum)
Patient rescheduled for TCS, as she admits to doing cocaine and medications that aren't prescribed to her.  After speaking with Dr Loretha Stapler, patient will need to be rescheduled for next week.  [name] in office notified and will reschedule patient for procedure and COVID testing.

## 2019-02-04 NOTE — Telephone Encounter (Signed)
Received a call from Tomi in Endo. Patient used cocaine last night and taking prescription medications that did not belong to her. Patient last seen in May. Dr. Oneida Alar does patient need another OV to r/s? thanks

## 2019-02-06 NOTE — Telephone Encounter (Signed)
LMOVM for pt 

## 2019-02-06 NOTE — Telephone Encounter (Signed)
Patient called. I advised her of SLF message. Patient shouted "30 DAYS!" I advised yes! Patient will call once she knows she can stay clean for 30 days to schedule procedure. She stated "okay". I also advised regarding counseling and the reason she should not mix Rx medications that are not hers and cocaine as it can results in death. Patient mumbled something and when asked for her to repeat she just stated "nothing". She is in pain and ibuprofen/tylenol don't do anything for her.

## 2019-02-06 NOTE — Telephone Encounter (Signed)
PLEASE CALL PT. She needs to avoid cocaine for 30 days prior to her colonoscopy. SHE SHOULD AVOID TAKING Rx DRUGS THAT ARE PRESCRIBED FOR SOMEONE ELSE. BOTH COCAINE AND TAKING THE WRONG MEDICATION CAN RESULTS IN DEATH. PT CAN CONTACT us TO [facility] HER COLONOSCOPY WHEN SHE HAS ABSTAINED FROM RECREATIONAL DRUG USE. SHE SHOULD ALSO CONSIDER COUNSELING OR NARCOTICS ANONYMOUS.

## 2019-02-07 ENCOUNTER — Ambulatory Visit (HOSPITAL_COMMUNITY): Admission: RE | Admit: 2019-02-07 | Payer: Medicaid Other | Source: Home / Self Care | Admitting: Gastroenterology

## 2019-02-07 ENCOUNTER — Encounter (HOSPITAL_COMMUNITY): Admission: RE | Payer: Self-pay | Source: Home / Self Care

## 2019-02-07 SURGERY — COLONOSCOPY WITH PROPOFOL
Anesthesia: Monitor Anesthesia Care

## 2019-02-08 NOTE — Telephone Encounter (Signed)
Noted  

## 2019-03-24 NOTE — Progress Notes (Signed)
Radiation Oncology         [phone] ________________________________  Name: Angela Gardner MRN: [id number]  Date: 03/25/2019  DOB: 10-Sep-1960  Follow-Up Visit Note  CC: Charlott Rakes, MD  Everitt Amber, MD    ICD-10-CM   1. Endometrial cancer (HCC)  C54.1     Diagnosis:   58 y.o. female with Stage IA grade 3 serous endometrial cancer, high-grade with sarcomatoid features  Interval Since Last Radiation:  1 year, 1 month  01/16/18 - 02/21/18: Vaginal Cuff/ 30 Gy in 5 fractions of 6 Gy  Narrative:  The patient returns today for routine follow-up. She last saw Dr. Denman George on 01/14/2019 with no evidence of disease on exam. She endorses using her vaginal dilator once a week.  On review of systems, she is without complaints today. However, she reports falling at home just prior to coming to the clinic today and hurt her left foot. She denies pain, dysuria or hematuria, vaginal discharge or bleeding, rectal bleeding, and diarrhea or constipation.   ALLERGIES:  is allergic to vicodin [hydrocodone-acetaminophen].  Meds: Current Outpatient Medications  Medication Sig Dispense Refill   acetaminophen (TYLENOL) 500 MG tablet Take 1,000 mg by mouth every 6 (six) hours as needed for moderate pain.      amLODipine (NORVASC) 10 MG tablet Take 1 tablet (10 mg total) by mouth daily. 90 tablet 1   furosemide (LASIX) 40 MG tablet Take 1 tablet (40 mg total) by mouth 2 (two) times daily. 180 tablet 1   hydrALAZINE (APRESOLINE) 50 MG tablet Take 1 tablet (50 mg total) by mouth 3 (three) times daily. 270 tablet 1   magic mouthwash w/lidocaine SOLN Take 5 mLs by mouth 4 (four) times daily as needed for mouth pain. Swish and spit 140 mL 0   metoprolol tartrate (LOPRESSOR) 25 MG tablet Take 1 tablet (25 mg total) by mouth 2 (two) times daily. 90 tablet 1   Vitamin D, Ergocalciferol, (DRISDOL) 1.25 MG (50000 UT) CAPS capsule TAKE 1 CAPSULE BY MOUTH EVERY 7 DAYS (Patient taking differently: Take  50,000 Units by mouth every 7 (seven) days. ) 12 capsule 0   No current facility-administered medications for this encounter.     Physical Findings: The patient is in no acute distress. Patient is alert and oriented.  height is 5\' 4"  (1.626 m). Her temporal temperature is 98 F (36.7 C). Her blood pressure is 158/77 (abnormal) and her pulse is 52 (abnormal). Her respiration is 18 and oxygen saturation is 99%.   Lungs are clear to auscultation bilaterally. Heart has regular rate and rhythm. No palpable cervical, supraclavicular, or axillary adenopathy. Abdomen soft, non-tender, normal bowel sounds. On pelvic examination the external genitalia were unremarkable. A speculum exam was performed. On speculum, was able to get a good view of the vaginal cuff and saw no mucosal lesions. There are no mucosal lesions noted in the vaginal vault. On bimanual and rectal examination there were no pelvic masses appreciated.  Vaginal cuff intact.  Lab Findings: Lab Results  Component Value Date   WBC 3.4 (L) 06/19/2018   HGB 10.3 (L) 06/19/2018   HCT 32.7 (L) 06/19/2018   MCV 99.1 06/19/2018   PLT 238 06/19/2018    Radiographic Findings: No results found.  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   No evidence of recurrence on clinical exam.   Plan:  Patient will see Dr. Denman George in 3 months and follow up in radiation oncology in 6  months.  ____________________________________  Blair Promise, PhD, MD  This document serves as a record of services personally performed by Gery Pray, MD. It was created on his behalf by Wilburn Mylar, a trained medical scribe. The creation of this record is based on the scribe's personal observations and the provider's statements to them. This document has been checked and approved by the attending provider.

## 2019-03-25 ENCOUNTER — Encounter: Payer: Self-pay | Admitting: Radiation Oncology

## 2019-03-25 ENCOUNTER — Telehealth: Payer: Self-pay

## 2019-03-25 ENCOUNTER — Other Ambulatory Visit: Payer: Self-pay

## 2019-03-25 ENCOUNTER — Ambulatory Visit
Admission: RE | Admit: 2019-03-25 | Discharge: 2019-03-25 | Disposition: A | Discharge status: Home / Self Care | Payer: Medicaid Other | Source: Ambulatory Visit | Attending: Radiation Oncology | Admitting: Radiation Oncology

## 2019-03-25 VITALS — BP 158/77 | HR 52 | Temp 98.0°F | Resp 18 | Ht 64.0 in

## 2019-03-25 DIAGNOSIS — Z79899 Other long term (current) drug therapy: Secondary | ICD-10-CM | POA: Insufficient documentation

## 2019-03-25 DIAGNOSIS — C541 Malignant neoplasm of endometrium: Secondary | ICD-10-CM

## 2019-03-25 DIAGNOSIS — Z8542 Personal history of malignant neoplasm of other parts of uterus: Secondary | ICD-10-CM | POA: Diagnosis present

## 2019-03-25 DIAGNOSIS — Z923 Personal history of irradiation: Secondary | ICD-10-CM | POA: Insufficient documentation

## 2019-03-25 NOTE — Progress Notes (Signed)
Pt presents today for f/u with Dr. Sondra Come. Pt denies c/o pain. Pt reports falling just prior to presentation to clinic at home. Pt describes home as "very tight trailer". Pt with bandaged LEFT foot. Pt denies dysuria/hematuria. Pt denies vaginal bleeding/discharge. Pt denies rectal bleeding, diarrhea/constipation. Pt states she uses vaginal dilator once per week.  BP (!) 158/77 (BP Location: Left Arm, Patient Position: Sitting)   Pulse (!) 52   Temp 98 F (36.7 C) (Temporal)   Resp 18   Ht 5\' 4"  (1.626 m)   LMP  (LMP Unknown) Comment: vaginal bleeding continuously for last 8 months  SpO2 99%   BMI 42.57 kg/m   Wt Readings from Last 3 Encounters:  02/04/19 248 lb (112.5 kg)  01/14/19 249 lb 6.4 oz (113.1 kg)  09/20/18 259 lb (117.5 kg)   Loma Sousa, RN BSN

## 2019-03-25 NOTE — Telephone Encounter (Signed)
Attempted to contact pt re: today's f/u with Dr. Sondra Come. This RN left VM with direct number for appt reschedule. Loma Sousa, RN BSN

## 2019-03-25 NOTE — Patient Instructions (Signed)
Coronavirus (COVID-19) Are you at risk?  Are you at risk for the Coronavirus (COVID-19)?  To be considered HIGH RISK for Coronavirus (COVID-19), you have to meet the following criteria:  . Traveled to [place], [place], [place], [place] or [place]; or in the [place] to [place], [place], [place], or [place]; and have fever, cough, and shortness of breath within the last 2 weeks of travel OR . Been in close contact with a person diagnosed with COVID-19 within the last 2 weeks and have fever, cough, and shortness of breath . IF YOU DO NOT MEET THESE CRITERIA, YOU ARE CONSIDERED LOW RISK FOR COVID-19.  What to do if you are HIGH RISK for COVID-19?  . If you are having a medical emergency, call 911. . Seek medical care right away. Before you go to a doctor's office, urgent care or emergency department, call ahead and tell them about your recent travel, contact with someone diagnosed with COVID-19, and your symptoms. You should receive instructions from your physician's office regarding next steps of care.  . When you arrive at healthcare provider, tell the healthcare staff immediately you have returned from visiting [place], [place], [place], [place] or [place]; or traveled in the [place] to [place], [place], [place], or [place]; in the last two weeks or you have been in close contact with a person diagnosed with COVID-19 in the last 2 weeks.   . Tell the health care staff about your symptoms: fever, cough and shortness of breath. . After you have been seen by a medical provider, you will be either: o Tested for (COVID-19) and discharged home on quarantine except to seek medical care if symptoms worsen, and asked to  - Stay home and avoid contact with others until you get your results (4-5 days)  - Avoid travel on public transportation if possible (such as bus, train, or airplane) or o Sent to the Emergency Department by EMS for evaluation, COVID-19 testing, and possible  admission depending on your condition and test results.  What to do if you are LOW RISK for COVID-19?  Reduce your risk of any infection by using the same precautions used for avoiding the common cold or flu:  . Wash your hands often with soap and warm water for at least 20 seconds.  If soap and water are not readily available, use an alcohol-based hand sanitizer with at least 60% alcohol.  . If coughing or sneezing, cover your mouth and nose by coughing or sneezing into the elbow areas of your shirt or coat, into a tissue or into your sleeve (not your hands). . Avoid shaking hands with others and consider head nods or verbal greetings only. . Avoid touching your eyes, nose, or mouth with unwashed hands.  . Avoid close contact with people who are sick. . Avoid places or events with large numbers of people in one location, like concerts or sporting events. . Carefully consider travel plans you have or are making. . If you are planning any travel outside or inside the [place], visit the CDC's Travelers' Health webpage for the latest health notices. . If you have some symptoms but not all symptoms, continue to monitor at home and seek medical attention if your symptoms worsen. . If you are having a medical emergency, call 911.   ADDITIONAL HEALTHCARE OPTIONS FOR PATIENTS  [facility] / e-Visit: [email or web address]         [facility]: [phone]  [facility]: [phone]                   [facility]: [phone]   

## 2019-04-22 ENCOUNTER — Telehealth (INDEPENDENT_AMBULATORY_CARE_PROVIDER_SITE_OTHER): Payer: Medicaid Other | Admitting: Cardiology

## 2019-04-22 ENCOUNTER — Other Ambulatory Visit: Payer: Self-pay

## 2019-04-22 ENCOUNTER — Encounter: Payer: Self-pay | Admitting: Cardiology

## 2019-04-22 VITALS — BP 174/120

## 2019-04-22 DIAGNOSIS — I5032 Chronic diastolic (congestive) heart failure: Secondary | ICD-10-CM | POA: Diagnosis not present

## 2019-04-22 DIAGNOSIS — C541 Malignant neoplasm of endometrium: Secondary | ICD-10-CM

## 2019-04-22 DIAGNOSIS — I83892 Varicose veins of left lower extremities with other complications: Secondary | ICD-10-CM

## 2019-04-22 DIAGNOSIS — D6481 Anemia due to antineoplastic chemotherapy: Secondary | ICD-10-CM

## 2019-04-22 NOTE — Patient Instructions (Signed)
Medication Instructions:  Your physician recommends that you continue on your current medications as directed. Please refer to the Current Medication list given to you today.  *If you need a refill on your cardiac medications before your next appointment, please call your pharmacy*  Lab Work: None.  If you have labs (blood work) drawn today and your tests are completely normal, you will receive your results only by: Marland Kitchen MyChart Message (if you have MyChart) OR . A paper copy in the mail If you have any lab test that is abnormal or we need to change your treatment, we will call you to review the results.  Testing/Procedures: None.   Follow-Up: At [facility], you and your health needs are our priority.  As part of our continuing mission to provide you with exceptional heart care, we have created designated Provider Care Teams.  These Care Teams include your primary Cardiologist (physician) and Advanced Practice Providers (APPs -  Physician Assistants and Nurse Practitioners) who all work together to provide you with the care you need, when you need it.  Your next appointment:   6 months  The format for your next appointment:   In Person  Provider:   You may see Jenne Campus, MD or the following Advanced Practice Provider on your designated Care Team:    Laurann Montana, FNP   Other Instructions

## 2019-04-22 NOTE — Progress Notes (Signed)
Virtual Visit via Video Note   This visit type was conducted due to national recommendations for restrictions regarding the COVID-19 Pandemic (e.g. social distancing) in an effort to limit this patient's exposure and mitigate transmission in our community.  Due to her co-morbid illnesses, this patient is at least at moderate risk for complications without adequate follow up.  This format is felt to be most appropriate for this patient at this time.  All issues noted in this document were discussed and addressed.  A limited physical exam was performed with this format.  Please refer to the patient's chart for her consent to telehealth for [facility].  Evaluation Performed:  Follow-up visit  This visit type was conducted due to national recommendations for restrictions regarding the COVID-19 Pandemic (e.g. social distancing).  This format is felt to be most appropriate for this patient at this time.  All issues noted in this document were discussed and addressed.  No physical exam was performed (except for noted visual exam findings with Video Visits).  Please refer to the patient's chart (MyChart message for video visits and phone note for telephone visits) for the patient's consent to telehealth for [facility].  Date:  04/22/2019  ID: Angela Gardner, DOB 1961-03-29, MRN [id number]   Patient Location: [place] [address]   Provider location:   [place] Office  PCP:  Charlott Rakes, MD  Cardiologist:  Jenne Campus, MD     Chief Complaint: Doing very well  History of Present Illness:    Angela Gardner is a 58 y.o. female  who presents via audio/video conferencing for a telehealth visit today.  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  Overall she looks good she sounds good and she  is very happy she says she is doing great she does not have much swelling of lower extremities sometimes every few days she will get some but it does not bother her a lot.  Does not have shortness of breath.  She still stay at home she try to walk inside her house to keep in shape.  But overall again she sounds good and she look good.   The patient does not have symptoms concerning for COVID-19 infection (fever, chills, cough, or new SHORTNESS OF BREATH).    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   CHF (congestive heart failure) (HCC)    Dyspnea    still having this and not moving around alot -gets worse with exertion   History of sleep walking    Hx of left breast biopsy    Hyperlipemia    Hypertension    not on any medication now-[facility] had  prescribed her a med and she stopped taking them cannot remember when    Past Surgical History:  Procedure Laterality Date   BREAST BIOPSY     left breast   CESAREAN SECTION     one   CHOLECYSTECTOMY     DILATION AND CURETTAGE OF UTERUS     approx 1 year ago   IR IMAGING GUIDED PORT INSERTION  12/06/2017  IR REMOVAL TUN ACCESS W/ PORT W/O FL MOD SED  06/19/2018   LYMPH NODE BIOPSY N/A 11/14/2017   Procedure: SENTINEL LYMPH NODE BIOPSY;  Surgeon: Everitt Amber, MD;  Location: [facility];  Service: Gynecology;  Laterality: N/A;   ROBOTIC ASSISTED TOTAL HYSTERECTOMY WITH BILATERAL SALPINGO OOPHERECTOMY N/A 11/14/2017   Procedure: XI ROBOTIC ASSISTED TOTAL HYSTERECTOMY WITH BILATERAL SALPINGO OOPHORECTOMY, RIGHT PELVIC LYMPHADENECTOMY;  Surgeon: Everitt Amber, MD;  Location: [facility];  Service: Gynecology;  Laterality: N/A;     Current Meds  Medication Sig    acetaminophen (TYLENOL) 500 MG tablet Take 1,000 mg by mouth every 6 (six) hours as needed for moderate pain.    amLODipine (NORVASC) 10 MG tablet Take 1 tablet (10 mg total) by mouth daily.   furosemide (LASIX) 40 MG tablet Take 1 tablet (40 mg total) by mouth 2 (two) times daily.   hydrALAZINE (APRESOLINE) 50 MG tablet Take 1 tablet (50 mg total) by mouth 3 (three) times daily.   magic mouthwash w/lidocaine SOLN Take 5 mLs by mouth 4 (four) times daily as needed for mouth pain. Swish and spit   metoprolol tartrate (LOPRESSOR) 25 MG tablet Take 1 tablet (25 mg total) by mouth 2 (two) times daily.   Vitamin D, Ergocalciferol, (DRISDOL) 1.25 MG (50000 UT) CAPS capsule TAKE 1 CAPSULE BY MOUTH EVERY 7 DAYS (Patient taking differently: Take 50,000 Units by mouth every 7 (seven) days. )      Family History: The patient's family history includes Anuerysm in her mother; Congestive Heart Failure in her mother; Hypertension in her brother, father, and sister. There is no history of Colon cancer.   ROS:   Please see the history of present illness.     All other systems reviewed and are negative.   Labs/Other Tests and Data Reviewed:     Recent Labs: 06/19/2018: Hemoglobin 10.3; Platelets 238 07/03/2018: ALT 11 08/06/2018: BUN 25; Creatinine, Ser 1.51; NT-Pro BNP 140; Potassium 3.8; Sodium 140  Recent Lipid Panel    Component Value Date/Time   CHOL 228 (H) 03/30/2009 2039   TRIG 139 03/30/2009 2039   HDL 67 03/30/2009 2039   CHOLHDL 3.4 Ratio 03/30/2009 2039   VLDL 28 03/30/2009 2039   LDLCALC 133 (H) 03/30/2009 2039      Exam:    Vital Signs:  BP (!) 174/120    LMP  (LMP Unknown) Comment: vaginal bleeding continuously for last 8 months    Wt Readings from Last 3 Encounters:  02/04/19 248 lb (112.5 kg)  01/14/19 249 lb 6.4 oz (113.1 kg)  09/20/18 259 lb (117.5 kg)     Well nourished, well developed in no acute distress. Alert awake and attentive when she is talking from  her home I am also in my office at home she is asymptomatic again looks very good  Diagnosis for this visit:   1. Chronic diastolic congestive heart failure, NYHA class 3 (HCC)   2. 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 Antineoplastic chemotherapy induced anemia   4. Endometrial cancer ([place])      ASSESSMENT & PLAN:    1.  Chronic diastolic congestive heart failure [place] Heart Association probably closer to 3 right now.  She does have some swelling of lower extremities take diuretics which I will continue. 2.  Varicose vein which adds to her edema.  But that seems to be under control 3.  Uterine cancer on chemotherapy doing well from that point of view.  COVID-19 Education: The signs and symptoms of COVID-19 were discussed with the patient and how to seek care for testing (follow up with PCP or arrange E-visit).  The importance of social distancing was discussed today.  Patient Risk:   After full review of this patients clinical status, I feel that they are at least moderate risk at this time.  Time:   Today, I have spent 5 minutes with the patient with telehealth technology discussing pt health issues.  I spent 50 minutes reviewing her chart before the visit.  Visit was finished at the end 10:47 AM.    Medication Adjustments/Labs and Tests Ordered: Current medicines are reviewed at length with the patient today.  Concerns regarding medicines are outlined above.  No orders of the defined types were placed in this encounter.  Medication changes: No orders of the defined types were placed in this encounter.    Disposition: Follow-up in 6 months  Signed, Park Liter, MD, [facility] 04/22/2019 10:46 AM    [place]

## 2019-05-24 ENCOUNTER — Telehealth: Payer: Self-pay | Admitting: *Deleted

## 2019-05-24 NOTE — Telephone Encounter (Signed)
CALLED PATIENT TO INFORM OF FU WITH DR. Denman George ON 07-09-19- ARRIVAL TIME- 2:30 PM, LVM FOR A RETURN CALL

## 2019-05-28 ENCOUNTER — Other Ambulatory Visit: Payer: Self-pay | Admitting: Hematology and Oncology

## 2019-05-31 ENCOUNTER — Telehealth: Payer: Self-pay | Admitting: Family Medicine

## 2019-05-31 ENCOUNTER — Other Ambulatory Visit: Payer: Self-pay

## 2019-05-31 MED ORDER — VITAMIN D (ERGOCALCIFEROL) 1.25 MG (50000 UNIT) PO CAPS: ORAL_CAPSULE | ORAL | 0 refills | Status: DC

## 2019-05-31 MED FILL — VIT D2 1.25 MG (50,000 UNIT: 1.25 MG | 84 days supply | Qty: 12 | Fill #0

## 2019-05-31 MED FILL — METOPROLOL TARTRATE 25 MG T: 25 | 90 days supply | Qty: 180 | Fill #0

## 2019-05-31 MED FILL — AMLODIPINE BESYLATE 10 MG T: 10 | 90 days supply | Qty: 90 | Fill #0

## 2019-05-31 MED FILL — hydrALAZINE HCL 50 MG TABS: 50 | 90 days supply | Qty: 270 | Fill #0

## 2019-05-31 MED FILL — FUROSEMIDE 40 MG TAB: 40 | 90 days supply | Qty: 180 | Fill #0

## 2019-05-31 NOTE — Telephone Encounter (Signed)
Patient was called and informed of medication being sent to pharmacy. Patient was also scheduled and appointment with PCP in 1 month.

## 2019-05-31 NOTE — Telephone Encounter (Signed)
Patient is requesting a refill on Vitamin D. Please f/u

## 2019-07-02 ENCOUNTER — Other Ambulatory Visit: Payer: Self-pay

## 2019-07-02 ENCOUNTER — Encounter: Payer: Self-pay | Admitting: Family Medicine

## 2019-07-02 ENCOUNTER — Ambulatory Visit: Payer: Medicaid Other | Attending: Family Medicine | Admitting: Family Medicine

## 2019-07-02 VITALS — BP 130/114 | HR 78 | Ht 64.0 in | Wt 254.0 lb

## 2019-07-02 DIAGNOSIS — Z1211 Encounter for screening for malignant neoplasm of colon: Secondary | ICD-10-CM

## 2019-07-02 DIAGNOSIS — I5032 Chronic diastolic (congestive) heart failure: Secondary | ICD-10-CM | POA: Diagnosis not present

## 2019-07-02 DIAGNOSIS — I10 Essential (primary) hypertension: Secondary | ICD-10-CM

## 2019-07-02 MED ORDER — FUROSEMIDE 40 MG PO TABS: 40.0000 mg | ORAL_TABLET | Freq: Two times a day (BID) | ORAL | 1 refills | Status: DC

## 2019-07-02 MED ORDER — METOPROLOL TARTRATE 50 MG PO TABS: 50.0000 mg | ORAL_TABLET | Freq: Two times a day (BID) | ORAL | 1 refills | Status: DC

## 2019-07-02 MED ORDER — AMLODIPINE BESYLATE 10 MG PO TABS: 10.0000 mg | ORAL_TABLET | Freq: Every day | ORAL | 1 refills | Status: DC

## 2019-07-02 MED ORDER — HYDRALAZINE HCL 50 MG PO TABS: 50.0000 mg | ORAL_TABLET | Freq: Three times a day (TID) | ORAL | 1 refills | Status: DC

## 2019-07-02 NOTE — Patient Instructions (Signed)

## 2019-07-02 NOTE — Progress Notes (Signed)
needs to discuss BP medication.

## 2019-07-02 NOTE — Progress Notes (Signed)
Subjective:  Patient ID: Angela Gardner, female    DOB: 10-May-1961  Age: 59 y.o. MRN: [id number]  CC: Hypertension   HPI Angela Gardner  is a 59 year old female with a history of hypertension, stage III chronic kidney disease, diastolic CHF (EF 60 to 123456 from 07/2018), Endometrial carcinoma diagnosed in 09/2017 s/p total abdominal hysterectomy and bilateral salpingo-oophorectomy on 11/14/2017, status post radiation and chemotherapy who presents today for follow-up visit  She reports doing well and has been compliant with her antihypertensives; her blood pressure is elevated at 130/114 but this is an improvement from previous visits.  She does not exercise regularly and has been more sedentary due to the pandemic.  Nonadherent with a low-sodium, DASH diet.   She denies presence of chest pain, dyspnea, pedal edema and last Cardiology visit was in 04/2019 -notes reviewed; no regimen change at that visit. Her appointment with her GYN oncologist, Dr Denman George comes up next month and she saw Dr Luretha Rued of Rad Oncology was on 03/2019. CT abdomen and pelvis from 05/2018 revealed: IMPRESSION: 1. Two upper normal sized right pelvic lymph nodes are observed but are not overtly pathologically enlarged. One of these is actually slightly smaller than on 12/22/2017. 2.  Aortic Atherosclerosis (ICD10-I70.0). 3. Degenerative disc disease at L4-5. 4. Mild cardiomegaly.  Past Medical History:  Diagnosis Date  . Anemia   . 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 CHF (congestive heart failure) ([place])   . Dyspnea    still having this and not moving around alot -gets worse with exertion  . History of sleep walking   . Hx of left breast biopsy   . Hyperlipemia   . Hypertension    not on any medication now-[facility] had  prescribed her a med and she stopped taking them cannot remember when    Past Surgical History:  Procedure Laterality Date  . BREAST BIOPSY     left breast  . CESAREAN SECTION     one    . CHOLECYSTECTOMY    . DILATION AND CURETTAGE OF UTERUS     approx 1 year ago  . IR IMAGING GUIDED PORT INSERTION  12/06/2017  . IR REMOVAL TUN ACCESS W/ PORT W/O FL MOD SED  06/19/2018  . LYMPH NODE BIOPSY N/A 11/14/2017   Procedure: SENTINEL LYMPH NODE BIOPSY;  Surgeon: Everitt Amber, MD;  [facility];  Service: Gynecology;  Laterality: N/A;  . ROBOTIC ASSISTED TOTAL HYSTERECTOMY WITH BILATERAL SALPINGO OOPHERECTOMY N/A 11/14/2017   Procedure: XI ROBOTIC ASSISTED TOTAL HYSTERECTOMY WITH BILATERAL SALPINGO OOPHORECTOMY, RIGHT PELVIC LYMPHADENECTOMY;  Surgeon: Everitt Amber, MD;  [facility];  Service: Gynecology;  Laterality: N/A;    Family History  Problem Relation Age of Onset  . Anuerysm Mother   . Congestive Heart Failure Mother   . Hypertension Father   . Hypertension Sister   . Hypertension Brother   . Colon cancer Neg Hx     Allergies  Allergen Reactions  . Vicodin [Hydrocodone-Acetaminophen] Itching    Outpatient Medications Prior to Visit  Medication Sig Dispense Refill  . acetaminophen (TYLENOL) 500 MG tablet Take 1,000 mg by mouth every 6 (six) hours as needed for moderate pain.     Marland Kitchen amLODipine (NORVASC) 10 MG tablet Take 1 tablet (10 mg total) by mouth daily. 90 tablet 1  . furosemide (LASIX) 40 MG tablet Take 1 tablet (40 mg total) by mouth 2 (two) times daily. 180 tablet 1  . hydrALAZINE (APRESOLINE) 50 MG tablet  Take 1 tablet (50 mg total) by mouth 3 (three) times daily. 270 tablet 1  . magic mouthwash w/lidocaine SOLN Take 5 mLs by mouth 4 (four) times daily as needed for mouth pain. Swish and spit 140 mL 0  . metoprolol tartrate (LOPRESSOR) 25 MG tablet Take 1 tablet (25 mg total) by mouth 2 (two) times daily. 90 tablet 1  . Vitamin D, Ergocalciferol, (DRISDOL) 1.25 MG (50000 UT) CAPS capsule TAKE 1 CAPSULE BY MOUTH EVERY 7 DAYS 12 capsule 0   No facility-administered medications prior to visit.     ROS Review of Systems  Constitutional: Negative for  activity change, appetite change and fatigue.  HENT: Negative for congestion, sinus pressure and sore throat.   Eyes: Negative for visual disturbance.  Respiratory: Negative for cough, chest tightness, shortness of breath and wheezing.   Cardiovascular: Negative for chest pain and palpitations.  Gastrointestinal: Negative for abdominal distention, abdominal pain and constipation.  Endocrine: Negative for polydipsia.  Genitourinary: Negative for dysuria and frequency.  Musculoskeletal: Negative for arthralgias and back pain.  Skin: Negative for rash.  Neurological: Negative for tremors, light-headedness and numbness.  Hematological: Does not bruise/bleed easily.  Psychiatric/Behavioral: Negative for agitation and behavioral problems.    Objective:  BP (!) 130/114   Pulse 78   Ht 5\' 4"  (1.626 m)   Wt 254 lb (115.2 kg)   LMP  (LMP Unknown) Comment: vaginal bleeding continuously for last 8 months  SpO2 94%   BMI 43.60 kg/m   BP/Weight 07/02/2019 04/22/2019 [id number]  Systolic BP [id number]  Diastolic BP [id number] 77  Wt. (Lbs) 254 - -  BMI 43.6 - 42.57      Physical Exam Constitutional:      Appearance: She is well-developed. She is obese.  Neck:     Vascular: No JVD.  Cardiovascular:     Rate and Rhythm: Normal rate.     Heart sounds: Normal heart sounds. No murmur.  Pulmonary:     Effort: Pulmonary effort is normal.     Breath sounds: Normal breath sounds. No wheezing or rales.  Chest:     Chest wall: No tenderness.  Abdominal:     General: Bowel sounds are normal. There is no distension.     Palpations: Abdomen is soft. There is no mass.     Tenderness: There is no abdominal tenderness.  Musculoskeletal:        General: Normal range of motion.     Right lower leg: No edema.     Left lower leg: No edema.  Neurological:     Mental Status: She is alert and oriented to person, place, and time.  Psychiatric:        Mood and Affect: Mood normal.     CMP Latest Ref  Rng & Units 08/06/2018 07/03/2018 06/19/2018  Glucose 65 - 99 mg/dL 101(H) 105(H) 103(H)  BUN 6 - 24 mg/dL 25(H) 23 23(H)  Creatinine 0.57 - 1.00 mg/dL 1.51(H) 1.72(H) 1.50(H)  Sodium 134 - 144 mmol/L 140 145(H) 141  Potassium 3.5 - 5.2 mmol/L 3.8 3.8 3.3(L)  Chloride 96 - 106 mmol/L 101 107(H) 105  CO2 20 - 29 mmol/L 23 23 26   Calcium 8.7 - 10.2 mg/dL 9.5 9.7 9.4  Total Protein 6.0 - 8.5 g/dL - 6.9 -  Total Bilirubin 0.0 - 1.2 mg/dL - 0.3 -  Alkaline Phos 39 - 117 IU/L - 94 -  AST 0 - 40 IU/L - 15 -  ALT 0 -  32 IU/L - 11 -    Lipid Panel     Component Value Date/Time   CHOL 228 (H) 03/30/2009 2039   TRIG 139 03/30/2009 2039   HDL 67 03/30/2009 2039   CHOLHDL 3.4 Ratio 03/30/2009 2039   VLDL 28 03/30/2009 2039   LDLCALC 133 (H) 03/30/2009 2039    CBC    Component Value Date/Time   WBC 3.4 (L) 06/19/2018 1021   RBC 3.30 (L) 06/19/2018 1021   HGB 10.3 (L) 06/19/2018 1021   HGB 9.6 (L) 05/28/2018 1135   HCT 32.7 (L) 06/19/2018 1021   PLT 238 06/19/2018 1021   PLT 152 05/28/2018 1135   MCV 99.1 06/19/2018 1021   MCH 31.2 06/19/2018 1021   MCHC 31.5 06/19/2018 1021   RDW 14.6 06/19/2018 1021   LYMPHSABS 1.4 06/19/2018 1021   MONOABS 0.4 06/19/2018 1021   EOSABS 0.0 06/19/2018 1021   BASOSABS 0.0 06/19/2018 1021    Lab Results  Component Value Date   HGBA1C 6.0 07/03/2018    Assessment & Plan:   1. Essential hypertension Uncontrolled with diastolic elevation Increase metoprolol dose Counseled on blood pressure goal of less than 130/80, low-sodium, DASH diet, medication compliance, 150 minutes of moderate intensity exercise per week. Discussed medication compliance, adverse effects. - metoprolol tartrate (LOPRESSOR) 50 MG tablet; Take 1 tablet (50 mg total) by mouth 2 (two) times daily.  Dispense: 180 tablet; Refill: 1 - Complete Metabolic Panel with GFR - Lipid panel - amLODipine (NORVASC) 10 MG tablet; Take 1 tablet (10 mg total) by mouth daily.  Dispense: 90  tablet; Refill: 1 - hydrALAZINE (APRESOLINE) 50 MG tablet; Take 1 tablet (50 mg total) by mouth 3 (three) times daily.  Dispense: 270 tablet; Refill: 1  2. Chronic diastolic congestive heart failure, NYHA class 3 (HCC) EF 60 to 65% Euvolemic Continue hydralazine, beta-blocker, Lasix - Lipid panel - furosemide (LASIX) 40 MG tablet; Take 1 tablet (40 mg total) by mouth 2 (two) times daily.  Dispense: 180 tablet; Refill: 1  3. Screening for colon cancer - Ambulatory referral to Gastroenterology  4. Morbid obesity ([place]) Counseled on 150 minutes of exercise per week, healthy eating (including decreased daily intake of saturated fats, cholesterol, added sugars, sodium)     Charlott Rakes, MD, FAAFP. [facility] [place], [place]   07/02/2019, 10:58 AM

## 2019-07-03 ENCOUNTER — Other Ambulatory Visit: Payer: Self-pay | Admitting: Family Medicine

## 2019-07-03 ENCOUNTER — Encounter: Payer: Self-pay | Admitting: Gastroenterology

## 2019-07-03 LAB — CMP14+EGFR
ALT: 14 IU/L (ref 0–32)
AST: 13 IU/L (ref 0–40)
Albumin/Globulin Ratio: 1.6 (ref 1.2–2.2)
Albumin: 4.1 g/dL (ref 3.8–4.9)
Alkaline Phosphatase: 112 IU/L (ref 39–117)
BUN/Creatinine Ratio: 13 (ref 9–23)
BUN: 17 mg/dL (ref 6–24)
Bilirubin Total: 0.3 mg/dL (ref 0.0–1.2)
CO2: 23 mmol/L (ref 20–29)
Calcium: 9.5 mg/dL (ref 8.7–10.2)
Chloride: 108 mmol/L — ABNORMAL HIGH (ref 96–106)
Creatinine, Ser: 1.36 mg/dL — ABNORMAL HIGH (ref 0.57–1.00)
GFR calc Af Amer: 50 mL/min/{1.73_m2} — ABNORMAL LOW (ref >59)
GFR calc non Af Amer: 43 mL/min/{1.73_m2} — ABNORMAL LOW (ref >59)
Globulin, Total: 2.5 g/dL (ref 1.5–4.5)
Glucose: 96 mg/dL (ref 65–99)
Potassium: 4.1 mmol/L (ref 3.5–5.2)
Sodium: 143 mmol/L (ref 134–144)
Total Protein: 6.6 g/dL (ref 6.0–8.5)

## 2019-07-03 LAB — LIPID PANEL
Chol/HDL Ratio: 3.2 ratio (ref 0.0–4.4)
Cholesterol, Total: 248 mg/dL — ABNORMAL HIGH (ref 100–199)
HDL: 78 mg/dL (ref >39)
LDL Chol Calc (NIH): 155 mg/dL — ABNORMAL HIGH (ref 0–99)
Triglycerides: 88 mg/dL (ref 0–149)
VLDL Cholesterol Cal: 15 mg/dL (ref 5–40)

## 2019-07-03 MED ORDER — ATORVASTATIN CALCIUM 40 MG PO TABS: 20.0000 mg | ORAL_TABLET | Freq: Every day | ORAL | 5 refills | Status: DC

## 2019-07-03 MED FILL — ATORVASTATIN CALCIUM 40 MG: 40 | 30 days supply | Qty: 30 | Fill #0

## 2019-07-04 ENCOUNTER — Telehealth: Payer: Self-pay

## 2019-07-04 NOTE — Telephone Encounter (Signed)
Patient viewed results at 1:56 on 07/03/19

## 2019-07-04 NOTE — Telephone Encounter (Signed)
-----   Message from Charlott Rakes, MD sent at 07/03/2019  9:45 AM EST ----- Can you please relay the my chart message to the patient? Thanks

## 2019-07-09 ENCOUNTER — Inpatient Hospital Stay: Payer: Medicaid Other | Attending: Gynecologic Oncology | Admitting: Gynecologic Oncology

## 2019-07-09 ENCOUNTER — Encounter: Payer: Self-pay | Admitting: Gynecologic Oncology

## 2019-07-09 ENCOUNTER — Other Ambulatory Visit: Payer: Self-pay

## 2019-07-09 VITALS — BP 224/124 | HR 75 | Temp 98.3°F | Resp 20 | Ht 65.0 in | Wt 257.0 lb

## 2019-07-09 DIAGNOSIS — Z90722 Acquired absence of ovaries, bilateral: Secondary | ICD-10-CM

## 2019-07-09 DIAGNOSIS — I110 Hypertensive heart disease with heart failure: Secondary | ICD-10-CM | POA: Diagnosis not present

## 2019-07-09 DIAGNOSIS — Z9221 Personal history of antineoplastic chemotherapy: Secondary | ICD-10-CM

## 2019-07-09 DIAGNOSIS — Z79899 Other long term (current) drug therapy: Secondary | ICD-10-CM | POA: Insufficient documentation

## 2019-07-09 DIAGNOSIS — Z79818 Long term (current) use of other agents affecting estrogen receptors and estrogen levels: Secondary | ICD-10-CM | POA: Insufficient documentation

## 2019-07-09 DIAGNOSIS — E7800 Pure hypercholesterolemia, unspecified: Secondary | ICD-10-CM | POA: Insufficient documentation

## 2019-07-09 DIAGNOSIS — E669 Obesity, unspecified: Secondary | ICD-10-CM | POA: Insufficient documentation

## 2019-07-09 DIAGNOSIS — Z87891 Personal history of nicotine dependence: Secondary | ICD-10-CM | POA: Diagnosis not present

## 2019-07-09 DIAGNOSIS — E785 Hyperlipidemia, unspecified: Secondary | ICD-10-CM | POA: Diagnosis not present

## 2019-07-09 DIAGNOSIS — C541 Malignant neoplasm of endometrium: Secondary | ICD-10-CM | POA: Diagnosis not present

## 2019-07-09 DIAGNOSIS — Z90710 Acquired absence of both cervix and uterus: Secondary | ICD-10-CM | POA: Diagnosis not present

## 2019-07-09 DIAGNOSIS — Z923 Personal history of irradiation: Secondary | ICD-10-CM

## 2019-07-09 NOTE — Patient Instructions (Signed)
Please notify Dr Denman George at phone number [phone] if you notice vaginal bleeding, new pelvic or abdominal pains, bloating, feeling full easy, or a change in bladder or bowel function.   Please have Dr Sondra Come contact Dr Serita Grit office (at [phone]) in April to request an appointment with her for July, 2021.

## 2019-07-09 NOTE — Progress Notes (Signed)
Follow-up Note: Gyn-Onc  Consult was requested by Dr. Hulan Fray for the evaluation of Angela Gardner 59 y.o. female  CC:  Chief Complaint  Patient presents with  . Endometrial cancer [facility])    follow up    Assessment/Plan:  Angela Gardner  is a 59 y.o.  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 High risk factors for recurrence given the unfavorable cell type.  S/p adjuvant therapy with 6cycles of carb/taxol and vaginal brachytherapy completed November, 2019.  No evidence of recurrence on examination. Recommend follow-up with Dr Sondra Come in 3 months and myself in 6 months.   HPI: Angela Gardner is a 59 year old P1 who is seen in consultation at the request of Dr Hulan Fray for grade 3 endometrial cancer.   The patient transitioned through menopause prior to age 39.  She had an episode of postmenopausal bleeding in approximately 2017 at which time she had a D&C procedure revealing benign pathology.  She stopped vaginal bleeding until approximately November 2018 when she began having heavy menstrual-like bleeding and was started on Megace.  This helped some however bleeding persisted and therefore she underwent a transvaginal ultrasound scan on 09/15/2017 this revealed a uterus measuring 11.2 x 5 x 6.9 cm with a subserosal fundal fibroid measuring up to 3.8 cm.  The endometrial thickness was 7 mm with concern for an echogenic mass centrally within the endometrium measuring 3.8 x 3.8 x 3.6 cm.  She then underwent an endometrial Pipelle biopsy on 10/09/2017 which revealed high-grade adenocarcinoma.  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  Due to the finding of a high-grade endometrial cancer that she underwent a CT scan of the chest abdomen and pelvis on September 12, 2017 this revealed grossly unremarkable uterus and ovaries, no lymphadenopathy, no gross evidence of extrauterine disease or  metastases.  The patient otherwise is obese with hypertension and hypercholesterolemia.  She has had one prior pregnancy which resulted in a cesarean delivery.  She has had an open cholecystectomy.  On 11/14/17 she underwent robotically assisted total hysterectomy, BSO, SLN biopsy. Postoperatively her recovery was complicated by dizziness, dehydration and azotemia requiring an additional night's stay in the hospital. This resolved with hydration. Preop urine tox screen was positive for cocaine. .  Final pathology revealed a 2.3cm high grade serous endometrial cancer with sarcomatoid features. The carcinoma invades 0.5cm of 3cm in myometrium. Lymph nodes (left sentinel, right pelvic) were negative. No LVSI was present. No cervical or adnexal involvement. She was determined to have stage IA high grade serous carcinoma of the uterus and was recommended to receive adjuvant therapy in the form of 6 cycles of carboplatin and paclitaxel and vaginal brachytherapy in accordance with NCCN guidelines.   On 12/21/17 the patient began experiencing leakage of clear fluid from the vagina. She was seen in the [place] ER on 12/22/17 and a CT urogram with retrograde cystogram was performed. It showed no extravasation of contrast around the kidneys, from the ureters or around the vagina. There was a 2cm collection of fluid adjacent to the vagina. No definitive fistula/leak was identified. The leakage of fluid spontaneously resolved.   She went on to complete 6 cycles of carboplatin paclitaxel chemotherapy with Dr. go such between June and November 2019.  She also completed 30 Gy of vaginal brachii therapy with Dr. Sondra Come in 5 fractions completed in November 2019.  Interval Hx:  She  has no complaints and denies persistent toxicity of therapy.    Current Meds:  Outpatient Encounter Medications as of 07/09/2019  Medication Sig  . acetaminophen (TYLENOL) 500 MG tablet Take 1,000 mg by mouth every 6 (six) hours as  needed for moderate pain.   Marland Kitchen amLODipine (NORVASC) 10 MG tablet Take 1 tablet (10 mg total) by mouth daily.  Marland Kitchen atorvastatin (LIPITOR) 40 MG tablet Take 0.5 tablets (20 mg total) by mouth daily.  . furosemide (LASIX) 40 MG tablet Take 1 tablet (40 mg total) by mouth 2 (two) times daily.  . hydrALAZINE (APRESOLINE) 50 MG tablet Take 1 tablet (50 mg total) by mouth 3 (three) times daily.  . magic mouthwash w/lidocaine SOLN Take 5 mLs by mouth 4 (four) times daily as needed for mouth pain. Swish and spit  . metoprolol tartrate (LOPRESSOR) 50 MG tablet Take 1 tablet (50 mg total) by mouth 2 (two) times daily.  . Vitamin D, Ergocalciferol, (DRISDOL) 1.25 MG (50000 UT) CAPS capsule TAKE 1 CAPSULE BY MOUTH EVERY 7 DAYS  . [DISCONTINUED] prochlorperazine (COMPAZINE) 10 MG tablet Take 1 tablet (10 mg total) by mouth every 6 (six) hours as needed (Nausea or vomiting). (Patient not taking: Reported on 05/25/2018)   No facility-administered encounter medications on file as of 07/09/2019.    Allergy:  Allergies  Allergen Reactions  . Vicodin [Hydrocodone-Acetaminophen] Itching    Social Hx:   Social History   Socioeconomic History  . Marital status: Single    Spouse name: Not on file  . Number of children: 1  . Years of education: Not on file  . Highest education level: 9th grade  Occupational History  . Not on file  Tobacco Use  . Smoking status: Former Smoker    Packs/day: 0.30    Years: 26.00    Pack years: 7.80    Types: Cigarettes  . Smokeless tobacco: Never Used  . Tobacco comment: quit May 2019  Substance and Sexual Activity  . Alcohol use: Yes    Comment: social drinking  . Drug use: Yes    Types: Cocaine, "Crack" cocaine    Comment: 02/03/19  . Sexual activity: Not Currently    Birth control/protection: Post-menopausal  Other Topics Concern  . Not on file  Social History Narrative  . Not on file   Social Determinants of Health   Financial Resource Strain:   . Difficulty  of Paying Living Expenses: Not on file  Food Insecurity:   . Worried About Charity fundraiser in the Last Year: Not on file  . Ran Out of Food in the Last Year: Not on file  Transportation Needs: No Transportation Needs  . Lack of Transportation (Medical): No  . Lack of Transportation (Non-Medical): No  Physical Activity:   . Days of Exercise per Week: Not on file  . Minutes of Exercise per Session: Not on file  Stress:   . Feeling of Stress : Not on file  Social Connections:   . Frequency of Communication with Friends and Family: Not on file  . Frequency of Social Gatherings with Friends and Family: Not on file  . Attends Religious Services: Not on file  . Active Member of Clubs or Organizations: Not on file  . Attends Archivist Meetings: Not on file  . Marital Status: Not on file  Intimate Partner Violence:   . Fear of Current or Ex-Partner: Not on file  . Emotionally Abused: Not on file  . Physically Abused: Not  on file  . Sexually Abused: Not on file    Past Surgical Hx:  Past Surgical History:  Procedure Laterality Date  . BREAST BIOPSY     left breast  . CESAREAN SECTION     one  . CHOLECYSTECTOMY    . DILATION AND CURETTAGE OF UTERUS     approx 1 year ago  . IR IMAGING GUIDED PORT INSERTION  12/06/2017  . IR REMOVAL TUN ACCESS W/ PORT W/O FL MOD SED  06/19/2018  . LYMPH NODE BIOPSY N/A 11/14/2017   Procedure: SENTINEL LYMPH NODE BIOPSY;  Surgeon: Everitt Amber, MD;  Location: [facility];  Service: Gynecology;  Laterality: N/A;  . ROBOTIC ASSISTED TOTAL HYSTERECTOMY WITH BILATERAL SALPINGO OOPHERECTOMY N/A 11/14/2017   Procedure: XI ROBOTIC ASSISTED TOTAL HYSTERECTOMY WITH BILATERAL SALPINGO OOPHORECTOMY, RIGHT PELVIC LYMPHADENECTOMY;  Surgeon: Everitt Amber, MD;  Location: [facility];  Service: Gynecology;  Laterality: N/A;    Past Medical Hx:  Past Medical History:  Diagnosis Date  . Anemia   . 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 CHF (congestive heart  failure) ([place])   . Dyspnea    still having this and not moving around alot -gets worse with exertion  . History of sleep walking   . Hx of left breast biopsy   . Hyperlipemia   . Hypertension    not on any medication now-[facility] had  prescribed her a med and she stopped taking them cannot remember when    Past Gynecological History:  C/s x 1, positive high risk HPV in 2019. No LMP recorded (lmp unknown). Patient has had a hysterectomy.  Family Hx:  Family History  Problem Relation Age of Onset  . Anuerysm Mother   . Congestive Heart Failure Mother   . Hypertension Father   . Hypertension Sister   . Hypertension Brother   . Colon cancer Neg Hx     Review of Systems:  Constitutional  Feels well,    ENT Normal appearing ears and nares bilaterally Skin/Breast  No rash, sores, jaundice, itching, dryness Cardiovascular  No chest pain, shortness of breath, or edema  Pulmonary  No cough or wheeze.  Gastro Intestinal  No nausea, vomitting, or diarrhoea. No bright red blood per rectum, no abdominal pain, change in bowel movement, or constipation.  Genito Urinary  No frequency, urgency, dysuria, no leakage of blood or fluid.    Musculo Skeletal  No myalgia, arthralgia, joint swelling or pain  Neurologic  No weakness, numbness, change in gait,  Psychology  No depression, anxiety, insomnia.   Vitals:  Blood pressure (!) 224/124, pulse 75, temperature 98.3 F (36.8 C), temperature source Temporal, resp. rate 20, height 5\' 5"  (1.651 m), weight 257 lb (116.6 kg), SpO2 99 %.  Physical Exam: WD in NAD Neck  Supple NROM, without any enlargements.  Lymph Node Survey No cervical supraclavicular or inguinal adenopathy Cardiovascular  Pulse normal rate, regularity and rhythm. S1 and S2 normal.  Lungs  Clear to auscultation bilateraly, without wheezes/crackles/rhonchi. Good air movement.  Skin  No rash/lesions/breakdown  Psychiatry  Alert and oriented to person, place, and  time  Abdomen  Normoactive bowel sounds, abdomen soft, non-tender and obese without evidence of hernia. Incisions are well healed and soft.  Back No CVA tenderness Genito Urinary  Well healed vaginal cuff, no lesions, no masses, no blood, no drainage. No pelvic masses.  Rectal  deferred Extremities  + subtle left lower extremity edema   Thereasa Solo, MD  07/09/2019, 3:38 PM

## 2019-08-27 ENCOUNTER — Telehealth: Payer: Self-pay

## 2019-08-27 NOTE — Telephone Encounter (Signed)
Told Angela Gardner  That the orders for the incontinence supplies needs to be managed by her PCP Dr. Charlott Rakes.  Dr. Denman George does not manage these orders. Pt verbalized understanding.

## 2019-09-22 NOTE — Progress Notes (Signed)
Radiation Oncology         [phone] ________________________________  Name: Angela Gardner MRN: [id number]  Date: 09/23/2019  DOB: 02-Nov-1960  Follow-Up Visit Note  CC: Charlott Rakes, MD  Everitt Amber, MD    ICD-10-CM   1. Endometrial cancer (HCC)  C54.1     Diagnosis: Stage IA grade 3 serous endometrial cancer, high-grade with sarcomatoid features  Interval Since Last Radiation: One year, seven months, and one day.  01/16/2018 - 02/21/2018: Vaginal Cuff / 30 Gy in 5 fractions of 6 Gy  Narrative:  The patient returns today for routine follow-up. She last saw Dr. Denman George on 07/09/2019 with no evidence of recurrence on examination.  On review of systems, she reports multiple falls and head pain from falling.  Denies passing out prior to these events.  Recommended she discuss these issues further with her primary care physician. she denies dysuria, hematuria, vaginal bleeding/discharge, rectal bleeding, diarrhea, constipation, abdominal bloating, nausea, and vomiting.  She is using her vaginal dilator approximately 1 time per week   ALLERGIES:  is allergic to vicodin [hydrocodone-acetaminophen].  Meds: Current Outpatient Medications  Medication Sig Dispense Refill  . acetaminophen (TYLENOL) 500 MG tablet Take 1,000 mg by mouth every 6 (six) hours as needed for moderate pain.     Marland Kitchen amLODipine (NORVASC) 10 MG tablet Take 1 tablet (10 mg total) by mouth daily. 90 tablet 1  . atorvastatin (LIPITOR) 40 MG tablet Take 0.5 tablets (20 mg total) by mouth daily. 30 tablet 5  . furosemide (LASIX) 40 MG tablet Take 1 tablet (40 mg total) by mouth 2 (two) times daily. 180 tablet 1  . hydrALAZINE (APRESOLINE) 50 MG tablet Take 1 tablet (50 mg total) by mouth 3 (three) times daily. 270 tablet 1  . magic mouthwash w/lidocaine SOLN Take 5 mLs by mouth 4 (four) times daily as needed for mouth pain. Swish and spit 140 mL 0  . metoprolol tartrate (LOPRESSOR) 50 MG tablet Take 1 tablet (50 mg  total) by mouth 2 (two) times daily. 180 tablet 1  . Vitamin D, Ergocalciferol, (DRISDOL) 1.25 MG (50000 UT) CAPS capsule TAKE 1 CAPSULE BY MOUTH EVERY 7 DAYS 12 capsule 0   No current facility-administered medications for this encounter.    Physical Findings: The patient is in no acute distress. Patient is alert and oriented.  temporal temperature is 98.5 F (36.9 C). Her blood pressure is 187/88 (abnormal) and her pulse is 67. Her oxygen saturation is 95%.   Lungs are clear to auscultation bilaterally. Heart has regular rate and rhythm. No palpable cervical, supraclavicular, or axillary adenopathy. Abdomen soft, non-tender, normal bowel sounds. On pelvic examination the external genitalia were unremarkable. A speculum exam was performed. There are no mucosal lesions noted in the vaginal vault. On bimanual  examination there were no pelvic masses appreciated. Vaginal cuff intact.  The pupils are equal round reactive to light.  Extraocular movements are intact.  The patient has mild swelling/bruising in the left frontal area from her recent fall  Lab Findings: Lab Results  Component Value Date   WBC 3.4 (L) 06/19/2018   HGB 10.3 (L) 06/19/2018   HCT 32.7 (L) 06/19/2018   MCV 99.1 06/19/2018   PLT 238 06/19/2018    Radiographic Findings: No results found.  Impression: Stage IA grade 3 serous endometrial cancer, high-grade with sarcomatoid features.   No evidence of recurrence on clinical exam.   Plan:  The patient will follow-up with Dr. Denman George in three months and  with radiation oncology in six months.  ____________________________________  Blair Promise, PhD, MD  This document serves as a record of services personally performed by Gery Pray, MD. It was created on his behalf by Clerance Lav, a trained medical scribe. The creation of this record is based on the scribe's personal observations and the provider's statements to them. This document has been checked and approved by  the attending provider.

## 2019-09-23 ENCOUNTER — Other Ambulatory Visit: Payer: Self-pay

## 2019-09-23 ENCOUNTER — Encounter: Payer: Self-pay | Admitting: Radiation Oncology

## 2019-09-23 ENCOUNTER — Ambulatory Visit
Admission: RE | Admit: 2019-09-23 | Discharge: 2019-09-23 | Disposition: A | Discharge status: Home / Self Care | Payer: Medicaid Other | Source: Ambulatory Visit | Attending: Radiation Oncology | Admitting: Radiation Oncology

## 2019-09-23 VITALS — BP 187/88 | HR 67 | Temp 98.5°F

## 2019-09-23 DIAGNOSIS — R296 Repeated falls: Secondary | ICD-10-CM | POA: Diagnosis not present

## 2019-09-23 DIAGNOSIS — Z8542 Personal history of malignant neoplasm of other parts of uterus: Secondary | ICD-10-CM | POA: Insufficient documentation

## 2019-09-23 DIAGNOSIS — C541 Malignant neoplasm of endometrium: Secondary | ICD-10-CM

## 2019-09-23 DIAGNOSIS — R519 Headache, unspecified: Secondary | ICD-10-CM | POA: Diagnosis not present

## 2019-09-23 DIAGNOSIS — Z79899 Other long term (current) drug therapy: Secondary | ICD-10-CM | POA: Diagnosis not present

## 2019-09-23 NOTE — Progress Notes (Signed)
Ms. Boehnlein presents today for f/u with Dr. Sondra Come. Pt reports multiple falls, most recently hit head. Rates pain in head 4/10. Pt reports sleeping a lot R/T boredom. Pt requests SW to reach out to her re: finding new housing. Pt denies dysuria/hematuria. Pt denies vaginal bleeding/discharge. Pt denies rectal bleeding, diarrhea/constipation. Pt denies abdominal bloating, N/V.   BP (!) 187/88 (BP Location: Right Arm, Patient Position: Sitting)   Pulse 67   Temp 98.5 F (36.9 C) (Temporal)   LMP  (LMP Unknown) Comment: vaginal bleeding continuously for last 8 months  SpO2 95%   Wt Readings from Last 3 Encounters:  07/09/19 257 lb (116.6 kg)  07/02/19 254 lb (115.2 kg)  02/04/19 248 lb (112.5 kg)   Loma Sousa, RN BSN

## 2019-09-23 NOTE — Patient Instructions (Signed)
Coronavirus (COVID-19) Are you at risk?  Are you at risk for the Coronavirus (COVID-19)?  To be considered HIGH RISK for Coronavirus (COVID-19), you have to meet the following criteria:  . Traveled to [place], [place], [place], [place] or [place]; or in the [place] to [place], [place], [place], or [place]; and have fever, cough, and shortness of breath within the last 2 weeks of travel OR . Been in close contact with a person diagnosed with COVID-19 within the last 2 weeks and have fever, cough, and shortness of breath . IF YOU DO NOT MEET THESE CRITERIA, YOU ARE CONSIDERED LOW RISK FOR COVID-19.  What to do if you are HIGH RISK for COVID-19?  . If you are having a medical emergency, call 911. . Seek medical care right away. Before you go to a doctor's office, urgent care or emergency department, call ahead and tell them about your recent travel, contact with someone diagnosed with COVID-19, and your symptoms. You should receive instructions from your physician's office regarding next steps of care.  . When you arrive at healthcare provider, tell the healthcare staff immediately you have returned from visiting [place], [place], [place], [place] or [place]; or traveled in the [place] to [place], [place], [place], or [place]; in the last two weeks or you have been in close contact with a person diagnosed with COVID-19 in the last 2 weeks.   . Tell the health care staff about your symptoms: fever, cough and shortness of breath. . After you have been seen by a medical provider, you will be either: o Tested for (COVID-19) and discharged home on quarantine except to seek medical care if symptoms worsen, and asked to  - Stay home and avoid contact with others until you get your results (4-5 days)  - Avoid travel on public transportation if possible (such as bus, train, or airplane) or o Sent to the Emergency Department by EMS for evaluation, COVID-19 testing, and possible  admission depending on your condition and test results.  What to do if you are LOW RISK for COVID-19?  Reduce your risk of any infection by using the same precautions used for avoiding the common cold or flu:  . Wash your hands often with soap and warm water for at least 20 seconds.  If soap and water are not readily available, use an alcohol-based hand sanitizer with at least 60% alcohol.  . If coughing or sneezing, cover your mouth and nose by coughing or sneezing into the elbow areas of your shirt or coat, into a tissue or into your sleeve (not your hands). . Avoid shaking hands with others and consider head nods or verbal greetings only. . Avoid touching your eyes, nose, or mouth with unwashed hands.  . Avoid close contact with people who are sick. . Avoid places or events with large numbers of people in one location, like concerts or sporting events. . Carefully consider travel plans you have or are making. . If you are planning any travel outside or inside the [place], visit the CDC's Travelers' Health webpage for the latest health notices. . If you have some symptoms but not all symptoms, continue to monitor at home and seek medical attention if your symptoms worsen. . If you are having a medical emergency, call 911.   ADDITIONAL HEALTHCARE OPTIONS FOR PATIENTS  [facility] / e-Visit: [email or web address]         [facility]: [phone]  [facility]: [phone]                   [facility]: [phone]   

## 2019-09-25 ENCOUNTER — Telehealth: Payer: Self-pay | Admitting: Family Medicine

## 2019-09-25 ENCOUNTER — Encounter: Payer: Self-pay | Admitting: *Deleted

## 2019-09-25 ENCOUNTER — Ambulatory Visit: Payer: Medicaid Other | Admitting: Nurse Practitioner

## 2019-09-25 NOTE — Telephone Encounter (Signed)
Pt called stating she cant get to [facility] for her colonoscopies can we do it here in [place]

## 2019-09-25 NOTE — Telephone Encounter (Signed)
I spoke to patient  and the reason she was sending to [facility]  it was because [facility]  said tat she was establish with [facility]  but she said that she never was seen there.  I sent the referral back to [place]

## 2019-09-25 NOTE — Progress Notes (Signed)
[facility]  Clinical Social Work was referred by radiation Museum/gallery curator for assessment of psychosocial needs.  Clinical Social Worker contacted patient by phone  to offer support and assess for needs.  [name] reported she is living in a trailer with her sister and the floors are very weak and is concerned the floor will fall in.  She indicated several other concerns regarding the condition of the trailer.  She stated they rent the lot for $250/month.  The patient is interested in getting the trailer repaired or finding new suitable housing.  CSW referred patient to [employer] via [place] to explore resources or new housing.      Gwinda Maine, LCSW  Clinical Social Worker [facility]

## 2019-10-15 ENCOUNTER — Ambulatory Visit: Payer: Medicaid Other | Admitting: Nurse Practitioner

## 2019-10-24 ENCOUNTER — Other Ambulatory Visit: Payer: Self-pay | Admitting: Family Medicine

## 2019-10-24 NOTE — Telephone Encounter (Signed)
Last Vit D checked, per chart, on 05/28/18. Will forward request to PCP.

## 2019-10-30 ENCOUNTER — Other Ambulatory Visit: Payer: Self-pay | Admitting: Family Medicine

## 2019-10-30 ENCOUNTER — Telehealth: Payer: Self-pay | Admitting: Family Medicine

## 2019-10-30 DIAGNOSIS — Z1211 Encounter for screening for malignant neoplasm of colon: Secondary | ICD-10-CM

## 2019-10-30 NOTE — Telephone Encounter (Signed)
Angela Gardner is requesting a new Referral the first one  Was  closed per patient refuse and now she wants to proceed with referral . Thank You .

## 2019-10-31 NOTE — Telephone Encounter (Signed)
Thank You.

## 2019-10-31 NOTE — Telephone Encounter (Signed)
Referral has been placed. 

## 2019-11-13 ENCOUNTER — Telehealth: Payer: Self-pay | Admitting: *Deleted

## 2019-11-13 NOTE — Telephone Encounter (Signed)
CALLED PATIENT TO INFORM OF FU APPT. WITH DR. Denman George ON 01-06-20 - ARRIVAL TIME- 1 PM, LVM FOR A RETURN CALL

## 2019-11-19 ENCOUNTER — Telehealth: Payer: Self-pay | Admitting: *Deleted

## 2019-11-19 NOTE — Telephone Encounter (Signed)
RETURNED PATIENT'S PHONE CALL, LVM FOR A RETURN CALL 

## 2019-11-22 ENCOUNTER — Ambulatory Visit: Payer: Medicaid Other | Admitting: Cardiology

## 2019-12-06 ENCOUNTER — Other Ambulatory Visit: Payer: Self-pay

## 2019-12-06 ENCOUNTER — Encounter: Payer: Self-pay | Admitting: Cardiology

## 2019-12-06 ENCOUNTER — Telehealth (INDEPENDENT_AMBULATORY_CARE_PROVIDER_SITE_OTHER): Payer: Medicaid Other | Admitting: Cardiology

## 2019-12-06 VITALS — BP 185/109

## 2019-12-06 DIAGNOSIS — C541 Malignant neoplasm of endometrium: Secondary | ICD-10-CM

## 2019-12-06 DIAGNOSIS — I5032 Chronic diastolic (congestive) heart failure: Secondary | ICD-10-CM

## 2019-12-06 DIAGNOSIS — I10 Essential (primary) hypertension: Secondary | ICD-10-CM

## 2019-12-06 NOTE — Patient Instructions (Signed)
Medication Instructions:  Your physician recommends that you continue on your current medications as directed. Please refer to the Current Medication list given to you today.  *If you need a refill on your cardiac medications before your next appointment, please call your pharmacy*   Lab Work: Your physician recommends that you return for lab work today: bmp   If you have labs (blood work) drawn today and your tests are completely normal, you will receive your results only by: . MyChart Message (if you have MyChart) OR . A paper copy in the mail If you have any lab test that is abnormal or we need to change your treatment, we will call you to review the results.   Testing/Procedures: Your physician has requested that you have an echocardiogram. Echocardiography is a painless test that uses sound waves to create images of your heart. It provides your doctor with information about the size and shape of your heart and how well your heart's chambers and valves are working. This procedure takes approximately one hour. There are no restrictions for this procedure.     Follow-Up: At [facility], you and your health needs are our priority.  As part of our continuing mission to provide you with exceptional heart care, we have created designated Provider Care Teams.  These Care Teams include your primary Cardiologist (physician) and Advanced Practice Providers (APPs -  Physician Assistants and Nurse Practitioners) who all work together to provide you with the care you need, when you need it.  We recommend signing up for the patient portal called "MyChart".  Sign up information is provided on this After Visit Summary.  MyChart is used to connect with patients for Virtual Visits (Telemedicine).  Patients are able to view lab/test results, encounter notes, upcoming appointments, etc.  Non-urgent messages can be sent to your provider as well.   To learn more about what you can do with MyChart, go to  [email or web address].    Your next appointment:   3 month(s)  The format for your next appointment:   In Person  Provider:   Robert Krasowski, MD   Other Instructions   Echocardiogram An echocardiogram is a procedure that uses painless sound waves (ultrasound) to produce an image of the heart. Images from an echocardiogram can provide important information about:  Signs of coronary artery disease (CAD).  Aneurysm detection. An aneurysm is a weak or damaged part of an artery wall that bulges out from the normal force of blood pumping through the body.  Heart size and shape. Changes in the size or shape of the heart can be associated with certain conditions, including heart failure, aneurysm, and CAD.  Heart muscle function.  Heart valve function.  Signs of a past heart attack.  Fluid buildup around the heart.  Thickening of the heart muscle.  A tumor or infectious growth around the heart valves. Tell a health care provider about:  Any allergies you have.  All medicines you are taking, including vitamins, herbs, eye drops, creams, and over-the-counter medicines.  Any blood disorders you have.  Any surgeries you have had.  Any medical conditions you have.  Whether you are pregnant or may be pregnant. What are the risks? Generally, this is a safe procedure. However, problems may occur, including:  Allergic reaction to dye (contrast) that may be used during the procedure. What happens before the procedure? No specific preparation is needed. You may eat and drink normally. What happens during the procedure?   An IV tube   may be inserted into one of your veins.  You may receive contrast through this tube. A contrast is an injection that improves the quality of the pictures from your heart.  A gel will be applied to your chest.  A wand-like tool (transducer) will be moved over your chest. The gel will help to transmit the sound waves from the  transducer.  The sound waves will harmlessly bounce off of your heart to allow the heart images to be captured in real-time motion. The images will be recorded on a computer. The procedure may vary among health care providers and hospitals. What happens after the procedure?  You may return to your normal, everyday life, including diet, activities, and medicines, unless your health care provider tells you not to do that. Summary  An echocardiogram is a procedure that uses painless sound waves (ultrasound) to produce an image of the heart.  Images from an echocardiogram can provide important information about the size and shape of your heart, heart muscle function, heart valve function, and fluid buildup around your heart.  You do not need to do anything to prepare before this procedure. You may eat and drink normally.  After the echocardiogram is completed, you may return to your normal, everyday life, unless your health care provider tells you not to do that. This information is not intended to replace advice given to you by your health care provider. Make sure you discuss any questions you have with your health care provider. Document Revised: 09/27/2018 Document Reviewed: 07/09/2016 Elsevier Patient Education  2020 Elsevier Inc.   

## 2019-12-06 NOTE — Progress Notes (Signed)
Virtual Visit via Telephone Note   This visit type was conducted due to national recommendations for restrictions regarding the COVID-19 Pandemic (e.g. social distancing) in an effort to limit this patient's exposure and mitigate transmission in our community.  Due to her co-morbid illnesses, this patient is at least at moderate risk for complications without adequate follow up.  This format is felt to be most appropriate for this patient at this time.  The patient did not have access to video technology/had technical difficulties with video requiring transitioning to audio format only (telephone).  All issues noted in this document were discussed and addressed.  No physical exam could be performed with this format.  Please refer to the patient's chart for her  consent to telehealth for [facility].  Evaluation Performed:  Follow-up visit  This visit type was conducted due to national recommendations for restrictions regarding the COVID-19 Pandemic (e.g. social distancing).  This format is felt to be most appropriate for this patient at this time.  All issues noted in this document were discussed and addressed.  No physical exam was performed (except for noted visual exam findings with Video Visits).  Please refer to the patient's chart (MyChart message for video visits and phone note for telephone visits) for the patient's consent to telehealth for [facility].  Date:  12/06/2019  ID: Angela Gardner, DOB [birth year], MRN [id number]   Patient Location: [place] [address]   Provider location:   [place] Office  PCP:  Charlott Rakes, MD  Cardiologist:  Jenne Campus, MD     Chief Complaint: Doing fine, have no problems  History of Present Illness:    Angela Gardner is a 59 y.o. female  who presents via audio/video conferencing for a telehealth visit today.  The past medical history significant for essential hypertension which is still poorly  controlled, diastolic congestive heart failure, dyspnea on exertion, swelling of lower extremities, uterine cancer.  She does have televisit with [place] today.  She was unable to establish video link therefore we talking over the phone.  She is doing well she says she is gradually getting better shortness of breath improved she does not have much swelling of lower extremities today she is very proud of herself because for the first time she was able to go to the grocery store.  Normally she goes to the mailbox and back and then she have to rest because she is so tired.   The patient does not have symptoms concerning for COVID-19 infection (fever, chills, cough, or new SHORTNESS OF BREATH).    Prior CV studies:   The following studies were reviewed today:  No recent test reviewed.  I did review her echocardiogram from January which showed severe left ventricle hypertrophy and right     Past Medical History:  Diagnosis Date  . Anemia   . 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 CHF (congestive heart failure) ([place])   . Dyspnea    still having this and not moving around alot -gets worse with exertion  . History of sleep walking   . Hx of left breast biopsy   . Hyperlipemia   . Hypertension    not on any medication now-[facility] had  prescribed her a med and she stopped taking them cannot remember when    Past Surgical History:  Procedure Laterality Date  . BREAST BIOPSY     left breast  . CESAREAN SECTION  one  . CHOLECYSTECTOMY    . DILATION AND CURETTAGE OF UTERUS     approx 1 year ago  . IR IMAGING GUIDED PORT INSERTION  12/06/2017  . IR REMOVAL TUN ACCESS W/ PORT W/O FL MOD SED  06/19/2018  . LYMPH NODE BIOPSY N/A 11/14/2017   Procedure: SENTINEL LYMPH NODE BIOPSY;  Surgeon: Everitt Amber, MD;  Location: [facility];  Service: Gynecology;  Laterality: N/A;  . ROBOTIC ASSISTED TOTAL HYSTERECTOMY WITH BILATERAL SALPINGO OOPHERECTOMY N/A 11/14/2017   Procedure: XI ROBOTIC ASSISTED  TOTAL HYSTERECTOMY WITH BILATERAL SALPINGO OOPHORECTOMY, RIGHT PELVIC LYMPHADENECTOMY;  Surgeon: Everitt Amber, MD;  Location: [facility];  Service: Gynecology;  Laterality: N/A;     Current Meds  Medication Sig  . acetaminophen (TYLENOL) 500 MG tablet Take 1,000 mg by mouth every 6 (six) hours as needed for moderate pain.   Marland Kitchen amLODipine (NORVASC) 10 MG tablet Take 1 tablet (10 mg total) by mouth daily.  Marland Kitchen atorvastatin (LIPITOR) 40 MG tablet Take 0.5 tablets (20 mg total) by mouth daily.  . furosemide (LASIX) 40 MG tablet Take 1 tablet (40 mg total) by mouth 2 (two) times daily.  . hydrALAZINE (APRESOLINE) 50 MG tablet Take 1 tablet (50 mg total) by mouth 3 (three) times daily.  . metoprolol tartrate (LOPRESSOR) 50 MG tablet Take 1 tablet (50 mg total) by mouth 2 (two) times daily.      Family History: The patient's family history includes Anuerysm in her mother; Congestive Heart Failure in her mother; Hypertension in her brother, father, and sister. There is no history of Colon cancer.   ROS:   Please see the history of present illness.     All other systems reviewed and are negative.   Labs/Other Tests and Data Reviewed:     Recent Labs: 07/02/2019: ALT 14; BUN 17; Creatinine, Ser 1.36; Potassium 4.1; Sodium 143  Recent Lipid Panel    Component Value Date/Time   CHOL 248 (H) 07/02/2019 1135   TRIG 88 07/02/2019 1135   HDL 78 07/02/2019 1135   CHOLHDL 3.2 07/02/2019 1135   CHOLHDL 3.4 Ratio 03/30/2009 2039   VLDL 28 03/30/2009 2039   LDLCALC 155 (H) 07/02/2019 1135      Exam:    Vital Signs:  BP (!) 185/109   LMP  (LMP Unknown) Comment: vaginal bleeding continuously for last 8 months    Wt Readings from Last 3 Encounters:  07/09/19 257 lb (116.6 kg)  07/02/19 254 lb (115.2 kg)  02/04/19 248 lb (112.5 kg)     Well nourished, well developed in no acute distress. Alert awake and x3 quite cheerful and full.  Denies having any issues.  Went to grocery store today she is  very proud of herself s because of this.  Diagnosis for this visit:   1. Chronic diastolic congestive heart failure, NYHA class 3 ([place])   2. Essential hypertension   3. Endometrial cancer ([place])      ASSESSMENT & PLAN:    1.  Chronic diastolic congestive heart failure appears to be reasonably controlled however I warned about her blood pressure being still high.  I talked to her about her blood pressure monitor she does not check this on a regular basis she admits also that she does have do a lot of dietary mistakes including this morning she ate a bacon and eggs.  She understands a problem trying to work on that.  Likely there are episode of swelling of lower extremities.  Well will bring  her to the office to have echocardiogram done at that time we will check her Chem-7 and then decide about additional therapy. 2.  Essential hypertension uncontrolled, still a problem and quickly to be addressed.  She will be here in the office to have echocardiogram to check Chem-7 and then will decide about this therapy. 3.  History of endometrial cancer followed by oncology team.  COVID-19 Education: The signs and symptoms of COVID-19 were discussed with the patient and how to seek care for testing (follow up with PCP or arrange E-visit).  The importance of social distancing was discussed today.  Patient Risk:   After full review of this patients clinical status, I feel that they are at least moderate risk at this time.  Time:   Today, I have spent 11 minutes with the patient with telehealth technology discussing pt health issues.  I spent 18 minutes reviewing her chart before the visit.  Visit was finished at 4:36 PM.    Medication Adjustments/Labs and Tests Ordered: Current medicines are reviewed at length with the patient today.  Concerns regarding medicines are outlined above.  No orders of the defined types were placed in this encounter.  Medication changes: No orders of the defined types were  placed in this encounter.    Disposition: Follow-up 3 months  Signed, Park Liter, MD, [facility] 12/06/2019 4:33 PM    [place]

## 2019-12-19 ENCOUNTER — Other Ambulatory Visit: Payer: Self-pay

## 2019-12-19 ENCOUNTER — Observation Stay (HOSPITAL_COMMUNITY)
Admission: EM | Admit: 2019-12-19 | Discharge: 2019-12-21 | Disposition: A | Discharge status: Home / Self Care | Payer: Medicaid Other | Attending: Internal Medicine | Admitting: Internal Medicine

## 2019-12-19 ENCOUNTER — Emergency Department (HOSPITAL_COMMUNITY): Payer: Medicaid Other

## 2019-12-19 ENCOUNTER — Encounter (HOSPITAL_COMMUNITY): Payer: Self-pay | Admitting: Emergency Medicine

## 2019-12-19 DIAGNOSIS — R0789 Other chest pain: Secondary | ICD-10-CM

## 2019-12-19 DIAGNOSIS — R079 Chest pain, unspecified: Secondary | ICD-10-CM | POA: Diagnosis present

## 2019-12-19 DIAGNOSIS — I130 Hypertensive heart and chronic kidney disease with heart failure and stage 1 through stage 4 chronic kidney disease, or unspecified chronic kidney disease: Secondary | ICD-10-CM | POA: Insufficient documentation

## 2019-12-19 DIAGNOSIS — F1990 Other psychoactive substance use, unspecified, uncomplicated: Secondary | ICD-10-CM | POA: Diagnosis present

## 2019-12-19 DIAGNOSIS — R42 Dizziness and giddiness: Secondary | ICD-10-CM | POA: Diagnosis not present

## 2019-12-19 DIAGNOSIS — Z20822 Contact with and (suspected) exposure to covid-19: Secondary | ICD-10-CM | POA: Diagnosis not present

## 2019-12-19 DIAGNOSIS — N183 Chronic kidney disease, stage 3 unspecified: Secondary | ICD-10-CM | POA: Diagnosis not present

## 2019-12-19 DIAGNOSIS — Z79899 Other long term (current) drug therapy: Secondary | ICD-10-CM | POA: Insufficient documentation

## 2019-12-19 DIAGNOSIS — Z87891 Personal history of nicotine dependence: Secondary | ICD-10-CM | POA: Insufficient documentation

## 2019-12-19 DIAGNOSIS — I10 Essential (primary) hypertension: Secondary | ICD-10-CM | POA: Diagnosis present

## 2019-12-19 DIAGNOSIS — I5030 Unspecified diastolic (congestive) heart failure: Secondary | ICD-10-CM | POA: Diagnosis not present

## 2019-12-19 DIAGNOSIS — M25562 Pain in left knee: Secondary | ICD-10-CM | POA: Diagnosis not present

## 2019-12-19 DIAGNOSIS — F1490 Cocaine use, unspecified, uncomplicated: Principal | ICD-10-CM | POA: Insufficient documentation

## 2019-12-19 DIAGNOSIS — M545 Low back pain: Secondary | ICD-10-CM | POA: Insufficient documentation

## 2019-12-19 DIAGNOSIS — R0602 Shortness of breath: Secondary | ICD-10-CM | POA: Insufficient documentation

## 2019-12-19 DIAGNOSIS — Z85118 Personal history of other malignant neoplasm of bronchus and lung: Secondary | ICD-10-CM | POA: Diagnosis not present

## 2019-12-19 DIAGNOSIS — W19XXXA Unspecified fall, initial encounter: Secondary | ICD-10-CM

## 2019-12-19 DIAGNOSIS — M25569 Pain in unspecified knee: Secondary | ICD-10-CM

## 2019-12-19 HISTORY — DX: Disorder of kidney and ureter, unspecified: N28.9

## 2019-12-19 LAB — CBC
HCT: 40.7 % (ref 36.0–46.0)
Hemoglobin: 12.9 g/dL (ref 12.0–15.0)
MCH: 29.4 pg (ref 26.0–34.0)
MCHC: 31.7 g/dL (ref 30.0–36.0)
MCV: 92.7 fL (ref 80.0–100.0)
Platelets: 281 10*3/uL (ref 150–400)
RBC: 4.39 MIL/uL (ref 3.87–5.11)
RDW: 16.1 % — ABNORMAL HIGH (ref 11.5–15.5)
WBC: 6.9 10*3/uL (ref 4.0–10.5)
nRBC: 0 % (ref 0.0–0.2)

## 2019-12-19 LAB — I-STAT BETA HCG BLOOD, ED (MC, WL, AP ONLY): I-stat hCG, quantitative: 5.1 m[IU]/mL — ABNORMAL HIGH (ref <5)

## 2019-12-19 LAB — BASIC METABOLIC PANEL
Anion gap: 10 (ref 5–15)
BUN: 21 mg/dL — ABNORMAL HIGH (ref 6–20)
CO2: 24 mmol/L (ref 22–32)
Calcium: 9.3 mg/dL (ref 8.9–10.3)
Chloride: 107 mmol/L (ref 98–111)
Creatinine, Ser: 1.69 mg/dL — ABNORMAL HIGH (ref 0.44–1.00)
GFR calc Af Amer: 38 mL/min — ABNORMAL LOW (ref >60)
GFR calc non Af Amer: 33 mL/min — ABNORMAL LOW (ref >60)
Glucose, Bld: 120 mg/dL — ABNORMAL HIGH (ref 70–99)
Potassium: 4 mmol/L (ref 3.5–5.1)
Sodium: 141 mmol/L (ref 135–145)

## 2019-12-19 LAB — TROPONIN I (HIGH SENSITIVITY): Troponin I (High Sensitivity): 28 ng/L — ABNORMAL HIGH (ref <18)

## 2019-12-19 MED ORDER — SODIUM CHLORIDE 0.9% FLUSH
3.0000 mL | Freq: Once | INTRAVENOUS | Status: AC
  Administered 2019-12-20: 3 mL via INTRAVENOUS

## 2019-12-19 NOTE — ED Triage Notes (Signed)
Pt c/o L upper chest pain worse in the last 2 days, pt also c/o lower back pain.

## 2019-12-20 ENCOUNTER — Encounter (HOSPITAL_COMMUNITY): Payer: Self-pay | Admitting: Internal Medicine

## 2019-12-20 ENCOUNTER — Observation Stay (HOSPITAL_COMMUNITY): Payer: Medicaid Other

## 2019-12-20 ENCOUNTER — Other Ambulatory Visit: Payer: Self-pay

## 2019-12-20 ENCOUNTER — Observation Stay (HOSPITAL_BASED_OUTPATIENT_CLINIC_OR_DEPARTMENT_OTHER): Payer: Medicaid Other

## 2019-12-20 DIAGNOSIS — R079 Chest pain, unspecified: Secondary | ICD-10-CM

## 2019-12-20 DIAGNOSIS — M25562 Pain in left knee: Secondary | ICD-10-CM | POA: Diagnosis present

## 2019-12-20 DIAGNOSIS — N1832 Chronic kidney disease, stage 3b: Secondary | ICD-10-CM

## 2019-12-20 DIAGNOSIS — I129 Hypertensive chronic kidney disease with stage 1 through stage 4 chronic kidney disease, or unspecified chronic kidney disease: Secondary | ICD-10-CM | POA: Diagnosis not present

## 2019-12-20 HISTORY — DX: Chest pain, unspecified: R07.9

## 2019-12-20 LAB — RAPID URINE DRUG SCREEN, HOSP PERFORMED
Amphetamines: NOT DETECTED
Barbiturates: NOT DETECTED
Benzodiazepines: NOT DETECTED
Cocaine: POSITIVE — AB
Opiates: NOT DETECTED
Tetrahydrocannabinol: NOT DETECTED

## 2019-12-20 LAB — BRAIN NATRIURETIC PEPTIDE: B Natriuretic Peptide: 109 pg/mL — ABNORMAL HIGH (ref 0.0–100.0)

## 2019-12-20 LAB — HIV ANTIBODY (ROUTINE TESTING W REFLEX): HIV Screen 4th Generation wRfx: NONREACTIVE

## 2019-12-20 LAB — TROPONIN I (HIGH SENSITIVITY): Troponin I (High Sensitivity): 21 ng/L — ABNORMAL HIGH (ref <18)

## 2019-12-20 LAB — TSH: TSH: 4.035 u[IU]/mL (ref 0.350–4.500)

## 2019-12-20 LAB — SARS CORONAVIRUS 2 BY RT PCR (HOSPITAL ORDER, PERFORMED IN ~~LOC~~ HOSPITAL LAB): SARS Coronavirus 2: NEGATIVE

## 2019-12-20 LAB — MAGNESIUM: Magnesium: 2 mg/dL (ref 1.7–2.4)

## 2019-12-20 LAB — ECHOCARDIOGRAM COMPLETE
Height: 64 in
Weight: 4112 oz

## 2019-12-20 MED ORDER — ASPIRIN EC 81 MG PO TBEC: 81.0000 mg | DELAYED_RELEASE_TABLET | Freq: Every day | ORAL | Status: DC

## 2019-12-20 MED ORDER — PERFLUTREN LIPID MICROSPHERE
1.0000 mL | INTRAVENOUS | Status: AC | PRN
  Administered 2019-12-20: 3 mL via INTRAVENOUS
  Filled 2019-12-20: qty 10

## 2019-12-20 MED ORDER — IPRATROPIUM-ALBUTEROL 0.5-2.5 (3) MG/3ML IN SOLN
3.0000 mL | Freq: Once | RESPIRATORY_TRACT | Status: AC
  Administered 2019-12-20: 3 mL via RESPIRATORY_TRACT
  Filled 2019-12-20: qty 3

## 2019-12-20 MED ORDER — ENOXAPARIN SODIUM 40 MG/0.4ML ~~LOC~~ SOLN
40.0000 mg | SUBCUTANEOUS | Status: DC
  Administered 2019-12-20: 40 mg via SUBCUTANEOUS
  Filled 2019-12-20: qty 0.4

## 2019-12-20 MED ORDER — ALBUTEROL SULFATE HFA 108 (90 BASE) MCG/ACT IN AERS
1.0000 | INHALATION_SPRAY | Freq: Four times a day (QID) | RESPIRATORY_TRACT | Status: DC | PRN
  Filled 2019-12-20: qty 6.7

## 2019-12-20 MED ORDER — ASPIRIN 81 MG PO CHEW
324.0000 mg | CHEWABLE_TABLET | Freq: Once | ORAL | Status: AC
  Administered 2019-12-20: 324 mg via ORAL
  Filled 2019-12-20: qty 4

## 2019-12-20 MED ORDER — ACETAMINOPHEN 650 MG RE SUPP: 650.0000 mg | Freq: Four times a day (QID) | RECTAL | Status: DC | PRN

## 2019-12-20 MED ORDER — FUROSEMIDE 10 MG/ML IJ SOLN
20.0000 mg | Freq: Once | INTRAMUSCULAR | Status: AC
  Administered 2019-12-20: 20 mg via INTRAVENOUS
  Filled 2019-12-20: qty 2

## 2019-12-20 MED ORDER — AMLODIPINE BESYLATE 10 MG PO TABS
10.0000 mg | ORAL_TABLET | Freq: Every day | ORAL | Status: DC
  Administered 2019-12-20 – 2019-12-21 (×2): 10 mg via ORAL
  Filled 2019-12-20: qty 2
  Filled 2019-12-20: qty 1

## 2019-12-20 MED ORDER — PANTOPRAZOLE SODIUM 40 MG PO TBEC
40.0000 mg | DELAYED_RELEASE_TABLET | Freq: Every day | ORAL | Status: DC
  Administered 2019-12-20 – 2019-12-21 (×2): 40 mg via ORAL
  Filled 2019-12-20: qty 1
  Filled 2019-12-20: qty 2
  Filled 2019-12-20: qty 1

## 2019-12-20 MED ORDER — NITROGLYCERIN 0.4 MG SL SUBL
0.4000 mg | SUBLINGUAL_TABLET | SUBLINGUAL | Status: AC | PRN
  Administered 2019-12-20 – 2019-12-21 (×4): 0.4 mg via SUBLINGUAL
  Filled 2019-12-20 (×2): qty 1

## 2019-12-20 MED ORDER — ATORVASTATIN CALCIUM 10 MG PO TABS
20.0000 mg | ORAL_TABLET | Freq: Every day | ORAL | Status: DC
  Administered 2019-12-20: 20 mg via ORAL
  Filled 2019-12-20: qty 2

## 2019-12-20 MED ORDER — ACETAMINOPHEN 325 MG PO TABS
650.0000 mg | ORAL_TABLET | Freq: Four times a day (QID) | ORAL | Status: DC | PRN
  Administered 2019-12-21: 650 mg via ORAL
  Filled 2019-12-20: qty 2

## 2019-12-20 NOTE — Progress Notes (Signed)
  Echocardiogram 2D Echocardiogram has been performed.  Angela Gardner 12/20/2019, 11:35 AM

## 2019-12-20 NOTE — ED Notes (Signed)
Pt in xray

## 2019-12-20 NOTE — ED Notes (Signed)
Report given to floor nurse

## 2019-12-20 NOTE — ED Notes (Signed)
Pt transferred to hospital bed for comfort.

## 2019-12-20 NOTE — ED Notes (Signed)
Echo Tech in room

## 2019-12-20 NOTE — ED Provider Notes (Signed)
[facility] EMERGENCY DEPARTMENT Provider Note   CSN: [id number] Arrival date & time: 12/19/19  2002     History Chief Complaint  Patient presents with  . Chest Pain    Angela Gardner is a 59 y.o. female.  HPI 59 year old female presents with chest pain.  It is a sharp pain that is constant over the last couple days.  Chronic cough but no new cough.  She does feel little more short of breath than typical.  Symptoms are worse with laying flat and better with sitting up.  She has congestive heart failure but feels like her legs are not particularly swollen today.  No fevers.  Has tried Benadryl because the pain keeps her up at night. Pain is not pleuritic. Triage notes she also has low back pain; she tells me this is a chronic pain.   Past Medical History:  Diagnosis Date  . Anemia   . 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 CHF (congestive heart failure) ([place])   . Dyspnea    still having this and not moving around alot -gets worse with exertion  . History of sleep walking   . Hx of left breast biopsy   . Hyperlipemia   . Hypertension    not on any medication now-[facility] had  prescribed her a med and she stopped taking them cannot remember when  . Renal disorder     Patient Active Problem List   Diagnosis Date Noted  . Preventative health care 11/07/2018  . 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 Depression 04/02/2018  . Prediabetes 04/02/2018  . Vitamin D deficiency 02/15/2018  . Diffuse pain 01/04/2018  . 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 Other constipation 11/28/2017  . Illicit drug use 77/82/4235  . Chronic kidney disease (CKD), stage III (moderate) 10/30/2017  . Essential hypertension 10/27/2017  . Diastolic congestive heart failure, NYHA class 3 ([place]) 10/27/2017  . Endometrial cancer ([place]) 10/19/2017   . Morbid obesity ([place]) 10/09/2017  . Acute exacerbation of CHF (congestive heart failure) ([place]) 09/13/2017  . Hypertensive crisis 09/12/2017  . Mild renal insufficiency 09/12/2017  . Dysfunctional uterine bleeding 09/12/2017  . Acute CHF (congestive heart failure) ([place]) 09/12/2017  . Chest discomfort 09/12/2017  . PMB (postmenopausal bleeding) 06/17/2015  . 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 OTITIS MEDIA 12/04/2008  . ACUTE BRONCHITIS 12/04/2008    Past Surgical History:  Procedure Laterality Date  . BREAST BIOPSY     left breast  . CESAREAN SECTION     one  . CHOLECYSTECTOMY    . DILATION AND CURETTAGE OF UTERUS     approx 1 year ago  . IR IMAGING GUIDED PORT INSERTION  12/06/2017  . IR REMOVAL TUN ACCESS W/ PORT W/O FL MOD SED  06/19/2018  . LYMPH NODE BIOPSY N/A 11/14/2017   Procedure: SENTINEL LYMPH NODE BIOPSY;  Surgeon: Everitt Amber, MD;  Location: [facility];  Service: Gynecology;  Laterality: N/A;  . ROBOTIC ASSISTED TOTAL HYSTERECTOMY WITH BILATERAL SALPINGO OOPHERECTOMY N/A 11/14/2017   Procedure: XI ROBOTIC ASSISTED TOTAL HYSTERECTOMY WITH BILATERAL SALPINGO OOPHORECTOMY, RIGHT PELVIC LYMPHADENECTOMY;  Surgeon: Everitt Amber, MD;  Location: [facility];  Service: Gynecology;  Laterality: N/A;     OB History    Gravida  3   Para  1   Term  1   Preterm  AB  2   Living  1     SAB      TAB  2   Ectopic      Multiple      Live Births              Family History  Problem Relation Age of Onset  . Anuerysm Mother   . Congestive Heart Failure Mother   . Hypertension Father   . Hypertension Sister   . Hypertension Brother   . Colon cancer Neg Hx     Social History   Tobacco Use  . Smoking status: Former Smoker    Packs/day: 0.30    Years: 26.00    Pack years: 7.80    Types: Cigarettes  . Smokeless tobacco: Never Used  . Tobacco comment: quit May 2019  Vaping Use  . Vaping Use: Never used  Substance Use Topics  . Alcohol use:  Not Currently    Comment: social drinking  . Drug use: Yes    Types: Cocaine, "Crack" cocaine    Home Medications Prior to Admission medications   Medication Sig Start Date End Date Taking? Authorizing Provider  acetaminophen (TYLENOL) 500 MG tablet Take 1,000 mg by mouth every 6 (six) hours as needed for moderate pain.    Yes [provider]  amLODipine (NORVASC) 10 MG tablet Take 1 tablet (10 mg total) by mouth daily. 07/02/19 12/20/19 Yes Charlott Rakes, MD  atorvastatin (LIPITOR) 40 MG tablet Take 0.5 tablets (20 mg total) by mouth daily. 07/03/19  Yes Charlott Rakes, MD  furosemide (LASIX) 40 MG tablet Take 1 tablet (40 mg total) by mouth 2 (two) times daily. 07/02/19  Yes Charlott Rakes, MD  hydrALAZINE (APRESOLINE) 50 MG tablet Take 1 tablet (50 mg total) by mouth 3 (three) times daily. 07/02/19  Yes Charlott Rakes, MD  metoprolol tartrate (LOPRESSOR) 50 MG tablet Take 1 tablet (50 mg total) by mouth 2 (two) times daily. 07/02/19  Yes Charlott Rakes, MD  prochlorperazine (COMPAZINE) 10 MG tablet Take 1 tablet (10 mg total) by mouth every 6 (six) hours as needed (Nausea or vomiting). Patient not taking: Reported on 05/25/2018 12/08/17 05/29/18  Heath Lark, MD    Allergies    Vicodin [hydrocodone-acetaminophen]  Review of Systems   Review of Systems  Constitutional: Positive for diaphoresis. Negative for fever.  Respiratory: Positive for cough and shortness of breath.   Cardiovascular: Positive for chest pain. Negative for leg swelling.  Gastrointestinal: Negative for vomiting.  All other systems reviewed and are negative.   Physical Exam Updated Vital Signs BP (!) 173/79   Pulse (!) 49   Temp 98 F (36.7 C) (Oral)   Resp 17   Ht 5\' 4"  (1.626 m)   Wt 116.6 kg   LMP  (LMP Unknown) Comment: vaginal bleeding continuously for last 8 months  SpO2 97%   BMI 44.11 kg/m   Physical Exam Vitals and nursing note reviewed.  Constitutional:      General: She is not in  acute distress.    Appearance: She is well-developed. She is obese. She is not ill-appearing or diaphoretic.  HENT:     Head: Normocephalic and atraumatic.     Right Ear: External ear normal.     Left Ear: External ear normal.     Nose: Nose normal.  Eyes:     General:        Right eye: No discharge.        Left eye: No discharge.  Cardiovascular:     Rate and Rhythm: Normal rate and regular rhythm.     Heart sounds: Normal heart sounds.  Pulmonary:     Effort: Pulmonary effort is normal. No tachypnea, accessory muscle usage or respiratory distress.     Breath sounds: Examination of the right-lower field reveals rhonchi and rales. Examination of the left-lower field reveals rhonchi and rales. Rhonchi and rales present.  Chest:     Chest wall: No tenderness.  Abdominal:     Palpations: Abdomen is soft.     Tenderness: There is no abdominal tenderness.  Musculoskeletal:     Right lower leg: No edema.     Left lower leg: No edema.  Skin:    General: Skin is warm and dry.  Neurological:     Mental Status: She is alert.  Psychiatric:        Mood and Affect: Mood is not anxious.     ED Results / Procedures / Treatments   Labs (all labs ordered are listed, but only abnormal results are displayed) Labs Reviewed  BASIC METABOLIC PANEL - Abnormal; Notable for the following components:      Result Value   Glucose, Bld 120 (*)    BUN 21 (*)    Creatinine, Ser 1.69 (*)    GFR calc non Af Amer 33 (*)    GFR calc Af Amer 38 (*)    All other components within normal limits  CBC - Abnormal; Notable for the following components:   RDW 16.1 (*)    All other components within normal limits  I-STAT BETA HCG BLOOD, ED ([facility], [facility], [facility] ONLY) - Abnormal; Notable for the following components:   I-stat hCG, quantitative 5.1 (*)    All other components within normal limits  TROPONIN I (HIGH SENSITIVITY) - Abnormal; Notable for the following components:   Troponin I (High Sensitivity) 28 (*)     All other components within normal limits  TROPONIN I (HIGH SENSITIVITY) - Abnormal; Notable for the following components:   Troponin I (High Sensitivity) 21 (*)    All other components within normal limits  SARS CORONAVIRUS 2 BY RT PCR (HOSPITAL ORDER, [place] [facility])  BRAIN NATRIURETIC PEPTIDE    EKG EKG Interpretation  Date/Time:  Thursday December 19 2019 20:11:56 EDT Ventricular Rate:  53 PR Interval:  138 QRS Duration: 92 QT Interval:  528 QTC Calculation: 495 R Axis:   -6 Text Interpretation: Sinus bradycardia Minimal voltage criteria for LVH, may be normal variant ( Cornell product ) Abnormal QRS-T angle, consider primary T wave abnormality Prolonged QT Abnormal ECG Confirmed by Pryor Curia [phone]) on 12/19/2019 8:14:12 PM   Radiology DG Chest 2 View  Result Date: 12/19/2019 CLINICAL DATA:  Chest pain EXAM: CHEST - 2 VIEW COMPARISON:  11/16/2017 FINDINGS: Mild cardiomegaly. No focal opacity or pleural effusion. No pneumothorax. IMPRESSION: No active cardiopulmonary disease. Mild cardiomegaly. Electronically Signed   By: Donavan Foil M.D.   On: 12/19/2019 20:57    Procedures Procedures (including critical care time)  Medications Ordered in ED Medications  nitroGLYCERIN (NITROSTAT) SL tablet 0.4 mg (0.4 mg Sublingual Given 12/20/19 0811)  sodium chloride flush (NS) 0.9 % injection 3 mL (3 mLs Intravenous Given 12/20/19 0802)  aspirin chewable tablet 324 mg (324 mg Oral Given 12/20/19 0802)    ED Course  I have reviewed the triage vital signs and the nursing notes.  Pertinent labs & imaging results that were available during my care of  the patient were reviewed by me and considered in my medical decision making (see chart for details).    MDM Rules/Calculators/A&P                          Patient's chest pain is atypical but she does have low level abnormal troponins in the 20s.  Never had this before.  She could have some mild decompensated CHF with  some rales at her bases.  Otherwise, per protocol she should be admitted for ACS work-up with these abnormal troponins.  My suspicion of PE is pretty low.  Internal medicine teaching service to admit. Final Clinical Impression(s) / ED Diagnoses Final diagnoses:  Atypical chest pain    Rx / DC Orders ED Discharge Orders    None       Sherwood Gambler, MD 12/20/19 [phone]

## 2019-12-20 NOTE — H&P (Addendum)
Date: 12/20/2019               Patient Name:  Angela Gardner MRN: [id number]  DOB: 1960/07/15 Age / Sex: 59 y.o., female   PCP: Charlott Rakes, MD         Medical Service: Internal Medicine Teaching Service         Attending Physician: Dr. Rebeca Alert Raynaldo Opitz, MD    First Contact: Dr. Bridgett Larsson Pager: [phone]  Second Contact: Dr. Eileen Stanford Pager: [phone]       After Hours (After 5p/  First Contact Pager: [phone]  weekends / holidays): Second Contact Pager: [phone]   Chief Complaint: Chest Pain  History of Present Illness:   Angela Gardner is a 59yo female with hypertension, HFpEF, PMH of uterine Ca s/p chemo 04/2018, and CKD Stage III presenting with two days of sharp chest pain. She states the pain has been constant but has improved currently and she is very tired as she's been here all night. She states this has never happened before. She denies nausea, jaw pain, arm pain, or back pain. She has chronic shortness of breath that gets worse with walking but is currently not worse than usual. She states she can sometimes hear herself wheezing. This has been present for the past two years. She also has chronic lower extremity swelling that is actually better than usual today.  She takes metoprolol, lasix, atorvastatin, and hydralazine and states she frequently misses doses. She last took her medication yesterday morning, though.  She has a very poor home living situation, living with her sister in a trailer that has bed bugs and lots of holes. She frequently falls due to this and dizziness that she thinks is caused by one of her medications but is not sure which one. She has not lost consciousness and did not hit her chest. She is not currently feeling dizzy. She did bruise her left knee last time she fell. Due to her home situation and chronic left knee pain she sometimes uses crack-cocaine. She last used this three days ago and it does help her knee pain.   She also endorses diffuse abdominal  pain that is worse in the epigastric area. She is not sure when the diffuse pain started but states it was recent. She thinks this is due to drinking lots of soda as the pain is worse then and feels like heartburn. She denies changes in urination, constipation or diarrhea.   Social:   She lives in a trailer with her sister but wants to move out but her sister needs her support monetarily.  She is disabled due to previous uterine cancer and is currently not working She smokes about 10 cigarettes per day for the last 40 years.  She occasionally uses crack cocaine  Family History:   Family History  Problem Relation Age of Onset  . Anuerysm Mother   . Congestive Heart Failure Mother   . Hypertension Father   . Hypertension Sister   . Hypertension Brother   . Colon cancer Neg Hx      Meds:  Current Meds  Medication Sig  . acetaminophen (TYLENOL) 500 MG tablet Take 1,000 mg by mouth every 6 (six) hours as needed for moderate pain.   Marland Kitchen amLODipine (NORVASC) 10 MG tablet Take 1 tablet (10 mg total) by mouth daily.  Marland Kitchen atorvastatin (LIPITOR) 40 MG tablet Take 0.5 tablets (20 mg total) by mouth daily.  . furosemide (LASIX) 40 MG tablet Take 1 tablet (  40 mg total) by mouth 2 (two) times daily.  . hydrALAZINE (APRESOLINE) 50 MG tablet Take 1 tablet (50 mg total) by mouth 3 (three) times daily.  . metoprolol tartrate (LOPRESSOR) 50 MG tablet Take 1 tablet (50 mg total) by mouth 2 (two) times daily.     Allergies: Allergies as of 12/19/2019 - Review Complete 12/19/2019  Allergen Reaction Noted  . Vicodin [hydrocodone-acetaminophen] Itching 11/23/2011   Past Medical History:  Diagnosis Date  . Anemia   . 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 CHF (congestive heart failure) ([place])   . Dyspnea    still having this and not moving around alot -gets worse with exertion  . History of sleep walking   . Hx of left breast biopsy   . Hyperlipemia   . Hypertension    not on any medication  now[facility] had  prescribed her a med and she stopped taking them cannot remember when  . Renal disorder      Review of Systems: A complete ROS was negative except as per HPI.   Physical Exam: Blood pressure (!) 146/85, pulse (!) 51, temperature 98 F (36.7 C), temperature source Oral, resp. rate 18, height 5\' 4"  (1.626 m), weight 116.6 kg, SpO2 94 %.  Constitution: falling asleep at first but roused to voice, tearful Cardio: bradycardic, no m/r/g, no JVD, trace edema Respiratory: diffuse wheezing, lower lobe inspiratory crackles Abdominal: diffusely mild TTP, non-distended, normal bowel sounds MSK: moving all extremities, no swelling left knee, small hematoma, full ROM, NTTP Neuro: alert after being woken, oriented, sad Skin: c/d/i    EKG: personally reviewed my interpretation is bradycardia, atrial enlargement, similar to prior  CXR: personally reviewed my interpretation is mild cardiomegaly, no acute abnormalities  ECHO 08/09/2018 1. The left ventricle has normal systolic function with an ejection  fraction of 60-65%. The cavity size was normal. There is severely  increased left ventricular wall thickness. Left ventricular diastolic  Doppler parameters are consistent with impaired  relaxation.  2. The right ventricle has normal systolic function. The cavity was  normal. There is no increase in right ventricular wall thickness.  3. The mitral valve is normal in structure.  4. The tricuspid valve is normal in structure.  5. The aortic valve is tricuspid.  6. The pulmonic valve was normal in structure.   Assessment & Plan by Problem: Active Problems:   Chest pain  Angela Gardner is a 59yo female with hypertension, HFpEF, PMH of uterine Ca s/p chemo 04/2018, and CKD Stage III presenting with two days of sharp chest pain.   Chest Pain Hypertension HLD Trops mildly elevated but flat ~28>21. Received asa 325 in the ER. BNP very slightly elevated at 108. Blood  pressure uncontrolled. Last echo 07/2018 showed normal EF with severely increased left ventricular wall thickness. EKG unchanged from prior and without ischemic changes. She does not appear very volume up on exam or CXR.  Symptoms most likely in the setting of recent crack cocaine use in addition to medication non-compliance due to side effects.   - admit overnight for observation  - ECHO  - a1c - restart norvasc. Will hold hydralazine and metop for now.  - lasix 20 mg IV once - continue high intensity statin - nitro prn - ekg prn and in the am   Bradycardia Dizziness and Falls Brady since arrival to the ER around 2000 last night, did not rise much after waking her up (high 40s - low 50s). Received  nitro this morning, I do not think she got this yesterday. Currently asymptomatic. Last took her beta blocker yesterday morning. I suspect this dose may be too high as she is currently brady and it has been >24 hours. No AV block on EKG, no other drug use.   - hold metop for now, likely restart at lower dose  - f/u echo - obtain orthostatics  - TSH - UDS  Wheezing With history of smoking and diffuse bilateral wheezing she likely has some componenent of COPD. No symptoms of exacerbation, on RA. No PFTs in chart.   - duonebs now then ipratropium nebulizer prn q6h - albuterol inhaler prn   GERD Symptoms consistent with GERD. Start protonix. Discussed importance of stopping sodas.   Diffuse Abdominal Pain  She has a history of uterine cancer s/p hysterectomy and oophorectomy and chemotherapy completed November 2019 without recurrence. Symptoms are mild, no significant weight loss. Likely in the setting of acute illness and exhaustion. She follows with Dr. Everitt Amber with gyn onc and has an appointment 7/19. She has kept prior appointments.   - f/u with Dr. Denman George on 7/18.   Crack Cocaine Use Consult to [facility] for resources on drug use.   Knee Pain Likely osteoarthritis, no current swelling,  signs of infection. Will obtain xr with recent fall and ongoing cause of distress and pain.    Diet: heart healthy VTE: lovenox IVF: none Code: full   Dispo: Admit patient to Observation with expected length of stay less than 2 midnights.  SignedMarty Heck, DO 12/20/2019, 9:44 AM  Pager: [phone]

## 2019-12-21 ENCOUNTER — Other Ambulatory Visit: Payer: Self-pay | Admitting: Internal Medicine

## 2019-12-21 DIAGNOSIS — I130 Hypertensive heart and chronic kidney disease with heart failure and stage 1 through stage 4 chronic kidney disease, or unspecified chronic kidney disease: Secondary | ICD-10-CM | POA: Diagnosis not present

## 2019-12-21 DIAGNOSIS — I5030 Unspecified diastolic (congestive) heart failure: Secondary | ICD-10-CM | POA: Diagnosis not present

## 2019-12-21 DIAGNOSIS — F1490 Cocaine use, unspecified, uncomplicated: Secondary | ICD-10-CM | POA: Diagnosis not present

## 2019-12-21 DIAGNOSIS — R42 Dizziness and giddiness: Secondary | ICD-10-CM | POA: Diagnosis not present

## 2019-12-21 LAB — CBC
HCT: 41 % (ref 36.0–46.0)
Hemoglobin: 12.7 g/dL (ref 12.0–15.0)
MCH: 28.7 pg (ref 26.0–34.0)
MCHC: 31 g/dL (ref 30.0–36.0)
MCV: 92.6 fL (ref 80.0–100.0)
Platelets: 258 10*3/uL (ref 150–400)
RBC: 4.43 MIL/uL (ref 3.87–5.11)
RDW: 15.9 % — ABNORMAL HIGH (ref 11.5–15.5)
WBC: 4.8 10*3/uL (ref 4.0–10.5)
nRBC: 0 % (ref 0.0–0.2)

## 2019-12-21 LAB — BASIC METABOLIC PANEL
Anion gap: 11 (ref 5–15)
Anion gap: 11 (ref 5–15)
BUN: 22 mg/dL — ABNORMAL HIGH (ref 6–20)
BUN: 22 mg/dL — ABNORMAL HIGH (ref 6–20)
CO2: 23 mmol/L (ref 22–32)
CO2: 22 mmol/L (ref 22–32)
Calcium: 9 mg/dL (ref 8.9–10.3)
Calcium: 9.1 mg/dL (ref 8.9–10.3)
Chloride: 105 mmol/L (ref 98–111)
Chloride: 106 mmol/L (ref 98–111)
Creatinine, Ser: 1.54 mg/dL — ABNORMAL HIGH (ref 0.44–1.00)
Creatinine, Ser: 1.59 mg/dL — ABNORMAL HIGH (ref 0.44–1.00)
GFR calc Af Amer: 43 mL/min — ABNORMAL LOW (ref >60)
GFR calc Af Amer: 41 mL/min — ABNORMAL LOW (ref >60)
GFR calc non Af Amer: 37 mL/min — ABNORMAL LOW (ref >60)
GFR calc non Af Amer: 36 mL/min — ABNORMAL LOW (ref >60)
Glucose, Bld: 106 mg/dL — ABNORMAL HIGH (ref 70–99)
Glucose, Bld: 136 mg/dL — ABNORMAL HIGH (ref 70–99)
Potassium: 5.5 mmol/L — ABNORMAL HIGH (ref 3.5–5.1)
Potassium: 3.6 mmol/L (ref 3.5–5.1)
Sodium: 139 mmol/L (ref 135–145)
Sodium: 139 mmol/L (ref 135–145)

## 2019-12-21 LAB — TROPONIN I (HIGH SENSITIVITY): Troponin I (High Sensitivity): 12 ng/L (ref <18)

## 2019-12-21 MED ORDER — ALUM & MAG HYDROXIDE-SIMETH 200-200-20 MG/5ML PO SUSP
30.0000 mL | Freq: Once | ORAL | Status: AC
  Administered 2019-12-21: 30 mL via ORAL
  Filled 2019-12-21: qty 30

## 2019-12-21 MED ORDER — HYDRALAZINE HCL 50 MG PO TABS: 75.0000 mg | ORAL_TABLET | Freq: Three times a day (TID) | ORAL | 1 refills | Status: DC

## 2019-12-21 MED ORDER — LACTATED RINGERS IV BOLUS: 1000.0000 mL | Freq: Once | INTRAVENOUS | Status: DC

## 2019-12-21 MED ORDER — HYDRALAZINE HCL 50 MG PO TABS
75.0000 mg | ORAL_TABLET | Freq: Three times a day (TID) | ORAL | Status: DC
  Administered 2019-12-21: 75 mg via ORAL
  Filled 2019-12-21: qty 1

## 2019-12-21 MED ORDER — LIDOCAINE VISCOUS HCL 2 % MT SOLN
15.0000 mL | Freq: Once | OROMUCOSAL | Status: AC
  Administered 2019-12-21: 15 mL via ORAL
  Filled 2019-12-21: qty 15

## 2019-12-21 MED ORDER — PANTOPRAZOLE SODIUM 40 MG PO TBEC: 40.0000 mg | DELAYED_RELEASE_TABLET | Freq: Every day | ORAL | 0 refills | Status: DC

## 2019-12-21 MED ORDER — NITROGLYCERIN 0.4 MG SL SUBL
SUBLINGUAL_TABLET | SUBLINGUAL | Status: AC
  Administered 2019-12-21: 0.4 mg
  Filled 2019-12-21: qty 1

## 2019-12-21 MED ORDER — HYDRALAZINE HCL 50 MG PO TABS
50.0000 mg | ORAL_TABLET | Freq: Three times a day (TID) | ORAL | Status: DC
  Administered 2019-12-21: 50 mg via ORAL
  Filled 2019-12-21: qty 1

## 2019-12-21 MED ORDER — DICLOFENAC SODIUM 1 % EX GEL
2.0000 g | Freq: Four times a day (QID) | CUTANEOUS | Status: DC
  Filled 2019-12-21: qty 100

## 2019-12-21 MED ORDER — LACTATED RINGERS IV SOLN: INTRAVENOUS | Status: DC

## 2019-12-21 NOTE — Progress Notes (Signed)
Pt began complaining of 10/10 CP at 0910 when she sat on the side of the bed with PT. Pain was described as radiating to her hand / as intense pressure in the middle of her chest.   Pt BP was elevated 191/115, STAT EKG obtained, 4L O2 applied, SL Nitroglycerin given x3 doses, with CP then improved to 7/10 but still extremely painful for pt. MD notified and STAT trop ordered. Received orders to continue monitoring pt.   As of 10:17 pts CP is now improved to a 5/10 and BP 173/156, MD notified of BP - pt resting in bed with eyes closed, no distress at this time.

## 2019-12-21 NOTE — Progress Notes (Signed)
Subjective:   No acute events overnight   Angela Gardner reports that she feels a "knot" on the left side of her chest but denies shortness of breath, nausea, vomiting, dizziness,light headedness but does endorse increased pain with palpation.   She does state of the desire to quit using cocaine but her environment does not promote it. We did make her aware that our social workers will be offering assistance in regards to community housing resources.   Objective:  Vital signs in last 24 hours: Vitals:   12/20/19 1555 12/20/19 2041 12/21/19 0056 12/21/19 0417  BP: (!) 165/85 138/74 (!) 190/99 (!) 146/83  Pulse: (!) 57 (!) 57 69 (!) 56  Resp: 13 15 20 17   Temp: 98.1 F (36.7 C) 98.3 F (36.8 C) 98.3 F (36.8 C) 98.6 F (37 C)  TempSrc: Oral Oral Oral Oral  SpO2: 97% 95% 98% 98%  Weight: 108.4 kg   108.6 kg  Height: 5\' 4"  (1.626 m)       Physical Exam Constitutional: no acute distress Head: atraumatic ENT: external ears normal Eyes: EOMI Cardiovascular: regular rate and rhythm, normal heart sounds Pulmonary: effort normal, normal breath sounds bilaterally Abdominal: flat, nontender, no rebound tenderness, bowel sounds normal Musculoskeletal: tenderness to palpation over sternum and L chest Skin: warm and dry Neurological: alert, no focal deficit Psychiatric: normal mood and affect  Assessment/Plan: Angela Gardner is a 59 y.o. female with hx of HTN, HFpEF, uterine CA s/p chemotherapy and surgery in 2019, CKD stage III presenting with CP which is constant and sharp for 3 days. It is reproducible with palpation. History also significant for crack cocaine, last 3 days ago.  Principal Problem:   Chest pain Active Problems:   Morbid obesity ([place])   Essential hypertension   Diastolic congestive heart failure, NYHA class 3 ([facility])   Chronic kidney disease (CKD), stage III (moderate)   Illicit drug use   Left knee pain  Chest Pain Hx of HTN, HFpEF, and crack cocaine use.  Received ASA 325 in ED> EKG demonstrating bradycardia but no ischemic changes. CXR unremarkable. Troponins 28 initially then 21. Echo demonstrating no significant changes from prior. At this point, we believe her CP is most likely 2/2 cocaine use. This was last used 3 days ago to treat her chronic knee pain, and due to social stressors with her living situation. Plan: -counseled on drug cessation, she cites difficult living situation as an inciting factor -social work consult to assist with living situation  Dizziness, bradycardia Patient has had chronic dizziness. She often skips medication doses because she suspects that one of them is causing her dizziness. We are holding her home lopressor and hydralazine, and giving norvasc. She reports some improvement in dizziness. Orthostatic vitals are negative. Pulse was 40-50s on admission, now 50-70s. TSH 4.0. Pain recurred while walking today. Plan: -continue holding lopressor and hydralazine -upon disharge, continue her home hydralazine and furosemide -PT -f/u with PCP about medication adjustment -recheck troponin, consult cardiology if elevated  HTN Chronic HTN. At home takes lopressor, hydralazine, norvasc, and lasix. Holding lopressor and hydralazine, administered 1 dose of lasix since admission. BP has been elevated, 130-190 / 70-90. Plan: -continue meds as above -upon discharge, continue her home norvasc, hydralazine, and furosemide -hold lopressor for now -f/u with PCP  HFpEF Mild BNP elevation to 109. Echo similar to prior. EF 60%, LV hypertrophy, consistent with grade 2 diastolic dysfunction. Plan: -received Lasix IV 20mg  yesterday -resume Lasix outpatient  GERD Patient presents with  new symptoms of epigastric burning pain. Started on Protonix, patient reports no change in symptoms so far. Plan: -continue Protonix  Abd pain Diffuse abdominal pain for several weeks in the setting of hx uterine CA s/p chemotherapy and surgery in  2019. She has good follow up with her gyn/onc and has an upcoming appointment Plan:  -follow up with gyn onc Dr. Denman George on 7/18   Diet: heart healthy IVF: none VTE: Lovenox Prior to Admission Living Arrangement: home Anticipated Discharge Location: home Barriers to Discharge: none Dispo: Expected discharge today  Andrew Au, MD 12/21/2019, 6:46 AM Pager: [phone] After 5pm on weekdays and 1pm on weekends: On Call pager [phone]

## 2019-12-21 NOTE — Evaluation (Signed)
Physical Therapy Evaluation Patient Details Name: Angela Gardner MRN: [id number] DOB: [birth year] Today's Date: 12/21/2019   History of Present Illness  Pt is a 59 yo female presenting with worsening chest pain 2 days PTA. PMH includes: chronic cough and dyspnea on exertion, CHF, HTN, CKD III, cancer, and cocaine use.  Clinical Impression  Pt in bed upon arrival of PT, agreeable to evaluation at this time. Prior to admission the pt was independent with intermittent use of RW or SPC for mobility, and independent with ADLs. The pt now presents with limitations in functional mobility, stability, activity tolerance, and strength due to above dx, and will continue to benefit from skilled PT to address these deficits. Today's session was significantly limited due to elevated BP and onset of chest pain with transition to sitting EOB. The pt also complains of R hand numbness with onset of chest pain, returned to supine position to await assessment by RN. The pt will continue to benefit from skilled PT acutely and following d/c to maximize return to safety and independence with mobiltiy.   VITALS: BP (prior to PT arrival): 193/108 (133) BP (supine): 171/94 (115) BP (seated EOB): 171/109 (126) *pt reports onset of chest pain and numbness in R hand) BP (supine): 190/115(138)     Follow Up Recommendations SNF    Equipment Recommendations   (pt reports having walker and BSC)    Recommendations for Other Services       Precautions / Restrictions Precautions Precautions: Fall Precaution Comments: watch BP Restrictions Weight Bearing Restrictions: No      Mobility  Bed Mobility Overal bed mobility: Needs Assistance Bed Mobility: Supine to Sit;Sit to Supine     Supine to sit: Supervision Sit to supine: Supervision   General bed mobility comments: pt able to come to sitting EOB with extra time and use of bed rail. able to reposition in bed without assist  Transfers Overall transfer level:  Needs assistance Equipment used: None Transfers: Lateral/Scoot Transfers          Lateral/Scoot Transfers: Supervision General transfer comment: pt able to scoot up to [facility] prior to returning to supine. BP 171/109 (126)  with onset of chest pain and R hand numbness in sitting. further transfers declined at this time      Balance Overall balance assessment: Needs assistance Sitting-balance support: No upper extremity supported;Feet supported Sitting balance-Leahy Scale: Good Sitting balance - Comments: supervision for safety                                     Pertinent Vitals/Pain Pain Assessment: Faces Faces Pain Scale: Hurts little more Pain Location: back and L knee, chest pain with movement to sitting EOB Pain Descriptors / Indicators: Discomfort;Grimacing Pain Intervention(s): Limited activity within patient's tolerance;Monitored during session;Repositioned (RN alerted)    Home Living Family/patient expects to be discharged to:: Private residence Living Arrangements: Other relatives Available Help at Discharge: Family (sister) Type of Home: Other(Comment) (trailer) Home Access: Level entry     Home Layout: One level Home Equipment: Environmental consultant - 4 wheels;Cane - single point;Bedside commode Additional Comments: pt reports hole in shower floor that she covers with mat to take shower    Prior Function Level of Independence: Independent with assistive device(s)         Comments: pt reports using rollator whenworking in kitchen, or outside of house. reports use of cane for trips to the bathroom, but  that often she does not use AD in the home due to holes in the floor.     Hand Dominance   Dominant Hand: Right    Extremity/Trunk Assessment   Upper Extremity Assessment Upper Extremity Assessment: Generalized weakness (onset R hand numbness with sitting EOB)    Lower Extremity Assessment Lower Extremity Assessment: Generalized weakness    Cervical /  Trunk Assessment Cervical / Trunk Assessment: Kyphotic  Communication   Communication: No difficulties  Cognition Arousal/Alertness: Awake/alert Behavior During Therapy: WFL for tasks assessed/performed;Flat affect Overall Cognitive Status: Within Functional Limits for tasks assessed                                 General Comments: pt pleasant and cooperative through session      General Comments General comments (skin integrity, edema, etc.): BP 193/108 (133) prior to session, 171/94 (115) supine upon entry of PT, 171/109 (126) sitting EOB, 190/115 supine back in bed. Pt reports onset of chest pain with sitting EOB and R hand numbness.    Exercises     Assessment/Plan    PT Assessment Patient needs continued PT services  PT Problem List Decreased strength;Decreased mobility;Decreased activity tolerance;Decreased balance;Pain;Cardiopulmonary status limiting activity       PT Treatment Interventions DME instruction;Therapeutic exercise;Gait training;Stair training;Functional mobility training;Therapeutic activities;Patient/family education;Balance training    PT Goals (Current goals can be found in the Care Plan section)  Acute Rehab PT Goals Patient Stated Goal: reduce pain PT Goal Formulation: With patient Time For Goal Achievement: 01/04/20 Potential to Achieve Goals: Fair    Frequency Min 2X/week   Barriers to discharge   poor living situation  described by EMS in chart.       AM-PAC PT "6 Clicks" Mobility  Outcome Measure Help needed turning from your back to your side while in a flat bed without using bedrails?: A Little Help needed moving from lying on your back to sitting on the side of a flat bed without using bedrails?: A Little Help needed moving to and from a bed to a chair (including a wheelchair)?: A Lot Help needed standing up from a chair using your arms (e.g., wheelchair or bedside chair)?: A Lot Help needed to walk in hospital room?: A  Lot Help needed climbing 3-5 steps with a railing? : A Lot 6 Click Score: 14    End of Session   Activity Tolerance: Treatment limited secondary to medical complications (Comment) (elevated BP with onset of chest pain) Patient left: in bed;with nursing/sitter in room;with call bell/phone within reach Nurse Communication: Mobility status (BPs) PT Visit Diagnosis: Muscle weakness (generalized) (M62.81);Difficulty in walking, not elsewhere classified (R26.2);Pain Pain - Right/Left: Left Pain - part of body:  (chest)    Time: 3845-3646 PT Time Calculation (min) (ACUTE ONLY): 25 min   Charges:   PT Evaluation $PT Eval Moderate Complexity: 1 Mod PT Treatments $Therapeutic Activity: 8-22 mins       Karma Ganja, PT, DPT   Acute Rehabilitation Department Pager #: [phone]  Otho Bellows 12/21/2019, 10:05 AM

## 2019-12-21 NOTE — Discharge Summary (Addendum)
Name: Angela Gardner MRN: [id number] DOB: 1960/11/26 59 y.o. PCP: Charlott Rakes, MD  Date of Admission: 12/19/2019  8:03 PM Date of Discharge: 12/21/19 Attending Physician: Oda Kilts, MD  Discharge Diagnosis: 1. Cocaine-induced chest pain 2. Dizziness secondary to metoprolol 3. Heart failure with preserved ejection fraction 4. Essential hypertension  Discharge Medications: Allergies as of 12/21/2019       Reactions   Vicodin [hydrocodone-acetaminophen] Itching        Medication List     STOP taking these medications    metoprolol tartrate 50 MG tablet Commonly known as: LOPRESSOR       TAKE these medications    acetaminophen 500 MG tablet Commonly known as: TYLENOL Take 1,000 mg by mouth every 6 (six) hours as needed for moderate pain.   amLODipine 10 MG tablet Commonly known as: NORVASC Take 1 tablet (10 mg total) by mouth daily.   atorvastatin 40 MG tablet Commonly known as: LIPITOR Take 0.5 tablets (20 mg total) by mouth daily.   furosemide 40 MG tablet Commonly known as: LASIX Take 1 tablet (40 mg total) by mouth 2 (two) times daily.   hydrALAZINE 50 MG tablet Commonly known as: APRESOLINE Take 1.5 tablets (75 mg total) by mouth 3 (three) times daily. What changed: how much to take   pantoprazole 40 MG tablet Commonly known as: PROTONIX Take 1 tablet (40 mg total) by mouth daily. Start taking on: December 22, 2019        Disposition and follow-up:   Angela Gardner was discharged from [facility] in Good condition.  At the hospital follow up visit please address:  1.  Follow up  A. Chest Pain - Most likely secondary to cocaine use. She attributes this use to her social situation. Workup here otherwise benign.  B. Dizziness - We held her Lopressor due to symptomatic bradycardia. Pulse 50-70s at time of discharge.  C. HTN - BP elevated up to 170s at time of discharge. We resumed all her BP meds except Lopressor, and  increased her hydralazine to 75mg  TID. Consider switching hydralazine to Imdur.  D. GERD - patient was started on Protonix for GERD symptoms  E. HFpEF - echo this admission comparable to last in Feb 2020. New TTE demonstrated EF 60%, LV hypertrophy, consistent with grade 2 diastolic dysfunction. Discharging her on her home dose of Lasix.  2.  Labs / imaging needed at time of follow-up: none  3.  Pending labs/ test needing follow-up: none  Follow-up Appointments:     Hospital Course by problem list: 1. Chest pain Angela Gardner was admitted after 3 days of chest pain that was described as constant and sharp. Denied nausea, jaw pain, arm pain, or back pain. She received ASA 325 in the ED. This had resolved by the time that our team saw her for admission. EKG revealed sinus bradycardia, with no evidence of ischemic change. Initial high sensitivity troponin 28, next was 21. CXR without acute findings. Echo comparable to prior echo, with EF 60% and LV hypertrophy consistent with grade 2 diastolic dysfunction. UDS positive for cocaine. Patient reported cocaine use last 3 days ago prior to onset of chest pain. She reports chronic knee pain and difficult living situation as inciting factors for her drug use. On morning of discharge, she again reported chest pain which was mild, substernal, sharp, and reproducible with palpation. She had increased pain when walking, repeat high sensitivity troponin was 12, so she was deemed stable for discharge.  2. Dizziness, bradycardia She has had ongoing dizziness leading to several falls. She suspects this is due to one of her medications, and therefore often skips doses. Meds include metoprolol, lasix, hydralazine, and norvasc. EKG with sinus bradycardia. We held her lopressor and hydralazine while inpatient, and reduced her lasix dose. She reports improved symptoms at time of discharge, though not resolved. Her pulse upon admission was 40-50s, and 60s-70s upon  discharge. We will restart her other BP meds and increase her hydralazine but continue holding her lopressor at discharge.   3. HFpEF Patient with hx of chronic HFpEF. BNP slightly elevated at 108. TTE here was comparable to prior in 07/2019, see impressions below. EF 60%, LV hypertrophy, consistent with grade 2 diastolic dysfunction. Asymptomatic from her HFpEF at this time. Discharging her on her home dose of Lasix.   Discharge Vitals:   BP (!) 173/156 (BP Location: Left Arm)   Pulse 75   Temp 98.7 F (37.1 C) (Oral)   Resp 18   Ht 5\' 4"  (1.626 m)   Wt 108.6 kg   LMP  (LMP Unknown) Comment: vaginal bleeding continuously for last 8 months  SpO2 100%   BMI 41.09 kg/m   Pertinent Labs, Studies, and Procedures:  TTE 12/20/2019 IMPRESSIONS:  1. Left ventricular ejection fraction, by estimation, is 60%. The left  ventricle has normal function. The left ventricle has no regional wall  motion abnormalities. There is moderate left ventricular hypertrophy. Left  ventricular diastolic parameters are   consistent with Grade II diastolic dysfunction (pseudonormalization).   2. Right ventricular systolic function is normal. The right ventricular  size is normal. Tricuspid regurgitation signal is inadequate for assessing  PA pressure.   3. The mitral valve is normal in structure. Trivial mitral valve  regurgitation. No evidence of mitral stenosis.   4. The aortic valve is tricuspid. Aortic valve regurgitation is not  visualized. No aortic stenosis is present.   5. The inferior vena cava is normal in size with greater than 50%  respiratory variability, suggesting right atrial pressure of 3 mmHg.   DG Knee AP/LAT W/Sunrise Left Result Date: 12/20/2019 CLINICAL DATA:  Recent fall with left knee pain, initial encounter EXAM: LEFT KNEE 3 VIEWS COMPARISON:  None. FINDINGS: Degenerative changes are noted in all 3 joint compartments. No joint effusion is seen. No acute fracture or dislocation is noted.  IMPRESSION: Degenerative change without acute abnormality. Electronically Signed   By: Inez Catalina M.D.   On: 12/20/2019 10:49     Discharge Instructions: Discharge Instructions     Call MD for:  extreme fatigue   Complete by: As directed    Call MD for:  persistant dizziness or light-headedness   Complete by: As directed    Diet - low sodium heart healthy   Complete by: As directed    Discharge instructions   Complete by: As directed    It was a pleasure taking care of Ms. Madera. Here are my discharge instructions for you.   1. Stop taking your metoprolol. 2. Increase the dose of your hydralazine to 75mg  TID 3. Resume your other medications, including your Norvasc and Lasix. 4. Start taking Protonix 40mg  daily for your heartburn. 5. Follow up with your PCP 6. Follow up with your cardiologist 7. Follow up with your gynecologic oncologist   Increase activity slowly   Complete by: As directed      It was a pleasure taking care of Ms. Tonner. Here are my discharge instructions for you.  1. Stop taking your metoprolol. 2. Increase the dose of your hydralazine to 75mg  TID 3. Resume your other medications, including your Norvasc and Lasix. 4. Start taking Protonix for your heartburn. 5. Follow up with your PCP 6. Follow up with your cardiologist 7. Follow up with your gynecologic oncologist  Signed: Andrew Au, MD 12/21/2019, 2:28 PM   Pager: [phone]

## 2019-12-24 MED FILL — PANTOPRAZOLE SOD DR 40 MG T: 40 | 30 days supply | Qty: 60 | Fill #0

## 2019-12-24 MED FILL — hydrALAZINE HCL 50 MG TABS: 50 | 30 days supply | Qty: 135 | Fill #0

## 2019-12-25 ENCOUNTER — Telehealth: Payer: Self-pay

## 2019-12-25 NOTE — Telephone Encounter (Signed)
Transition Care Management Follow-up Telephone Call  Date of discharge and from where: 12/21/2019, [facility]   How have you been since you were released from the hospital? She said she is " fine, a lot better."   Any questions or concerns?  none at this time  Items Reviewed:  Did the pt receive and understand the discharge instructions provided?  yes  Medications obtained and verified?she said that she has all medications and did not have any questions about the med regime.  She stated that she understands the change with the hydralazine and that she is to stop taking metoprolol.   Any new allergies since your discharge?  none reported  Do you have support at home?  yes, her sister  No home health or DME ordered.   Functional Questionnaire: (I = Independent and D = Dependent) ADLs: independent  Follow up appointments reviewed:   PCP Hospital f/u appt confirmed?Dr Margarita Rana 01/07/2020 @ [facility] f/u appt confirmed?oncology - 01/06/2020; cardiology - 02/21/2020  Are transportation arrangements needed? no, she said that she tries to arrange appts when her sister is off.  Reminded her that she has medicaid and can register for medicaid transportation to medical appts.   If their condition worsens, is the pt aware to call PCP or go to the Emergency Dept.? yes  Was the patient provided with contact information for the PCP's office or ED?  she has the clinic phone number  Was to pt encouraged to call back with questions or concerns?  yes

## 2019-12-25 NOTE — Telephone Encounter (Signed)
From the discharge call.  She has an appt with Dr Margarita Rana 01/07/2020  she said that she has all medications and did not have any questions about the med regime.  She stated that she understands the change with the hydralazine and that she is to stop taking metoprolol.   No questions or concerns at this time. She said she is feeling fine, much better.

## 2019-12-30 ENCOUNTER — Telehealth: Payer: Self-pay

## 2019-12-30 ENCOUNTER — Ambulatory Visit (HOSPITAL_BASED_OUTPATIENT_CLINIC_OR_DEPARTMENT_OTHER): Admission: RE | Admit: 2019-12-30 | Payer: Medicaid Other | Source: Ambulatory Visit

## 2019-12-30 NOTE — Telephone Encounter (Signed)
Pearl from [place] echo department is calling wanting to know if the scheduled echo for this patient is necessary? It looks like the patient has had another echo recently.  It is being done today and she would like a call back ASAP.  [phone]

## 2019-12-30 NOTE — Telephone Encounter (Signed)
Called spoke to Angela Gardner advised her that the patient shouldn't need another echo since one was done this month. Will let Dr. Agustin Cree know so he can view results on the most recent one.

## 2020-01-06 ENCOUNTER — Inpatient Hospital Stay: Payer: Medicaid Other | Admitting: Gynecologic Oncology

## 2020-01-06 ENCOUNTER — Telehealth: Payer: Self-pay

## 2020-01-06 NOTE — Telephone Encounter (Signed)
-----   Message from [facility] sent at 01/06/2020 10:35 AM EDT ----- Regarding: GYN patient. Scheduling Message Entered by Steva Colder, CHABELY on 01/06/2020 at 10:28 AM Priority: Routine <No visit type provided> Department: [facility] Provider:  Scheduling Notes: needs to reschedule todays apt

## 2020-01-06 NOTE — Telephone Encounter (Signed)
LM for Ms Quigg to call back to the office to r/s office visit.

## 2020-01-07 ENCOUNTER — Other Ambulatory Visit: Payer: Self-pay

## 2020-01-07 ENCOUNTER — Encounter: Payer: Self-pay | Admitting: Family Medicine

## 2020-01-07 ENCOUNTER — Ambulatory Visit: Payer: Medicaid Other | Attending: Family Medicine | Admitting: Family Medicine

## 2020-01-07 DIAGNOSIS — I10 Essential (primary) hypertension: Secondary | ICD-10-CM | POA: Diagnosis not present

## 2020-01-07 DIAGNOSIS — I5032 Chronic diastolic (congestive) heart failure: Secondary | ICD-10-CM

## 2020-01-07 DIAGNOSIS — J069 Acute upper respiratory infection, unspecified: Secondary | ICD-10-CM | POA: Diagnosis not present

## 2020-01-07 NOTE — Progress Notes (Signed)
States that she is still feeling sick.  She has pain in her throat.

## 2020-01-07 NOTE — Telephone Encounter (Signed)
LM for patient to call back and r/s cancelled appointment with Dr. Denman George from yesterday.

## 2020-01-07 NOTE — Progress Notes (Signed)
Virtual Visit via Telephone Note  I connected with Angela Gardner, on 01/07/2020 at 11:58 AM by telephone due to the COVID-19 pandemic and verified that I am speaking with the correct person using two identifiers.   Consent: I discussed the limitations, risks, security and privacy concerns of performing an evaluation and management service by telephone and the availability of in person appointments. I also discussed with the patient that there may be a patient responsible charge related to this service. The patient expressed understanding and agreed to proceed.   Location of Patient: Home  Location of Provider: Clinic   Persons participating in Telemedicine visit: Mckenzye Cutright Farrington-CMA Dr. Margarita Rana     History of Present Illness: Angela Gardner is a 59 year old female with a history of hypertension, stage III chronic kidney disease, diastolic CHF (EF 60 to 53% from 07/2018), Endometrial carcinoma diagnosed in 09/2017 s/p total abdominal hysterectomy and bilateral salpingo-oophorectomy on 11/14/2017, status post radiation and chemotherapy who presents today for follow-up visit. She was hospitalized at [facility] from 12/19/2019 through 12/21/2019 for chest pain thought to be cocaine induced.  Troponins were initially elevated at 28, 21 then normalized to 12, EKG revealed to be of abnormality, LVH, prolonged QT. Echo revealed EF of 60%, no regional wall motion abnormality, moderate LVH, grade 2 DD.  She was placed on Protonix and subsequently discharged  She has not had chest pain since discharge. After she got home form the hospital she developed a cold which she thinks she got from her daughter She has been coughing with production of clear sputum. She has no sinus pressure or pain but has sore throat and pain on coughing. Using cough drops, vicks vapor rub and she gets hot and cold. She has no fever.  Past Medical History:  Diagnosis Date  . Anemia   . 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 CHF (congestive heart failure) ([place])   . Dyspnea    still having this and not moving around alot -gets worse with exertion  . History of sleep walking   . Hx of left breast biopsy   . Hyperlipemia   . Hypertension    not on any medication now-[facility] had  prescribed her a med and she stopped taking them cannot remember when  . Renal disorder    Allergies  Allergen Reactions  . Vicodin [Hydrocodone-Acetaminophen] Itching    Current Outpatient Medications on File Prior to Visit  Medication Sig Dispense Refill  . acetaminophen (TYLENOL) 500 MG tablet Take 1,000 mg by mouth every 6 (six) hours as needed for moderate pain.     Marland Kitchen atorvastatin (LIPITOR) 40 MG tablet Take 0.5 tablets (20 mg total) by mouth daily. 30 tablet 5  . furosemide (LASIX) 40 MG tablet Take 1 tablet (40 mg total) by mouth 2 (two) times daily. 180 tablet 1  . hydrALAZINE (APRESOLINE) 50 MG tablet Take 1.5 tablets (75 mg total) by mouth 3 (three) times daily. 270 tablet 1  . pantoprazole (PROTONIX) 40 MG tablet Take 1 tablet (40 mg total) by mouth daily. 60 tablet 0  . amLODipine (NORVASC) 10 MG tablet Take 1 tablet (10 mg total) by mouth daily. 90 tablet 1  . [DISCONTINUED] prochlorperazine (COMPAZINE) 10 MG tablet Take 1 tablet (10 mg total) by mouth every 6 (six) hours as needed (Nausea or vomiting). (Patient not taking: Reported on 05/25/2018) 30 tablet 1   No current facility-administered medications on file prior to visit.  Observations/Objective: Awake, alert, oriented x3 Not in acute distress  Assessment and Plan: 1. Essential hypertension Controlled Continue hydralazine, amlodipine Counseled on blood pressure goal of less than 130/80, low-sodium, DASH diet, medication compliance, 150 minutes of moderate intensity exercise per week. Discussed medication compliance, adverse effects.   2. Viral upper respiratory tract infection No indication for antibiotic at this  time Continue Mucinex and OTC decongestants  3. Chronic diastolic congestive heart failure, NYHA class 3 (HCC) Euvolemic EF of 60% from echo of 12/2019   Follow Up Instructions: Keep previously scheduled appointment  I discussed the assessment and treatment plan with the patient. The patient was provided an opportunity to ask questions and all were answered. The patient agreed with the plan and demonstrated an understanding of the instructions.   The patient was advised to call back or seek an in-person evaluation if the symptoms worsen or if the condition fails to improve as anticipated.     I provided 15 minutes total of non-face-to-face time during this encounter including median intraservice time, reviewing previous notes, investigations, ordering medications, medical decision making, coordinating care and patient verbalized understanding at the end of the visit.     Charlott Rakes, MD, FAAFP. [facility] [place], [place]   01/07/2020, 11:58 AM

## 2020-01-10 NOTE — Telephone Encounter (Signed)
LM for patient to call back and r/s cancelled appointment with Dr. Denman George. From 01-06-20.

## 2020-01-20 NOTE — Telephone Encounter (Signed)
LM to call Dr. Serita Grit office at [phone] to r/s her cancelled appointment from 01-06-20.

## 2020-01-22 ENCOUNTER — Telehealth: Payer: Self-pay | Admitting: *Deleted

## 2020-01-22 NOTE — Telephone Encounter (Signed)
Patient called and rescheduled hr missed appt from July. Appt scheduled for 8/6

## 2020-01-24 ENCOUNTER — Other Ambulatory Visit: Payer: Self-pay

## 2020-01-24 ENCOUNTER — Encounter: Payer: Self-pay | Admitting: Gynecologic Oncology

## 2020-01-24 ENCOUNTER — Inpatient Hospital Stay: Payer: Medicaid Other | Attending: Gynecologic Oncology | Admitting: Gynecologic Oncology

## 2020-01-24 VITALS — BP 175/107 | HR 85 | Temp 98.7°F | Resp 18 | Ht 64.0 in | Wt 242.6 lb

## 2020-01-24 DIAGNOSIS — Z79899 Other long term (current) drug therapy: Secondary | ICD-10-CM | POA: Insufficient documentation

## 2020-01-24 DIAGNOSIS — Z90722 Acquired absence of ovaries, bilateral: Secondary | ICD-10-CM | POA: Insufficient documentation

## 2020-01-24 DIAGNOSIS — Z90710 Acquired absence of both cervix and uterus: Secondary | ICD-10-CM | POA: Diagnosis not present

## 2020-01-24 DIAGNOSIS — C541 Malignant neoplasm of endometrium: Secondary | ICD-10-CM | POA: Diagnosis not present

## 2020-01-24 DIAGNOSIS — Z87891 Personal history of nicotine dependence: Secondary | ICD-10-CM | POA: Diagnosis not present

## 2020-01-24 DIAGNOSIS — Z923 Personal history of irradiation: Secondary | ICD-10-CM | POA: Diagnosis not present

## 2020-01-24 DIAGNOSIS — Z9221 Personal history of antineoplastic chemotherapy: Secondary | ICD-10-CM | POA: Diagnosis not present

## 2020-01-24 NOTE — Patient Instructions (Addendum)
Please notify Dr Denman George at phone number [phone] if you notice vaginal bleeding, new pelvic or abdominal pains, bloating, feeling full easy, or a change in bladder or bowel function.   Please return to see Dr Sondra Come as scheduled.  Please have Dr Clabe Seal office contact Dr Serita Grit office (at [phone]) in October after your visit to request an appointment with her for May, 2022.

## 2020-01-24 NOTE — Progress Notes (Signed)
Follow-up Note: Gyn-Onc  Consult was requested by Dr. Hulan Fray for the evaluation of Angela Gardner 59 y.o. female  CC:  Chief Complaint  Patient presents with  . Endometrial cancer [facility])    Assessment/Plan:  Angela Gardner  is a 59 y.o.  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 High risk factors for recurrence given the unfavorable cell type.  S/p adjuvant therapy with 6cycles of carb/taxol and vaginal brachytherapy completed November, 2019.  No evidence of recurrence on examination. Recommend follow-up with Dr Sondra Come in 3 months and myself in 9 months.   HPI: Angela Gardner is a 59 year old P1 who is seen in consultation at the request of Dr Hulan Fray for grade 3 endometrial cancer.   The patient transitioned through menopause prior to age 26.  She had an episode of postmenopausal bleeding in approximately 2017 at which time she had a D&C procedure revealing benign pathology.  She stopped vaginal bleeding until approximately November 2018 when she began having heavy menstrual-like bleeding and was started on Megace.  This helped some however bleeding persisted and therefore she underwent a transvaginal ultrasound scan on 09/15/2017 this revealed a uterus measuring 11.2 x 5 x 6.9 cm with a subserosal fundal fibroid measuring up to 3.8 cm.  The endometrial thickness was 7 mm with concern for an echogenic mass centrally within the endometrium measuring 3.8 x 3.8 x 3.6 cm.  She then underwent an endometrial Pipelle biopsy on 10/09/2017 which revealed high-grade adenocarcinoma.  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  Due to the finding of a high-grade endometrial cancer that she underwent a CT scan of the chest abdomen and pelvis on September 12, 2017 this revealed grossly unremarkable uterus and ovaries, no lymphadenopathy, no gross evidence of extrauterine disease or metastases.  On  11/14/17 she underwent robotically assisted total hysterectomy, BSO, SLN biopsy. Postoperatively her recovery was complicated by dizziness, dehydration and azotemia requiring an additional night's stay in the hospital. This resolved with hydration. Preop urine tox screen was positive for cocaine.  Final pathology revealed a 2.3cm high grade serous endometrial cancer with sarcomatoid features. The carcinoma invades 0.5cm of 3cm in myometrium. Lymph nodes (left sentinel, right pelvic) were negative. No LVSI was present. No cervical or adnexal involvement. She was determined to have stage IA high grade serous carcinoma of the uterus and was recommended to receive adjuvant therapy in the form of 6 cycles of carboplatin and paclitaxel and vaginal brachytherapy in accordance with NCCN guidelines.   On 12/21/17 the patient began experiencing leakage of clear fluid from the vagina. She was seen in the [place] [facility] on 12/22/17 and a CT urogram with retrograde cystogram was performed. It showed no extravasation of contrast around the kidneys, from the ureters or around the vagina. There was a 2cm collection of fluid adjacent to the vagina. No definitive fistula/leak was identified. The leakage of fluid spontaneously resolved.   She went on to complete 6 cycles of carboplatin paclitaxel chemotherapy with Dr. Alvy Bimler between June and November 2019.       She also completed 30 Gy of vaginal brachytherapy with Dr. Sondra Come in 5 fractions completed in November 2019.  Interval Hx:  She has no complaints and denies persistent toxicity of therapy.  She has recently been treated for cocaine-induced chest pain.    Current Meds:  Outpatient Encounter Medications as of 01/24/2020  Medication  Sig  . acetaminophen (TYLENOL) 500 MG tablet Take 1,000 mg by mouth every 6 (six) hours as needed for moderate pain.   Marland Kitchen atorvastatin (LIPITOR) 40 MG tablet Take 0.5 tablets (20 mg total) by mouth daily.  . furosemide (LASIX) 40 MG  tablet Take 1 tablet (40 mg total) by mouth 2 (two) times daily.  . hydrALAZINE (APRESOLINE) 50 MG tablet Take 1.5 tablets (75 mg total) by mouth 3 (three) times daily.  . pantoprazole (PROTONIX) 40 MG tablet Take 1 tablet (40 mg total) by mouth daily.  Marland Kitchen amLODipine (NORVASC) 10 MG tablet Take 1 tablet (10 mg total) by mouth daily.  . [DISCONTINUED] prochlorperazine (COMPAZINE) 10 MG tablet Take 1 tablet (10 mg total) by mouth every 6 (six) hours as needed (Nausea or vomiting). (Patient not taking: Reported on 05/25/2018)   No facility-administered encounter medications on file as of 01/24/2020.    Allergy:  Allergies  Allergen Reactions  . Vicodin [Hydrocodone-Acetaminophen] Itching    Social Hx:   Social History   Socioeconomic History  . Marital status: Single    Spouse name: Not on file  . Number of children: 1  . Years of education: Not on file  . Highest education level: 9th grade  Occupational History  . Not on file  Tobacco Use  . Smoking status: Former Smoker    Packs/day: 0.30    Years: 26.00    Pack years: 7.80    Types: Cigarettes  . Smokeless tobacco: Never Used  . Tobacco comment: quit May 2019  Vaping Use  . Vaping Use: Never used  Substance and Sexual Activity  . Alcohol use: Not Currently    Comment: social drinking  . Drug use: Yes    Types: Cocaine, "Crack" cocaine  . Sexual activity: Not Currently    Birth control/protection: Post-menopausal  Other Topics Concern  . Not on file  Social History Narrative  . Not on file   Social Determinants of Health   Financial Resource Strain:   . Difficulty of Paying Living Expenses:   Food Insecurity:   . Worried About Charity fundraiser in the Last Year:   . Arboriculturist in the Last Year:   Transportation Needs:   . Film/video editor (Medical):   Marland Kitchen Lack of Transportation (Non-Medical):   Physical Activity:   . Days of Exercise per Week:   . Minutes of Exercise per Session:   Stress:   .  Feeling of Stress :   Social Connections:   . Frequency of Communication with Friends and Family:   . Frequency of Social Gatherings with Friends and Family:   . Attends Religious Services:   . Active Member of Clubs or Organizations:   . Attends Archivist Meetings:   Marland Kitchen Marital Status:   Intimate Partner Violence:   . Fear of Current or Ex-Partner:   . Emotionally Abused:   Marland Kitchen Physically Abused:   . Sexually Abused:     Past Surgical Hx:  Past Surgical History:  Procedure Laterality Date  . BREAST BIOPSY     left breast  . CESAREAN SECTION     one  . CHOLECYSTECTOMY    . DILATION AND CURETTAGE OF UTERUS     approx 1 year ago  . IR IMAGING GUIDED PORT INSERTION  12/06/2017  . IR REMOVAL TUN ACCESS W/ PORT W/O FL MOD SED  06/19/2018  . LYMPH NODE BIOPSY N/A 11/14/2017   Procedure: SENTINEL LYMPH NODE BIOPSY;  Surgeon: Everitt Amber, MD;  Location: [facility];  Service: Gynecology;  Laterality: N/A;  . ROBOTIC ASSISTED TOTAL HYSTERECTOMY WITH BILATERAL SALPINGO OOPHERECTOMY N/A 11/14/2017   Procedure: XI ROBOTIC ASSISTED TOTAL HYSTERECTOMY WITH BILATERAL SALPINGO OOPHORECTOMY, RIGHT PELVIC LYMPHADENECTOMY;  Surgeon: Everitt Amber, MD;  Location: [facility];  Service: Gynecology;  Laterality: N/A;    Past Medical Hx:  Past Medical History:  Diagnosis Date  . Anemia   . 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 CHF (congestive heart failure) ([place])   . Dyspnea    still having this and not moving around alot -gets worse with exertion  . History of sleep walking   . Hx of left breast biopsy   . Hyperlipemia   . Hypertension    not on any medication now-[facility] had  prescribed her a med and she stopped taking them cannot remember when  . Renal disorder     Past Gynecological History:  C/s x 1, positive high risk HPV in 2019. No LMP recorded (lmp unknown). Patient has had a hysterectomy.  Family Hx:  Family History  Problem Relation Age of Onset  . Anuerysm Mother   .  Congestive Heart Failure Mother   . Hypertension Father   . Hypertension Sister   . Hypertension Brother   . Colon cancer Neg Hx     Review of Systems:  Constitutional  Feels well,    ENT Normal appearing ears and nares bilaterally Skin/Breast  No rash, sores, jaundice, itching, dryness Cardiovascular  No chest pain, shortness of breath, or edema  Pulmonary  No cough or wheeze.  Gastro Intestinal  No nausea, vomitting, or diarrhoea. No bright red blood per rectum, no abdominal pain, change in bowel movement, or constipation.  Genito Urinary  No frequency, urgency, dysuria, no leakage of blood or fluid.    Musculo Skeletal  No myalgia, arthralgia, joint swelling or pain  Neurologic  No weakness, numbness, change in gait,  Psychology  No depression, anxiety, insomnia.   Vitals:  Blood pressure (!) 175/107, pulse 85, temperature 98.7 F (37.1 C), temperature source Temporal, resp. rate 18, height 5\' 4"  (1.626 m), weight 242 lb 9.6 oz (110 kg), SpO2 100 %.  Physical Exam: WD in NAD Neck  Supple NROM, without any enlargements.  Lymph Node Survey No cervical supraclavicular or inguinal adenopathy Cardiovascular  Pulse normal rate, regularity and rhythm. S1 and S2 normal.  Lungs  Clear to auscultation bilateraly, without wheezes/crackles/rhonchi. Good air movement.  Skin  No rash/lesions/breakdown  Psychiatry  Alert and oriented to person, place, and time  Abdomen  Normoactive bowel sounds, abdomen soft, non-tender and obese without evidence of hernia. Incisions are well healed and soft.  Back No CVA tenderness Genito Urinary  Well healed vaginal cuff, no lesions, no masses, no blood, no drainage. No pelvic masses.  Rectal  deferred Extremities  + subtle left lower extremity edema   Angela Solo, MD  01/24/2020, 3:12 PM

## 2020-02-21 ENCOUNTER — Other Ambulatory Visit: Payer: Self-pay

## 2020-02-21 ENCOUNTER — Encounter: Payer: Self-pay | Admitting: Cardiology

## 2020-02-21 ENCOUNTER — Ambulatory Visit (INDEPENDENT_AMBULATORY_CARE_PROVIDER_SITE_OTHER): Payer: Medicare Other | Admitting: Cardiology

## 2020-02-21 ENCOUNTER — Other Ambulatory Visit: Payer: Self-pay | Admitting: Cardiology

## 2020-02-21 VITALS — BP 190/120 | HR 64 | Ht 64.0 in | Wt 241.0 lb

## 2020-02-21 DIAGNOSIS — N289 Disorder of kidney and ureter, unspecified: Secondary | ICD-10-CM | POA: Diagnosis not present

## 2020-02-21 DIAGNOSIS — I10 Essential (primary) hypertension: Secondary | ICD-10-CM | POA: Diagnosis not present

## 2020-02-21 DIAGNOSIS — N1832 Chronic kidney disease, stage 3b: Secondary | ICD-10-CM | POA: Diagnosis not present

## 2020-02-21 DIAGNOSIS — R079 Chest pain, unspecified: Secondary | ICD-10-CM | POA: Diagnosis not present

## 2020-02-21 DIAGNOSIS — I169 Hypertensive crisis, unspecified: Secondary | ICD-10-CM | POA: Diagnosis not present

## 2020-02-21 MED ORDER — CLONIDINE HCL 0.1 MG PO TABS: 0.1000 mg | ORAL_TABLET | Freq: Every day | ORAL | 1 refills | Status: DC

## 2020-02-21 MED FILL — cloNIDine HCL 0.1 MG TABS: 0.1 | 90 days supply | Qty: 90 | Fill #0

## 2020-02-21 NOTE — Patient Instructions (Signed)
Medication Instructions:  Your physician has recommended you make the following change in your medication:   START: Clonidine 0.1 mg daily   *If you need a refill on your cardiac medications before your next appointment, please call your pharmacy*   Lab Work: Your physician recommends that you return for lab work today: bmp   If you have labs (blood work) drawn today and your tests are completely normal, you will receive your results only by: Marland Kitchen MyChart Message (if you have MyChart) OR . A paper copy in the mail If you have any lab test that is abnormal or we need to change your treatment, we will call you to review the results.   Testing/Procedures: Your physician has requested that you have a renal artery duplex. During this test, an ultrasound is used to evaluate blood flow to the kidneys. Allow one hour for this exam. Do not eat after midnight the day before and avoid carbonated beverages. Take your medications as you usually do.     Follow-Up: At [facility], you and your health needs are our priority.  As part of our continuing mission to provide you with exceptional heart care, we have created designated Provider Care Teams.  These Care Teams include your primary Cardiologist (physician) and Advanced Practice Providers (APPs -  Physician Assistants and Nurse Practitioners) who all work together to provide you with the care you need, when you need it.  We recommend signing up for the patient portal called "MyChart".  Sign up information is provided on this After Visit Summary.  MyChart is used to connect with patients for Virtual Visits (Telemedicine).  Patients are able to view lab/test results, encounter notes, upcoming appointments, etc.  Non-urgent messages can be sent to your provider as well.   To learn more about what you can do with MyChart, go to [email or web address].    Your next appointment:   1 month(s)  The format for your next appointment:   In  Person  Provider:   Jenne Campus, MD   Other Instructions  Clonidine tablets What is this medicine? CLONIDINE (KLOE ni deen) is used to treat high blood pressure. This medicine may be used for other purposes; ask your health care provider or pharmacist if you have questions. COMMON BRAND NAME(S): Catapres What should I tell my health care provider before I take this medicine? They need to know if you have any of these conditions:  kidney disease  an unusual or allergic reaction to clonidine, other medicines, foods, dyes, or preservatives  pregnant or trying to get pregnant  breast-feeding How should I use this medicine? Take this medicine by mouth with a glass of water. Follow the directions on the prescription label. Take your doses at regular intervals. Do not take your medicine more often than directed. Do not suddenly stop taking this medicine. You must gradually reduce the dose or you may get a dangerous increase in blood pressure. Ask your doctor or health care professional for advice. Talk to your pediatrician regarding the use of this medicine in children. Special care may be needed. Overdosage: If you think you have taken too much of this medicine contact a poison control center or emergency room at once. NOTE: This medicine is only for you. Do not share this medicine with others. What if I miss a dose? If you miss a dose, take it as soon as you can. If it is almost time for your next dose, take only that dose. Do not take double  or extra doses. What may interact with this medicine? Do not take this medicine with any of the following medications:  MAOIs like Carbex, Eldepryl, Marplan, Nardil, and Parnate This medicine may also interact with the following medications:  barbiturate medicines for inducing sleep or treating seizures like phenobarbital  certain medicines for blood pressure, heart disease, irregular heart beat  certain medicines for depression, anxiety,  or psychotic disturbances  prescription pain medicines This list may not describe all possible interactions. Give your health care provider a list of all the medicines, herbs, non-prescription drugs, or dietary supplements you use. Also tell them if you smoke, drink alcohol, or use illegal drugs. Some items may interact with your medicine. What should I watch for while using this medicine? Visit your doctor or health care professional for regular checks on your progress. Check your heart rate and blood pressure regularly while you are taking this medicine. Ask your doctor or health care professional what your heart rate should be and when you should contact him or her. You may get drowsy or dizzy. Do not drive, use machinery, or do anything that needs mental alertness until you know how this medicine affects you. To avoid dizzy or fainting spells, do not stand or sit up quickly, especially if you are an older person. Alcohol can make you more drowsy and dizzy. Avoid alcoholic drinks. Your mouth may get dry. Chewing sugarless gum or sucking hard candy, and drinking plenty of water will help. Do not treat yourself for coughs, colds, or pain while you are taking this medicine without asking your doctor or health care professional for advice. Some ingredients may increase your blood pressure. If you are going to have surgery tell your doctor or health care professional that you are taking this medicine. What side effects may I notice from receiving this medicine? Side effects that you should report to your doctor or health care professional as soon as possible:  allergic reactions like skin rash, itching or hives, swelling of the face, lips, or tongue  anxiety, nervousness  chest pain  depression  fast, irregular heartbeat  swelling of feet or legs  unusually weak or tired Side effects that usually do not require medical attention (report to your doctor or health care professional if they  continue or are bothersome):  change in sex drive or performance  constipation  headache This list may not describe all possible side effects. Call your doctor for medical advice about side effects. You may report side effects to FDA at 1-800-FDA-1088. Where should I keep my medicine? Keep out of the reach of children. Store at room temperature between 15 and 30 degrees C (59 and 86 degrees F). Protect from light. Keep container tightly closed. Throw away any unused medicine after the expiration date. NOTE: This sheet is a summary. It may not cover all possible information. If you have questions about this medicine, talk to your doctor, pharmacist, or health care provider.  2020 Elsevier/Gold Standard (2010-12-01 13:01:28)

## 2020-02-21 NOTE — Progress Notes (Signed)
Cardiology Office Note:    Date:  02/21/2020   ID:  Angela Gardner, DOB [birth year], MRN [id number]  PCP:  Angela Rakes, MD  Cardiologist:  Angela Campus, MD    Referring MD: Angela Rakes, MD   Chief Complaint  Patient presents with  . Follow-up  Still have problem with the blood pressure  History of Present Illness:    Angela Gardner is a 59 y.o. female with past medical history significant for hypertension which is difficult to control, preserved left ventricle ejection fraction, hyperlipidemia, diastolic congestive heart failure, comes today to my office for follow-up overall she is doing great denies of any symptoms no chest pain tightness squeezing pressure burning chest.  Recently she ended up going to the emergency room because of chest pain however the feeling was the chest pain was related to cocaine abuse.  Obviously she was told to stop.  Interestingly she did not take her medications this morning because she is worried when she take it she have to urinate a lot.  Her blood pressure is high today.  Past Medical History:  Diagnosis Date  . Anemia   . 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 CHF (congestive heart failure) ([place])   . Dyspnea    still having this and not moving around alot -gets worse with exertion  . History of sleep walking   . Hx of left breast biopsy   . Hyperlipemia   . Hypertension    not on any medication now-[facility] had  prescribed her a med and she stopped taking them cannot remember when  . Renal disorder     Past Surgical History:  Procedure Laterality Date  . BREAST BIOPSY     left breast  . CESAREAN SECTION     one  . CHOLECYSTECTOMY    . DILATION AND CURETTAGE OF UTERUS     approx 1 year ago  . IR IMAGING GUIDED PORT INSERTION  12/06/2017  . IR REMOVAL TUN ACCESS W/ PORT W/O FL MOD SED  06/19/2018  . LYMPH NODE BIOPSY N/A 11/14/2017   Procedure: SENTINEL LYMPH NODE BIOPSY;  Surgeon: Everitt Amber, MD;  Location: [facility];   Service: Gynecology;  Laterality: N/A;  . ROBOTIC ASSISTED TOTAL HYSTERECTOMY WITH BILATERAL SALPINGO OOPHERECTOMY N/A 11/14/2017   Procedure: XI ROBOTIC ASSISTED TOTAL HYSTERECTOMY WITH BILATERAL SALPINGO OOPHORECTOMY, RIGHT PELVIC LYMPHADENECTOMY;  Surgeon: Everitt Amber, MD;  Location: [facility];  Service: Gynecology;  Laterality: N/A;    Current Medications: Current Meds  Medication Sig  . acetaminophen (TYLENOL) 500 MG tablet Take 1,000 mg by mouth every 6 (six) hours as needed for moderate pain.   Marland Kitchen amLODipine (NORVASC) 10 MG tablet Take 1 tablet (10 mg total) by mouth daily.  Marland Kitchen atorvastatin (LIPITOR) 40 MG tablet Take 0.5 tablets (20 mg total) by mouth daily.  . furosemide (LASIX) 40 MG tablet Take 1 tablet (40 mg total) by mouth 2 (two) times daily.  . hydrALAZINE (APRESOLINE) 50 MG tablet Take 1.5 tablets (75 mg total) by mouth 3 (three) times daily.  . pantoprazole (PROTONIX) 40 MG tablet Take 1 tablet (40 mg total) by mouth daily.     Allergies:   Vicodin [hydrocodone-acetaminophen]   Social History   Socioeconomic History  . Marital status: Single    Spouse name: Not on file  . Number of children: 1  . Years of education: Not on file  . Highest education level: 9th grade  Occupational History  . Not on file  Tobacco Use  . Smoking status: Current Every Day Smoker    Packs/day: 0.30    Years: 26.00    Pack years: 7.80    Types: Cigarettes  . Smokeless tobacco: Never Used  . Tobacco comment: quit May 2019  Vaping Use  . Vaping Use: Never used  Substance and Sexual Activity  . Alcohol use: Not Currently    Comment: social drinking  . Drug use: Yes    Types: Cocaine, "Crack" cocaine  . Sexual activity: Not Currently    Birth control/protection: Post-menopausal  Other Topics Concern  . Not on file  Social History Narrative  . Not on file   Social Determinants of Health   Financial Resource Strain:   . Difficulty of Paying Living Expenses: Not on file  Food  Insecurity:   . Worried About Charity fundraiser in the Last Year: Not on file  . Ran Out of Food in the Last Year: Not on file  Transportation Needs:   . Lack of Transportation (Medical): Not on file  . Lack of Transportation (Non-Medical): Not on file  Physical Activity:   . Days of Exercise per Week: Not on file  . Minutes of Exercise per Session: Not on file  Stress:   . Feeling of Stress : Not on file  Social Connections:   . Frequency of Communication with Friends and Family: Not on file  . Frequency of Social Gatherings with Friends and Family: Not on file  . Attends Religious Services: Not on file  . Active Member of Clubs or Organizations: Not on file  . Attends Archivist Meetings: Not on file  . Marital Status: Not on file     Family History: The patient's family history includes Anuerysm in her mother; Congestive Heart Failure in her mother; Hypertension in her brother, father, and sister. There is no history of Colon cancer. ROS:   Please see the history of present illness.    All 14 point review of systems negative except as described per history of present illness  EKGs/Labs/Other Studies Reviewed:      Recent Labs: 07/02/2019: ALT 14 12/20/2019: B Natriuretic Peptide 109.0; Magnesium 2.0; TSH 4.035 12/21/2019: BUN 22; Creatinine, Ser 1.59; Hemoglobin 12.7; Platelets 258; Potassium 3.6; Sodium 139  Recent Lipid Panel    Component Value Date/Time   CHOL 248 (H) 07/02/2019 1135   TRIG 88 07/02/2019 1135   HDL 78 07/02/2019 1135   CHOLHDL 3.2 07/02/2019 1135   CHOLHDL 3.4 Ratio 03/30/2009 2039   VLDL 28 03/30/2009 2039   LDLCALC 155 (H) 07/02/2019 1135    Physical Exam:    VS:  BP (!) 190/120 (BP Location: Left Arm, Patient Position: Sitting, Cuff Size: Large)   Pulse 64   Ht 5\' 4"  (1.626 m)   Wt 241 lb (109.3 kg)   LMP  (LMP Unknown) Comment: vaginal bleeding continuously for last 8 months  SpO2 97%   BMI 41.37 kg/m     Wt Readings from Last  3 Encounters:  02/21/20 241 lb (109.3 kg)  01/24/20 242 lb 9.6 oz (110 kg)  12/21/19 239 lb 6.4 oz (108.6 kg)     GEN:  Well nourished, well developed in no acute distress HEENT: Normal NECK: No JVD; No carotid bruits LYMPHATICS: No lymphadenopathy CARDIAC: RRR, no murmurs, no rubs, no gallops RESPIRATORY:  Clear to auscultation without rales, wheezing or rhonchi  ABDOMEN: Soft, non-tender, non-distended MUSCULOSKELETAL:  No edema; No deformity  SKIN: Warm and dry  LOWER EXTREMITIES: no swelling NEUROLOGIC:  Alert and oriented x 3 PSYCHIATRIC:  Normal affect   ASSESSMENT:    1. Mild renal insufficiency   2. Essential hypertension   3. Stage 3b chronic kidney disease   4. Chest pain, unspecified type   5. Morbid obesity ([place])    PLAN:    In order of problems listed above:  1. Essential hypertension.  Meet criteria for multidrug-resistant hypertension.  I will initiate clonidine 0.1 mg daily ask you to take it at evening time if she have difficulty tolerating this during the day.  We will continue rest of the medication.  And I also told her that she must take her medication on the regular basis.  Also we discussed the issue that she must stop using cocaine. 2. Mild renal insufficiency, Chem-7 will be rechecked today. 3. Chest pain related to cocaine. 4. Compliance is an issue.  I stressed importance of taking all medication on the regular basis.  Today her blood pressure is high, however, he is completely asymptomatic.  I asked her to take her medication the moment she gets home.  Also told her to call 911 if she is still having some symptoms.   Medication Adjustments/Labs and Tests Ordered: Current medicines are reviewed at length with the patient today.  Concerns regarding medicines are outlined above.  Orders Placed This Encounter  Procedures  . Basic metabolic panel  . VAS US RENAL ARTERY DUPLEX   Medication changes:  Meds ordered this encounter  Medications  .  cloNIDine (CATAPRES) 0.1 MG tablet    Sig: Take 1 tablet (0.1 mg total) by mouth daily.    Dispense:  90 tablet    Refill:  1    Signed, Park Liter, MD, [facility] 02/21/2020 11:51 AM    [place]

## 2020-02-22 LAB — BASIC METABOLIC PANEL
BUN/Creatinine Ratio: 13 (ref 9–23)
BUN: 18 mg/dL (ref 6–24)
CO2: 24 mmol/L (ref 20–29)
Calcium: 9.5 mg/dL (ref 8.7–10.2)
Chloride: 107 mmol/L — ABNORMAL HIGH (ref 96–106)
Creatinine, Ser: 1.38 mg/dL — ABNORMAL HIGH (ref 0.57–1.00)
GFR calc Af Amer: 49 mL/min/{1.73_m2} — ABNORMAL LOW (ref >59)
GFR calc non Af Amer: 42 mL/min/{1.73_m2} — ABNORMAL LOW (ref >59)
Glucose: 99 mg/dL (ref 65–99)
Potassium: 4.3 mmol/L (ref 3.5–5.2)
Sodium: 143 mmol/L (ref 134–144)

## 2020-02-25 ENCOUNTER — Other Ambulatory Visit: Payer: Self-pay | Admitting: Family Medicine

## 2020-02-25 DIAGNOSIS — I10 Essential (primary) hypertension: Secondary | ICD-10-CM

## 2020-02-25 NOTE — Telephone Encounter (Signed)
Medication Refill - Medication:  amLODipine (NORVASC) 10 MG tablet  Has the patient contacted their pharmacy? Yes.   (Agent: If no, request that the patient contact the pharmacy for the refill.) (Agent: If yes, when and what did the pharmacy advise?)  Preferred Pharmacy (with phone number or street name):  [facility], [place] [address]  [place] [address]  Phone: [phone] Fax: [phone]     Agent: Please be advised that RX refills may take up to 3 business days. We ask that you follow-up with your pharmacy.

## 2020-02-25 NOTE — Telephone Encounter (Signed)
Requested medication (s) are due for refill today: yes  Requested medication (s) are on the active medication list: yes  Last refill:  02/21/20 #90 1 refill   Future visit scheduled: yes  Notes to clinic:  order is med expired on 02/21/20 . Do you want to reorder Rx?     Requested Prescriptions  Pending Prescriptions Disp Refills   amLODipine (NORVASC) 10 MG tablet 90 tablet 1    Sig: Take 1 tablet (10 mg total) by mouth daily.      Cardiovascular:  Calcium Channel Blockers Failed - 02/25/2020  5:45 PM      Failed - Last BP in normal range    BP Readings from Last 1 Encounters:  02/21/20 (!) 190/120          Passed - Valid encounter within last 6 months    Recent Outpatient Visits           1 month ago Viral upper respiratory tract infection   [place], Charlane Ferretti, MD   7 months ago Screening for colon cancer   [place] Charlott Rakes, MD   1 year ago Essential hypertension   [place], Enobong, MD   1 year ago Endometrial cancer [facility])   [place], Charlane Ferretti, MD   1 year ago Chronic diastolic congestive heart failure, NYHA class 3 ([place])   [place], Enobong, MD       Future Appointments             In 3 weeks Tobb, Godfrey Pick, DO [facility] [place]

## 2020-02-26 ENCOUNTER — Other Ambulatory Visit: Payer: Self-pay | Admitting: Family Medicine

## 2020-02-26 MED ORDER — AMLODIPINE BESYLATE 10 MG PO TABS: 10.0000 mg | ORAL_TABLET | Freq: Every day | ORAL | 1 refills | Status: DC

## 2020-02-27 MED FILL — AMLODIPINE BESYLATE 10 MG T: 10 | 90 days supply | Qty: 90 | Fill #0

## 2020-03-03 ENCOUNTER — Ambulatory Visit (HOSPITAL_COMMUNITY)
Admission: RE | Admit: 2020-03-03 | Discharge: 2020-03-03 | Disposition: A | Discharge status: Home / Self Care | Payer: Medicare Other | Source: Ambulatory Visit | Attending: Cardiology | Admitting: Cardiology

## 2020-03-03 ENCOUNTER — Other Ambulatory Visit: Payer: Self-pay

## 2020-03-03 DIAGNOSIS — N289 Disorder of kidney and ureter, unspecified: Secondary | ICD-10-CM | POA: Insufficient documentation

## 2020-03-03 NOTE — Progress Notes (Signed)
Renal artery duplex has been completed.   Preliminary results in CV Proc.   Abram Sander 03/03/2020 9:28 AM

## 2020-03-23 ENCOUNTER — Encounter: Payer: Self-pay | Admitting: Cardiology

## 2020-03-23 ENCOUNTER — Ambulatory Visit (INDEPENDENT_AMBULATORY_CARE_PROVIDER_SITE_OTHER): Payer: Medicare Other | Admitting: Cardiology

## 2020-03-23 ENCOUNTER — Other Ambulatory Visit: Payer: Self-pay

## 2020-03-23 VITALS — BP 160/98 | HR 68 | Ht 64.0 in | Wt 239.4 lb

## 2020-03-23 DIAGNOSIS — I10 Essential (primary) hypertension: Secondary | ICD-10-CM | POA: Diagnosis not present

## 2020-03-23 DIAGNOSIS — I5032 Chronic diastolic (congestive) heart failure: Secondary | ICD-10-CM

## 2020-03-23 MED FILL — AMLODIPINE BESYLATE 10 MG T: 10 | 90 days supply | Qty: 90 | Fill #0

## 2020-03-23 MED FILL — cloNIDine HCL 0.1 MG TABS: 0.1 | 90 days supply | Qty: 90 | Fill #0

## 2020-03-23 NOTE — Patient Instructions (Signed)
Medication Instructions:  No medication changes. *If you need a refill on your cardiac medications before your next appointment, please call your pharmacy*   Lab Work: None ordered If you have labs (blood work) drawn today and your tests are completely normal, you will receive your results only by: Marland Kitchen MyChart Message (if you have MyChart) OR . A paper copy in the mail If you have any lab test that is abnormal or we need to change your treatment, we will call you to review the results.   Testing/Procedures: None ordered   Follow-Up: At [facility], you and your health needs are our priority.  As part of our continuing mission to provide you with exceptional heart care, we have created designated Provider Care Teams.  These Care Teams include your primary Cardiologist (physician) and Advanced Practice Providers (APPs -  Physician Assistants and Nurse Practitioners) who all work together to provide you with the care you need, when you need it.  We recommend signing up for the patient portal called "MyChart".  Sign up information is provided on this After Visit Summary.  MyChart is used to connect with patients for Virtual Visits (Telemedicine).  Patients are able to view lab/test results, encounter notes, upcoming appointments, etc.  Non-urgent messages can be sent to your provider as well.   To learn more about what you can do with MyChart, go to [email or web address].    Your next appointment:   1 month(s)  The format for your next appointment:   In Person  Provider:   Jenne Campus, MD   Other Instructions NA

## 2020-03-23 NOTE — Progress Notes (Signed)
Cardiology Office Note:    Date:  03/23/2020   ID:  Angela Gardner, DOB 1961-02-10, MRN [id number]  PCP:  Charlott Rakes, MD  Cardiologist:  Jenne Campus, MD  Electrophysiologist:  None   Referring MD: Charlott Rakes, MD     History of Present Illness:    Angela Gardner is a 59 y.o. female with a hx of chest pain which has been related to cocaine, multi resistant hypertension presents today for follow-up as set up by her primary cardiologist.  During my encounter with the patient the patient was very belligerent and did not want to answer any of my questions at this time.  She was very argumentative when I tried to talk to her about her medication regimen.  With this I have to get a chaperone to complete the visit.  I offered the patient for her to not be seen by me but did not answer therefore requested a chaperone.  Because given her belligerent I did not want to touch the patient without another person present.  Past Medical History:  Diagnosis Date  . Anemia   . 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 CHF (congestive heart failure) ([place])   . Dyspnea    still having this and not moving around alot -gets worse with exertion  . History of sleep walking   . Hx of left breast biopsy   . Hyperlipemia   . Hypertension    not on any medication now-[facility] had  prescribed her a med and she stopped taking them cannot remember when  . Renal disorder     Past Surgical History:  Procedure Laterality Date  . BREAST BIOPSY     left breast  . CESAREAN SECTION     one  . CHOLECYSTECTOMY    . DILATION AND CURETTAGE OF UTERUS     approx 1 year ago  . IR IMAGING GUIDED PORT INSERTION  12/06/2017  . IR REMOVAL TUN ACCESS W/ PORT W/O FL MOD SED  06/19/2018  . LYMPH NODE BIOPSY N/A 11/14/2017   Procedure: SENTINEL LYMPH NODE BIOPSY;  Surgeon: Everitt Amber, MD;  [facility];  Service: Gynecology;  Laterality: N/A;  . ROBOTIC ASSISTED TOTAL HYSTERECTOMY WITH BILATERAL  SALPINGO OOPHERECTOMY N/A 11/14/2017   Procedure: XI ROBOTIC ASSISTED TOTAL HYSTERECTOMY WITH BILATERAL SALPINGO OOPHORECTOMY, RIGHT PELVIC LYMPHADENECTOMY;  Surgeon: Everitt Amber, MD;  Location: [facility];  Service: Gynecology;  Laterality: N/A;    Current Medications: Current Meds  Medication Sig  . acetaminophen (TYLENOL) 500 MG tablet Take 1,000 mg by mouth every 6 (six) hours as needed for moderate pain.   Marland Kitchen amLODipine (NORVASC) 10 MG tablet Take 1 tablet (10 mg total) by mouth daily.  Marland Kitchen atorvastatin (LIPITOR) 40 MG tablet Take 0.5 tablets (20 mg total) by mouth daily.  . cloNIDine (CATAPRES) 0.1 MG tablet Take 1 tablet (0.1 mg total) by mouth daily.  . furosemide (LASIX) 40 MG tablet Take 1 tablet (40 mg total) by mouth 2 (two) times daily.  . hydrALAZINE (APRESOLINE) 50 MG tablet Take 1.5 tablets (75 mg total) by mouth 3 (three) times daily.  . pantoprazole (PROTONIX) 40 MG tablet Take 1 tablet (40 mg total) by mouth daily.     Allergies:   Vicodin [hydrocodone-acetaminophen]   Social History   Socioeconomic History  . Marital status: Single    Spouse name: Not on file  . Number of children: 1  . Years of education: Not on file  . Highest education  level: 9th grade  Occupational History  . Not on file  Tobacco Use  . Smoking status: Former Smoker    Packs/day: 0.30    Years: 26.00    Pack years: 7.80    Types: Cigarettes  . Smokeless tobacco: Never Used  . Tobacco comment: quit May 2019  Vaping Use  . Vaping Use: Never used  Substance and Sexual Activity  . Alcohol use: Not Currently    Comment: social drinking  . Drug use: Yes    Types: Cocaine, "Crack" cocaine  . Sexual activity: Not Currently    Birth control/protection: Post-menopausal  Other Topics Concern  . Not on file  Social History Narrative  . Not on file   Social Determinants of Health   Financial Resource Strain:   . Difficulty of Paying Living Expenses: Not on file  Food Insecurity:   . Worried  About Charity fundraiser in the Last Year: Not on file  . Ran Out of Food in the Last Year: Not on file  Transportation Needs:   . Lack of Transportation (Medical): Not on file  . Lack of Transportation (Non-Medical): Not on file  Physical Activity:   . Days of Exercise per Week: Not on file  . Minutes of Exercise per Session: Not on file  Stress:   . Feeling of Stress : Not on file  Social Connections:   . Frequency of Communication with Friends and Family: Not on file  . Frequency of Social Gatherings with Friends and Family: Not on file  . Attends Religious Services: Not on file  . Active Member of Clubs or Organizations: Not on file  . Attends Archivist Meetings: Not on file  . Marital Status: Not on file     Family History: The patient's family history includes Anuerysm in her mother; Congestive Heart Failure in her mother; Hypertension in her brother, father, and sister. There is no history of Colon cancer.  ROS:   Review of Systems  Constitution: Negative for decreased appetite, fever and weight gain.  HENT: Negative for congestion, ear discharge, hoarse voice and sore throat.   Eyes: Negative for discharge, redness, vision loss in right eye and visual halos.  Cardiovascular: Negative for chest pain, dyspnea on exertion, leg swelling, orthopnea and palpitations.  Respiratory: Negative for cough, hemoptysis, shortness of breath and snoring.   Endocrine: Negative for heat intolerance and polyphagia.  Hematologic/Lymphatic: Negative for bleeding problem. Does not bruise/bleed easily.  Skin: Negative for flushing, nail changes, rash and suspicious lesions.  Musculoskeletal: Negative for arthritis, joint pain, muscle cramps, myalgias, neck pain and stiffness.  Gastrointestinal: Negative for abdominal pain, bowel incontinence, diarrhea and excessive appetite.  Genitourinary: Negative for decreased libido, genital sores and incomplete emptying.  Neurological: Negative  for brief paralysis, focal weakness, headaches and loss of balance.  Psychiatric/Behavioral: Negative for altered mental status, depression and suicidal ideas.  Allergic/Immunologic: Negative for HIV exposure and persistent infections.    EKGs/Labs/Other Studies Reviewed:    The following studies were reviewed today:   EKG: None today  Recent Labs: 07/02/2019: ALT 14 12/20/2019: B Natriuretic Peptide 109.0; Magnesium 2.0; TSH 4.035 12/21/2019: Hemoglobin 12.7; Platelets 258 02/21/2020: BUN 18; Creatinine, Ser 1.38; Potassium 4.3; Sodium 143  Recent Lipid Panel    Component Value Date/Time   CHOL 248 (H) 07/02/2019 1135   TRIG 88 07/02/2019 1135   HDL 78 07/02/2019 1135   CHOLHDL 3.2 07/02/2019 1135   CHOLHDL 3.4 Ratio 03/30/2009 2039   VLDL  28 03/30/2009 2039   LDLCALC 155 (H) 07/02/2019 1135    Physical Exam:    VS:  BP (!) 160/98 (BP Location: Right Arm, Patient Position: Sitting, Cuff Size: Normal)   Pulse 68   Ht 5\' 4"  (1.626 m)   Wt 239 lb 6.4 oz (108.6 kg)   LMP  (LMP Unknown) Comment: vaginal bleeding continuously for last 8 months  SpO2 97%   BMI 41.09 kg/m     Wt Readings from Last 3 Encounters:  03/23/20 239 lb 6.4 oz (108.6 kg)  02/21/20 241 lb (109.3 kg)  01/24/20 242 lb 9.6 oz (110 kg)     GEN: Well nourished, well developed in no acute distress HEENT: Normal NECK: No JVD; No carotid bruits LYMPHATICS: No lymphadenopathy CARDIAC: S1S2 noted,RRR, no murmurs, rubs, gallops RESPIRATORY:  Clear to auscultation without rales, wheezing or rhonchi  ABDOMEN: Soft, non-tender, non-distended, +bowel sounds, no guarding. EXTREMITIES: No edema, No cyanosis, no clubbing MUSCULOSKELETAL:  No deformity  SKIN: Warm and dry NEUROLOGIC:  Alert and oriented x 3, non-focal PSYCHIATRIC:  Normal affect, good insight  ASSESSMENT:    No diagnosis found. PLAN:     Unfortunately she has not taken her medications or picked up the prescription that was called out to her  clonidine 0.1 mg.  I cannot make any changes to her medication regimen due to the fact that she is significantly noncompliant with her regimen.  She also does not appear to be interested in any discussion about advice on detrimental effects on cardiovascular health as it relates to significant high blood pressure.  The patient is in agreement with the above plan. The patient left the office in stable condition.  The patient will follow up Dr. Agustin Cree only please   Medication Adjustments/Labs and Tests Ordered: Current medicines are reviewed at length with the patient today.  Concerns regarding medicines are outlined above.  No orders of the defined types were placed in this encounter.  No orders of the defined types were placed in this encounter.   There are no Patient Instructions on file for this visit.   Adopting a Healthy Lifestyle.  Know what a healthy weight is for you (roughly BMI <25) and aim to maintain this   Aim for 7+ servings of fruits and vegetables daily   65-80+ fluid ounces of water or unsweet tea for healthy kidneys   Limit to max 1 drink of alcohol per day; avoid smoking/tobacco   Limit animal fats in diet for cholesterol and heart health - choose grass fed whenever available   Avoid highly processed foods, and foods high in saturated/trans fats   Aim for low stress - take time to unwind and care for your mental health   Aim for 150 min of moderate intensity exercise weekly for heart health, and weights twice weekly for bone health   Aim for 7-9 hours of sleep daily   When it comes to diets, agreement about the perfect plan isnt easy to find, even among the experts. Experts at the [employer] developed an idea known as the Healthy Eating Plate. Just imagine a plate divided into logical, healthy portions.   The emphasis is on diet quality:   Load up on vegetables and fruits - one-half of your plate: Aim for color and variety, and  remember that potatoes dont count.   Go for whole grains - one-quarter of your plate: Whole wheat, barley, wheat berries, quinoa, oats, brown rice, and foods made with  them. If you want pasta, go with whole wheat pasta.   Protein power - one-quarter of your plate: Fish, chicken, beans, and nuts are all healthy, versatile protein sources. Limit red meat.   The diet, however, does go beyond the plate, offering a few other suggestions.   Use healthy plant oils, such as olive, canola, soy, corn, sunflower and peanut. Check the labels, and avoid partially hydrogenated oil, which have unhealthy trans fats.   If youre thirsty, drink water. Coffee and tea are good in moderation, but skip sugary drinks and limit milk and dairy products to one or two daily servings.   The type of carbohydrate in the diet is more important than the amount. Some sources of carbohydrates, such as vegetables, fruits, whole grains, and beans-are healthier than others.   Finally, stay active  Signed, Berniece Salines, DO  03/23/2020 11:11 AM    [place]

## 2020-03-25 NOTE — Progress Notes (Signed)
Radiation Oncology         [phone] ________________________________  Name: Angela Gardner MRN: [id number]  Date: 03/26/2020  DOB: Oct 17, 1960  Follow-Up Visit Note  CC: Charlott Rakes, MD  Everitt Amber, MD    ICD-10-CM   1. 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 Since her last visit, she was seen in the ED on 12/19/2019 with chief complaint of chest pain. Chest x-ray was unremarkable and chest pain was considered atypical, however she did have low level abnormal troponins in the 20's. Thus, she was admitted to the hospital for further management. Echocardiogram was performed the following day and showed an EF of 60%. The patient's chest pain was found to be secondary to cocaine use. She was stabilized and discharged home on 12/21/2019.  The patient was last seen by Dr. Denman George on 01/24/2020, during which time there was no evidence of recurrence on examination.  On review of systems, she reports no pelvic pain, abdominal bloating, vaginal bleeding, urination difficulties or rectal discomfort. She plans on taking her new blood pressure blood pressure medication when she returns home this morning.   ALLERGIES:  is allergic to vicodin [hydrocodone-acetaminophen].  Meds: Current Outpatient Medications  Medication Sig Dispense Refill  . acetaminophen (TYLENOL) 500 MG tablet Take 1,000 mg by mouth every 6 (six) hours as needed for moderate pain.     Marland Kitchen amLODipine (NORVASC) 10 MG tablet Take 1 tablet (10 mg total) by mouth daily. 90 tablet 1  . atorvastatin (LIPITOR) 40 MG tablet Take 0.5 tablets (20 mg total) by mouth daily. 30 tablet 5  . cloNIDine (CATAPRES) 0.1 MG tablet Take 1 tablet (0.1 mg  total) by mouth daily. 90 tablet 1  . furosemide (LASIX) 40 MG tablet Take 1 tablet (40 mg total) by mouth 2 (two) times daily. 180 tablet 1  . hydrALAZINE (APRESOLINE) 50 MG tablet Take 1.5 tablets (75 mg total) by mouth 3 (three) times daily. 270 tablet 1  . pantoprazole (PROTONIX) 40 MG tablet Take 1 tablet (40 mg total) by mouth daily. 60 tablet 0   No current facility-administered medications for this encounter.    Physical Findings: The patient is in no acute distress. Patient is alert and oriented.  height is 5\' 4"  (1.626 m) and weight is 241 lb 12.8 oz (109.7 kg). Her blood pressure is 184/117 (abnormal) and her pulse is 62. Her respiration is 24 (abnormal) and oxygen saturation is 100%.   Lungs are clear to auscultation bilaterally. Heart has regular rate and rhythm. No palpable cervical, supraclavicular, or axillary adenopathy. Abdomen soft, non-tender, normal bowel sounds. On pelvic examination the external genitalia were unremarkable. A speculum exam was performed. There are no mucosal lesions noted in the vaginal vault. On bimanual  examination there were no pelvic masses appreciated.  Vaginal cuff intact.  Lab Findings: Lab Results  Component Value Date   WBC 4.8 12/21/2019   HGB 12.7 12/21/2019   HCT 41.0 12/21/2019   MCV 92.6 12/21/2019   PLT 258 12/21/2019    Radiographic Findings: VAS US RENAL ARTERY DUPLEX  Result Date: 03/03/2020 ABDOMINAL VISCERAL Indications: Hypertension High Risk Factors: Hypertension, hyperlipidemia. Comparison Study: no prior Performing Technologist: Abram Sander RVS  Examination Guidelines: A complete evaluation includes B-mode imaging, spectral Doppler, color Doppler, and power Doppler as needed of all accessible portions of each vessel. Bilateral testing is considered an integral part of a complete examination. Limited examinations for reoccurring indications may be performed as noted.  Duplex Findings:  +------------+--------+--------+------+--------+ Mesenteric  PSV cm/sEDV cm/sPlaqueComments +------------+--------+--------+------+--------+ Aorta Prox    109                          +------------+--------+--------+------+--------+ SMA Proximal  134                          +------------+--------+--------+------+--------+    +------------------+--------+--------+-------+ Right Renal ArteryPSV cm/sEDV cm/sComment +------------------+--------+--------+-------+ Origin               58      14           +------------------+--------+--------+-------+ Proximal             86      20           +------------------+--------+--------+-------+ Mid                  77      25           +------------------+--------+--------+-------+ Distal               42      13           +------------------+--------+--------+-------+ +-----------------+--------+--------+-------+ Left Renal ArteryPSV cm/sEDV cm/sComment +-----------------+--------+--------+-------+ Origin              32      10           +-----------------+--------+--------+-------+ Proximal            38      11           +-----------------+--------+--------+-------+ Mid                 32      11           +-----------------+--------+--------+-------+ Distal              34      10           +-----------------+--------+--------+-------+ +------------+--------+--------+----+-----------+--------+--------+----+ Right KidneyPSV cm/sEDV cm/sRI  Left KidneyPSV cm/sEDV cm/sRI   +------------+--------+--------+----+-----------+--------+--------+----+ Upper Pole  17      8       0.53Upper Pole 27      8       0.70 +------------+--------+--------+----+-----------+--------+--------+----+ Mid         59      11      0.81Mid        31      12      0.62 +------------+--------+--------+----+-----------+--------+--------+----+ Lower Pole  29      10      0.65Lower Pole 21      5        0.76 +------------+--------+--------+----+-----------+--------+--------+----+ Hilar       46      18      0.62Hilar      45      12      0.72 +------------+--------+--------+----+-----------+--------+--------+----+ +------------------+-----+------------------+----+ Right Kidney           Left Kidney            +------------------+-----+------------------+----+ RAR  RAR                    +------------------+-----+------------------+----+ RAR (manual)           RAR (manual)           +------------------+-----+------------------+----+ Cortex                 Cortex                 +------------------+-----+------------------+----+ Cortex thickness       Corex thickness        +------------------+-----+------------------+----+ Kidney length (cm)10.50Kidney length (cm)9.47 +------------------+-----+------------------+----+  Summary: Renal:  Right: Abnormal right Resistive Index. 1-59% stenosis of the right        renal artery. Normal size right kidney. Left:  Abnormal left Resisitve Index. 1-59% stenosis of the left        renal artery. Normal size of left kidney.  *See table(s) above for measurements and observations.  Diagnosing physician: Curt Jews MD  Electronically signed by Curt Jews MD on 03/03/2020 at 5:03:53 PM.    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   No evidence of recurrence on clinical exam today.  Plan:  The patient will follow-up with Dr. Denman George in six months and with radiation oncology in one year.  Total time spent in this encounter was 25 minutes which included reviewing the patient's most recent follow-up with Dr. Denman George, ED visit, hospitalization, echocardiogram, physical examination, and documentation.  ____________________________________  Blair Promise, PhD, MD  This document serves as a record of services personally performed by Gery Pray, MD. It was  created on his behalf by Clerance Lav, a trained medical scribe. The creation of this record is based on the scribe's personal observations and the provider's statements to them. This document has been checked and approved by the attending provider.

## 2020-03-26 ENCOUNTER — Ambulatory Visit
Admission: RE | Admit: 2020-03-26 | Discharge: 2020-03-26 | Disposition: A | Discharge status: Home / Self Care | Payer: Medicare Other | Source: Ambulatory Visit | Attending: Radiation Oncology | Admitting: Radiation Oncology

## 2020-03-26 ENCOUNTER — Other Ambulatory Visit: Payer: Self-pay

## 2020-03-26 ENCOUNTER — Encounter: Payer: Self-pay | Admitting: Radiation Oncology

## 2020-03-26 VITALS — BP 184/117 | HR 62 | Resp 24 | Ht 64.0 in | Wt 241.8 lb

## 2020-03-26 DIAGNOSIS — C541 Malignant neoplasm of endometrium: Secondary | ICD-10-CM

## 2020-03-26 DIAGNOSIS — Z79899 Other long term (current) drug therapy: Secondary | ICD-10-CM | POA: Insufficient documentation

## 2020-03-26 DIAGNOSIS — Z08 Encounter for follow-up examination after completed treatment for malignant neoplasm: Secondary | ICD-10-CM | POA: Diagnosis not present

## 2020-03-26 DIAGNOSIS — Z923 Personal history of irradiation: Secondary | ICD-10-CM | POA: Diagnosis not present

## 2020-03-26 DIAGNOSIS — Z8542 Personal history of malignant neoplasm of other parts of uterus: Secondary | ICD-10-CM | POA: Insufficient documentation

## 2020-03-26 NOTE — Patient Instructions (Signed)
Coronavirus (COVID-19) Are you at risk?  Are you at risk for the Coronavirus (COVID-19)?  To be considered HIGH RISK for Coronavirus (COVID-19), you have to meet the following criteria:  . Traveled to [place], [place], [place], [place] or [place]; or in the [place] to [place], [place], [place], or [place]; and have fever, cough, and shortness of breath within the last 2 weeks of travel OR . Been in close contact with a person diagnosed with COVID-19 within the last 2 weeks and have fever, cough, and shortness of breath . IF YOU DO NOT MEET THESE CRITERIA, YOU ARE CONSIDERED LOW RISK FOR COVID-19.  What to do if you are HIGH RISK for COVID-19?  . If you are having a medical emergency, call 911. . Seek medical care right away. Before you go to a doctor's office, urgent care or emergency department, call ahead and tell them about your recent travel, contact with someone diagnosed with COVID-19, and your symptoms. You should receive instructions from your physician's office regarding next steps of care.  . When you arrive at healthcare provider, tell the healthcare staff immediately you have returned from visiting [place], [place], [place], [place] or [place]; or traveled in the [place] to [place], [place], [place], or [place]; in the last two weeks or you have been in close contact with a person diagnosed with COVID-19 in the last 2 weeks.   . Tell the health care staff about your symptoms: fever, cough and shortness of breath. . After you have been seen by a medical provider, you will be either: o Tested for (COVID-19) and discharged home on quarantine except to seek medical care if symptoms worsen, and asked to  - Stay home and avoid contact with others until you get your results (4-5 days)  - Avoid travel on public transportation if possible (such as bus, train, or airplane) or o Sent to the Emergency Department by EMS for evaluation, COVID-19 testing, and possible  admission depending on your condition and test results.  What to do if you are LOW RISK for COVID-19?  Reduce your risk of any infection by using the same precautions used for avoiding the common cold or flu:  . Wash your hands often with soap and warm water for at least 20 seconds.  If soap and water are not readily available, use an alcohol-based hand sanitizer with at least 60% alcohol.  . If coughing or sneezing, cover your mouth and nose by coughing or sneezing into the elbow areas of your shirt or coat, into a tissue or into your sleeve (not your hands). . Avoid shaking hands with others and consider head nods or verbal greetings only. . Avoid touching your eyes, nose, or mouth with unwashed hands.  . Avoid close contact with people who are sick. . Avoid places or events with large numbers of people in one location, like concerts or sporting events. . Carefully consider travel plans you have or are making. . If you are planning any travel outside or inside the [place], visit the CDC's Travelers' Health webpage for the latest health notices. . If you have some symptoms but not all symptoms, continue to monitor at home and seek medical attention if your symptoms worsen. . If you are having a medical emergency, call 911.   ADDITIONAL HEALTHCARE OPTIONS FOR PATIENTS  [facility] / e-Visit: [email or web address]         [facility]: [phone]  [facility]: [phone]                   [facility]: [phone]   

## 2020-03-26 NOTE — Progress Notes (Signed)
Patient is two plus years since radiation. Patient has very high blood pressure states sometimes it is high at the doctors. She was seen by her cardiologist recently and given a new prescription for blood pressure medication that she just got filled. Patient states she takes her blood pressure at home advised her to recheck it this afternoon and she has not taken any of her medication this morning. No other complaints of.

## 2020-05-05 DIAGNOSIS — Z9889 Other specified postprocedural states: Secondary | ICD-10-CM | POA: Insufficient documentation

## 2020-05-05 DIAGNOSIS — C801 Malignant (primary) neoplasm, unspecified: Secondary | ICD-10-CM | POA: Insufficient documentation

## 2020-05-05 DIAGNOSIS — E785 Hyperlipidemia, unspecified: Secondary | ICD-10-CM | POA: Insufficient documentation

## 2020-05-05 DIAGNOSIS — I509 Heart failure, unspecified: Secondary | ICD-10-CM | POA: Insufficient documentation

## 2020-05-05 DIAGNOSIS — I10 Essential (primary) hypertension: Secondary | ICD-10-CM | POA: Insufficient documentation

## 2020-05-05 DIAGNOSIS — Z8659 Personal history of other mental and behavioral disorders: Secondary | ICD-10-CM | POA: Insufficient documentation

## 2020-05-05 DIAGNOSIS — N289 Disorder of kidney and ureter, unspecified: Secondary | ICD-10-CM | POA: Insufficient documentation

## 2020-05-05 DIAGNOSIS — D649 Anemia, unspecified: Secondary | ICD-10-CM | POA: Insufficient documentation

## 2020-05-05 DIAGNOSIS — R0600 Dyspnea, unspecified: Secondary | ICD-10-CM | POA: Insufficient documentation

## 2020-05-06 ENCOUNTER — Ambulatory Visit: Payer: Medicare Other | Admitting: Cardiology

## 2020-05-15 ENCOUNTER — Encounter (HOSPITAL_COMMUNITY): Payer: Self-pay

## 2020-05-15 ENCOUNTER — Ambulatory Visit (HOSPITAL_COMMUNITY)
Admission: EM | Admit: 2020-05-15 | Discharge: 2020-05-15 | Disposition: A | Discharge status: Home / Self Care | Payer: Medicare Other | Attending: Family Medicine | Admitting: Family Medicine

## 2020-05-15 ENCOUNTER — Other Ambulatory Visit: Payer: Self-pay

## 2020-05-15 DIAGNOSIS — L03011 Cellulitis of right finger: Secondary | ICD-10-CM

## 2020-05-15 MED ORDER — MUPIROCIN 2 % EX OINT: 1.0000 "application " | TOPICAL_OINTMENT | Freq: Two times a day (BID) | CUTANEOUS | 0 refills | Status: DC

## 2020-05-15 MED ORDER — TRAMADOL HCL 50 MG PO TABS: 50.0000 mg | ORAL_TABLET | Freq: Four times a day (QID) | ORAL | 0 refills | Status: DC | PRN

## 2020-05-15 MED ORDER — CEPHALEXIN 500 MG PO CAPS: 500.0000 mg | ORAL_CAPSULE | Freq: Four times a day (QID) | ORAL | 0 refills | Status: AC

## 2020-05-15 NOTE — ED Provider Notes (Signed)
[facility]    CSN: [id number] Arrival date & time: 05/15/20  1003      History   Chief Complaint Chief Complaint  Patient presents with  . Hand Pain    right thumb since tuesday    HPI Angela Gardner is a 59 y.o. female history of CHF, CKD stage III, hypertension, presenting today for thumb pain.  Reports worsening pain and swelling in her right thumb over the past 2 to 3 days.  Denies injury.  Has had similar needed drainage.  Reports pulling a hangnail recently.  HPI  Past Medical History:  Diagnosis Date  . Acute bronchitis 12/04/2008   Qualifier: Diagnosis of  By: Tomma Lightning MD, Claiborne Billings    . Acute CHF (congestive heart failure) ([place]) 09/12/2017  . Acute exacerbation of CHF (congestive heart failure) ([place]) 09/13/2017  . Anemia   . 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 Chest discomfort 09/12/2017   Negative stress test in May 2019  . Chest pain 12/20/2019  . CHF (congestive heart failure) ([place])   . Chronic kidney disease (CKD), stage III (moderate) 10/30/2017  . 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 Depression 04/02/2018  . Diastolic congestive heart failure, NYHA class 3 ([place]) 10/27/2017  . Diffuse pain 01/04/2018  . Dysfunctional uterine bleeding 09/12/2017  . Dyspnea    still having this and not moving around alot -gets worse with exertion  . Endometrial cancer ([place]) 10/19/2017  . Essential hypertension 10/27/2017  . History of sleep walking   . Hx of left breast biopsy   . Hyperlipemia   . Hypertension    not on any medication now-[facility] had  prescribed her a med and she stopped taking them cannot remember when  . Hypertensive crisis 09/12/2017  . Illicit drug use 09/26/8117  . 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 Left knee pain   . Mild renal insufficiency 09/12/2017  . Morbid obesity ([place]) 10/09/2017  . Other constipation 11/28/2017  . OTITIS MEDIA 12/04/2008   Qualifier: Diagnosis of  By:  Tomma Lightning MD, Claiborne Billings    . 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 PMB (postmenopausal bleeding) 06/17/2015  . Prediabetes 04/02/2018  . Preventative health care 11/07/2018  . Renal disorder   . 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 Vitamin D deficiency 02/15/2018    Patient Active Problem List   Diagnosis Date Noted  . Anemia   . Cancer ([place])   . CHF (congestive heart failure) ([place])   . Dyspnea   . History of sleep walking   . Hx of left breast biopsy   . Hyperlipemia   . Hypertension   . Renal disorder   . Chest pain 12/20/2019  . Left knee pain   . Preventative health care 11/07/2018  . 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 Depression 04/02/2018  . Prediabetes 04/02/2018  . Vitamin D deficiency 02/15/2018  . Diffuse pain 01/04/2018  . 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 Other constipation 11/28/2017  . Illicit drug use 14/78/2956  . Chronic kidney disease (CKD), stage III (moderate) 10/30/2017  . Essential hypertension 10/27/2017  . Diastolic congestive heart failure, NYHA class 3 ([place]) 10/27/2017  . Endometrial cancer ( [place]) 10/19/2017  . Morbid obesity ([place]) 10/09/2017  . Acute exacerbation of CHF (congestive heart failure) ([place]) 09/13/2017  .  Hypertensive crisis 09/12/2017  . Mild renal insufficiency 09/12/2017  . Dysfunctional uterine bleeding 09/12/2017  . Acute CHF (congestive heart failure) ([place]) 09/12/2017  . Chest discomfort 09/12/2017  . PMB (postmenopausal bleeding) 06/17/2015  . 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 OTITIS MEDIA 12/04/2008  . ACUTE BRONCHITIS 12/04/2008    Past Surgical History:  Procedure Laterality Date  . BREAST BIOPSY     left breast  . CESAREAN SECTION     one  . CHOLECYSTECTOMY    . DILATION AND CURETTAGE OF UTERUS     approx 1  year ago  . IR IMAGING GUIDED PORT INSERTION  12/06/2017  . IR REMOVAL TUN ACCESS W/ PORT W/O FL MOD SED  06/19/2018  . LYMPH NODE BIOPSY N/A 11/14/2017   Procedure: SENTINEL LYMPH NODE BIOPSY;  Surgeon: Everitt Amber, MD;  Location: [facility];  Service: Gynecology;  Laterality: N/A;  . ROBOTIC ASSISTED TOTAL HYSTERECTOMY WITH BILATERAL SALPINGO OOPHERECTOMY N/A 11/14/2017   Procedure: XI ROBOTIC ASSISTED TOTAL HYSTERECTOMY WITH BILATERAL SALPINGO OOPHORECTOMY, RIGHT PELVIC LYMPHADENECTOMY;  Surgeon: Everitt Amber, MD;  Location: [facility];  Service: Gynecology;  Laterality: N/A;    OB History    Gravida  3   Para  1   Term  1   Preterm      AB  2   Living  1     SAB      TAB  2   Ectopic      Multiple      Live Births               Home Medications    Prior to Admission medications   Medication Sig Start Date End Date Taking? Authorizing Provider  acetaminophen (TYLENOL) 500 MG tablet Take 1,000 mg by mouth every 6 (six) hours as needed for moderate pain.    Yes [provider]  amLODipine (NORVASC) 10 MG tablet Take 1 tablet (10 mg total) by mouth daily. 02/26/20 05/26/20 Yes Charlott Rakes, MD  atorvastatin (LIPITOR) 40 MG tablet Take 0.5 tablets (20 mg total) by mouth daily. 07/03/19  Yes Charlott Rakes, MD  cloNIDine (CATAPRES) 0.1 MG tablet Take 1 tablet (0.1 mg total) by mouth daily. 02/21/20  Yes Park Liter, MD  furosemide (LASIX) 40 MG tablet Take 1 tablet (40 mg total) by mouth 2 (two) times daily. 07/02/19  Yes Charlott Rakes, MD  hydrALAZINE (APRESOLINE) 50 MG tablet Take 1.5 tablets (75 mg total) by mouth 3 (three) times daily. 12/21/19  Yes Andrew Au, MD  cephALEXin (KEFLEX) 500 MG capsule Take 1 capsule (500 mg total) by mouth 4 (four) times daily for 5 days. 05/15/20 05/20/20  Ruthell Feigenbaum C, PA-C  mupirocin ointment (BACTROBAN) 2 % Apply 1 application topically 2 (two) times daily. 05/15/20   Leonard Hendler C, PA-C  pantoprazole (PROTONIX) 40  MG tablet Take 1 tablet (40 mg total) by mouth daily. 12/22/19 03/23/20  Andrew Au, MD  traMADol (ULTRAM) 50 MG tablet Take 1 tablet (50 mg total) by mouth every 6 (six) hours as needed for severe pain. 05/15/20   Cesily Cuoco C, PA-C  prochlorperazine (COMPAZINE) 10 MG tablet Take 1 tablet (10 mg total) by mouth every 6 (six) hours as needed (Nausea or vomiting). Patient not taking: Reported on 05/25/2018 12/08/17 05/29/18  Heath Lark, MD    Family History Family History  Problem Relation Age of Onset  . Anuerysm Mother   . Congestive Heart Failure Mother   .  Hypertension Father   . Hypertension Sister   . Hypertension Brother   . Colon cancer Neg Hx     Social History Social History   Tobacco Use  . Smoking status: Former Smoker    Packs/day: 0.30    Years: 26.00    Pack years: 7.80    Types: Cigarettes  . Smokeless tobacco: Never Used  . Tobacco comment: quit May 2019  Vaping Use  . Vaping Use: Never used  Substance Use Topics  . Alcohol use: Not Currently    Comment: social drinking  . Drug use: Yes    Types: Cocaine, "Crack" cocaine     Allergies   Vicodin [hydrocodone-acetaminophen]   Review of Systems Review of Systems  Constitutional: Negative for fatigue and fever.  Eyes: Negative for visual disturbance.  Respiratory: Negative for shortness of breath.   Cardiovascular: Negative for chest pain.  Gastrointestinal: Negative for abdominal pain, nausea and vomiting.  Musculoskeletal: Positive for arthralgias and joint swelling.  Skin: Negative for color change, rash and wound.  Neurological: Negative for dizziness, weakness, light-headedness and headaches.     Physical Exam Triage Vital Signs ED Triage Vitals  Enc Vitals Group     BP 05/15/20 1144 (!) 218/108     Pulse Rate 05/15/20 1144 80     Resp 05/15/20 1144 18     Temp 05/15/20 1144 98.2 F (36.8 C)     Temp Source 05/15/20 1144 Oral     SpO2 05/15/20 1144 99 %     Weight --      Height  --      Head Circumference --      Peak Flow --      Pain Score 05/15/20 1149 10     Pain Loc --      Pain Edu? --      Excl. in [place]? --    No data found.  Updated Vital Signs BP (!) 218/108 (BP Location: Right Arm)   Pulse 80   Temp 98.2 F (36.8 C) (Oral)   Resp 18   LMP  (LMP Unknown) Comment: vaginal bleeding continuously for last 8 months  SpO2 99%   Visual Acuity Right Eye Distance:   Left Eye Distance:   Bilateral Distance:    Right Eye Near:   Left Eye Near:    Bilateral Near:     Physical Exam Vitals and nursing note reviewed.  Constitutional:      Appearance: She is well-developed.     Comments: No acute distress  HENT:     Head: Normocephalic and atraumatic.     Nose: Nose normal.  Eyes:     Conjunctiva/sclera: Conjunctivae normal.  Cardiovascular:     Rate and Rhythm: Normal rate.  Pulmonary:     Effort: Pulmonary effort is normal. No respiratory distress.  Abdominal:     General: There is no distension.  Musculoskeletal:        General: Normal range of motion.     Cervical back: Neck supple.  Skin:    General: Skin is warm and dry.     Comments: FollowingErythema and pus noted along lateral nail fold right thumb  Neurological:     Mental Status: She is alert and oriented to person, place, and time.      UC Treatments / Results  Labs (all labs ordered are listed, but only abnormal results are displayed) Labs Reviewed - No data to display  EKG   Radiology No results found.  Procedures Incision and Drainage  Date/Time: 05/15/2020 12:32 PM Performed by: Carrah Eppolito, Elesa Hacker, PA-C Authorized by: Raylene Everts, MD   Consent:    Consent obtained:  Verbal   Consent given by:  Patient   Risks discussed:  Bleeding, incomplete drainage and pain   Alternatives discussed:  Alternative treatment Location:    Indications for incision and drainage: Paronychia.   Location:  Upper extremity   Upper extremity location:  Finger   Finger  location:  R thumb Pre-procedure details:    Skin preparation:  Betadine Anesthesia (see MAR for exact dosages):    Anesthesia method:  None Procedure type:    Complexity:  Simple Procedure details:    Incision types:  Single straight   Incision depth:  Dermal   Scalpel blade:  11   Drainage:  Purulent and bloody   Drainage amount:  Moderate   Wound treatment:  Wound left open Post-procedure details:    Patient tolerance of procedure:  Tolerated well, no immediate complications   (including critical care time)  Medications Ordered in UC Medications - No data to display  Initial Impression / Assessment and Plan / UC Course  I have reviewed the triage vital signs and the nursing notes.  Pertinent labs & imaging results that were available during my care of the patient were reviewed by me and considered in my medical decision making (see chart for details).     Paronychia to right thumb, drainage performed, initiating Keflex, recommending warm soaks and applying Bactroban topically.  Monitor for gradual resolution.  Discussed strict return precautions. Patient verbalized understanding and is agreeable with plan.  Final Clinical Impressions(s) / UC Diagnoses   Final diagnoses:  Paronychia of right thumb     Discharge Instructions     Keflex 4 times a day for 5 days Soak thumb in warm water twice daily, gentle massage to express further drainage Dry well and apply Bactroban twice daily Tylenol for mild to moderate pain Tramadol for severe pain-do not drive or work after taking  Follow-up if not improving or worsening    ED Prescriptions    Medication Sig Dispense Auth. Provider   cephALEXin (KEFLEX) 500 MG capsule Take 1 capsule (500 mg total) by mouth 4 (four) times daily for 5 days. 20 capsule Laquenta Whitsell C, PA-C   mupirocin ointment (BACTROBAN) 2 % Apply 1 application topically 2 (two) times daily. 30 g Rajvir Ernster C, PA-C   traMADol (ULTRAM) 50 MG tablet  Take 1 tablet (50 mg total) by mouth every 6 (six) hours as needed for severe pain. 8 tablet Jamarcus Laduke, [place] C, PA-C     I have reviewed the PDMP during this encounter.   Janith Lima, PA-C 05/15/20 1233

## 2020-05-15 NOTE — Discharge Instructions (Signed)
Keflex 4 times a day for 5 days Soak thumb in warm water twice daily, gentle massage to express further drainage Dry well and apply Bactroban twice daily Tylenol for mild to moderate pain Tramadol for severe pain-do not drive or work after taking  Follow-up if not improving or worsening

## 2020-05-15 NOTE — ED Triage Notes (Signed)
Pt states she has had pain and worsening swelling in her right thumb since Tuesday. Pt is oax4 and ambulatory.

## 2020-06-16 ENCOUNTER — Other Ambulatory Visit: Payer: Self-pay | Admitting: Cardiology

## 2020-06-16 ENCOUNTER — Other Ambulatory Visit: Payer: Self-pay

## 2020-06-16 ENCOUNTER — Encounter: Payer: Self-pay | Admitting: Cardiology

## 2020-06-16 ENCOUNTER — Ambulatory Visit (INDEPENDENT_AMBULATORY_CARE_PROVIDER_SITE_OTHER): Payer: Medicare Other | Admitting: Cardiology

## 2020-06-16 VITALS — BP 188/120 | HR 65 | Ht 64.0 in | Wt 238.0 lb

## 2020-06-16 DIAGNOSIS — F1990 Other psychoactive substance use, unspecified, uncomplicated: Secondary | ICD-10-CM | POA: Diagnosis not present

## 2020-06-16 DIAGNOSIS — N289 Disorder of kidney and ureter, unspecified: Secondary | ICD-10-CM | POA: Diagnosis not present

## 2020-06-16 DIAGNOSIS — I10 Essential (primary) hypertension: Secondary | ICD-10-CM

## 2020-06-16 MED ORDER — CLONIDINE HCL 0.2 MG PO TABS: 0.2000 mg | ORAL_TABLET | Freq: Every day | ORAL | 3 refills | Status: DC

## 2020-06-16 MED FILL — cloNIDine HCL 0.2 MG TABS: 0.2 | 90 days supply | Qty: 90 | Fill #0

## 2020-06-16 NOTE — Patient Instructions (Signed)
Medication Instructions:  Your physician has recommended you make the following change in your medication:  INCREASE: Clonidine 0.2 mg take one tablet by mouth daily.  *If you need a refill on your cardiac medications before your next appointment, please call your pharmacy*   Lab Work: None If you have labs (blood work) drawn today and your tests are completely normal, you will receive your results only by: Marland Kitchen MyChart Message (if you have MyChart) OR . A paper copy in the mail If you have any lab test that is abnormal or we need to change your treatment, we will call you to review the results.   Testing/Procedures: None   Follow-Up: At [facility], you and your health needs are our priority.  As part of our continuing mission to provide you with exceptional heart care, we have created designated Provider Care Teams.  These Care Teams include your primary Cardiologist (physician) and Advanced Practice Providers (APPs -  Physician Assistants and Nurse Practitioners) who all work together to provide you with the care you need, when you need it.  We recommend signing up for the patient portal called "MyChart".  Sign up information is provided on this After Visit Summary.  MyChart is used to connect with patients for Virtual Visits (Telemedicine).  Patients are able to view lab/test results, encounter notes, upcoming appointments, etc.  Non-urgent messages can be sent to your provider as well.   To learn more about what you can do with MyChart, go to [email or web address].    Your next appointment:   2 month(s)  The format for your next appointment:   In Person  Provider:   Norman Herrlich, MD   Other Instructions

## 2020-06-16 NOTE — Addendum Note (Signed)
Addended by: Delorse Limber I on: 06/16/2020 03:52 PM   Modules accepted: Orders

## 2020-06-16 NOTE — Progress Notes (Signed)
Cardiology Office Note:    Date:  06/16/2020   ID:  [name], DOB 1961-06-13, MRN [id number]  PCP:  Charlott Rakes, MD  Cardiologist:  Jenne Campus, MD    Referring MD: Charlott Rakes, MD   No chief complaint on file. Doing fine  History of Present Illness:    Angela Gardner is a 59 y.o. female with past medical history which is significant for hypertension which is difficult to control, left ventricle ejection fraction was preserved, hyperlipidemia, diastolic dysfunction, history of cocaine abuse.  Comes today 2 months of follow-up.  Overall she is doing well denies have any chest pain tightness squeezing pressure burning chest.  She said she feels great.  Still the problem is her blood pressure being elevated.  She tells me that today she did not take her medications.  I told her that she must take medication on the regular basis and every single time I see her I stressed this importance.  Luckily she is able to take her clonidine at evening time and tolerating this quite well.  Past Medical History:  Diagnosis Date  . Acute bronchitis 12/04/2008   Qualifier: Diagnosis of  By: Tomma Lightning MD, Claiborne Billings    . Acute CHF (congestive heart failure) ([place]) 09/12/2017  . Acute exacerbation of CHF (congestive heart failure) ([place]) 09/13/2017  . Anemia   . 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 Chest discomfort 09/12/2017   Negative stress test in May 2019  . Chest pain 12/20/2019  . CHF (congestive heart failure) ([place])   . Chronic kidney disease (CKD), stage III (moderate) 10/30/2017  . 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 Depression 04/02/2018  . Diastolic congestive heart failure, NYHA class 3 ([place]) 10/27/2017  . Diffuse pain 01/04/2018  . Dysfunctional uterine bleeding 09/12/2017  . Dyspnea    still having this and not moving around alot -gets worse with exertion  . Endometrial cancer ([place]) 10/19/2017  . Essential hypertension  10/27/2017  . History of sleep walking   . Hx of left breast biopsy   . Hyperlipemia   . Hypertension    not on any medication now-[facility] had  prescribed her a med and she stopped taking them cannot remember when  . Hypertensive crisis 09/12/2017  . Illicit drug use [id number]  . 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 Left knee pain   . Mild renal insufficiency 09/12/2017  . Morbid obesity ([place]) 10/09/2017  . Other constipation 11/28/2017  . OTITIS MEDIA 12/04/2008   Qualifier: Diagnosis of  By: Tomma Lightning MD, Claiborne Billings    . 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 PMB (postmenopausal bleeding) 06/17/2015  . Prediabetes 04/02/2018  . Preventative health care 11/07/2018  . Renal disorder   . 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 Vitamin D deficiency 02/15/2018    Past Surgical History:  Procedure Laterality Date  . BREAST BIOPSY     left breast  . CESAREAN SECTION     one  . CHOLECYSTECTOMY    . DILATION AND CURETTAGE OF UTERUS     approx 1 year ago  . IR IMAGING GUIDED PORT INSERTION  12/06/2017  . IR REMOVAL TUN ACCESS W/ PORT W/O FL MOD SED  06/19/2018  . LYMPH NODE BIOPSY N/A 11/14/2017   Procedure: SENTINEL LYMPH NODE BIOPSY;  Surgeon: Everitt Amber, MD;  Location: [facility];  Service: Gynecology;  Laterality: N/A;  . ROBOTIC ASSISTED TOTAL  HYSTERECTOMY WITH BILATERAL SALPINGO OOPHERECTOMY N/A 11/14/2017   Procedure: XI ROBOTIC ASSISTED TOTAL HYSTERECTOMY WITH BILATERAL SALPINGO OOPHORECTOMY, RIGHT PELVIC LYMPHADENECTOMY;  Surgeon: Adolphus Birchwood, MD;  Location: [facility];  Service: Gynecology;  Laterality: N/A;    Current Medications: Current Meds  Medication Sig  . acetaminophen (TYLENOL) 500 MG tablet Take 1,000 mg by mouth every 6 (six) hours as needed for moderate pain.   Marland Kitchen amLODipine (NORVASC) 10 MG tablet Take 1 tablet (10 mg total) by mouth daily.  Marland Kitchen atorvastatin (LIPITOR) 40 MG tablet Take 0.5 tablets (20 mg total) by mouth  daily.  . cloNIDine (CATAPRES) 0.1 MG tablet Take 1 tablet (0.1 mg total) by mouth daily.  . furosemide (LASIX) 40 MG tablet Take 1 tablet (40 mg total) by mouth 2 (two) times daily.  . hydrALAZINE (APRESOLINE) 50 MG tablet Take 1.5 tablets (75 mg total) by mouth 3 (three) times daily.  . pantoprazole (PROTONIX) 40 MG tablet Take 1 tablet (40 mg total) by mouth daily.  . [DISCONTINUED] mupirocin ointment (BACTROBAN) 2 % Apply 1 application topically 2 (two) times daily.     Allergies:   Vicodin [hydrocodone-acetaminophen]   Social History   Socioeconomic History  . Marital status: Single    Spouse name: Not on file  . Number of children: 1  . Years of education: Not on file  . Highest education level: 9th grade  Occupational History  . Not on file  Tobacco Use  . Smoking status: Former Smoker    Packs/day: 0.30    Years: 26.00    Pack years: 7.80    Types: Cigarettes  . Smokeless tobacco: Never Used  . Tobacco comment: quit May 2019  Vaping Use  . Vaping Use: Never used  Substance and Sexual Activity  . Alcohol use: Not Currently    Comment: social drinking  . Drug use: Yes    Types: Cocaine, "Crack" cocaine  . Sexual activity: Not Currently    Birth control/protection: Post-menopausal  Other Topics Concern  . Not on file  Social History Narrative  . Not on file   Social Determinants of Health   Financial Resource Strain: Not on file  Food Insecurity: Not on file  Transportation Needs: Not on file  Physical Activity: Not on file  Stress: Not on file  Social Connections: Not on file     Family History: The patient's family history includes Anuerysm in her mother; Congestive Heart Failure in her mother; Hypertension in her brother, father, and sister. There is no history of Colon cancer. ROS:   Please see the history of present illness.    All 14 point review of systems negative except as described per history of present illness  EKGs/Labs/Other Studies  Reviewed:      Recent Labs: 07/02/2019: ALT 14 12/20/2019: B Natriuretic Peptide 109.0; Magnesium 2.0; TSH 4.035 12/21/2019: Hemoglobin 12.7; Platelets 258 02/21/2020: BUN 18; Creatinine, Ser 1.38; Potassium 4.3; Sodium 143  Recent Lipid Panel    Component Value Date/Time   CHOL 248 (H) 07/02/2019 1135   TRIG 88 07/02/2019 1135   HDL 78 07/02/2019 1135   CHOLHDL 3.2 07/02/2019 1135   CHOLHDL 3.4 Ratio 03/30/2009 2039   VLDL 28 03/30/2009 2039   LDLCALC 155 (H) 07/02/2019 1135    Physical Exam:    VS:  BP (!) 188/120 (BP Location: Right Arm, Patient Position: Sitting)   Pulse 65   Ht 5\' 4"  (1.626 m)   Wt 238 lb (108 kg)   LMP  (  LMP Unknown) Comment: vaginal bleeding continuously for last 8 months  SpO2 95%   BMI 40.85 kg/m     Wt Readings from Last 3 Encounters:  06/16/20 238 lb (108 kg)  03/26/20 241 lb 12.8 oz (109.7 kg)  03/23/20 239 lb 6.4 oz (108.6 kg)     GEN:  Well nourished, well developed in no acute distress HEENT: Normal NECK: No JVD; No carotid bruits LYMPHATICS: No lymphadenopathy CARDIAC: RRR, no murmurs, no rubs, no gallops RESPIRATORY:  Clear to auscultation without rales, wheezing or rhonchi  ABDOMEN: Soft, non-tender, non-distended MUSCULOSKELETAL:  No edema; No deformity  SKIN: Warm and dry LOWER EXTREMITIES: no swelling NEUROLOGIC:  Alert and oriented x 3 PSYCHIATRIC:  Normal affect   ASSESSMENT:    1. Essential hypertension   2. Mild renal insufficiency   3. Illicit drug use    PLAN:    In order of problems listed above:  1. Essential hypertension which is difficult to control.  Still uncontrolled.  I will increase the dose of clonidine from 0.1 mg daily to 0.2 mg daily.  Rest of the medication will be the same.  I did stress importance repeatedly of taking her medications on the regular basis also ask her to check her blood pressure at home and bring results to me. 2. Mild renal insufficiency that is being stable. 3. Illicit drug use  cocaine she said that she stay clean.  I encouraged her to stay away from drugs.   Medication Adjustments/Labs and Tests Ordered: Current medicines are reviewed at length with the patient today.  Concerns regarding medicines are outlined above.  No orders of the defined types were placed in this encounter.  Medication changes: No orders of the defined types were placed in this encounter.   Signed, Park Liter, MD, [facility] 06/16/2020 3:47 PM     [facility]

## 2020-08-17 DIAGNOSIS — I509 Heart failure, unspecified: Secondary | ICD-10-CM | POA: Insufficient documentation

## 2020-08-19 ENCOUNTER — Other Ambulatory Visit: Payer: Self-pay | Admitting: Family Medicine

## 2020-08-19 DIAGNOSIS — I5032 Chronic diastolic (congestive) heart failure: Secondary | ICD-10-CM

## 2020-08-19 MED FILL — cloNIDine HCL 0.2 MG TABS: 0.2 | 90 days supply | Qty: 90 | Fill #0

## 2020-08-19 MED FILL — AMLODIPINE BESYLATE 10 MG T: 10 | 90 days supply | Qty: 90 | Fill #1

## 2020-08-19 MED FILL — hydrALAZINE HCL 50 MG TABS: 50 | 30 days supply | Qty: 135 | Fill #1

## 2020-08-19 NOTE — Telephone Encounter (Signed)
Requested medication (s) are due for refill today: expired medication  Requested medication (s) are on the active medication list: yes  Last refill:  12/22/19- 03/23/20 #60 0 refills  Future visit scheduled: no  Notes to clinic:  expired medication. Do you want to renew Rx?     Requested Prescriptions  Pending Prescriptions Disp Refills   pantoprazole (PROTONIX) 40 MG tablet [Pharmacy Med Name: PANTOPRAZOLE SOD DR 40 MG T 40 Tablet] 60 tablet 0    Sig: Take 1 tablet (40 mg total) by mouth daily.      Gastroenterology: Proton Pump Inhibitors Passed - 08/19/2020  3:55 PM      Passed - Valid encounter within last 12 months    Recent Outpatient Visits           7 months ago Viral upper respiratory tract infection   [place], Enobong, MD   1 year ago Screening for colon cancer   [place], Enobong, MD   1 year ago Essential hypertension   [place], Enobong, MD   1 year ago Endometrial cancer [facility])   [place], Charlane Ferretti, MD   2 years ago Chronic diastolic congestive heart failure, NYHA class 3 [facility])   [place], Enobong, MD       Future Appointments             In 5 days Park Liter, MD [facility]

## 2020-08-19 NOTE — Telephone Encounter (Signed)
Patient called, left VM to return the call to the office for an appointment.

## 2020-08-19 NOTE — Telephone Encounter (Signed)
Requested medication (s) are due for refill today:Yes  Requested medication (s) are on the active medication list: Yes  Last refill:  07/02/19  Future visit scheduled: No  Notes to clinic:  Unable to refill per protocol, Rx expired, appointment needed      Requested Prescriptions  Pending Prescriptions Disp Refills   furosemide (LASIX) 40 MG tablet [Pharmacy Med Name: FUROSEMIDE 40 MG TAB 40 Tablet] 180 tablet 1    Sig: Take 1 tablet (40 mg total) by mouth 2 (two) times daily.      Cardiovascular:  Diuretics - Loop Failed - 08/19/2020  8:56 AM      Failed - Cr in normal range and within 360 days    Creatinine  Date Value Ref Range Status  05/28/2018 1.14 (H) 0.44 - 1.00 mg/dL Final   Creatinine, Ser  Date Value Ref Range Status  02/21/2020 1.38 (H) 0.57 - 1.00 mg/dL Final   Creatinine,U  Date Value Ref Range Status  10/23/2009 395.6 mg/dL Final    Comment:    See lab report for associated comment(s)          Failed - Last BP in normal range    BP Readings from Last 1 Encounters:  06/16/20 (!) 188/120          Failed - Valid encounter within last 6 months    Recent Outpatient Visits           7 months ago Viral upper respiratory tract infection   [place], [place], MD   1 year ago Screening for colon cancer   [place], Charlane Ferretti, MD   1 year ago Essential hypertension   [place], Charlane Ferretti, MD   1 year ago Endometrial cancer [facility])   [place], Enobong, MD   2 years ago Chronic diastolic congestive heart failure, NYHA class 3 ([place])   [place], Charlane Ferretti, MD       Future Appointments             In 5 days Park Liter, MD [facility] - K in normal range and within 360 days    Potassium  Date Value Ref Range Status   02/21/2020 4.3 3.5 - 5.2 mmol/L Final          Passed - Ca in normal range and within 360 days    Calcium  Date Value Ref Range Status  02/21/2020 9.5 8.7 - 10.2 mg/dL Final   Calcium, Ion  Date Value Ref Range Status  03/18/2009 1.11 (L) 1.12 - 1.32 mmol/L Final          Passed - Na in normal range and within 360 days    Sodium  Date Value Ref Range Status  02/21/2020 143 134 - 144 mmol/L Final

## 2020-08-20 ENCOUNTER — Other Ambulatory Visit: Payer: Self-pay | Admitting: Family Medicine

## 2020-08-20 MED FILL — PANTOPRAZOLE SOD DR 40 MG T: 40 | 30 days supply | Qty: 30 | Fill #0

## 2020-08-24 ENCOUNTER — Ambulatory Visit: Payer: Medicare Other | Admitting: Cardiology

## 2020-08-28 ENCOUNTER — Other Ambulatory Visit: Payer: Self-pay | Admitting: Family Medicine

## 2020-08-28 DIAGNOSIS — I5032 Chronic diastolic (congestive) heart failure: Secondary | ICD-10-CM

## 2020-08-28 MED FILL — PANTOPRAZOLE SOD DR 40 MG T: 40 | 30 days supply | Qty: 30 | Fill #0

## 2020-08-28 MED FILL — cloNIDine HCL 0.2 MG TABS: 0.2 | 90 days supply | Qty: 90 | Fill #0

## 2020-08-28 MED FILL — AMLODIPINE BESYLATE 10 MG T: 10 | 90 days supply | Qty: 90 | Fill #1

## 2020-08-28 MED FILL — hydrALAZINE HCL 50 MG TABS: 50 | 30 days supply | Qty: 135 | Fill #1

## 2020-09-02 ENCOUNTER — Ambulatory Visit: Payer: Medicare Other | Admitting: Cardiology

## 2020-09-24 ENCOUNTER — Telehealth: Payer: Self-pay | Admitting: Family Medicine

## 2020-09-24 ENCOUNTER — Ambulatory Visit: Payer: Medicare Other | Admitting: Physician Assistant

## 2020-09-24 NOTE — Telephone Encounter (Signed)
Called patient, unable to LVM because [facility]. Calling to inform patient that the provider had an emergency and is going to be out of the office today. If patient would like to see a provider today we can offer mobile medicine unit or to have her scheduled this afternoon at one of our other locations.

## 2020-09-25 ENCOUNTER — Encounter: Payer: Self-pay | Admitting: Cardiology

## 2020-09-25 ENCOUNTER — Ambulatory Visit (INDEPENDENT_AMBULATORY_CARE_PROVIDER_SITE_OTHER): Payer: Medicare Other | Admitting: Cardiology

## 2020-09-25 ENCOUNTER — Other Ambulatory Visit: Payer: Self-pay

## 2020-09-25 VITALS — BP 170/102 | HR 74 | Ht 64.0 in | Wt 249.0 lb

## 2020-09-25 DIAGNOSIS — I5032 Chronic diastolic (congestive) heart failure: Secondary | ICD-10-CM | POA: Diagnosis not present

## 2020-09-25 DIAGNOSIS — N289 Disorder of kidney and ureter, unspecified: Secondary | ICD-10-CM | POA: Diagnosis not present

## 2020-09-25 DIAGNOSIS — F1990 Other psychoactive substance use, unspecified, uncomplicated: Secondary | ICD-10-CM

## 2020-09-25 DIAGNOSIS — Z9119 Patient's noncompliance with other medical treatment and regimen: Secondary | ICD-10-CM

## 2020-09-25 DIAGNOSIS — E782 Mixed hyperlipidemia: Secondary | ICD-10-CM

## 2020-09-25 DIAGNOSIS — Z91199 Patient's noncompliance with other medical treatment and regimen due to unspecified reason: Secondary | ICD-10-CM | POA: Insufficient documentation

## 2020-09-25 DIAGNOSIS — I10 Essential (primary) hypertension: Secondary | ICD-10-CM

## 2020-09-25 NOTE — Patient Instructions (Signed)

## 2020-09-25 NOTE — Progress Notes (Signed)
Cardiology Office Note:    Date:  09/25/2020   ID:  Angela Gardner, DOB 26-Jan-1961, MRN [id number]  PCP:  Charlott Rakes, MD  Cardiologist:  Jenne Campus, MD    Referring MD: Charlott Rakes, MD   Chief Complaint  Patient presents with  . Follow-up    History of Present Illness:    Angela Gardner is a 60 y.o. female with past medical history significant for essential hypertension which is very difficult to manage because of her noncompliance, also diastolic congestive heart failure, dyslipidemia, tobacco abuse.  She comes today 2 months of follow-up overall she is doing well she said she denies having any chest pain tightness squeezing pressure burning chest just some shortness of breath.  And again today for the second time she comes in she tells me that she did not take her medications today I did stress many times importance of taking his medications and I told her that this high blood pressure eventually will kill her if she will not start s taking care of herself.  Also, ask if she check her blood pressure at home she says she does however blood pressure monitor she has is on the wrist and I told her she need to get blood pressure monitor on the shoulder.  Past Medical History:  Diagnosis Date  . Acute bronchitis 12/04/2008   Qualifier: Diagnosis of  By: Tomma Lightning MD, Claiborne Billings    . Acute CHF (congestive heart failure) ([place]) 09/12/2017  . Acute exacerbation of CHF (congestive heart failure) ([place]) 09/13/2017  . Anemia   . 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 Chest discomfort 09/12/2017   Negative stress test in May 2019  . Chest pain 12/20/2019  . CHF (congestive heart failure) ([place])   . Chronic kidney disease (CKD), stage III (moderate) 10/30/2017  . 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 Depression 04/02/2018  . Diastolic congestive heart failure, NYHA class 3 ([place]) 10/27/2017  . Diffuse pain 01/04/2018  . Dysfunctional uterine  bleeding 09/12/2017  . Dyspnea    still having this and not moving around alot -gets worse with exertion  . Endometrial cancer ([place]) 10/19/2017  . Essential hypertension 10/27/2017  . History of sleep walking   . Hx of left breast biopsy   . Hyperlipemia   . Hypertension    not on any medication now-[facility] had  prescribed her a med and she stopped taking them cannot remember when  . Hypertensive crisis 09/12/2017  . Illicit drug use 7/65/4650  . 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 Left knee pain   . Mild renal insufficiency 09/12/2017  . Morbid obesity ([place]) 10/09/2017  . Other constipation 11/28/2017  . OTITIS MEDIA 12/04/2008   Qualifier: Diagnosis of  By: Tomma Lightning MD, Claiborne Billings    . 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 PMB (postmenopausal bleeding) 06/17/2015  . Prediabetes 04/02/2018  . Preventative health care 11/07/2018  . Renal disorder   . 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 Vitamin D deficiency 02/15/2018    Past Surgical History:  Procedure Laterality Date  . BREAST BIOPSY     left breast  . CESAREAN SECTION     one  . CHOLECYSTECTOMY    . DILATION AND CURETTAGE OF UTERUS     approx 1 year ago  . IR IMAGING GUIDED PORT INSERTION  12/06/2017  . IR REMOVAL TUN ACCESS W/ PORT W/O FL MOD SED  06/19/2018  . LYMPH NODE BIOPSY N/A 11/14/2017   Procedure: SENTINEL LYMPH NODE BIOPSY;  Surgeon: Everitt Amber, MD;  Location: [facility];  Service: Gynecology;  Laterality: N/A;  . ROBOTIC ASSISTED TOTAL HYSTERECTOMY WITH BILATERAL SALPINGO OOPHERECTOMY N/A 11/14/2017   Procedure: XI ROBOTIC ASSISTED TOTAL HYSTERECTOMY WITH BILATERAL SALPINGO OOPHORECTOMY, RIGHT PELVIC LYMPHADENECTOMY;  Surgeon: Everitt Amber, MD;  Location: [facility];  Service: Gynecology;  Laterality: N/A;    Current Medications: Current Meds  Medication Sig  . amLODipine (NORVASC) 10 MG tablet TAKE 1 TABLET (10 MG TOTAL) BY MOUTH DAILY. (Patient taking  differently: Take 10 mg by mouth daily.)  . atorvastatin (LIPITOR) 40 MG tablet Take 0.5 tablets (20 mg total) by mouth daily.  . cloNIDine (CATAPRES) 0.2 MG tablet TAKE 1 TABLET (0.2 MG TOTAL) BY MOUTH AT BEDTIME. (Patient taking differently: Take 0.2 mg by mouth daily.)  . furosemide (LASIX) 40 MG tablet Take 1 tablet (40 mg total) by mouth 2 (two) times daily.  . hydrALAZINE (APRESOLINE) 50 MG tablet Take 1.5 tablets (75 mg total) by mouth 3 (three) times daily.  . pantoprazole (PROTONIX) 40 MG tablet TAKE 1 TABLET (40 MG TOTAL) BY MOUTH DAILY. (Patient taking differently: Take 40 mg by mouth daily.)     Allergies:   Vicodin [hydrocodone-acetaminophen]   Social History   Socioeconomic History  . Marital status: Single    Spouse name: Not on file  . Number of children: 1  . Years of education: Not on file  . Highest education level: 9th grade  Occupational History  . Not on file  Tobacco Use  . Smoking status: Former Smoker    Packs/day: 0.30    Years: 26.00    Pack years: 7.80    Types: Cigarettes  . Smokeless tobacco: Never Used  . Tobacco comment: quit May 2019  Vaping Use  . Vaping Use: Never used  Substance and Sexual Activity  . Alcohol use: Not Currently    Comment: social drinking  . Drug use: Yes    Types: Cocaine, "Crack" cocaine  . Sexual activity: Not Currently    Birth control/protection: Post-menopausal  Other Topics Concern  . Not on file  Social History Narrative  . Not on file   Social Determinants of Health   Financial Resource Strain: Not on file  Food Insecurity: Not on file  Transportation Needs: Not on file  Physical Activity: Not on file  Stress: Not on file  Social Connections: Not on file     Family History: The patient's family history includes Anuerysm in her mother; Congestive Heart Failure in her mother; Hypertension in her brother, father, and sister. There is no history of Colon cancer. ROS:   Please see the history of present  illness.    All 14 point review of systems negative except as described per history of present illness  EKGs/Labs/Other Studies Reviewed:      Recent Labs: 12/20/2019: B Natriuretic Peptide 109.0; Magnesium 2.0; TSH 4.035 12/21/2019: Hemoglobin 12.7; Platelets 258 02/21/2020: BUN 18; Creatinine, Ser 1.38; Potassium 4.3; Sodium 143  Recent Lipid Panel    Component Value Date/Time   CHOL 248 (H) 07/02/2019 1135   TRIG 88 07/02/2019 1135   HDL 78 07/02/2019 1135   CHOLHDL 3.2 07/02/2019 1135   CHOLHDL 3.4 Ratio 03/30/2009 2039   VLDL 28 03/30/2009 2039   LDLCALC 155 (H) 07/02/2019 1135    Physical Exam:    VS:  BP (!) 170/102 (BP Location: Right Arm, Patient Position: Sitting)  Pulse 74   Ht 5\' 4"  (1.626 m)   Wt 249 lb (112.9 kg)   LMP  (LMP Unknown) Comment: vaginal bleeding continuously for last 8 months  SpO2 96%   BMI 42.74 kg/m     Wt Readings from Last 3 Encounters:  09/25/20 249 lb (112.9 kg)  06/16/20 238 lb (108 kg)  03/26/20 241 lb 12.8 oz (109.7 kg)     GEN:  Well nourished, well developed in no acute distress HEENT: Normal NECK: No JVD; No carotid bruits LYMPHATICS: No lymphadenopathy CARDIAC: RRR, no murmurs, no rubs, no gallops RESPIRATORY:  Clear to auscultation without rales, wheezing or rhonchi  ABDOMEN: Soft, non-tender, non-distended MUSCULOSKELETAL:  No edema; No deformity  SKIN: Warm and dry LOWER EXTREMITIES: no swelling NEUROLOGIC:  Alert and oriented x 3 PSYCHIATRIC:  Normal affect   ASSESSMENT:    1. Primary hypertension   2. Chronic diastolic congestive heart failure, NYHA class 3 ([place])   3. Mild renal insufficiency   4. Mixed hyperlipidemia   5. Illicit drug use   6. Noncompliance    PLAN:    In order of problems listed above:  1. Essential hypertension which is very difficult to control and the biggest issue is simply noncompliance she is on decent medications but we cannot see effect of this medication because every single time  she comes to my office she did not take her medications I spent at least 5 to 10 minutes talking to very stressed importance of taking those medications hopefully she will be able to do it.  I told her likely that she will die because of this high blood pressure if she will not take care of herself.  Also asked to get good blood pressure monitor with blood pressure measurements of the shoulder. 2. Mild renal insufficiency, will check a Chem-7 today. 3. Drug abuse.  Told her not to use drugs. 4. Noncompliance with undoubtedly is the biggest problem that affected her hopefully I convince her to take her medication on the regular basis.   Medication Adjustments/Labs and Tests Ordered: Current medicines are reviewed at length with the patient today.  Concerns regarding medicines are outlined above.  No orders of the defined types were placed in this encounter.  Medication changes: No orders of the defined types were placed in this encounter.   Signed, Park Liter, MD, [facility] 09/25/2020 11:27 AM    [place]

## 2020-09-26 LAB — BASIC METABOLIC PANEL
BUN/Creatinine Ratio: 12 (ref 9–23)
BUN: 20 mg/dL (ref 6–24)
CO2: 20 mmol/L (ref 20–29)
Calcium: 9.2 mg/dL (ref 8.7–10.2)
Chloride: 108 mmol/L — ABNORMAL HIGH (ref 96–106)
Creatinine, Ser: 1.66 mg/dL — ABNORMAL HIGH (ref 0.57–1.00)
Glucose: 105 mg/dL — ABNORMAL HIGH (ref 65–99)
Potassium: 3.7 mmol/L (ref 3.5–5.2)
Sodium: 144 mmol/L (ref 134–144)
eGFR: 36 mL/min/{1.73_m2} — ABNORMAL LOW (ref >59)

## 2020-11-12 ENCOUNTER — Ambulatory Visit: Payer: Medicare Other | Admitting: Physician Assistant

## 2020-11-20 ENCOUNTER — Ambulatory Visit: Payer: Self-pay | Admitting: *Deleted

## 2020-11-20 ENCOUNTER — Other Ambulatory Visit: Payer: Self-pay

## 2020-11-20 ENCOUNTER — Other Ambulatory Visit: Payer: Self-pay | Admitting: Family Medicine

## 2020-11-20 MED ORDER — PANTOPRAZOLE SODIUM 40 MG PO TBEC
DELAYED_RELEASE_TABLET | Freq: Every day | ORAL | 0 refills | Status: DC
Start: 2020-11-20 — End: 2021-06-18
  Filled 2020-11-20: qty 60, 60d supply, fill #0

## 2020-11-20 NOTE — Telephone Encounter (Signed)
C/o not feeling well after eating 2 hotdogs 5 minutes ago . C/o chest pain middle of chest, pain comes and goes. Patient was laying down. Instructed patient to sit up for atleast 30 minutes after eating. Patient sitting up and reports she is feeling better. Patient reports she has taken her last protonix and is out of medication. C/o right arm pain after a fall 2 weeks ago . Patient is able to use arm and reports she also stepped on a nail with her left foot 6 days ago . Patient report she has been taking care of left foot wound and area is looking good. No drainage or red. Reports she has has tetanus shot last year. Encouraged patient if chest pain continues go to UC or ED. Patient hs appt scheduled for 11/24/20. Will submit request for protonix refill.care advise given. Patient verbalized understanding of care advise and to call back or go to [facility] or ED if symptoms worsen.   Reason for Disposition . [1] Chest pain lasts < 5 minutes AND [2] NO chest pain or cardiac symptoms (e.g., breathing difficulty, sweating) now (Exception: chest pains that last only a few seconds)  Answer Assessment - Initial Assessment Questions 1. LOCATION: "Where does it hurt?"       In middle of chest  2. RADIATION: "Does the pain go anywhere else?" (e.g., into neck, jaw, arms, back)     No  3. ONSET: "When did the chest pain begin?" (Minutes, hours or days)      5 minutes ago  4. PATTERN "Does the pain come and go, or has it been constant since it started?"  "Does it get worse with exertion?"      Comes and goes  5. DURATION: "How long does it last" (e.g., seconds, minutes, hours)     na 6. SEVERITY: "How bad is the pain?"  (e.g., Scale 1-10; mild, moderate, or severe)    - MILD (1-3): doesn't interfere with normal activities     - MODERATE (4-7): interferes with normal activities or awakens from sleep    - SEVERE (8-10): excruciating pain, unable to do any normal activities       Mild  7. CARDIAC RISK FACTORS: "Do you  have any history of heart problems or risk factors for heart disease?" (e.g., angina, prior heart attack; diabetes, high blood pressure, high cholesterol, smoker, or strong family history of heart disease)     Yes  8. PULMONARY RISK FACTORS: "Do you have any history of lung disease?"  (e.g., blood clots in lung, asthma, emphysema, birth control pills)     na 9. CAUSE: "What do you think is causing the chest pain?"     Heart burn just ate 2 hotdogs 10. OTHER SYMPTOMS: "Do you have any other symptoms?" (e.g., dizziness, nausea, vomiting, sweating, fever, difficulty breathing, cough)       Right arm pain x 2 weeks after a fall,  11. PREGNANCY: "Is there any chance you are pregnant?" "When was your last menstrual period?"       na  Protocols used: CHEST PAIN-A-AH

## 2020-11-24 ENCOUNTER — Other Ambulatory Visit: Payer: Self-pay

## 2020-11-24 ENCOUNTER — Ambulatory Visit: Payer: Medicare Other | Attending: Physician Assistant | Admitting: Family Medicine

## 2020-11-24 ENCOUNTER — Encounter: Payer: Self-pay | Admitting: Family Medicine

## 2020-11-24 DIAGNOSIS — I5032 Chronic diastolic (congestive) heart failure: Secondary | ICD-10-CM

## 2020-11-24 DIAGNOSIS — Z1211 Encounter for screening for malignant neoplasm of colon: Secondary | ICD-10-CM

## 2020-11-24 DIAGNOSIS — Z720 Tobacco use: Secondary | ICD-10-CM | POA: Diagnosis not present

## 2020-11-24 DIAGNOSIS — F17210 Nicotine dependence, cigarettes, uncomplicated: Secondary | ICD-10-CM

## 2020-11-24 DIAGNOSIS — I10 Essential (primary) hypertension: Secondary | ICD-10-CM | POA: Diagnosis not present

## 2020-11-24 DIAGNOSIS — Z1231 Encounter for screening mammogram for malignant neoplasm of breast: Secondary | ICD-10-CM | POA: Diagnosis not present

## 2020-11-24 MED ORDER — BUPROPION HCL ER (XL) 150 MG PO TB24
150.0000 mg | ORAL_TABLET | Freq: Every day | ORAL | 3 refills | Status: DC
  Filled 2020-11-24: qty 30, 30d supply, fill #0
  Filled 2021-01-13: qty 30, 30d supply, fill #1
  Filled 2021-06-18: qty 30, 30d supply, fill #2

## 2020-11-24 MED ORDER — AMLODIPINE BESYLATE 10 MG PO TABS
10.0000 mg | ORAL_TABLET | Freq: Every day | ORAL | 1 refills | Status: DC
  Filled 2020-11-24: qty 90, 90d supply, fill #0
  Filled 2021-06-18: qty 90, 90d supply, fill #1

## 2020-11-24 MED ORDER — FUROSEMIDE 40 MG PO TABS
40.0000 mg | ORAL_TABLET | Freq: Two times a day (BID) | ORAL | 1 refills | Status: DC
  Filled 2020-11-24: qty 180, 90d supply, fill #0
  Filled 2021-06-18: qty 180, 90d supply, fill #1

## 2020-11-24 MED ORDER — ATORVASTATIN CALCIUM 40 MG PO TABS
20.0000 mg | ORAL_TABLET | Freq: Every day | ORAL | 1 refills | Status: DC
  Filled 2020-11-24: qty 45, 90d supply, fill #0
  Filled 2021-06-18: qty 45, 90d supply, fill #1

## 2020-11-24 NOTE — Progress Notes (Signed)
Virtual Visit via Telephone Note  I connected with Angela Gardner, on 11/24/2020 at 10:57 AM by telephone due to the COVID-19 pandemic and verified that I am speaking with the correct person using two identifiers.   Consent: I discussed the limitations, risks, security and privacy concerns of performing an evaluation and management service by telephone and the availability of in person appointments. I also discussed with the patient that there may be a patient responsible charge related to this service. The patient expressed understanding and agreed to proceed.   Location of Patient: Home  Location of Provider: Clinic   Persons participating in Telemedicine visit: Angela Gardner Dr. Margarita Rana     History of Present Illness: Angela Gardner a 60 year old female with a history of hypertension, stage III chronic kidney disease, diastolic CHF (EF 60 to 42% from 07/2018), Endometrial carcinoma diagnosed in 09/2017 s/p total abdominal hysterectomy and bilateral salpingo-oophorectomy on 11/14/2017, status post radiation and chemotherapy who presents today for follow-up visit.    She has no chest pains or dyspnea. Did have an episode of reflux after she had ingested spicy foods for which omeprazole prescription was sent to her pharmacy which provided relief. Last seen by Cardiology in 09/2020 and her blood pressure was elevated at that time.  Amlodipine dose was increased.  She does not check her blood pressures at home. Smokes 2-3 cig/day and is not ready to quit. Denies presence of chest pain or dyspnea and has no pedal edema. For her history of endometrial carcinoma she was seen by radiology, Dr. Luretha Rued  in 03/2021) notes no evidence of recurrence with follow-up recommended in 1 year and she will follow-up with GYN oncology in 6 months from that visit.  Past Medical History:  Diagnosis Date  . Acute bronchitis 12/04/2008   Qualifier: Diagnosis of  By: Tomma Lightning MD, Claiborne Billings    . Acute CHF  (congestive heart failure) ([place]) 09/12/2017  . Acute exacerbation of CHF (congestive heart failure) ([place]) 09/13/2017  . Anemia   . 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 Chest discomfort 09/12/2017   Negative stress test in May 2019  . Chest pain 12/20/2019  . CHF (congestive heart failure) ([place])   . Chronic kidney disease (CKD), stage III (moderate) 10/30/2017  . 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 Depression 04/02/2018  . Diastolic congestive heart failure, NYHA class 3 ([place]) 10/27/2017  . Diffuse pain 01/04/2018  . Dysfunctional uterine bleeding 09/12/2017  . Dyspnea    still having this and not moving around alot -gets worse with exertion  . Endometrial cancer ([place]) 10/19/2017  . Essential hypertension 10/27/2017  . History of sleep walking   . Hx of left breast biopsy   . Hyperlipemia   . Hypertension    not on any medication now-[facility] had  prescribed her a med and she stopped taking them cannot remember when  . Hypertensive crisis 09/12/2017  . Illicit drug use 5/95/6387  . 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 Left knee pain   . Mild renal insufficiency 09/12/2017  . Morbid obesity ([place]) 10/09/2017  . Other constipation 11/28/2017  . OTITIS MEDIA 12/04/2008   Qualifier: Diagnosis of  By: Tomma Lightning MD, Claiborne Billings    . 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 PMB (postmenopausal bleeding) 06/17/2015  . Prediabetes 04/02/2018  . Preventative health care 11/07/2018  . Renal disorder   . 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  Vitamin D deficiency 02/15/2018   Allergies  Allergen Reactions  . Vicodin [Hydrocodone-Acetaminophen] Itching    Current Outpatient Medications on File Prior to Visit  Medication Sig Dispense Refill  . amLODipine (NORVASC) 10 MG tablet TAKE 1 TABLET (10 MG TOTAL) BY MOUTH DAILY. (Patient taking differently: Take 10 mg by mouth daily.) 90 tablet 1  .  atorvastatin (LIPITOR) 40 MG tablet Take 0.5 tablets (20 mg total) by mouth daily. 30 tablet 5  . cloNIDine (CATAPRES) 0.2 MG tablet TAKE 1 TABLET (0.2 MG TOTAL) BY MOUTH AT BEDTIME. (Patient taking differently: Take 0.2 mg by mouth daily.) 90 tablet 3  . furosemide (LASIX) 40 MG tablet Take 1 tablet (40 mg total) by mouth 2 (two) times daily. 180 tablet 1  . hydrALAZINE (APRESOLINE) 50 MG tablet Take 1.5 tablets (75 mg total) by mouth 3 (three) times daily. 270 tablet 1  . pantoprazole (PROTONIX) 40 MG tablet TAKE 1 TABLET (40 MG TOTAL) BY MOUTH DAILY. 60 tablet 0  . [DISCONTINUED] prochlorperazine (COMPAZINE) 10 MG tablet Take 1 tablet (10 mg total) by mouth every 6 (six) hours as needed (Nausea or vomiting). (Patient not taking: Reported on 05/25/2018) 30 tablet 1   No current facility-administered medications on file prior to visit.    ROS: See HPI   Observations/Objective: Alert, awake, oriented x3 Not in acute distress  Assessment and Plan: 1. Essential hypertension Uncontrolled from last office visit with Cardiology Compliance with medications emphasized Counseled on blood pressure goal of less than 130/80, low-sodium, DASH diet, medication compliance, 150 minutes of moderate intensity exercise per week. Discussed medication compliance, adverse effects. - amLODipine (NORVASC) 10 MG tablet; Take 1 tablet (10 mg total) by mouth daily.  Dispense: 90 tablet; Refill: 1  2. Chronic diastolic congestive heart failure, NYHA class 3 (HCC) EF of 60% from 12/2019 Euvolemic - atorvastatin (LIPITOR) 40 MG tablet; Take 0.5 tablets (20 mg total) by mouth daily.  Dispense: 90 tablet; Refill: 1 - furosemide (LASIX) 40 MG tablet; Take 1 tablet (40 mg total) by mouth 2 (two) times daily.  Dispense: 180 tablet; Refill: 1  3. Screening for colon cancer - Ambulatory referral to Gastroenterology  4. Encounter for screening mammogram for malignant neoplasm of breast - MM 3D SCREEN BREAST BILATERAL;  Future  5. Tobacco abuse Spent 3 minutes counseling on smoking cessation and she is ready to work on cessation. - buPROPion (WELLBUTRIN XL) 150 MG 24 hr tablet; Take 1 tablet (150 mg total) by mouth daily. For smoking cessation  Dispense: 30 tablet; Refill: 3   Follow Up Instructions: 3 months for chronic disease management   I discussed the assessment and treatment plan with the patient. The patient was provided an opportunity to ask questions and all were answered. The patient agreed with the plan and demonstrated an understanding of the instructions.   The patient was advised to call back or seek an in-person evaluation if the symptoms worsen or if the condition fails to improve as anticipated.     I provided 14 minutes total of non-face-to-face time during this encounter.   Charlott Rakes, MD, FAAFP. [facility] [place], [place]   11/24/2020, 10:57 AM

## 2020-11-26 ENCOUNTER — Other Ambulatory Visit: Payer: Self-pay

## 2021-01-02 ENCOUNTER — Other Ambulatory Visit: Payer: Self-pay

## 2021-01-02 ENCOUNTER — Encounter (HOSPITAL_COMMUNITY): Payer: Self-pay | Admitting: Emergency Medicine

## 2021-01-02 ENCOUNTER — Emergency Department (HOSPITAL_COMMUNITY): Payer: Medicare Other

## 2021-01-02 ENCOUNTER — Emergency Department (HOSPITAL_COMMUNITY)
Admission: EM | Admit: 2021-01-02 | Discharge: 2021-01-02 | Disposition: A | Discharge status: Home / Self Care | Payer: Medicare Other | Attending: Emergency Medicine | Admitting: Emergency Medicine

## 2021-01-02 DIAGNOSIS — N183 Chronic kidney disease, stage 3 unspecified: Secondary | ICD-10-CM | POA: Diagnosis not present

## 2021-01-02 DIAGNOSIS — I130 Hypertensive heart and chronic kidney disease with heart failure and stage 1 through stage 4 chronic kidney disease, or unspecified chronic kidney disease: Secondary | ICD-10-CM | POA: Diagnosis not present

## 2021-01-02 DIAGNOSIS — I5030 Unspecified diastolic (congestive) heart failure: Secondary | ICD-10-CM | POA: Insufficient documentation

## 2021-01-02 DIAGNOSIS — N189 Chronic kidney disease, unspecified: Secondary | ICD-10-CM

## 2021-01-02 DIAGNOSIS — Z87891 Personal history of nicotine dependence: Secondary | ICD-10-CM | POA: Diagnosis not present

## 2021-01-02 DIAGNOSIS — Z79899 Other long term (current) drug therapy: Secondary | ICD-10-CM | POA: Insufficient documentation

## 2021-01-02 DIAGNOSIS — R600 Localized edema: Secondary | ICD-10-CM | POA: Insufficient documentation

## 2021-01-02 DIAGNOSIS — R0789 Other chest pain: Secondary | ICD-10-CM | POA: Diagnosis not present

## 2021-01-02 DIAGNOSIS — I129 Hypertensive chronic kidney disease with stage 1 through stage 4 chronic kidney disease, or unspecified chronic kidney disease: Secondary | ICD-10-CM | POA: Diagnosis not present

## 2021-01-02 DIAGNOSIS — R0602 Shortness of breath: Secondary | ICD-10-CM | POA: Diagnosis not present

## 2021-01-02 DIAGNOSIS — Z853 Personal history of malignant neoplasm of breast: Secondary | ICD-10-CM | POA: Insufficient documentation

## 2021-01-02 DIAGNOSIS — R079 Chest pain, unspecified: Secondary | ICD-10-CM | POA: Diagnosis not present

## 2021-01-02 DIAGNOSIS — Z8542 Personal history of malignant neoplasm of other parts of uterus: Secondary | ICD-10-CM | POA: Insufficient documentation

## 2021-01-02 DIAGNOSIS — I517 Cardiomegaly: Secondary | ICD-10-CM | POA: Diagnosis not present

## 2021-01-02 LAB — BASIC METABOLIC PANEL
Anion gap: 9 (ref 5–15)
BUN: 29 mg/dL — ABNORMAL HIGH (ref 6–20)
CO2: 25 mmol/L (ref 22–32)
Calcium: 8.8 mg/dL — ABNORMAL LOW (ref 8.9–10.3)
Chloride: 106 mmol/L (ref 98–111)
Creatinine, Ser: 2.03 mg/dL — ABNORMAL HIGH (ref 0.44–1.00)
GFR, Estimated: 28 mL/min — ABNORMAL LOW (ref >60)
Glucose, Bld: 119 mg/dL — ABNORMAL HIGH (ref 70–99)
Potassium: 3.9 mmol/L (ref 3.5–5.1)
Sodium: 140 mmol/L (ref 135–145)

## 2021-01-02 LAB — CBC WITH DIFFERENTIAL/PLATELET
Abs Immature Granulocytes: 0.05 10*3/uL (ref 0.00–0.07)
Basophils Absolute: 0 10*3/uL (ref 0.0–0.1)
Basophils Relative: 0 %
Eosinophils Absolute: 0 10*3/uL (ref 0.0–0.5)
Eosinophils Relative: 0 %
HCT: 44.4 % (ref 36.0–46.0)
Hemoglobin: 14.6 g/dL (ref 12.0–15.0)
Immature Granulocytes: 1 %
Lymphocytes Relative: 20 %
Lymphs Abs: 1.4 10*3/uL (ref 0.7–4.0)
MCH: 29.7 pg (ref 26.0–34.0)
MCHC: 32.9 g/dL (ref 30.0–36.0)
MCV: 90.4 fL (ref 80.0–100.0)
Monocytes Absolute: 0.4 10*3/uL (ref 0.1–1.0)
Monocytes Relative: 6 %
Neutro Abs: 5.1 10*3/uL (ref 1.7–7.7)
Neutrophils Relative %: 73 %
Platelets: 282 10*3/uL (ref 150–400)
RBC: 4.91 MIL/uL (ref 3.87–5.11)
RDW: 16 % — ABNORMAL HIGH (ref 11.5–15.5)
WBC: 7 10*3/uL (ref 4.0–10.5)
nRBC: 0 % (ref 0.0–0.2)

## 2021-01-02 LAB — BRAIN NATRIURETIC PEPTIDE: B Natriuretic Peptide: 66.7 pg/mL (ref 0.0–100.0)

## 2021-01-02 LAB — TROPONIN I (HIGH SENSITIVITY)
Troponin I (High Sensitivity): 20 ng/L — ABNORMAL HIGH (ref <18)
Troponin I (High Sensitivity): 20 ng/L — ABNORMAL HIGH (ref <18)

## 2021-01-02 NOTE — ED Triage Notes (Signed)
Patient coming from home. Patient complaining of chest pain and shortness of breath for several days. Endorses increased swelling as well.

## 2021-01-02 NOTE — Discharge Instructions (Addendum)
Please decrease your Lasix (furosemide) dose to twice per day instead of three times per day for the next three days and follow up with your doctor for a recheck of your labs.

## 2021-01-02 NOTE — ED Provider Notes (Signed)
[facility] EMERGENCY DEPARTMENT Provider Note  CSN: [id number] Arrival date & time: 01/02/21 1712    History Chief Complaint  Patient presents with   Chest Pain   Shortness of Breath    Angela Gardner is a 60 y.o. female with history of dialstolic CHF, CKD, no known CAD reports she has had intermittent throbbing left upper chest pain for the last 2 weeks, no particular provoking or relieving factors. Pain radiates into L arm/shoulder. She has chronic SOB, continues to smoke, no change in that symptom. No fever or cough. No N/V/D. Some lower extremity edema, also chronic.    Past Medical History:  Diagnosis Date   Acute bronchitis 12/04/2008   Qualifier: Diagnosis of  By: Tomma Lightning MD, Claiborne Billings     Acute CHF (congestive heart failure) ([place]) 09/12/2017   Acute exacerbation of CHF (congestive heart failure) ([place]) 09/13/2017   Anemia    Antineoplastic chemotherapy induced anemia 04/05/2018   Cancer ([place])    left ductal papilloma   Chest discomfort 09/12/2017   Negative stress test in May 2019   Chest pain 12/20/2019   CHF (congestive heart failure) ([facility])    Chronic kidney disease (CKD), stage III (moderate) 10/30/2017   Deficiency anemia 12/06/2017   Depression 44/62/8638   Diastolic congestive heart failure, NYHA class 3 ([place]) 10/27/2017   Diffuse pain 01/04/2018   Dysfunctional uterine bleeding 09/12/2017   Dyspnea    still having this and not moving around alot -gets worse with exertion   Endometrial cancer ([place]) 10/19/2017   Essential hypertension 10/27/2017   History of sleep walking    Hx of left breast biopsy    Hyperlipemia    Hypertension    not on any medication now-[facility] had  prescribed her a med and she stopped taking them cannot remember when   Hypertensive crisis 1/77/1165   Illicit drug use 7/90/3833   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   Left knee pain    Mild renal insufficiency 09/12/2017   Morbid obesity ([place]) 10/09/2017   Other constipation  11/28/2017   OTITIS MEDIA 12/04/2008   Qualifier: Diagnosis of  By: Tomma Lightning MD, Claiborne Billings     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   PMB (postmenopausal bleeding) 06/17/2015   Prediabetes 04/02/2018   Preventative health care 11/07/2018   Renal disorder    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   Vitamin D deficiency 02/15/2018    Past Surgical History:  Procedure Laterality Date   BREAST BIOPSY     left breast   CESAREAN SECTION     one   CHOLECYSTECTOMY     DILATION AND CURETTAGE OF UTERUS     approx 1 year ago   IR IMAGING GUIDED PORT INSERTION  12/06/2017   IR REMOVAL TUN ACCESS W/ PORT W/O FL MOD SED  06/19/2018   LYMPH NODE BIOPSY N/A 11/14/2017   Procedure: SENTINEL LYMPH NODE BIOPSY;  Surgeon: Everitt Amber, MD;  Location: [facility];  Service: Gynecology;  Laterality: N/A;   ROBOTIC ASSISTED TOTAL HYSTERECTOMY WITH BILATERAL SALPINGO OOPHERECTOMY N/A 11/14/2017   Procedure: XI ROBOTIC ASSISTED TOTAL HYSTERECTOMY WITH BILATERAL SALPINGO OOPHORECTOMY, RIGHT PELVIC LYMPHADENECTOMY;  Surgeon: Everitt Amber, MD;  Location: [facility];  Service: Gynecology;  Laterality: N/A;    Family History  Problem Relation Age of Onset   Anuerysm Mother    Congestive Heart Failure Mother    Hypertension Father    Hypertension Sister    Hypertension Brother  Colon cancer Neg Hx     Social History   Tobacco Use   Smoking status: Former    Packs/day: 0.30    Years: 26.00    Pack years: 7.80    Types: Cigarettes   Smokeless tobacco: Never   Tobacco comments:    quit May 2019  Vaping Use   Vaping Use: Never used  Substance Use Topics   Alcohol use: Not Currently    Comment: social drinking   Drug use: Yes    Types: Cocaine, "Crack" cocaine     Home Medications Prior to Admission medications   Medication Sig Start Date End Date Taking? Authorizing Provider  amLODipine (NORVASC) 10 MG tablet Take 1 tablet (10 mg total) by mouth daily. 11/24/20 11/24/21   Charlott Rakes, MD  atorvastatin (LIPITOR) 40 MG tablet Take 0.5 tablets (20 mg total) by mouth daily. 11/24/20   Charlott Rakes, MD  buPROPion (WELLBUTRIN XL) 150 MG 24 hr tablet Take 1 tablet (150 mg total) by mouth daily. For smoking cessation 11/24/20   Charlott Rakes, MD  cloNIDine (CATAPRES) 0.2 MG tablet TAKE 1 TABLET (0.2 MG TOTAL) BY MOUTH AT BEDTIME. Patient taking differently: Take 0.2 mg by mouth daily. 06/16/20 06/16/21  Park Liter, MD  furosemide (LASIX) 40 MG tablet Take 1 tablet (40 mg total) by mouth 2 (two) times daily. 11/24/20   Charlott Rakes, MD  hydrALAZINE (APRESOLINE) 50 MG tablet Take 1.5 tablets (75 mg total) by mouth 3 (three) times daily. 12/21/19   Andrew Au, MD  pantoprazole (PROTONIX) 40 MG tablet TAKE 1 TABLET (40 MG TOTAL) BY MOUTH DAILY. 11/20/20 11/20/21  Charlott Rakes, MD  prochlorperazine (COMPAZINE) 10 MG tablet Take 1 tablet (10 mg total) by mouth every 6 (six) hours as needed (Nausea or vomiting). Patient not taking: Reported on 05/25/2018 12/08/17 05/29/18  Heath Lark, MD     Allergies    Vicodin [hydrocodone-acetaminophen]   Review of Systems   Review of Systems A comprehensive review of systems was completed and negative except as noted in HPI.    Physical Exam BP 130/85   Pulse 65   Temp 97.6 F (36.4 C) (Oral)   Resp 19   Ht 5\' 4"  (1.626 m)   Wt 110.2 kg   LMP  (LMP Unknown) Comment: vaginal bleeding continuously for last 8 months  SpO2 98%   BMI 41.71 kg/m   Physical Exam Vitals and nursing note reviewed.  Constitutional:      Appearance: Normal appearance.  HENT:     Head: Normocephalic and atraumatic.     Nose: Nose normal.     Mouth/Throat:     Mouth: Mucous membranes are moist.  Eyes:     Extraocular Movements: Extraocular movements intact.     Conjunctiva/sclera: Conjunctivae normal.  Cardiovascular:     Rate and Rhythm: Normal rate.  Pulmonary:     Effort: Pulmonary effort is normal.     Breath sounds:  Normal breath sounds.  Chest:     Chest wall: Tenderness (L chest wall, reproduces pain) present.  Abdominal:     General: Abdomen is flat.     Palpations: Abdomen is soft.     Tenderness: There is no abdominal tenderness.  Musculoskeletal:        General: No swelling. Normal range of motion.     Cervical back: Neck supple.  Skin:    General: Skin is warm and dry.  Neurological:     General: No focal deficit present.  Mental Status: She is alert.  Psychiatric:        Mood and Affect: Mood normal.     ED Results / Procedures / Treatments   Labs (all labs ordered are listed, but only abnormal results are displayed) Labs Reviewed  CBC WITH DIFFERENTIAL/PLATELET - Abnormal; Notable for the following components:      Result Value   RDW 16.0 (*)    All other components within normal limits  BASIC METABOLIC PANEL - Abnormal; Notable for the following components:   Glucose, Bld 119 (*)    BUN 29 (*)    Creatinine, Ser 2.03 (*)    Calcium 8.8 (*)    GFR, Estimated 28 (*)    All other components within normal limits  TROPONIN I (HIGH SENSITIVITY) - Abnormal; Notable for the following components:   Troponin I (High Sensitivity) 20 (*)    All other components within normal limits  TROPONIN I (HIGH SENSITIVITY) - Abnormal; Notable for the following components:   Troponin I (High Sensitivity) 20 (*)    All other components within normal limits  BRAIN NATRIURETIC PEPTIDE    EKG EKG Interpretation  Date/Time:  Saturday January 02 2021 17:24:38 EDT Ventricular Rate:  65 PR Interval:  132 QRS Duration: 84 QT Interval:  469 QTC Calculation: 488 R Axis:   -19 Text Interpretation: Sinus rhythm Right atrial enlargement LVH with secondary repolarization abnormality Borderline prolonged QT interval No significant change since last tracing Confirmed by Calvert Cantor [phone]) on 01/02/2021 5:39:40 PM  Radiology DG Chest Port 1 View  Result Date: 01/02/2021 CLINICAL DATA:  Chest pain  and shortness of breath. EXAM: PORTABLE CHEST 1 VIEW COMPARISON:  12/19/2019 and older studies. FINDINGS: Mild enlargement of the cardiopericardial silhouette, stable from the prior study. No mediastinal or hilar masses. Lungs are clear. No convincing pleural effusion and no pneumothorax. Skeletal structures are grossly intact. IMPRESSION: 1. No acute cardiopulmonary disease. 2. Stable cardiomegaly. Electronically Signed   By: Lajean Manes M.D.   On: 01/02/2021 17:45    Procedures Procedures  Medications Ordered in the ED Medications - No data to display   MDM Rules/Calculators/A&P MDM Patient with reproducible chest pain, history of CHF does not appear to be in acute pulmonary edema. Check labs and CXR.   ED Course  I have reviewed the triage vital signs and the nursing notes.  Pertinent labs & imaging results that were available during my care of the patient were reviewed by me and considered in my medical decision making (see chart for details).  Clinical Course as of 01/02/21 2207  Sat Jan 02, 2021  1804 CBC is normal.  [CS]  4034 CXR without signs of CHF.  [CS]  1913 BNP is normal.  [CS]  1957 BMP shows creatinine has slightly worsened from baseline. Initial trop is 20.  [CS]  2155 Second Trop is flat.  [CS]  2202 Discussed results with patient and daughter at bedside. She has been taking Lasix TID which may account for her worsening kidney function. Given no signs of fluid overload today, advised to cut that back to BID for the next three days and follow up with PCP for recheck next week. She does not have signs of ongoing cardiac ischemia, pain has been off and on for two weeks and is reproducible. No indication for admission to the hospital.  [CS]    Clinical Course User Index [CS] Truddie Hidden, MD    Final Clinical Impression(s) / ED Diagnoses Final diagnoses:  Chest wall pain  Chronic kidney disease, unspecified CKD stage    Rx / DC Orders ED Discharge Orders      None        Truddie Hidden, MD 01/02/21 2207

## 2021-01-07 ENCOUNTER — Emergency Department (HOSPITAL_COMMUNITY): Payer: Medicare Other

## 2021-01-07 ENCOUNTER — Encounter (HOSPITAL_COMMUNITY): Payer: Self-pay

## 2021-01-07 ENCOUNTER — Encounter (HOSPITAL_COMMUNITY): Payer: Self-pay | Admitting: Emergency Medicine

## 2021-01-07 ENCOUNTER — Inpatient Hospital Stay (HOSPITAL_COMMUNITY)
Admission: EM | Admit: 2021-01-07 | Discharge: 2021-01-11 | DRG: 177 | Disposition: A | Discharge status: Home Health | Payer: Medicare Other | Source: Ambulatory Visit | Attending: Internal Medicine | Admitting: Internal Medicine

## 2021-01-07 ENCOUNTER — Other Ambulatory Visit: Payer: Self-pay

## 2021-01-07 ENCOUNTER — Emergency Department (HOSPITAL_COMMUNITY)
Admission: EM | Admit: 2021-01-07 | Discharge: 2021-01-07 | Disposition: A | Discharge status: Home / Self Care | Payer: Medicare Other | Source: Home / Self Care

## 2021-01-07 DIAGNOSIS — Z20822 Contact with and (suspected) exposure to covid-19: Secondary | ICD-10-CM | POA: Diagnosis not present

## 2021-01-07 DIAGNOSIS — E785 Hyperlipidemia, unspecified: Secondary | ICD-10-CM | POA: Diagnosis not present

## 2021-01-07 DIAGNOSIS — Z28310 Unvaccinated for covid-19: Secondary | ICD-10-CM

## 2021-01-07 DIAGNOSIS — J1282 Pneumonia due to coronavirus disease 2019: Secondary | ICD-10-CM | POA: Diagnosis not present

## 2021-01-07 DIAGNOSIS — T451X5A Adverse effect of antineoplastic and immunosuppressive drugs, initial encounter: Secondary | ICD-10-CM | POA: Diagnosis not present

## 2021-01-07 DIAGNOSIS — N183 Chronic kidney disease, stage 3 unspecified: Secondary | ICD-10-CM | POA: Diagnosis present

## 2021-01-07 DIAGNOSIS — Z5321 Procedure and treatment not carried out due to patient leaving prior to being seen by health care provider: Secondary | ICD-10-CM | POA: Insufficient documentation

## 2021-01-07 DIAGNOSIS — Z6841 Body Mass Index (BMI) 40.0 and over, adult: Secondary | ICD-10-CM

## 2021-01-07 DIAGNOSIS — Z79899 Other long term (current) drug therapy: Secondary | ICD-10-CM

## 2021-01-07 DIAGNOSIS — Z87891 Personal history of nicotine dependence: Secondary | ICD-10-CM

## 2021-01-07 DIAGNOSIS — F1410 Cocaine abuse, uncomplicated: Secondary | ICD-10-CM | POA: Diagnosis present

## 2021-01-07 DIAGNOSIS — M791 Myalgia, unspecified site: Secondary | ICD-10-CM | POA: Insufficient documentation

## 2021-01-07 DIAGNOSIS — Z8542 Personal history of malignant neoplasm of other parts of uterus: Secondary | ICD-10-CM | POA: Diagnosis not present

## 2021-01-07 DIAGNOSIS — Z8249 Family history of ischemic heart disease and other diseases of the circulatory system: Secondary | ICD-10-CM | POA: Diagnosis not present

## 2021-01-07 DIAGNOSIS — I5032 Chronic diastolic (congestive) heart failure: Secondary | ICD-10-CM | POA: Diagnosis present

## 2021-01-07 DIAGNOSIS — U071 COVID-19: Secondary | ICD-10-CM | POA: Insufficient documentation

## 2021-01-07 DIAGNOSIS — F32A Depression, unspecified: Secondary | ICD-10-CM | POA: Diagnosis present

## 2021-01-07 DIAGNOSIS — J9601 Acute respiratory failure with hypoxia: Secondary | ICD-10-CM | POA: Diagnosis present

## 2021-01-07 DIAGNOSIS — G620 Drug-induced polyneuropathy: Secondary | ICD-10-CM | POA: Diagnosis present

## 2021-01-07 DIAGNOSIS — N1832 Chronic kidney disease, stage 3b: Secondary | ICD-10-CM

## 2021-01-07 DIAGNOSIS — I130 Hypertensive heart and chronic kidney disease with heart failure and stage 1 through stage 4 chronic kidney disease, or unspecified chronic kidney disease: Secondary | ICD-10-CM | POA: Diagnosis not present

## 2021-01-07 DIAGNOSIS — R079 Chest pain, unspecified: Secondary | ICD-10-CM | POA: Diagnosis not present

## 2021-01-07 DIAGNOSIS — I509 Heart failure, unspecified: Secondary | ICD-10-CM | POA: Insufficient documentation

## 2021-01-07 DIAGNOSIS — I5030 Unspecified diastolic (congestive) heart failure: Secondary | ICD-10-CM | POA: Diagnosis present

## 2021-01-07 DIAGNOSIS — N1831 Chronic kidney disease, stage 3a: Secondary | ICD-10-CM | POA: Diagnosis present

## 2021-01-07 DIAGNOSIS — I10 Essential (primary) hypertension: Secondary | ICD-10-CM | POA: Diagnosis not present

## 2021-01-07 DIAGNOSIS — Z885 Allergy status to narcotic agent status: Secondary | ICD-10-CM | POA: Diagnosis not present

## 2021-01-07 DIAGNOSIS — R0902 Hypoxemia: Secondary | ICD-10-CM

## 2021-01-07 DIAGNOSIS — R0602 Shortness of breath: Secondary | ICD-10-CM | POA: Diagnosis not present

## 2021-01-07 LAB — CBC WITH DIFFERENTIAL/PLATELET
Abs Immature Granulocytes: 0.03 10*3/uL (ref 0.00–0.07)
Basophils Absolute: 0 10*3/uL (ref 0.0–0.1)
Basophils Relative: 0 %
Eosinophils Absolute: 0.1 10*3/uL (ref 0.0–0.5)
Eosinophils Relative: 2 %
HCT: 45.7 % (ref 36.0–46.0)
Hemoglobin: 14.5 g/dL (ref 12.0–15.0)
Immature Granulocytes: 1 %
Lymphocytes Relative: 31 %
Lymphs Abs: 1.4 10*3/uL (ref 0.7–4.0)
MCH: 29.3 pg (ref 26.0–34.0)
MCHC: 31.7 g/dL (ref 30.0–36.0)
MCV: 92.3 fL (ref 80.0–100.0)
Monocytes Absolute: 0.5 10*3/uL (ref 0.1–1.0)
Monocytes Relative: 12 %
Neutro Abs: 2.5 10*3/uL (ref 1.7–7.7)
Neutrophils Relative %: 54 %
Platelets: 219 10*3/uL (ref 150–400)
RBC: 4.95 MIL/uL (ref 3.87–5.11)
RDW: 15.9 % — ABNORMAL HIGH (ref 11.5–15.5)
WBC: 4.5 10*3/uL (ref 4.0–10.5)
nRBC: 0 % (ref 0.0–0.2)

## 2021-01-07 LAB — I-STAT BETA HCG BLOOD, ED (MC, WL, AP ONLY): I-stat hCG, quantitative: 5.8 m[IU]/mL — ABNORMAL HIGH (ref <5)

## 2021-01-07 LAB — BASIC METABOLIC PANEL
Anion gap: 8 (ref 5–15)
BUN: 22 mg/dL — ABNORMAL HIGH (ref 6–20)
CO2: 26 mmol/L (ref 22–32)
Calcium: 9.2 mg/dL (ref 8.9–10.3)
Chloride: 107 mmol/L (ref 98–111)
Creatinine, Ser: 1.68 mg/dL — ABNORMAL HIGH (ref 0.44–1.00)
GFR, Estimated: 35 mL/min — ABNORMAL LOW (ref >60)
Glucose, Bld: 100 mg/dL — ABNORMAL HIGH (ref 70–99)
Potassium: 3.7 mmol/L (ref 3.5–5.1)
Sodium: 141 mmol/L (ref 135–145)

## 2021-01-07 LAB — COMPREHENSIVE METABOLIC PANEL
ALT: 16 U/L (ref 0–44)
AST: 17 U/L (ref 15–41)
Albumin: 4.1 g/dL (ref 3.5–5.0)
Alkaline Phosphatase: 103 U/L (ref 38–126)
Anion gap: 10 (ref 5–15)
BUN: 24 mg/dL — ABNORMAL HIGH (ref 6–20)
CO2: 25 mmol/L (ref 22–32)
Calcium: 9.2 mg/dL (ref 8.9–10.3)
Chloride: 107 mmol/L (ref 98–111)
Creatinine, Ser: 1.56 mg/dL — ABNORMAL HIGH (ref 0.44–1.00)
GFR, Estimated: 38 mL/min — ABNORMAL LOW (ref >60)
Glucose, Bld: 91 mg/dL (ref 70–99)
Potassium: 4 mmol/L (ref 3.5–5.1)
Sodium: 142 mmol/L (ref 135–145)
Total Bilirubin: 0.7 mg/dL (ref 0.3–1.2)
Total Protein: 7.7 g/dL (ref 6.5–8.1)

## 2021-01-07 LAB — FIBRINOGEN: Fibrinogen: 613 mg/dL — ABNORMAL HIGH (ref 210–475)

## 2021-01-07 LAB — CBC
HCT: 45.6 % (ref 36.0–46.0)
Hemoglobin: 14.2 g/dL (ref 12.0–15.0)
MCH: 28.7 pg (ref 26.0–34.0)
MCHC: 31.1 g/dL (ref 30.0–36.0)
MCV: 92.3 fL (ref 80.0–100.0)
Platelets: 246 10*3/uL (ref 150–400)
RBC: 4.94 MIL/uL (ref 3.87–5.11)
RDW: 15.8 % — ABNORMAL HIGH (ref 11.5–15.5)
WBC: 4.6 10*3/uL (ref 4.0–10.5)
nRBC: 0 % (ref 0.0–0.2)

## 2021-01-07 LAB — LACTIC ACID, PLASMA: Lactic Acid, Venous: 1.2 mmol/L (ref 0.5–1.9)

## 2021-01-07 LAB — D-DIMER, QUANTITATIVE: D-Dimer, Quant: <0.27 ug/mL-FEU (ref 0.00–0.50)

## 2021-01-07 LAB — TRIGLYCERIDES: Triglycerides: 88 mg/dL (ref <150)

## 2021-01-07 LAB — LACTATE DEHYDROGENASE: LDH: 179 U/L (ref 98–192)

## 2021-01-07 LAB — BRAIN NATRIURETIC PEPTIDE: B Natriuretic Peptide: 20.8 pg/mL (ref 0.0–100.0)

## 2021-01-07 LAB — C-REACTIVE PROTEIN: CRP: 0.9 mg/dL (ref <1.0)

## 2021-01-07 LAB — FERRITIN: Ferritin: 104 ng/mL (ref 11–307)

## 2021-01-07 LAB — TROPONIN I (HIGH SENSITIVITY)
Troponin I (High Sensitivity): 19 ng/L — ABNORMAL HIGH (ref <18)
Troponin I (High Sensitivity): 17 ng/L (ref <18)
Troponin I (High Sensitivity): 16 ng/L (ref <18)

## 2021-01-07 MED ORDER — SODIUM CHLORIDE 0.9 % IV SOLN: 200.0000 mg | Freq: Once | INTRAVENOUS | Status: DC

## 2021-01-07 MED ORDER — SODIUM CHLORIDE 0.9 % IV SOLN: 100.0000 mg | Freq: Every day | INTRAVENOUS | Status: DC

## 2021-01-07 MED ORDER — SODIUM CHLORIDE 0.9 % IV SOLN
100.0000 mg | Freq: Every day | INTRAVENOUS | Status: AC
  Administered 2021-01-08 – 2021-01-11 (×4): 100 mg via INTRAVENOUS
  Filled 2021-01-07 (×4): qty 20

## 2021-01-07 MED ORDER — FUROSEMIDE 40 MG PO TABS
40.0000 mg | ORAL_TABLET | Freq: Two times a day (BID) | ORAL | Status: DC
  Administered 2021-01-08 – 2021-01-11 (×7): 40 mg via ORAL
  Filled 2021-01-07 (×7): qty 1

## 2021-01-07 MED ORDER — PANTOPRAZOLE SODIUM 40 MG PO TBEC
40.0000 mg | DELAYED_RELEASE_TABLET | Freq: Every day | ORAL | Status: DC
  Administered 2021-01-08 – 2021-01-11 (×4): 40 mg via ORAL
  Filled 2021-01-07 (×4): qty 1

## 2021-01-07 MED ORDER — BUPROPION HCL ER (XL) 150 MG PO TB24
150.0000 mg | ORAL_TABLET | Freq: Every day | ORAL | Status: DC
  Administered 2021-01-07 – 2021-01-10 (×4): 150 mg via ORAL
  Filled 2021-01-07 (×4): qty 1

## 2021-01-07 MED ORDER — HYDRALAZINE HCL 50 MG PO TABS
75.0000 mg | ORAL_TABLET | Freq: Three times a day (TID) | ORAL | Status: DC
  Administered 2021-01-07 – 2021-01-11 (×11): 75 mg via ORAL
  Filled 2021-01-07 (×4): qty 1
  Filled 2021-01-07 (×3): qty 3
  Filled 2021-01-07 (×4): qty 1

## 2021-01-07 MED ORDER — ENOXAPARIN SODIUM 60 MG/0.6ML IJ SOSY
55.0000 mg | PREFILLED_SYRINGE | INTRAMUSCULAR | Status: DC
  Administered 2021-01-08 – 2021-01-11 (×4): 55 mg via SUBCUTANEOUS
  Filled 2021-01-07: qty 0.55
  Filled 2021-01-07 (×3): qty 0.6

## 2021-01-07 MED ORDER — CLONIDINE HCL 0.2 MG PO TABS
0.2000 mg | ORAL_TABLET | Freq: Every day | ORAL | Status: DC
  Administered 2021-01-07 – 2021-01-10 (×4): 0.2 mg via ORAL
  Filled 2021-01-07 (×3): qty 1
  Filled 2021-01-07: qty 2

## 2021-01-07 MED ORDER — DEXAMETHASONE SODIUM PHOSPHATE 10 MG/ML IJ SOLN
6.0000 mg | Freq: Once | INTRAMUSCULAR | Status: AC
  Administered 2021-01-07: 6 mg via INTRAVENOUS
  Filled 2021-01-07: qty 1

## 2021-01-07 MED ORDER — SODIUM CHLORIDE 0.9 % IV SOLN
100.0000 mg | INTRAVENOUS | Status: AC
  Administered 2021-01-07 – 2021-01-08 (×2): 100 mg via INTRAVENOUS
  Filled 2021-01-07 (×2): qty 20

## 2021-01-07 MED ORDER — ONDANSETRON HCL 4 MG PO TABS: 4.0000 mg | ORAL_TABLET | Freq: Four times a day (QID) | ORAL | Status: DC | PRN

## 2021-01-07 MED ORDER — DIPHENHYDRAMINE HCL 25 MG PO CAPS
25.0000 mg | ORAL_CAPSULE | Freq: Four times a day (QID) | ORAL | Status: DC | PRN
  Administered 2021-01-09: 25 mg via ORAL
  Filled 2021-01-07: qty 1

## 2021-01-07 MED ORDER — METHYLPREDNISOLONE SODIUM SUCC 125 MG IJ SOLR
0.5000 mg/kg | Freq: Two times a day (BID) | INTRAMUSCULAR | Status: AC
  Administered 2021-01-08 – 2021-01-10 (×6): 55 mg via INTRAVENOUS
  Filled 2021-01-07 (×6): qty 2

## 2021-01-07 MED ORDER — ATORVASTATIN CALCIUM 20 MG PO TABS
20.0000 mg | ORAL_TABLET | Freq: Every day | ORAL | Status: DC
  Administered 2021-01-07 – 2021-01-10 (×4): 20 mg via ORAL
  Filled 2021-01-07 (×3): qty 1
  Filled 2021-01-07: qty 2

## 2021-01-07 MED ORDER — ONDANSETRON HCL 4 MG/2ML IJ SOLN: 4.0000 mg | Freq: Four times a day (QID) | INTRAMUSCULAR | Status: DC | PRN

## 2021-01-07 MED ORDER — ACETAMINOPHEN 325 MG PO TABS
650.0000 mg | ORAL_TABLET | Freq: Four times a day (QID) | ORAL | Status: DC | PRN
  Administered 2021-01-09 (×2): 650 mg via ORAL
  Filled 2021-01-07 (×2): qty 2

## 2021-01-07 MED ORDER — AMLODIPINE BESYLATE 10 MG PO TABS
10.0000 mg | ORAL_TABLET | Freq: Every day | ORAL | Status: DC
  Administered 2021-01-07 – 2021-01-10 (×4): 10 mg via ORAL
  Filled 2021-01-07 (×3): qty 1
  Filled 2021-01-07: qty 2

## 2021-01-07 MED ORDER — PREDNISONE 50 MG PO TABS
50.0000 mg | ORAL_TABLET | Freq: Every day | ORAL | Status: DC
  Administered 2021-01-11: 50 mg via ORAL
  Filled 2021-01-07: qty 1

## 2021-01-07 NOTE — H&P (Signed)
History and Physical    Angela Gardner JTT:[id number] DOB: [birth year] DOA: 01/07/2021  PCP: Charlott Rakes, MD  Patient coming from: Home  I have personally briefly reviewed patient's old medical records in [place]  Chief Complaint: SOB  HPI: Angela Gardner is a 60 y.o. female with medical history significant of dCHF, ongoing cocaine abuse, HTN, CKD 3.  Pt unvaccinated to [place].  Pt presents to the ED with c/o SOB, symptoms onset 5 days ago, progressively worsening.  Nothing makes symptoms better or worse.  Associated dry cough.  Pt tested positive for [facility]-19 [facility] (visible in care everywhere).  No fever, no rash, no sore throat, no vomiting.   ED Course: Pt satting 88% on RA in ED, improved to upper 90s on 2L via [place].  CXR neg.  Positive COVID test visible in care-everywhere.  Pt given decadron, hospitalist asked to admit.   Review of Systems: As per HPI, otherwise all review of systems negative.  Past Medical History:  Diagnosis Date   Acute bronchitis 12/04/2008   Qualifier: Diagnosis of  By: Tomma Lightning MD, Claiborne Billings     Acute CHF (congestive heart failure) ([place]) 09/12/2017   Acute exacerbation of CHF (congestive heart failure) ([place]) 09/13/2017   Anemia    Antineoplastic chemotherapy induced anemia 04/05/2018   Cancer ([place])    left ductal papilloma   Chest discomfort 09/12/2017   Negative stress test in May 2019   Chest pain 12/20/2019   CHF (congestive heart failure) (HCC)    Chronic kidney disease (CKD), stage III (moderate) 10/30/2017   Deficiency anemia 12/06/2017   Depression 00/92/3300   Diastolic congestive heart failure, NYHA class 3 ([place]) 10/27/2017   Diffuse pain 01/04/2018   Dysfunctional uterine bleeding 09/12/2017   Dyspnea    still having this and not moving around alot -gets worse with exertion   Endometrial cancer ([place]) 10/19/2017   Essential hypertension 10/27/2017   History of sleep walking    Hx of left breast biopsy    Hyperlipemia     Hypertension    not on any medication now-[facility] had  prescribed her a med and she stopped taking them cannot remember when   Hypertensive crisis 7/62/2633   Illicit drug use 3/54/5625   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   Left knee pain    Mild renal insufficiency 09/12/2017   Morbid obesity ([place]) 10/09/2017   Other constipation 11/28/2017   OTITIS MEDIA 12/04/2008   Qualifier: Diagnosis of  By: Tomma Lightning MD, Claiborne Billings     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   PMB (postmenopausal bleeding) 06/17/2015   Prediabetes 04/02/2018   Preventative health care 11/07/2018   Renal disorder    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   Vitamin D deficiency 02/15/2018    Past Surgical History:  Procedure Laterality Date   BREAST BIOPSY     left breast   CESAREAN SECTION     one   CHOLECYSTECTOMY     DILATION AND CURETTAGE OF UTERUS     approx 1 year ago   IR IMAGING GUIDED PORT INSERTION  12/06/2017   IR REMOVAL TUN ACCESS W/ PORT W/O FL MOD SED  06/19/2018   LYMPH NODE BIOPSY N/A 11/14/2017   Procedure: SENTINEL LYMPH NODE BIOPSY;  Surgeon: Everitt Amber, MD;  Location: [facility];  Service: Gynecology;  Laterality: N/A;   ROBOTIC ASSISTED TOTAL HYSTERECTOMY WITH BILATERAL SALPINGO OOPHERECTOMY N/A 11/14/2017   Procedure: XI ROBOTIC ASSISTED TOTAL  HYSTERECTOMY WITH BILATERAL SALPINGO OOPHORECTOMY, RIGHT PELVIC LYMPHADENECTOMY;  Surgeon: Everitt Amber, MD;  Location: [facility];  Service: Gynecology;  Laterality: N/A;     reports that she has quit smoking. Her smoking use included cigarettes. She has a 7.80 pack-year smoking history. She has never used smokeless tobacco. She reports previous alcohol use. She reports current drug use. Drugs: Cocaine and "Crack" cocaine.  Allergies  Allergen Reactions   Vicodin [Hydrocodone-Acetaminophen] Itching    Family History  Problem Relation Age of Onset   Anuerysm Mother    Congestive Heart Failure Mother     Hypertension Father    Hypertension Sister    Hypertension Brother    Colon cancer Neg Hx      Prior to Admission medications   Medication Sig Start Date End Date Taking? Authorizing Provider  acetaminophen (TYLENOL) 160 MG/5ML liquid Take 325 mg by mouth every 4 (four) hours as needed for fever.   Yes [provider]  amLODipine (NORVASC) 10 MG tablet Take 1 tablet (10 mg total) by mouth daily. 11/24/20 11/24/21 Yes Charlott Rakes, MD  atorvastatin (LIPITOR) 40 MG tablet Take 0.5 tablets (20 mg total) by mouth daily. 11/24/20  Yes Charlott Rakes, MD  buPROPion (WELLBUTRIN XL) 150 MG 24 hr tablet Take 1 tablet (150 mg total) by mouth daily. For smoking cessation 11/24/20  Yes Newlin, Enobong, MD  cloNIDine (CATAPRES) 0.2 MG tablet TAKE 1 TABLET (0.2 MG TOTAL) BY MOUTH AT BEDTIME. Patient taking differently: Take 0.2 mg by mouth daily. 06/16/20 06/16/21 Yes Park Liter, MD  diphenhydrAMINE (BENADRYL) 25 mg capsule Take 25 mg by mouth every 6 (six) hours as needed for itching.   Yes [provider]  furosemide (LASIX) 40 MG tablet Take 1 tablet (40 mg total) by mouth 2 (two) times daily. 11/24/20  Yes Charlott Rakes, MD  hydrALAZINE (APRESOLINE) 50 MG tablet Take 1.5 tablets (75 mg total) by mouth 3 (three) times daily. 12/21/19  Yes Andrew Au, MD  OVER THE COUNTER MEDICATION Take 1 capsule by mouth daily. Beet root gummie   Yes [provider]  pantoprazole (PROTONIX) 40 MG tablet TAKE 1 TABLET (40 MG TOTAL) BY MOUTH DAILY. Patient taking differently: Take 40 mg by mouth daily. 11/20/20 11/20/21 Yes Charlott Rakes, MD  prochlorperazine (COMPAZINE) 10 MG tablet Take 1 tablet (10 mg total) by mouth every 6 (six) hours as needed (Nausea or vomiting). Patient not taking: Reported on 05/25/2018 12/08/17 05/29/18  Heath Lark, MD    Physical Exam: Vitals:   01/07/21 1830 01/07/21 2045 01/07/21 2049 01/07/21 2115  BP:  (!) 192/97 (!) 177/115   Pulse: 73  71 69  Resp:    18 (!) 28  Temp:      TempSrc:      SpO2: 100%  100% 98%    Constitutional: NAD, calm, comfortable Eyes: PERRL, lids and conjunctivae normal ENMT: Mucous membranes are moist. Posterior pharynx clear of any exudate or lesions.Normal dentition.  Neck: normal, supple, no masses, no thyromegaly Respiratory: Minimal scatter wheezes Cardiovascular: Regular rate and rhythm, no murmurs / rubs / gallops. No extremity edema. 2+ pedal pulses. No carotid bruits.  Abdomen: no tenderness, no masses palpated. No hepatosplenomegaly. Bowel sounds positive.  Musculoskeletal: no clubbing / cyanosis. No joint deformity upper and lower extremities. Good ROM, no contractures. Normal muscle tone.  Skin: no rashes, lesions, ulcers. No induration Neurologic: CN 2-12 grossly intact. Sensation intact, DTR normal. Strength 5/5 in all 4.  Psychiatric: Normal judgment and  insight. Alert and oriented x 3. Normal mood.    Labs on Admission: I have personally reviewed following labs and imaging studies  CBC: Recent Labs  Lab 01/02/21 1740 01/07/21 1224 01/07/21 2055  WBC 7.0 4.6 4.5  NEUTROABS 5.1  --  2.5  HGB 14.6 14.2 14.5  HCT 44.4 45.6 45.7  MCV 90.4 92.3 92.3  PLT 282 246 616   Basic Metabolic Panel: Recent Labs  Lab 01/02/21 1845 01/07/21 1224 01/07/21 2055  NA 140 141 142  K 3.9 3.7 4.0  CL 106 107 107  CO2 25 26 25   GLUCOSE 119* 100* 91  BUN 29* 22* 24*  CREATININE 2.03* 1.68* 1.56*  CALCIUM 8.8* 9.2 9.2   GFR: Estimated Creatinine Clearance: 47.7 mL/min (A) (by C-G formula based on SCr of 1.56 mg/dL (H)). Liver Function Tests: Recent Labs  Lab 01/07/21 2055  AST 17  ALT 16  ALKPHOS 103  BILITOT 0.7  PROT 7.7  ALBUMIN 4.1   No results for input(s): LIPASE, AMYLASE in the last 168 hours. No results for input(s): AMMONIA in the last 168 hours. Coagulation Profile: No results for input(s): INR, PROTIME in the last 168 hours. Cardiac Enzymes: No results for input(s): CKTOTAL,  CKMB, CKMBINDEX, TROPONINI in the last 168 hours. BNP (last 3 results) No results for input(s): PROBNP in the last 8760 hours. HbA1C: No results for input(s): HGBA1C in the last 72 hours. CBG: No results for input(s): GLUCAP in the last 168 hours. Lipid Profile: Recent Labs    01/07/21 2055  TRIG 88   Thyroid Function Tests: No results for input(s): TSH, T4TOTAL, FREET4, T3FREE, THYROIDAB in the last 72 hours. Anemia Panel: Recent Labs    01/07/21 2055  FERRITIN 104   Urine analysis:    Component Value Date/Time   COLORURINE YELLOW 12/22/2017 0808   APPEARANCEUR CLEAR 12/22/2017 0808   LABSPEC 1.014 12/22/2017 0808   PHURINE 5.0 12/22/2017 0808   GLUCOSEU NEGATIVE 12/22/2017 0808   HGBUR NEGATIVE 12/22/2017 0808   BILIRUBINUR NEGATIVE 12/22/2017 0808   KETONESUR NEGATIVE 12/22/2017 0808   PROTEINUR 30 (A) 12/22/2017 0808   UROBILINOGEN 0.2 12/02/2011 1924   NITRITE NEGATIVE 12/22/2017 0808   LEUKOCYTESUR TRACE (A) 12/22/2017 0808    Radiological Exams on Admission: DG Chest 2 View  Result Date: 01/07/2021 CLINICAL DATA:  Shortness of breath and COVID-19 positivity EXAM: CHEST - 2 VIEW COMPARISON:  01/02/2021 FINDINGS: The heart size and mediastinal contours are within normal limits. Both lungs are clear. The visualized skeletal structures are unremarkable. IMPRESSION: No active cardiopulmonary disease. Electronically Signed   By: Inez Catalina M.D.   On: 01/07/2021 13:14    EKG: Independently reviewed.  Assessment/Plan Principal Problem:   Acute hypoxemic respiratory failure due to COVID-19 [facility]) Active Problems:   Essential hypertension   Diastolic congestive heart failure, NYHA class 3 (HCC)   Chronic kidney disease (CKD), stage III (moderate) (HCC)   Cocaine abuse (HCC)    Acute hypoxic resp failure due to COVID-19 - New 2L O2 requirement COVID pathway Remdesivir Steroids Daily labs Cont pulse ox Chronic diastolic CHF - Cont home lasix Cont home BP  meds Cocaine abuse - Ongoing, contributing to CHF and HTN HTN - Cont home BP meds CKD 3 - Chronic, stable, baseline Monitor daily labs with COVID  DVT prophylaxis: Lovenox Code Status: Full Family Communication: No family in room Disposition Plan: Home after O2 requirement improved Consults called: None Admission status: Admit to inpatient  Severity of Illness:  The appropriate patient status for this patient is INPATIENT. Inpatient status is judged to be reasonable and necessary in order to provide the required intensity of service to ensure the patient's safety. The patient's presenting symptoms, physical exam findings, and initial radiographic and laboratory data in the context of their chronic comorbidities is felt to place them at high risk for further clinical deterioration. Furthermore, it is not anticipated that the patient will be medically stable for discharge from the hospital within 2 midnights of admission. The following factors support the patient status of inpatient.   IP status due to new O2 requirement secondary to [id number]   * I certify that at the point of admission it is my clinical judgment that the patient will require inpatient hospital care spanning beyond 2 midnights from the point of admission due to high intensity of service, high risk for further deterioration and high frequency of surveillance required.*   Casy Brunetto M. DO [facility]  How to contact the [facility] Attending or Consulting provider [place] or covering provider during after hours [place], for this patient?  Check the care team in [facility] and look for a) attending/consulting TR[facility] provider listed and b) the [facility] team listed Log into [email or web address]  Amion Physician Scheduling and messaging for groups and whole hospitals  On call and physician scheduling software for group practices, residents, hospitalists and other medical providers for call, clinic, rotation and shift schedules. On[employer]  is a hospital-wide system for scheduling doctors and paging doctors on call. EasyPlot is for scientific plotting and data analysis.  [email or web address]  and use [place]'s universal password to access. If you do not have the password, please contact the hospital operator.  Locate the [facility] provider you are looking for under [facility] and page to a number that you can be directly reached. If you still have difficulty reaching the provider, please page the [facility] (Director on Call) for the Hospitalists listed on amion for assistance.  01/07/2021, 11:17 PM

## 2021-01-07 NOTE — ED Provider Notes (Signed)
[facility] DEPT Provider Note   CSN: [id number] Arrival date & time: 01/07/21  1731     History Chief Complaint  Patient presents with   Covid Positive    Angela Gardner is a 60 y.o. female.  The history is provided by the patient.  Shortness of Breath Severity:  Moderate Onset quality:  Gradual Duration:  5 days Timing:  Constant Progression:  Worsening Chronicity:  New Context comment:  Tested positive for COVID-19 today Relieved by:  Nothing Worsened by:  Nothing Ineffective treatments:  None tried Associated symptoms: chest pain and cough   Associated symptoms: no abdominal pain, no ear pain, no fever, no rash, no sore throat and no vomiting   Risk factors comment:  Unvaccinated for COVID-19     Past Medical History:  Diagnosis Date   Acute bronchitis 12/04/2008   Qualifier: Diagnosis of  By: Tomma Lightning MD, Claiborne Billings     Acute CHF (congestive heart failure) ([place]) 09/12/2017   Acute exacerbation of CHF (congestive heart failure) ([place]) 09/13/2017   Anemia    Antineoplastic chemotherapy induced anemia 04/05/2018   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   Chest discomfort 09/12/2017   Negative stress test in May 2019   Chest pain 12/20/2019   CHF (congestive heart failure) (HCC)    Chronic kidney disease (CKD), stage III (moderate) 10/30/2017   Deficiency anemia 12/06/2017   Depression 24/40/1027   Diastolic congestive heart failure, NYHA class 3 ([place]) 10/27/2017   Diffuse pain 01/04/2018   Dysfunctional uterine bleeding 09/12/2017   Dyspnea    still having this and not moving around alot -gets worse with exertion   Endometrial cancer ([place]) 10/19/2017   Essential hypertension 10/27/2017   History of sleep walking    Hx of left breast biopsy    Hyperlipemia    Hypertension    not on any medication now-[facility] had  prescribed her a med and she stopped taking them cannot remember when   Hypertensive crisis 2/53/6644   Illicit drug use  0/34/7425   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   Left knee pain    Mild renal insufficiency 09/12/2017   Morbid obesity ([place]) 10/09/2017   Other constipation 11/28/2017   OTITIS MEDIA 12/04/2008   Qualifier: Diagnosis of  By: Tomma Lightning MD, Claiborne Billings     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   PMB (postmenopausal bleeding) 06/17/2015   Prediabetes 04/02/2018   Preventative health care 11/07/2018   Renal disorder    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   Vitamin D deficiency 02/15/2018    Patient Active Problem List   Diagnosis Date Noted   Noncompliance 09/25/2020   CHF (congestive heart failure) (HCC)    Anemia    Cancer (HCC)    Dyspnea    History of sleep walking    Hx of left breast biopsy    Hyperlipemia    Hypertension    Renal disorder    Chest pain 12/20/2019   Left knee pain    Preventative health care 11/07/2018   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   Antineoplastic chemotherapy induced anemia 04/05/2018   Depression 04/02/2018   Prediabetes 04/02/2018   Vitamin D deficiency 02/15/2018   Diffuse pain 01/04/2018   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   Deficiency anemia 12/06/2017   Other constipation 95/63/8756   Illicit drug use 43/32/9518   Chronic kidney disease (CKD), stage III (moderate) 10/30/2017  Essential hypertension 51/76/1607   Diastolic congestive heart failure, NYHA class 3 ([place]) 10/27/2017   Endometrial cancer ([place]) 10/19/2017   Morbid obesity ([place]) 10/09/2017   Acute exacerbation of CHF (congestive heart failure) ([place]) 09/13/2017   Hypertensive crisis 09/12/2017   Mild renal insufficiency 09/12/2017   Dysfunctional uterine bleeding 09/12/2017   Acute CHF (congestive heart failure) ([place]) 09/12/2017   Chest discomfort 09/12/2017   PMB (postmenopausal bleeding) 06/17/2015   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   OTITIS MEDIA 12/04/2008    ACUTE BRONCHITIS 12/04/2008    Past Surgical History:  Procedure Laterality Date   BREAST BIOPSY     left breast   CESAREAN SECTION     one   CHOLECYSTECTOMY     DILATION AND CURETTAGE OF UTERUS     approx 1 year ago   IR IMAGING GUIDED PORT INSERTION  12/06/2017   IR REMOVAL TUN ACCESS W/ PORT W/O FL MOD SED  06/19/2018   LYMPH NODE BIOPSY N/A 11/14/2017   Procedure: SENTINEL LYMPH NODE BIOPSY;  Surgeon: Everitt Amber, MD;  Location[facility];  Service: Gynecology;  Laterality: N/A;   ROBOTIC ASSISTED TOTAL HYSTERECTOMY WITH BILATERAL SALPINGO OOPHERECTOMY N/A 11/14/2017   Procedure: XI ROBOTIC ASSISTED TOTAL HYSTERECTOMY WITH BILATERAL SALPINGO OOPHORECTOMY, RIGHT PELVIC LYMPHADENECTOMY;  Surgeon: Everitt Amber, MD;  Location: [facility];  Service: Gynecology;  Laterality: N/A;     OB History     Gravida  3   Para  1   Term  1   Preterm      AB  2   Living  1      SAB      IAB  2   Ectopic      Multiple      Live Births              Family History  Problem Relation Age of Onset   Anuerysm Mother    Congestive Heart Failure Mother    Hypertension Father    Hypertension Sister    Hypertension Brother    Colon cancer Neg Hx     Social History   Tobacco Use   Smoking status: Former    Packs/day: 0.30    Years: 26.00    Pack years: 7.80    Types: Cigarettes   Smokeless tobacco: Never   Tobacco comments:    quit May 2019  Vaping Use   Vaping Use: Never used  Substance Use Topics   Alcohol use: Not Currently    Comment: social drinking   Drug use: Yes    Types: Cocaine, "Crack" cocaine    Home Medications Prior to Admission medications   Medication Sig Start Date End Date Taking? Authorizing Provider  amLODipine (NORVASC) 10 MG tablet Take 1 tablet (10 mg total) by mouth daily. 11/24/20 11/24/21  Charlott Rakes, MD  atorvastatin (LIPITOR) 40 MG tablet Take 0.5 tablets (20 mg total) by mouth daily. 11/24/20   Charlott Rakes, MD  buPROPion (WELLBUTRIN XL)  150 MG 24 hr tablet Take 1 tablet (150 mg total) by mouth daily. For smoking cessation 11/24/20   Charlott Rakes, MD  cloNIDine (CATAPRES) 0.2 MG tablet TAKE 1 TABLET (0.2 MG TOTAL) BY MOUTH AT BEDTIME. Patient taking differently: Take 0.2 mg by mouth daily. 06/16/20 06/16/21  Park Liter, MD  furosemide (LASIX) 40 MG tablet Take 1 tablet (40 mg total) by mouth 2 (two) times daily. 11/24/20   Charlott Rakes, MD  hydrALAZINE (APRESOLINE) 50 MG tablet Take  1.5 tablets (75 mg total) by mouth 3 (three) times daily. 12/21/19   Andrew Au, MD  pantoprazole (PROTONIX) 40 MG tablet TAKE 1 TABLET (40 MG TOTAL) BY MOUTH DAILY. 11/20/20 11/20/21  Charlott Rakes, MD  prochlorperazine (COMPAZINE) 10 MG tablet Take 1 tablet (10 mg total) by mouth every 6 (six) hours as needed (Nausea or vomiting). Patient not taking: Reported on 05/25/2018 12/08/17 05/29/18  Heath Lark, MD    Allergies    Vicodin [hydrocodone-acetaminophen]  Review of Systems   Review of Systems  Constitutional:  Negative for chills and fever.  HENT:  Negative for ear pain and sore throat.   Eyes:  Negative for pain and visual disturbance.  Respiratory:  Positive for cough and shortness of breath.   Cardiovascular:  Positive for chest pain. Negative for palpitations.  Gastrointestinal:  Negative for abdominal pain and vomiting.  Genitourinary:  Negative for dysuria and hematuria.  Musculoskeletal:  Negative for arthralgias and back pain.  Skin:  Negative for color change and rash.  Neurological:  Negative for seizures and syncope.  All other systems reviewed and are negative.  Physical Exam Updated Vital Signs BP (!) 181/104 (BP Location: Left Arm)   Pulse 72   Temp 98.2 F (36.8 C) (Oral)   Resp (!) 24   LMP  (LMP Unknown) Comment: vaginal bleeding continuously for last 8 months  SpO2 96%   Physical Exam Vitals and nursing note reviewed.  Constitutional:      Appearance: She is well-developed.  HENT:     Head:  Normocephalic and atraumatic.  Cardiovascular:     Rate and Rhythm: Normal rate and regular rhythm.     Heart sounds: Normal heart sounds.  Pulmonary:     Effort: Pulmonary effort is normal. No tachypnea.     Breath sounds: Wheezing present.  Musculoskeletal:     Right lower leg: No edema.     Left lower leg: No edema.  Skin:    General: Skin is warm and dry.  Neurological:     General: No focal deficit present.     Mental Status: She is alert and oriented to person, place, and time.  Psychiatric:        Mood and Affect: Mood normal.        Behavior: Behavior normal.    ED Results / Procedures / Treatments   Labs (all labs ordered are listed, but only abnormal results are displayed) Labs Reviewed  CBC WITH DIFFERENTIAL/PLATELET - Abnormal; Notable for the following components:      Result Value   RDW 15.9 (*)    All other components within normal limits  COMPREHENSIVE METABOLIC PANEL - Abnormal; Notable for the following components:   BUN 24 (*)    Creatinine, Ser 1.56 (*)    GFR, Estimated 38 (*)    All other components within normal limits  FIBRINOGEN - Abnormal; Notable for the following components:   Fibrinogen 613 (*)    All other components within normal limits  CULTURE, BLOOD (ROUTINE X 2)  CULTURE, BLOOD (ROUTINE X 2)  LACTIC ACID, PLASMA  D-DIMER, QUANTITATIVE  LACTATE DEHYDROGENASE  FERRITIN  TRIGLYCERIDES  C-REACTIVE PROTEIN  LACTIC ACID, PLASMA  PROCALCITONIN  HIV ANTIBODY (ROUTINE TESTING W REFLEX)  CBC WITH DIFFERENTIAL/PLATELET  COMPREHENSIVE METABOLIC PANEL  C-REACTIVE PROTEIN  D-DIMER, QUANTITATIVE  PROCALCITONIN  TROPONIN I (HIGH SENSITIVITY)  TROPONIN I (HIGH SENSITIVITY)    EKG EKG Interpretation  Date/Time:  Thursday January 07 2021 21:02:53 EDT Ventricular Rate:  72 PR Interval:  141 QRS Duration: 83 QT Interval:  441 QTC Calculation: 483 R Axis:   -1 Text Interpretation: Sinus rhythm Consider right atrial enlargement LVH with  secondary repolarization abnormality no acute ischemia axis normal Confirmed by Lorre Munroe ([id number]) on 01/07/2021 9:40:09 PM  Radiology DG Chest 2 View  Result Date: 01/07/2021 CLINICAL DATA:  Shortness of breath and COVID-19 positivity EXAM: CHEST - 2 VIEW COMPARISON:  01/02/2021 FINDINGS: The heart size and mediastinal contours are within normal limits. Both lungs are clear. The visualized skeletal structures are unremarkable. IMPRESSION: No active cardiopulmonary disease. Electronically Signed   By: Inez Catalina M.D.   On: 01/07/2021 13:14    Procedures Procedures   Medications Ordered in ED Medications  dexamethasone (DECADRON) injection 6 mg (has no administration in time range)    ED Course  I have reviewed the triage vital signs and the nursing notes.  Pertinent labs & imaging results that were available during my care of the patient were reviewed by me and considered in my medical decision making (see chart for details).    MDM Rules/Calculators/A&P                           Angela Gardner presented to the ED with a diagnosis of COVID-19 and a room air sat of 88%.  She has significant medical comorbidities.  She was placed on oxygen and given Decadron.  She will be admitted for further treatment. Final Clinical Impression(s) / ED Diagnoses Final diagnoses:  COVID-19  Hypoxia    Rx / DC Orders ED Discharge Orders     None        Arnaldo Natal, MD 01/07/21 2336

## 2021-01-07 NOTE — ED Provider Notes (Signed)
Emergency Medicine Provider Triage Evaluation Note  Angela Gardner , a 60 y.o. female  was evaluated in triage.  Pt complains of shortness of breath x5 days, states that she thinks her sister gave her COVID as she works in [employer].  Went to urgent care today she had a positive COVID test.  She is here because she has consistent shortness of breath and 10 out of 10 chest pain.  Has history of congestive heart failure with preserved ejection fraction, denies worsening orthopnea, worsening pedal edema, states her whole body just hurts.  Has no other complaints.  Review of Systems  Positive: Shortness of breath, chest pain Negative: Orthopnea, pedal edema  Physical Exam  BP (!) 163/102   Pulse 75   Temp 98.6 F (37 C) (Oral)   Resp (!) 22   LMP  (LMP Unknown) Comment: vaginal bleeding continuously for last 8 months  SpO2 96%  Gen:   Awake, no distress   Resp:  Normal effort  MSK:   Moves extremities without difficulty  Other:    Medical Decision Making  Medically screening exam initiated at 12:28 PM.  Appropriate orders placed.  Lounette Sloan was informed that the remainder of the evaluation will be completed by another provider, this initial triage assessment does not replace that evaluation, and the importance of remaining in the ED until their evaluation is complete.  Patient presents with shortness of breath, chest pain lab work, imaging have been ordered, patient will need further work-up.   Marcello Fennel, PA-C 01/07/21 1230    Tegeler, Gwenyth Allegra, MD 01/07/21 1245

## 2021-01-07 NOTE — ED Triage Notes (Signed)
Pt here POV with c/o of SOb and body aches. [facility] told to come to ED d/t positive covid test. Lungs sound diminished but clear. Pt states CP 10/10. Cardiac history on file.

## 2021-01-07 NOTE — ED Triage Notes (Signed)
Pt reports testing positive for COVID this morning. Pt endorses SHOB. Pt is hypoxic at 88% RA. Placed on 2L O2.

## 2021-01-08 ENCOUNTER — Ambulatory Visit: Payer: Medicare Other | Admitting: Cardiology

## 2021-01-08 DIAGNOSIS — J9601 Acute respiratory failure with hypoxia: Secondary | ICD-10-CM

## 2021-01-08 DIAGNOSIS — U071 COVID-19: Principal | ICD-10-CM

## 2021-01-08 LAB — COMPREHENSIVE METABOLIC PANEL
ALT: 15 U/L (ref 0–44)
AST: 16 U/L (ref 15–41)
Albumin: 3.8 g/dL (ref 3.5–5.0)
Alkaline Phosphatase: 96 U/L (ref 38–126)
Anion gap: 7 (ref 5–15)
BUN: 29 mg/dL — ABNORMAL HIGH (ref 6–20)
CO2: 23 mmol/L (ref 22–32)
Calcium: 9.2 mg/dL (ref 8.9–10.3)
Chloride: 108 mmol/L (ref 98–111)
Creatinine, Ser: 1.66 mg/dL — ABNORMAL HIGH (ref 0.44–1.00)
GFR, Estimated: 36 mL/min — ABNORMAL LOW (ref >60)
Glucose, Bld: 135 mg/dL — ABNORMAL HIGH (ref 70–99)
Potassium: 4.5 mmol/L (ref 3.5–5.1)
Sodium: 138 mmol/L (ref 135–145)
Total Bilirubin: 0.6 mg/dL (ref 0.3–1.2)
Total Protein: 7.1 g/dL (ref 6.5–8.1)

## 2021-01-08 LAB — CBC WITH DIFFERENTIAL/PLATELET
Abs Immature Granulocytes: 0.02 10*3/uL (ref 0.00–0.07)
Basophils Absolute: 0 10*3/uL (ref 0.0–0.1)
Basophils Relative: 0 %
Eosinophils Absolute: 0 10*3/uL (ref 0.0–0.5)
Eosinophils Relative: 0 %
HCT: 44.7 % (ref 36.0–46.0)
Hemoglobin: 14.2 g/dL (ref 12.0–15.0)
Immature Granulocytes: 1 %
Lymphocytes Relative: 23 %
Lymphs Abs: 1 10*3/uL (ref 0.7–4.0)
MCH: 29.3 pg (ref 26.0–34.0)
MCHC: 31.8 g/dL (ref 30.0–36.0)
MCV: 92.4 fL (ref 80.0–100.0)
Monocytes Absolute: 0.2 10*3/uL (ref 0.1–1.0)
Monocytes Relative: 4 %
Neutro Abs: 3 10*3/uL (ref 1.7–7.7)
Neutrophils Relative %: 72 %
Platelets: 221 10*3/uL (ref 150–400)
RBC: 4.84 MIL/uL (ref 3.87–5.11)
RDW: 15.9 % — ABNORMAL HIGH (ref 11.5–15.5)
WBC: 4.2 10*3/uL (ref 4.0–10.5)
nRBC: 0 % (ref 0.0–0.2)

## 2021-01-08 LAB — CBG MONITORING, ED
Glucose-Capillary: 108 mg/dL — ABNORMAL HIGH (ref 70–99)
Glucose-Capillary: 127 mg/dL — ABNORMAL HIGH (ref 70–99)
Glucose-Capillary: 137 mg/dL — ABNORMAL HIGH (ref 70–99)

## 2021-01-08 LAB — D-DIMER, QUANTITATIVE: D-Dimer, Quant: <0.27 ug/mL-FEU (ref 0.00–0.50)

## 2021-01-08 LAB — TROPONIN I (HIGH SENSITIVITY): Troponin I (High Sensitivity): 16 ng/L (ref <18)

## 2021-01-08 LAB — C-REACTIVE PROTEIN: CRP: 2.5 mg/dL — ABNORMAL HIGH (ref <1.0)

## 2021-01-08 LAB — GLUCOSE, CAPILLARY: Glucose-Capillary: 149 mg/dL — ABNORMAL HIGH (ref 70–99)

## 2021-01-08 LAB — LACTIC ACID, PLASMA: Lactic Acid, Venous: 0.8 mmol/L (ref 0.5–1.9)

## 2021-01-08 LAB — PROCALCITONIN
Procalcitonin: <0.1 ng/mL
Procalcitonin: <0.1 ng/mL

## 2021-01-08 LAB — HIV ANTIBODY (ROUTINE TESTING W REFLEX): HIV Screen 4th Generation wRfx: NONREACTIVE

## 2021-01-08 NOTE — Progress Notes (Signed)
Pt instructed on use of Flutter Valve.  Pt demonstrated with good effort and technique.  

## 2021-01-08 NOTE — Plan of Care (Signed)
Care plan initiated.

## 2021-01-08 NOTE — ED Notes (Signed)
Pt provided supplies to wash up.

## 2021-01-08 NOTE — ED Notes (Signed)
Messaged floor coverage to make aware of pt's bradycardia through out the night while pt has been sleeping.  Pt in no acute distress, easy to awaken, a&ox4.  Await call back.

## 2021-01-08 NOTE — Progress Notes (Signed)
PROGRESS NOTE    Angela Gardner  [id number] DOB: [birth year] DOA: 01/07/2021 PCP: Charlott Rakes, MD    Brief Narrative:  60 year old female with history of diastolic congestive heart failure, ongoing cocaine use, hypertension, CKD stage IIIa, unvaccinated against COVID-19 presented to the emergency room with 5 days of progressively worsening shortness of breath.  In the emergency room she was 88% on room air.  Chest x-ray was essentially normal.  [facility]  Patient required admission due to significant symptoms and multiple comorbidities for treatment of COVID-19 pneumonia.   Assessment & Plan:   Principal Problem:   Acute hypoxemic respiratory failure due to COVID-19 [facility]) Active Problems:   Essential hypertension   Diastolic congestive heart failure, NYHA class 3 (HCC)   Chronic kidney disease (CKD), stage III (moderate) (HCC)   Cocaine abuse (HCC)  Acute hypoxemic respiratory failure due to COVID-19 pneumonia: Continue to monitor due to significant symptoms  chest physiotherapy, incentive spirometry, deep breathing exercises, sputum induction, mucolytic's and bronchodilators. Supplemental oxygen to keep saturations more than 90%. Covid directed therapy with , steroids, on Solu-Medrol remdesivir, day 2/5 antibiotics, not indicated Due to severity of symptoms, patient will need daily inflammatory markers, chest x-rays, liver function test to monitor and direct COVID-19 therapies. COVID-19 Labs  Recent Labs    01/07/21 2055 01/08/21 0427  DDIMER <0.27 <0.27  FERRITIN 104  --   LDH 179  --   CRP 0.9 2.5*    Chronic diastolic congestive heart failure: Euvolemic.  Continue home dose of Lasix.  Cocaine abuse: Ongoing.  Counseled.  CKD stage IIIa: Chronic and is stable at about baseline.   DVT prophylaxis:   Lovenox subcu   Code Status: Full code Family Communication: None Disposition Plan: Status is: Inpatient  Remains inpatient appropriate  because:Inpatient level of care appropriate due to severity of illness  Dispo: The patient is from: Home              Anticipated d/c is to: Home              Patient currently is not medically stable to d/c.   Difficult to place patient No         Consultants:  None  Procedures:  None  Antimicrobials:  Remdesivir 7/21---   Subjective: Patient seen and examined.  She is still in the emergency room.  She was trying to wash off in a sink, was visibly short of breath and not using oxygen.  She tells me that she has felt somehow better but still wheezing and short of breath.  Remains afebrile.  Objective: Vitals:   01/08/21 0500 01/08/21 0645 01/08/21 0800 01/08/21 1136  BP: 125/78  116/60 (!) 138/93  Pulse: (!) 52 (!) 50 (!) 47 60  Resp: '18  20 18  '$ Temp:      TempSrc:      SpO2: 95% 98% 97% 99%   No intake or output data in the 24 hours ending 01/08/21 1354 There were no vitals filed for this visit.  Examination:  General exam: Appears in mild distress but mostly comfortable. Respiratory system: Some expiratory wheezes in upper airway sounds. On 2 L oxygen currently. Cardiovascular system: S1 & S2 heard, RRR.  No edema.   Gastrointestinal system: Soft and nontender.   Central nervous system: Alert and oriented. No focal neurological deficits. Extremities: Symmetric 5 x 5 power. Skin: No rashes, lesions or ulcers Psychiatry: Judgement and insight appear normal. Mood & affect appropriate.  Data Reviewed: I have personally reviewed following labs and imaging studies  CBC: Recent Labs  Lab 01/02/21 1740 01/07/21 1224 01/07/21 2055 01/08/21 0427  WBC 7.0 4.6 4.5 4.2  NEUTROABS 5.1  --  2.5 3.0  HGB 14.6 14.2 14.5 14.2  HCT 44.4 45.6 45.7 44.7  MCV 90.4 92.3 92.3 92.4  PLT 282 246 219 [id number]   Basic Metabolic Panel: Recent Labs  Lab 01/02/21 1845 01/07/21 1224 01/07/21 2055 01/08/21 0427  NA 140 141 142 138  K 3.9 3.7 4.0 4.5  CL 106 107 107 108   CO2 '25 26 25 23  '$ GLUCOSE 119* 100* 91 135*  BUN 29* 22* 24* 29*  CREATININE 2.03* 1.68* 1.56* 1.66*  CALCIUM 8.8* 9.2 9.2 9.2   GFR: Estimated Creatinine Clearance: 44.8 mL/min (A) (by C-G formula based on SCr of 1.66 mg/dL (H)). Liver Function Tests: Recent Labs  Lab 01/07/21 2055 01/08/21 0427  AST 17 16  ALT 16 15  ALKPHOS 103 96  BILITOT 0.7 0.6  PROT 7.7 7.1  ALBUMIN 4.1 3.8   No results for input(s): LIPASE, AMYLASE in the last 168 hours. No results for input(s): AMMONIA in the last 168 hours. Coagulation Profile: No results for input(s): INR, PROTIME in the last 168 hours. Cardiac Enzymes: No results for input(s): CKTOTAL, CKMB, CKMBINDEX, TROPONINI in the last 168 hours. BNP (last 3 results) No results for input(s): PROBNP in the last 8760 hours. HbA1C: No results for input(s): HGBA1C in the last 72 hours. CBG: Recent Labs  Lab 01/08/21 0814 01/08/21 1211 01/08/21 1219  GLUCAP 108* 137* 127*   Lipid Profile: Recent Labs    01/07/21 2055  TRIG 88   Thyroid Function Tests: No results for input(s): TSH, T4TOTAL, FREET4, T3FREE, THYROIDAB in the last 72 hours. Anemia Panel: Recent Labs    01/07/21 2055  FERRITIN 104   Sepsis Labs: Recent Labs  Lab 01/07/21 2055 01/07/21 2330  PROCALCITON <0.10 <0.10  LATICACIDVEN 1.2 0.8    Recent Results (from the past 240 hour(s))  Blood Culture (routine x 2)     Status: None (Preliminary result)   Collection Time: 01/07/21  7:24 PM   Specimen: BLOOD  Result Value Ref Range Status   Specimen Description   Final    BLOOD LEFT ANTECUBITAL Performed at [facility], [place] [address] [place], [place] [address]    Special Requests   Final    BOTTLES DRAWN AEROBIC AND ANAEROBIC Blood Culture results may not be optimal due to an inadequate volume of blood received in culture bottles Performed at [facility] [address], [place], [place] [address]    Culture   Final     NO GROWTH < 12 HOURS Performed at [facility] [address], [place], [place] [address]    Report Status PENDING  Incomplete         Radiology Studies: DG Chest 2 View  Result Date: 01/07/2021 CLINICAL DATA:  Shortness of breath and COVID-19 positivity EXAM: CHEST - 2 VIEW COMPARISON:  01/02/2021 FINDINGS: The heart size and mediastinal contours are within normal limits. Both lungs are clear. The visualized skeletal structures are unremarkable. IMPRESSION: No active cardiopulmonary disease. Electronically Signed   By: Inez Catalina M.D.   On: 01/07/2021 13:14        Scheduled Meds:  amLODipine  10 mg Oral QHS   atorvastatin  20 mg Oral QHS   buPROPion  150 mg Oral QHS  cloNIDine  0.2 mg Oral Daily   enoxaparin (LOVENOX) injection  55 mg Subcutaneous Q24H   furosemide  40 mg Oral BID   hydrALAZINE  75 mg Oral TID   methylPREDNISolone (SOLU-MEDROL) injection  0.5 mg/kg Intravenous Q12H   Followed by   Derrill Memo ON 01/11/2021] predniSONE  50 mg Oral Daily   pantoprazole  40 mg Oral Daily   Continuous Infusions:  remdesivir 100 mg in NS 100 mL 100 mg (01/08/21 0939)     LOS: 1 day    Time spent: 32 minutes    Barb Merino, MD [facility] Pager [phone]

## 2021-01-08 NOTE — ED Notes (Signed)
Pt ambulated to restroom with steady gait.

## 2021-01-08 NOTE — Plan of Care (Signed)
  Problem: Education: Goal: Knowledge of risk factors and measures for prevention of condition will improve Outcome: Progressing   Problem: Coping: Goal: Psychosocial and spiritual needs will be supported Outcome: Progressing   Problem: Respiratory: Goal: Will maintain a patent airway Outcome: Progressing Goal: Complications related to the disease process, condition or treatment will be avoided or minimized Outcome: Progressing   

## 2021-01-08 NOTE — ED Notes (Signed)
Pt given sandwich and water per request.

## 2021-01-08 NOTE — ED Notes (Signed)
Spoke with NP J. Olena Heckle regarding bradycardia, pt has hx of diastolic heart failure, ok to continue to monitor pt and report to provider if HR drops lower than 45 or pt becomes symptomatic.

## 2021-01-09 LAB — COMPREHENSIVE METABOLIC PANEL
ALT: 14 U/L (ref 0–44)
AST: 16 U/L (ref 15–41)
Albumin: 3.7 g/dL (ref 3.5–5.0)
Alkaline Phosphatase: 93 U/L (ref 38–126)
Anion gap: 8 (ref 5–15)
BUN: 39 mg/dL — ABNORMAL HIGH (ref 6–20)
CO2: 22 mmol/L (ref 22–32)
Calcium: 9.2 mg/dL (ref 8.9–10.3)
Chloride: 106 mmol/L (ref 98–111)
Creatinine, Ser: 2.03 mg/dL — ABNORMAL HIGH (ref 0.44–1.00)
GFR, Estimated: 28 mL/min — ABNORMAL LOW (ref >60)
Glucose, Bld: 134 mg/dL — ABNORMAL HIGH (ref 70–99)
Potassium: 4.1 mmol/L (ref 3.5–5.1)
Sodium: 136 mmol/L (ref 135–145)
Total Bilirubin: 0.6 mg/dL (ref 0.3–1.2)
Total Protein: 7 g/dL (ref 6.5–8.1)

## 2021-01-09 LAB — D-DIMER, QUANTITATIVE: D-Dimer, Quant: <0.27 ug/mL-FEU (ref 0.00–0.50)

## 2021-01-09 LAB — CBC WITH DIFFERENTIAL/PLATELET
Abs Immature Granulocytes: 0.01 10*3/uL (ref 0.00–0.07)
Basophils Absolute: 0 10*3/uL (ref 0.0–0.1)
Basophils Relative: 0 %
Eosinophils Absolute: 0 10*3/uL (ref 0.0–0.5)
Eosinophils Relative: 0 %
HCT: 43.1 % (ref 36.0–46.0)
Hemoglobin: 13.9 g/dL (ref 12.0–15.0)
Immature Granulocytes: 0 %
Lymphocytes Relative: 19 %
Lymphs Abs: 0.9 10*3/uL (ref 0.7–4.0)
MCH: 29.2 pg (ref 26.0–34.0)
MCHC: 32.3 g/dL (ref 30.0–36.0)
MCV: 90.5 fL (ref 80.0–100.0)
Monocytes Absolute: 0.3 10*3/uL (ref 0.1–1.0)
Monocytes Relative: 7 %
Neutro Abs: 3.4 10*3/uL (ref 1.7–7.7)
Neutrophils Relative %: 74 %
Platelets: 252 10*3/uL (ref 150–400)
RBC: 4.76 MIL/uL (ref 3.87–5.11)
RDW: 15.8 % — ABNORMAL HIGH (ref 11.5–15.5)
WBC: 4.6 10*3/uL (ref 4.0–10.5)
nRBC: 0 % (ref 0.0–0.2)

## 2021-01-09 LAB — GLUCOSE, CAPILLARY
Glucose-Capillary: 107 mg/dL — ABNORMAL HIGH (ref 70–99)
Glucose-Capillary: 207 mg/dL — ABNORMAL HIGH (ref 70–99)
Glucose-Capillary: 219 mg/dL — ABNORMAL HIGH (ref 70–99)
Glucose-Capillary: 197 mg/dL — ABNORMAL HIGH (ref 70–99)

## 2021-01-09 LAB — C-REACTIVE PROTEIN: CRP: 1 mg/dL — ABNORMAL HIGH (ref <1.0)

## 2021-01-09 NOTE — Progress Notes (Signed)
PROGRESS NOTE    Angela Gardner  [id number] DOB: 06/26/1960 DOA: 01/07/2021 PCP: Charlott Rakes, MD    Brief Narrative:  60 year old female with history of diastolic congestive heart failure, ongoing cocaine use, hypertension, CKD stage IIIa, unvaccinated against COVID-19 presented to the emergency room with 5 days of progressively worsening shortness of breath.  In the emergency room she was 88% on room air.  Chest x-ray was essentially normal.  [facility]  Patient required admission due to significant symptoms and multiple comorbidities for treatment of COVID-19 pneumonia.   Assessment & Plan:   Principal Problem:   Acute hypoxemic respiratory failure due to COVID-19 [facility]) Active Problems:   Essential hypertension   Diastolic congestive heart failure, NYHA class 3 (HCC)   Chronic kidney disease (CKD), stage III (moderate) (HCC)   Cocaine abuse (HCC)  Acute hypoxemic respiratory failure due to COVID-19 pneumonia: Continue to monitor due to significant symptoms , weaning off oxygen today.  Not mobilized yet. chest physiotherapy, incentive spirometry, deep breathing exercises, sputum induction, mucolytic's and bronchodilators. Supplemental oxygen to keep saturations more than 90%. Covid directed therapy with , steroids, on Solu-Medrol remdesivir, day 3/5 antibiotics, not indicated Due to severity of symptoms, patient will need daily inflammatory markers, chest x-rays, liver function test to monitor and direct COVID-19 therapies. COVID-19 Labs  Recent Labs    01/07/21 2055 01/08/21 0427 01/09/21 0251  DDIMER <0.27 <0.27 <0.27  FERRITIN 104  --   --   LDH 179  --   --   CRP 0.9 2.5* 1.0*    Chronic diastolic congestive heart failure: Euvolemic.  Continue home dose of Lasix.  Cocaine abuse: Ongoing.  Counseled.  CKD stage IIIa: Chronic and is stable at about baseline.  Wean off oxygen.  Start mobilizing with PT OT today.   DVT prophylaxis:    Lovenox subcu   Code Status: Full code Family Communication: None Disposition Plan: Status is: Inpatient  Remains inpatient appropriate because:Inpatient level of care appropriate due to severity of illness  Dispo: The patient is from: Home              Anticipated d/c is to: Home              Patient currently is not medically stable to d/c.   Difficult to place patient No         Consultants:  None  Procedures:  None  Antimicrobials:  Remdesivir 7/21---   Subjective: Seen and examined.  Still has significant cough and wheezing.  Afebrile. Objective: Vitals:   01/08/21 2056 01/08/21 2300 01/09/21 0006 01/09/21 0532  BP: (!) 156/85  123/76 135/87  Pulse: (!) 53  (!) 55 67  Resp: '18  18 20  '$ Temp: 98.5 F (36.9 C)  98.1 F (36.7 C) 97.9 F (36.6 C)  TempSrc: Oral  Oral Oral  SpO2: 98%  96% 97%  Height:  '5\' 4"'$  (1.626 m)      Intake/Output Summary (Last 24 hours) at 01/09/2021 1029 Last data filed at 01/08/2021 1534 Gross per 24 hour  Intake 100 ml  Output --  Net 100 ml   There were no vitals filed for this visit.  Examination:  General exam: Appears mildly anxious.  Comfortable at rest. Respiratory system: Some expiratory wheezes in upper airway sounds. On 2 L oxygen currently. Cardiovascular system: S1 & S2 heard, RRR.  No edema.   Gastrointestinal system: Soft and nontender.   Central nervous system: Alert and oriented. No focal neurological  deficits. Extremities: Symmetric 5 x 5 power. Skin: No rashes, lesions or ulcers Psychiatry: Judgement and insight appear normal. Mood & affect appropriate.     Data Reviewed: I have personally reviewed following labs and imaging studies  CBC: Recent Labs  Lab 01/02/21 1740 01/07/21 1224 01/07/21 2055 01/08/21 0427 01/09/21 0251  WBC 7.0 4.6 4.5 4.2 4.6  NEUTROABS 5.1  --  2.5 3.0 3.4  HGB 14.6 14.2 14.5 14.2 13.9  HCT 44.4 45.6 45.7 44.7 43.1  MCV 90.4 92.3 92.3 92.4 90.5  PLT 282 246 219 221 [id number]    Basic Metabolic Panel: Recent Labs  Lab 01/02/21 1845 01/07/21 1224 01/07/21 2055 01/08/21 0427 01/09/21 0251  NA 140 141 142 138 136  K 3.9 3.7 4.0 4.5 4.1  CL 106 107 107 108 106  CO2 '25 26 25 23 22  '$ GLUCOSE 119* 100* 91 135* 134*  BUN 29* 22* 24* 29* 39*  CREATININE 2.03* 1.68* 1.56* 1.66* 2.03*  CALCIUM 8.8* 9.2 9.2 9.2 9.2   GFR: Estimated Creatinine Clearance: 36.7 mL/min (A) (by C-G formula based on SCr of 2.03 mg/dL (H)). Liver Function Tests: Recent Labs  Lab 01/07/21 2055 01/08/21 0427 01/09/21 0251  AST '17 16 16  '$ ALT '16 15 14  '$ ALKPHOS 103 96 93  BILITOT 0.7 0.6 0.6  PROT 7.7 7.1 7.0  ALBUMIN 4.1 3.8 3.7   No results for input(s): LIPASE, AMYLASE in the last 168 hours. No results for input(s): AMMONIA in the last 168 hours. Coagulation Profile: No results for input(s): INR, PROTIME in the last 168 hours. Cardiac Enzymes: No results for input(s): CKTOTAL, CKMB, CKMBINDEX, TROPONINI in the last 168 hours. BNP (last 3 results) No results for input(s): PROBNP in the last 8760 hours. HbA1C: No results for input(s): HGBA1C in the last 72 hours. CBG: Recent Labs  Lab 01/08/21 0814 01/08/21 1211 01/08/21 1219 01/08/21 2046 01/09/21 0725  GLUCAP 108* 137* 127* 149* 107*   Lipid Profile: Recent Labs    01/07/21 2055  TRIG 88   Thyroid Function Tests: No results for input(s): TSH, T4TOTAL, FREET4, T3FREE, THYROIDAB in the last 72 hours. Anemia Panel: Recent Labs    01/07/21 2055  FERRITIN 104   Sepsis Labs: Recent Labs  Lab 01/07/21 2055 01/07/21 2330  PROCALCITON <0.10 <0.10  LATICACIDVEN 1.2 0.8    Recent Results (from the past 240 hour(s))  Blood Culture (routine x 2)     Status: None (Preliminary result)   Collection Time: 01/07/21  7:24 PM   Specimen: BLOOD  Result Value Ref Range Status   Specimen Description   Final    BLOOD LEFT ANTECUBITAL Performed at [facility], [place] [address], [place], [place]  [address]    Special Requests   Final    BOTTLES DRAWN AEROBIC AND ANAEROBIC Blood Culture results may not be optimal due to an inadequate volume of blood received in culture bottles Performed at [facility] [address] [place], [place] [address]    Culture   Final    NO GROWTH 2 DAYS Performed at [facility] [address], [place], [place] [address]    Report Status PENDING  Incomplete  Blood Culture (routine x 2)     Status: None (Preliminary result)   Collection Time: 01/07/21  7:29 PM   Specimen: BLOOD  Result Value Ref Range Status   Specimen Description   Final    BLOOD RIGHT ANTECUBITAL Performed at [facility], [place] [address], [place], [place] [address]    Special Requests   Final    BOTTLES DRAWN AEROBIC AND ANAEROBIC Blood Culture adequate volume Performed at [facility] [address] [place], [place] [address]    Culture   Final    NO GROWTH 1 DAY Performed at [facility], [place] [address], [place], [place] [address]    Report Status PENDING  Incomplete         Radiology Studies: DG Chest 2 View  Result Date: 01/07/2021 CLINICAL DATA:  Shortness of breath and COVID-19 positivity EXAM: CHEST - 2 VIEW COMPARISON:  01/02/2021 FINDINGS: The heart size and mediastinal contours are within normal limits. Both lungs are clear. The visualized skeletal structures are unremarkable. IMPRESSION: No active cardiopulmonary disease. Electronically Signed   By: Inez Catalina M.D.   On: 01/07/2021 13:14        Scheduled Meds:  amLODipine  10 mg Oral QHS   atorvastatin  20 mg Oral QHS   buPROPion  150 mg Oral QHS   cloNIDine  0.2 mg Oral Daily   enoxaparin (LOVENOX) injection  55 mg Subcutaneous Q24H   furosemide  40 mg Oral BID   hydrALAZINE  75 mg Oral TID   methylPREDNISolone (SOLU-MEDROL) injection  0.5 mg/kg Intravenous Q12H   Followed by   Derrill Memo ON 01/11/2021] predniSONE  50 mg Oral Daily    pantoprazole  40 mg Oral Daily   Continuous Infusions:  remdesivir 100 mg in NS 100 mL Stopped (01/08/21 1534)     LOS: 2 days    Time spent: 30 minutes    Barb Merino, MD [facility] Pager [phone]

## 2021-01-09 NOTE — Plan of Care (Signed)
  Problem: Education: Goal: Knowledge of risk factors and measures for prevention of condition will improve Outcome: Progressing   Problem: Coping: Goal: Psychosocial and spiritual needs will be supported Outcome: Progressing   Problem: Respiratory: Goal: Will maintain a patent airway Outcome: Progressing Goal: Complications related to the disease process, condition or treatment will be avoided or minimized Outcome: Progressing   

## 2021-01-10 LAB — COMPREHENSIVE METABOLIC PANEL
ALT: 15 U/L (ref 0–44)
AST: 16 U/L (ref 15–41)
Albumin: 3.7 g/dL (ref 3.5–5.0)
Alkaline Phosphatase: 82 U/L (ref 38–126)
Anion gap: 9 (ref 5–15)
BUN: 43 mg/dL — ABNORMAL HIGH (ref 6–20)
CO2: 23 mmol/L (ref 22–32)
Calcium: 9 mg/dL (ref 8.9–10.3)
Chloride: 105 mmol/L (ref 98–111)
Creatinine, Ser: 1.96 mg/dL — ABNORMAL HIGH (ref 0.44–1.00)
GFR, Estimated: 29 mL/min — ABNORMAL LOW (ref >60)
Glucose, Bld: 147 mg/dL — ABNORMAL HIGH (ref 70–99)
Potassium: 4 mmol/L (ref 3.5–5.1)
Sodium: 137 mmol/L (ref 135–145)
Total Bilirubin: 0.3 mg/dL (ref 0.3–1.2)
Total Protein: 6.9 g/dL (ref 6.5–8.1)

## 2021-01-10 LAB — GLUCOSE, CAPILLARY
Glucose-Capillary: 126 mg/dL — ABNORMAL HIGH (ref 70–99)
Glucose-Capillary: 208 mg/dL — ABNORMAL HIGH (ref 70–99)
Glucose-Capillary: 113 mg/dL — ABNORMAL HIGH (ref 70–99)
Glucose-Capillary: 219 mg/dL — ABNORMAL HIGH (ref 70–99)

## 2021-01-10 LAB — CBC WITH DIFFERENTIAL/PLATELET
Abs Immature Granulocytes: 0.06 10*3/uL (ref 0.00–0.07)
Basophils Absolute: 0 10*3/uL (ref 0.0–0.1)
Basophils Relative: 0 %
Eosinophils Absolute: 0 10*3/uL (ref 0.0–0.5)
Eosinophils Relative: 0 %
HCT: 43.2 % (ref 36.0–46.0)
Hemoglobin: 14 g/dL (ref 12.0–15.0)
Immature Granulocytes: 1 %
Lymphocytes Relative: 15 %
Lymphs Abs: 1.2 10*3/uL (ref 0.7–4.0)
MCH: 29.5 pg (ref 26.0–34.0)
MCHC: 32.4 g/dL (ref 30.0–36.0)
MCV: 91.1 fL (ref 80.0–100.0)
Monocytes Absolute: 0.3 10*3/uL (ref 0.1–1.0)
Monocytes Relative: 3 %
Neutro Abs: 6.4 10*3/uL (ref 1.7–7.7)
Neutrophils Relative %: 81 %
Platelets: 250 10*3/uL (ref 150–400)
RBC: 4.74 MIL/uL (ref 3.87–5.11)
RDW: 15.8 % — ABNORMAL HIGH (ref 11.5–15.5)
WBC: 7.9 10*3/uL (ref 4.0–10.5)
nRBC: 0 % (ref 0.0–0.2)

## 2021-01-10 LAB — D-DIMER, QUANTITATIVE: D-Dimer, Quant: <0.27 ug/mL-FEU (ref 0.00–0.50)

## 2021-01-10 LAB — C-REACTIVE PROTEIN: CRP: 0.7 mg/dL (ref <1.0)

## 2021-01-10 MED ORDER — POLYVINYL ALCOHOL 1.4 % OP SOLN
1.0000 [drp] | OPHTHALMIC | Status: DC | PRN
  Administered 2021-01-10: 1 [drp] via OPHTHALMIC
  Filled 2021-01-10: qty 15

## 2021-01-10 NOTE — Evaluation (Signed)
Physical Therapy Evaluation Patient Details Name: Angela Gardner MRN: [id number] DOB: [birth year] Today's Date: 01/10/2021   History of Present Illness  60 year old female with history of diastolic congestive heart failure, ongoing cocaine use, hypertension, CKD stage IIIa, unvaccinated against COVID-19 presented to the emergency room with progressively worsening shortness of breath.  Chest x-ray was essentially normal.  [facility]  Patient required admission due to significant symptoms and multiple comorbidities for treatment of COVID-19 pneumonia.  Clinical Impression  Pt admitted with above diagnosis.  Pt amb ~ 40' in room progressing from IV pole support to no device, on RA, VSS. Uses cane occasionally at baseline, feels weaker than normal but pleased to be OOB.  May benefit from HHPT  Pt currently with functional limitations due to the deficits listed below (see PT Problem List). Pt will benefit from skilled PT to increase their independence and safety with mobility to allow discharge to the venue listed below.       Follow Up Recommendations Home health PT    Equipment Recommendations  None recommended by PT    Recommendations for Other Services       Precautions / Restrictions Precautions Precautions: Fall Restrictions Weight Bearing Restrictions: No      Mobility  Bed Mobility Overal bed mobility: Needs Assistance Bed Mobility: Supine to Sit     Supine to sit: HOB elevated;Min guard     General bed mobility comments: incr time and effort however able to complete without physical assist    Transfers Overall transfer level: Needs assistance Equipment used: Rolling walker (2 wheeled) Transfers: Sit to/from Stand Sit to Stand: Min assist         General transfer comment: assist to rise and steady, initially with slight "wooziness" on standing which resolved in <20 sec  Ambulation/Gait Ambulation/Gait assistance: Min guard;Min assist Gait  Distance (Feet): 40 Feet Assistive device: IV Pole;None Gait Pattern/deviations: Step-through pattern;Decreased stride length     General Gait Details: in room distance, pt unsteady however stability improved with distance, able to amb without IV pole support last 30'  Stairs            Wheelchair Mobility    Modified Rankin (Stroke Patients Only)       Balance Overall balance assessment: Needs assistance;History of Falls (pt reports falling  in shower in last few mos)   Sitting balance-Leahy Scale: Good       Standing balance-Leahy Scale: Fair                               Pertinent Vitals/Pain Pain Assessment: Faces Faces Pain Scale: Hurts a little bit Pain Location: L shoulder Pain Descriptors / Indicators: Sore Pain Intervention(s): Limited activity within patient's tolerance;Monitored during session;Repositioned    Home Living Family/patient expects to be discharged to:: Private residence Living Arrangements: Alone Available Help at Discharge: Family (sister, isn't a lot of help per pt) Type of Home: Mobile home Home Access: Stairs to enter Entrance Stairs-Rails: Right Entrance Stairs-Number of Steps: 3-4 Home Layout: One level Home Equipment: Cane - single point      Prior Function Level of Independence: Independent         Comments: pt reports mostly independent, uses cane at times (old notes state there are multiple holes in floor of pt's trailer so she does not typically use device)     Hand Dominance        Extremity/Trunk Assessment  Upper Extremity Assessment Upper Extremity Assessment: Overall WFL for tasks assessed    Lower Extremity Assessment Lower Extremity Assessment: RLE deficits/detail;LLE deficits/detail RLE Coordination: decreased gross motor LLE Coordination: decreased gross motor       Communication   Communication: No difficulties  Cognition Arousal/Alertness: Awake/alert Behavior During Therapy: WFL  for tasks assessed/performed Overall Cognitive Status: Within Functional Limits for tasks assessed                                        General Comments      Exercises     Assessment/Plan    PT Assessment Patient needs continued PT services  PT Problem List Decreased mobility;Decreased activity tolerance;Decreased balance;Decreased knowledge of use of DME;Decreased coordination       PT Treatment Interventions Therapeutic exercise;Gait training;Functional mobility training;Therapeutic activities;Patient/family education;Balance training    PT Goals (Current goals can be found in the Care Plan section)  Acute Rehab PT Goals Patient Stated Goal: get stronger PT Goal Formulation: With patient Time For Goal Achievement: 01/31/21 Potential to Achieve Goals: Good    Frequency Min 3X/week   Barriers to discharge        Co-evaluation               AM-PAC PT "6 Clicks" Mobility  Outcome Measure Help needed turning from your back to your side while in a flat bed without using bedrails?: A Little Help needed moving from lying on your back to sitting on the side of a flat bed without using bedrails?: A Little Help needed moving to and from a bed to a chair (including a wheelchair)?: A Little Help needed standing up from a chair using your arms (e.g., wheelchair or bedside chair)?: A Little Help needed to walk in hospital room?: A Little Help needed climbing 3-5 steps with a railing? : A Little 6 Click Score: 18    End of Session Equipment Utilized During Treatment: Gait belt Activity Tolerance: Patient tolerated treatment well Patient left: in chair;with call bell/phone within reach;with chair alarm set   PT Visit Diagnosis: Other abnormalities of gait and mobility (R26.89)    Time: [id number] PT Time Calculation (min) (ACUTE ONLY): 22 min   Charges:   PT Evaluation $PT Eval Low Complexity: [place], PT  Acute Rehab Dept ([place])  [phone] Pager [phone]  01/10/2021   [facility] 01/10/2021, 12:34 PM

## 2021-01-10 NOTE — Progress Notes (Signed)
PROGRESS NOTE    Angela Gardner  [id number] DOB: [birth year] DOA: 01/07/2021 PCP: Charlott Rakes, MD    Brief Narrative:  60 year old female with history of diastolic congestive heart failure, ongoing cocaine use, hypertension, CKD stage IIIa, unvaccinated against COVID-19 presented to the emergency room with 5 days of progressively worsening shortness of breath.  In the emergency room she was 88% on room air.  Chest x-ray was essentially normal.  [facility]  Patient required admission due to significant symptoms and multiple comorbidities for treatment of COVID-19 pneumonia.   Assessment & Plan:   Principal Problem:   Acute hypoxemic respiratory failure due to COVID-19 [facility]) Active Problems:   Essential hypertension   Diastolic congestive heart failure, NYHA class 3 (HCC)   Chronic kidney disease (CKD), stage III (moderate) (HCC)   Cocaine abuse (HCC)  Acute hypoxemic respiratory failure due to COVID-19 pneumonia: Continue to monitor due to significant symptoms , patient is weaned off oxygen today.   chest physiotherapy, incentive spirometry, deep breathing exercises, sputum induction, mucolytic's and bronchodilators. Supplemental oxygen to keep saturations more than 90%. Covid directed therapy with , steroids, on Solu-Medrol remdesivir, day 4/5 antibiotics, not indicated Due to severity of symptoms, patient will need daily inflammatory markers, chest x-rays, liver function test to monitor and direct COVID-19 therapies. COVID-19 Labs  Recent Labs    01/07/21 2055 01/08/21 0427 01/09/21 0251 01/10/21 0353  DDIMER <0.27 <0.27 <0.27 <0.27  FERRITIN 104  --   --   --   LDH 179  --   --   --   CRP 0.9 2.5* 1.0* 0.7    Chronic diastolic congestive heart failure: Euvolemic.  Continue home dose of Lasix.  Cocaine abuse: Ongoing.  Counseled.  CKD stage IIIa: Chronic and is stable at about baseline.  Continue mobilizing and aggressive chest physiotherapy.   Anticipate discharge home tomorrow if remains stable.   DVT prophylaxis:   Lovenox subcu   Code Status: Full code Family Communication: Daughter on the phone. Disposition Plan: Status is: Inpatient  Remains inpatient appropriate because:Inpatient level of care appropriate due to severity of illness  Dispo: The patient is from: Home              Anticipated d/c is to: Home              Patient currently is not medically stable to d/c.   Difficult to place patient No         Consultants:  None  Procedures:  None  Antimicrobials:  Remdesivir 7/21---   Subjective: Patient seen and examined.  Still has nagging cough and some shortness of breath but mostly improved. Objective: Vitals:   01/09/21 0006 01/09/21 0532 01/09/21 1312 01/09/21 2031  BP: 123/76 135/87 134/73 (!) 155/77  Pulse: (!) 55 67 (!) 50 68  Resp: '18 20 15 '$ (!) 21  Temp: 98.1 F (36.7 C) 97.9 F (36.6 C) 98.1 F (36.7 C) 98.1 F (36.7 C)  TempSrc: Oral Oral Oral Oral  SpO2: 96% 97% 97% 100%  Height:        Intake/Output Summary (Last 24 hours) at 01/10/2021 1054 Last data filed at 01/09/2021 1500 Gross per 24 hour  Intake 240 ml  Output --  Net 240 ml   There were no vitals filed for this visit.  Examination:  General exam: Appears mildly anxious.  Comfortable at rest. Respiratory system: Bilateral clear.  No added sounds. Cardiovascular system: S1 & S2 heard, RRR.  No edema.  Gastrointestinal system: Soft and nontender.   Central nervous system: Alert and oriented. No focal neurological deficits. Extremities: Symmetric 5 x 5 power. Skin: No rashes, lesions or ulcers Psychiatry: Judgement and insight appear normal. Mood & affect appropriate.   Data Reviewed: I have personally reviewed following labs and imaging studies  CBC: Recent Labs  Lab 01/07/21 1224 01/07/21 2055 01/08/21 0427 01/09/21 0251 01/10/21 0353  WBC 4.6 4.5 4.2 4.6 7.9  NEUTROABS  --  2.5 3.0 3.4 6.4  HGB 14.2  14.5 14.2 13.9 14.0  HCT 45.6 45.7 44.7 43.1 43.2  MCV 92.3 92.3 92.4 90.5 91.1  PLT 246 219 221 252 [id number]   Basic Metabolic Panel: Recent Labs  Lab 01/07/21 1224 01/07/21 2055 01/08/21 0427 01/09/21 0251 01/10/21 0353  NA 141 142 138 136 137  K 3.7 4.0 4.5 4.1 4.0  CL 107 107 108 106 105  CO2 '26 25 23 22 23  '$ GLUCOSE 100* 91 135* 134* 147*  BUN 22* 24* 29* 39* 43*  CREATININE 1.68* 1.56* 1.66* 2.03* 1.96*  CALCIUM 9.2 9.2 9.2 9.2 9.0   GFR: Estimated Creatinine Clearance: 38 mL/min (A) (by C-G formula based on SCr of 1.96 mg/dL (H)). Liver Function Tests: Recent Labs  Lab 01/07/21 2055 01/08/21 0427 01/09/21 0251 01/10/21 0353  AST '17 16 16 16  '$ ALT '16 15 14 15  '$ ALKPHOS 103 96 93 82  BILITOT 0.7 0.6 0.6 0.3  PROT 7.7 7.1 7.0 6.9  ALBUMIN 4.1 3.8 3.7 3.7   No results for input(s): LIPASE, AMYLASE in the last 168 hours. No results for input(s): AMMONIA in the last 168 hours. Coagulation Profile: No results for input(s): INR, PROTIME in the last 168 hours. Cardiac Enzymes: No results for input(s): CKTOTAL, CKMB, CKMBINDEX, TROPONINI in the last 168 hours. BNP (last 3 results) No results for input(s): PROBNP in the last 8760 hours. HbA1C: No results for input(s): HGBA1C in the last 72 hours. CBG: Recent Labs  Lab 01/09/21 0725 01/09/21 1130 01/09/21 1620 01/09/21 2025 01/10/21 0737  GLUCAP 107* 207* 219* 197* 126*   Lipid Profile: Recent Labs    01/07/21 2055  TRIG 88   Thyroid Function Tests: No results for input(s): TSH, T4TOTAL, FREET4, T3FREE, THYROIDAB in the last 72 hours. Anemia Panel: Recent Labs    01/07/21 2055  FERRITIN 104   Sepsis Labs: Recent Labs  Lab 01/07/21 2055 01/07/21 2330  PROCALCITON <0.10 <0.10  LATICACIDVEN 1.2 0.8    Recent Results (from the past 240 hour(s))  Blood Culture (routine x 2)     Status: None (Preliminary result)   Collection Time: 01/07/21  7:24 PM   Specimen: BLOOD  Result Value Ref Range Status    Specimen Description   Final    BLOOD LEFT ANTECUBITAL Performed at [facility], [place] [address] [place], [place] [address]    Special Requests   Final    BOTTLES DRAWN AEROBIC AND ANAEROBIC Blood Culture results may not be optimal due to an inadequate volume of blood received in culture bottles Performed at [place] [address] [place], [place] [address]    Culture   Final    NO GROWTH 3 DAYS Performed at [facility], [place] [address], [place], [place] [address]    Report Status PENDING  Incomplete  Blood Culture (routine x 2)     Status: None (Preliminary result)   Collection Time: 01/07/21  7:29 PM   Specimen: BLOOD  Result  Value Ref Range Status   Specimen Description   Final    BLOOD RIGHT ANTECUBITAL Performed at [place] [address] [place], [place] [address]    Special Requests   Final    BOTTLES DRAWN AEROBIC AND ANAEROBIC Blood Culture adequate volume Performed at [place] [address] [place], [place] [address]    Culture   Final    NO GROWTH 2 DAYS Performed at [place] [address], [place], [place] [address]    Report Status PENDING  Incomplete         Radiology Studies: No results found.      Scheduled Meds:  amLODipine  10 mg Oral QHS   atorvastatin  20 mg Oral QHS   buPROPion  150 mg Oral QHS   cloNIDine  0.2 mg Oral Daily   enoxaparin (LOVENOX) injection  55 mg Subcutaneous Q24H   furosemide  40 mg Oral BID   hydrALAZINE  75 mg Oral TID   methylPREDNISolone (SOLU-MEDROL) injection  0.5 mg/kg Intravenous Q12H   Followed by   Derrill Memo ON 01/11/2021] predniSONE  50 mg Oral Daily   pantoprazole  40 mg Oral Daily   Continuous Infusions:  remdesivir 100 mg in NS 100 mL 100 mg (01/09/21 1127)     LOS: 3 days    Time spent: 30 minutes    Barb Merino, MD [facility] Pager [phone]

## 2021-01-11 ENCOUNTER — Other Ambulatory Visit: Payer: Self-pay

## 2021-01-11 LAB — CBC WITH DIFFERENTIAL/PLATELET
Abs Immature Granulocytes: 0.12 10*3/uL — ABNORMAL HIGH (ref 0.00–0.07)
Basophils Absolute: 0 10*3/uL (ref 0.0–0.1)
Basophils Relative: 0 %
Eosinophils Absolute: 0 10*3/uL (ref 0.0–0.5)
Eosinophils Relative: 0 %
HCT: 43 % (ref 36.0–46.0)
Hemoglobin: 13.8 g/dL (ref 12.0–15.0)
Immature Granulocytes: 2 %
Lymphocytes Relative: 12 %
Lymphs Abs: 1 10*3/uL (ref 0.7–4.0)
MCH: 29.1 pg (ref 26.0–34.0)
MCHC: 32.1 g/dL (ref 30.0–36.0)
MCV: 90.7 fL (ref 80.0–100.0)
Monocytes Absolute: 0.2 10*3/uL (ref 0.1–1.0)
Monocytes Relative: 3 %
Neutro Abs: 6.9 10*3/uL (ref 1.7–7.7)
Neutrophils Relative %: 83 %
Platelets: 258 10*3/uL (ref 150–400)
RBC: 4.74 MIL/uL (ref 3.87–5.11)
RDW: 15.9 % — ABNORMAL HIGH (ref 11.5–15.5)
WBC: 8.2 10*3/uL (ref 4.0–10.5)
nRBC: 0 % (ref 0.0–0.2)

## 2021-01-11 LAB — COMPREHENSIVE METABOLIC PANEL
ALT: 16 U/L (ref 0–44)
AST: 15 U/L (ref 15–41)
Albumin: 3.5 g/dL (ref 3.5–5.0)
Alkaline Phosphatase: 85 U/L (ref 38–126)
Anion gap: 9 (ref 5–15)
BUN: 44 mg/dL — ABNORMAL HIGH (ref 6–20)
CO2: 24 mmol/L (ref 22–32)
Calcium: 8.5 mg/dL — ABNORMAL LOW (ref 8.9–10.3)
Chloride: 103 mmol/L (ref 98–111)
Creatinine, Ser: 1.8 mg/dL — ABNORMAL HIGH (ref 0.44–1.00)
GFR, Estimated: 32 mL/min — ABNORMAL LOW (ref >60)
Glucose, Bld: 156 mg/dL — ABNORMAL HIGH (ref 70–99)
Potassium: 3.5 mmol/L (ref 3.5–5.1)
Sodium: 136 mmol/L (ref 135–145)
Total Bilirubin: 0.4 mg/dL (ref 0.3–1.2)
Total Protein: 6.5 g/dL (ref 6.5–8.1)

## 2021-01-11 LAB — GLUCOSE, CAPILLARY
Glucose-Capillary: 127 mg/dL — ABNORMAL HIGH (ref 70–99)
Glucose-Capillary: 128 mg/dL — ABNORMAL HIGH (ref 70–99)

## 2021-01-11 LAB — D-DIMER, QUANTITATIVE: D-Dimer, Quant: <0.27 ug/mL-FEU (ref 0.00–0.50)

## 2021-01-11 LAB — C-REACTIVE PROTEIN: CRP: 0.6 mg/dL (ref <1.0)

## 2021-01-11 MED ORDER — PREDNISONE 20 MG PO TABS
40.0000 mg | ORAL_TABLET | Freq: Every day | ORAL | 0 refills | Status: AC
  Filled 2021-01-11: qty 10, 5d supply, fill #0

## 2021-01-11 NOTE — TOC Progression Note (Addendum)
Transition of Care [facility]) - Progression Note    Patient Details  Name: Angela Gardner MRN: [id number] Date of Birth: [birth year]  Transition of Care [facility]) CM/SW Contact  Purcell Mouton, RN Phone Number: 01/11/2021, 12:33 PM  Clinical Narrative:    Pt from home and plan to return home with [facility]. Checking HH Agencies now. Alvis Lemmings accepted pt for [facility].    Expected Discharge Plan: [place] Barriers to Discharge: No Barriers Identified  Expected Discharge Plan and Services Expected Discharge Plan: [place] arrangements for the past 2 months: Single Family Home Expected Discharge Date: 01/11/21                         [facility] Arranged: PT           Social Determinants of Health (SDOH) Interventions    Readmission Risk Interventions No flowsheet data found.

## 2021-01-11 NOTE — Discharge Summary (Signed)
Physician Discharge Summary  Angela Gardner [id number] DOB: Jul 14, 1960 DOA: 01/07/2021  PCP: Charlott Rakes, MD  Admit date: 01/07/2021 Discharge date: 01/11/2021  Admitted From: Home Disposition: Home with home health PT  Recommendations for Outpatient Follow-up:  Follow up with PCP in 1-2 weeks Please obtain BMP/CBC in one week   Home Health: Physical therapy Equipment/Devices: None  Discharge Condition: Stable CODE STATUS: Full code Diet recommendation: Low-salt diet  Discharge summary: 60 year old female with history of diastolic congestive heart failure, ongoing cocaine use, hypertension, CKD stage IIIa, unvaccinated against COVID-19 presented to the emergency room with 5 days of progressively worsening shortness of breath.  In the emergency room she was 88% on room air.  Chest x-ray was essentially normal.  [facility]  Patient required admission due to significant symptoms and multiple comorbidities for treatment of COVID-19 pneumonia.     Acute hypoxemic respiratory failure due to COVID-19 pneumonia: Patient was treated with 5 days of remdesivir therapy, IV steroids and bronchodilator therapy along with aggressive chest physiotherapy.  Due to appropriate clinical response.  Today, she is on room air and most of the symptoms have improved. She will do 5 more days of oral prednisone therapy.  She will continue to do chest physiotherapy and exercises at home.  She will use over-the-counter cough medications and Tylenol.  Isolation precautions advised for 10 days since initial diagnosis.   Chronic diastolic congestive heart failure: Euvolemic.  Continue home dose of Lasix.  Cocaine abuse: Ongoing.  Counseled.  CKD stage IIIa: Chronic and is stable at about baseline.  Essential hypertension blood pressure fairly stable on multiple regimens including clonidine, hydralazine and amlodipine.  Resume on discharge.   Patient fairly stabilized today and able to go  home.  Patient is fairly deconditioned, will benefit with therapies at home.  She does have adequate support at home to go home.  Discharge Diagnoses:  Principal Problem:   Acute hypoxemic respiratory failure due to COVID-19 [facility]) Active Problems:   Essential hypertension   Diastolic congestive heart failure, NYHA class 3 (HCC)   Chronic kidney disease (CKD), stage III (moderate) (HCC)   Cocaine abuse [facility])    Discharge Instructions  Discharge Instructions     Call MD for:  difficulty breathing, headache or visual disturbances   Complete by: As directed    Diet - low sodium heart healthy   Complete by: As directed    Discharge instructions   Complete by: As directed    Isolation precautions for total 10 days from your diagnosis until 01/16/2021   Increase activity slowly   Complete by: As directed       Allergies as of 01/11/2021       Reactions   Vicodin [hydrocodone-acetaminophen] Itching        Medication List     STOP taking these medications    OVER THE COUNTER MEDICATION       TAKE these medications    acetaminophen 160 MG/5ML liquid Commonly known as: TYLENOL Take 325 mg by mouth every 4 (four) hours as needed for fever.   amLODipine 10 MG tablet Commonly known as: NORVASC Take 1 tablet (10 mg total) by mouth daily.   atorvastatin 40 MG tablet Commonly known as: LIPITOR Take 0.5 tablets (20 mg total) by mouth daily.   buPROPion 150 MG 24 hr tablet Commonly known as: Wellbutrin XL Take 1 tablet (150 mg total) by mouth daily. For smoking cessation   cloNIDine 0.2 MG tablet Commonly known as: CATAPRES  TAKE 1 TABLET (0.2 MG TOTAL) BY MOUTH AT BEDTIME. What changed:  how much to take how to take this when to take this   diphenhydrAMINE 25 mg capsule Commonly known as: BENADRYL Take 25 mg by mouth every 6 (six) hours as needed for itching.   furosemide 40 MG tablet Commonly known as: LASIX Take 1 tablet (40 mg total) by mouth 2 (two) times  daily.   hydrALAZINE 50 MG tablet Commonly known as: APRESOLINE Take 1.5 tablets (75 mg total) by mouth 3 (three) times daily.   pantoprazole 40 MG tablet Commonly known as: PROTONIX TAKE 1 TABLET (40 MG TOTAL) BY MOUTH DAILY. What changed: how much to take   predniSONE 20 MG tablet Commonly known as: DELTASONE Take 2 tablets (40 mg total) by mouth daily for 5 days.        Follow-up Information     Charlott Rakes, MD Follow up in 2 week(s).   Specialty: Family Medicine Contact information: [place] [address] [phone]         [facility] Follow up.   Specialty: Home Health Services Why: Will follow you at home for [place] information: [address] [place] [address] [phone]                Allergies  Allergen Reactions   Vicodin [Hydrocodone-Acetaminophen] Itching    Consultations: None   Procedures/Studies: DG Chest 2 View  Result Date: 01/07/2021 CLINICAL DATA:  Shortness of breath and COVID-19 positivity EXAM: CHEST - 2 VIEW COMPARISON:  01/02/2021 FINDINGS: The heart size and mediastinal contours are within normal limits. Both lungs are clear. The visualized skeletal structures are unremarkable. IMPRESSION: No active cardiopulmonary disease. Electronically Signed   By: Inez Catalina M.D.   On: 01/07/2021 13:14   DG Chest Port 1 View  Result Date: 01/02/2021 CLINICAL DATA:  Chest pain and shortness of breath. EXAM: PORTABLE CHEST 1 VIEW COMPARISON:  12/19/2019 and older studies. FINDINGS: Mild enlargement of the cardiopericardial silhouette, stable from the prior study. No mediastinal or hilar masses. Lungs are clear. No convincing pleural effusion and no pneumothorax. Skeletal structures are grossly intact. IMPRESSION: 1. No acute cardiopulmonary disease. 2. Stable cardiomegaly. Electronically Signed   By: Lajean Manes M.D.   On: 01/02/2021 17:45   (Echo, Carotid, EGD,  Colonoscopy, ERCP)    Subjective: Patient seen and examined.  No overnight events.  Currently on room air.  Occasionally coughing spells otherwise denies any shortness of breath.  Still has mild rib pain on coughing but mostly improved with than before.  Remains afebrile.   Discharge Exam: Vitals:   01/11/21 0524 01/11/21 1207  BP: 122/65 (!) 142/84  Pulse: (!) 51 60  Resp: 17 18  Temp: 97.8 F (36.6 C) 98 F (36.7 C)  SpO2: 98% 93%   Vitals:   01/10/21 1209 01/10/21 2145 01/11/21 0524 01/11/21 1207  BP: 138/68 (!) 151/71 122/65 (!) 142/84  Pulse: (!) 51 69 (!) 51 60  Resp: '20 19 17 18  '$ Temp: 97.7 F (36.5 C) 97.9 F (36.6 C) 97.8 F (36.6 C) 98 F (36.7 C)  TempSrc: Oral Oral Oral Oral  SpO2: 98% 96% 98% 93%  Height:        General: Pt is alert, awake, not in acute distress Comfortable on room air. Cardiovascular: RRR, S1/S2 +, no rubs, no gallops Respiratory: CTA bilaterally, no wheezing, no rhonchi Abdominal: Soft, NT, ND, bowel sounds + Extremities:  no edema, no cyanosis    The results of significant diagnostics from this hospitalization (including imaging, microbiology, ancillary and laboratory) are listed below for reference.     Microbiology: Recent Results (from the past 240 hour(s))  Blood Culture (routine x 2)     Status: None (Preliminary result)   Collection Time: 01/07/21  7:24 PM   Specimen: BLOOD  Result Value Ref Range Status   Specimen Description   Final    BLOOD LEFT ANTECUBITAL Performed at [place] [address], [place], [place] [address]    Special Requests   Final    BOTTLES DRAWN AEROBIC AND ANAEROBIC Blood Culture results may not be optimal due to an inadequate volume of blood received in culture bottles Performed at [place] [address], [place], [place] [address]    Culture   Final    NO GROWTH 4 DAYS Performed at [facility], [place] [address] [place], [place] [address]     Report Status PENDING  Incomplete  Blood Culture (routine x 2)     Status: None (Preliminary result)   Collection Time: 01/07/21  7:29 PM   Specimen: BLOOD  Result Value Ref Range Status   Specimen Description   Final    BLOOD RIGHT ANTECUBITAL Performed at [place] [address], [place], [place] [address]    Special Requests   Final    BOTTLES DRAWN AEROBIC AND ANAEROBIC Blood Culture adequate volume Performed at [place] [address] [place], [place] [address]    Culture   Final    NO GROWTH 3 DAYS Performed at [facility], [place] [address] [place],  [address]    Report Status PENDING  Incomplete     Labs: BNP (last 3 results) Recent Labs    01/02/21 1740 01/07/21 1228  BNP 66.7 [id number]   Basic Metabolic Panel: Recent Labs  Lab 01/07/21 2055 01/08/21 0427 01/09/21 0251 01/10/21 0353 01/11/21 0325  NA 142 138 136 137 136  K 4.0 4.5 4.1 4.0 3.5  CL 107 108 106 105 103  CO2 '25 23 22 23 24  '$ GLUCOSE 91 135* 134* 147* 156*  BUN 24* 29* 39* 43* 44*  CREATININE 1.56* 1.66* 2.03* 1.96* 1.80*  CALCIUM 9.2 9.2 9.2 9.0 8.5*   Liver Function Tests: Recent Labs  Lab 01/07/21 2055 01/08/21 0427 01/09/21 0251 01/10/21 0353 01/11/21 0325  AST '17 16 16 16 15  '$ ALT '16 15 14 15 16  '$ ALKPHOS 103 96 93 82 85  BILITOT 0.7 0.6 0.6 0.3 0.4  PROT 7.7 7.1 7.0 6.9 6.5  ALBUMIN 4.1 3.8 3.7 3.7 3.5   No results for input(s): LIPASE, AMYLASE in the last 168 hours. No results for input(s): AMMONIA in the last 168 hours. CBC: Recent Labs  Lab 01/07/21 2055 01/08/21 0427 01/09/21 0251 01/10/21 0353 01/11/21 0325  WBC 4.5 4.2 4.6 7.9 8.2  NEUTROABS 2.5 3.0 3.4 6.4 6.9  HGB 14.5 14.2 13.9 14.0 13.8  HCT 45.7 44.7 43.1 43.2 43.0  MCV 92.3 92.4 90.5 91.1 90.7  PLT 219 221 252 250 258   Cardiac Enzymes: No results for input(s): CKTOTAL, CKMB, CKMBINDEX, TROPONINI in the last 168 hours. BNP: Invalid input(s):  POCBNP CBG: Recent Labs  Lab 01/10/21 1129 01/10/21 1618 01/10/21 2142 01/11/21 0804 01/11/21 1208  GLUCAP 208* 113* 219* 127* 128*   D-Dimer Recent Labs    01/10/21 0353 01/11/21 0325  DDIMER <0.27 <0.27   Hgb A1c No results for input(s): HGBA1C in the last 72 hours. Lipid Profile No results for input(s): CHOL, HDL, LDLCALC, TRIG, CHOLHDL, LDLDIRECT in the last 72 hours. Thyroid function studies No results for input(s): TSH, T4TOTAL, T3FREE, THYROIDAB in the last 72 hours.  Invalid input(s): FREET3 Anemia work up No results for input(s): VITAMINB12, FOLATE, FERRITIN, TIBC, IRON, RETICCTPCT in the last 72 hours. Urinalysis    Component Value Date/Time   COLORURINE YELLOW 12/22/2017 0808   APPEARANCEUR CLEAR 12/22/2017 0808   LABSPEC 1.014 12/22/2017 0808   PHURINE 5.0 12/22/2017 0808   GLUCOSEU NEGATIVE 12/22/2017 0808   HGBUR NEGATIVE 12/22/2017 0808   BILIRUBINUR NEGATIVE 12/22/2017 0808   KETONESUR NEGATIVE 12/22/2017 0808   PROTEINUR 30 (A) 12/22/2017 0808   UROBILINOGEN 0.2 12/02/2011 1924   NITRITE NEGATIVE 12/22/2017 0808   LEUKOCYTESUR TRACE (A) 12/22/2017 0808   Sepsis Labs Invalid input(s): PROCALCITONIN,  WBC,  LACTICIDVEN Microbiology Recent Results (from the past 240 hour(s))  Blood Culture (routine x 2)     Status: None (Preliminary result)   Collection Time: 01/07/21  7:24 PM   Specimen: BLOOD  Result Value Ref Range Status   Specimen Description   Final    BLOOD LEFT ANTECUBITAL Performed at [facility], [place] [address] [place], [place] [address]    Special Requests   Final    BOTTLES DRAWN AEROBIC AND ANAEROBIC Blood Culture results may not be optimal due to an inadequate volume of blood received in culture bottles Performed at [place] [address], [place], [place] [address]    Culture   Final    NO GROWTH 4 DAYS Performed at [facility], [place] [address], [place], [place] [address]     Report Status PENDING  Incomplete  Blood Culture (routine x 2)     Status: None (Preliminary result)   Collection Time: 01/07/21  7:29 PM   Specimen: BLOOD  Result Value Ref Range Status   Specimen Description   Final    BLOOD RIGHT ANTECUBITAL Performed at [place] [address], [place], [place] [address]    Special Requests   Final    BOTTLES DRAWN AEROBIC AND ANAEROBIC Blood Culture adequate volume Performed at [place] [address] [place], [place] [address]    Culture   Final    NO GROWTH 3 DAYS Performed at [facility], [place] [address], [place], [place] [address]    Report Status PENDING  Incomplete     Time coordinating discharge: 32 minutes  SIGNED:   Barb Merino, MD  [facility] 01/11/2021, 1:43 PM

## 2021-01-12 ENCOUNTER — Telehealth: Payer: Self-pay

## 2021-01-12 LAB — CULTURE, BLOOD (ROUTINE X 2): Culture: NO GROWTH

## 2021-01-12 NOTE — Telephone Encounter (Signed)
  Transition Care Management Follow-up Telephone   Date of discharge and from where: [facility] on 07.25.2022 How have you been since you were released from the hospital? Doing better  Any questions or concerns? No questions/concerns reported.  Items Reviewed: Did the pt receive and understand the discharge instructions provided? have the instructions and have no questions.  Medications obtained and verified? She said that she have the medication list  and the hospital staff reviewed them in detail prior to discharge. She ask if possible for DR Margarita Rana to prescribe her Wellbutrin XL med to pharmacy   Any new allergies since your discharge? None reported  Do you have support at home? Lives alone , but her sister is helping her when needed Other (ie: DME, Home Health, etc)       NONE Functional Questionnaire: (I = Independent and D = Dependent) ADL's:  Independent.        Follow up appointments reviewed:   PCP Hospital f/u appt confirmed? Scheduled MyChart  MD [place] @ 1050. [facility] f/u appt confirmed? None scheduled at this time  Are transportation arrangements needed? have transportation   If their condition worsens, is the pt aware to call  their PCP or go to the ED? Yes.Made pt aware if condition worsen or start experiencing rapid weight gain, chest pain, diff breathing, SOB, high fevers, or bleading to refer imediately to ED for further evaluation.  Was the patient provided with contact information for the PCP's office or ED? He has the phone number  Was the pt encouraged to call back with questions or concerns?yes

## 2021-01-13 ENCOUNTER — Other Ambulatory Visit: Payer: Self-pay

## 2021-01-13 LAB — CULTURE, BLOOD (ROUTINE X 2)
Culture: NO GROWTH
Special Requests: ADEQUATE

## 2021-01-13 NOTE — Telephone Encounter (Signed)
Her chart reveals she has 3 refills of Wellbutrin at [facility] and [facility].

## 2021-01-13 NOTE — Telephone Encounter (Signed)
Called pt made aware pending RX at the pharmacy.

## 2021-01-20 ENCOUNTER — Inpatient Hospital Stay: Admission: RE | Admit: 2021-01-20 | Payer: Medicare Other | Source: Ambulatory Visit

## 2021-01-28 ENCOUNTER — Ambulatory Visit: Payer: Medicare Other | Attending: Family Medicine | Admitting: Family Medicine

## 2021-01-28 ENCOUNTER — Encounter: Payer: Self-pay | Admitting: Family Medicine

## 2021-01-28 ENCOUNTER — Other Ambulatory Visit: Payer: Self-pay

## 2021-01-28 DIAGNOSIS — K0510 Chronic gingivitis, plaque induced: Secondary | ICD-10-CM | POA: Diagnosis not present

## 2021-01-28 DIAGNOSIS — Z8616 Personal history of COVID-19: Secondary | ICD-10-CM | POA: Diagnosis not present

## 2021-01-28 MED ORDER — LIDOCAINE VISCOUS HCL 2 % MT SOLN
15.0000 mL | OROMUCOSAL | 1 refills | Status: DC | PRN
  Filled 2021-01-28: qty 100, 7d supply, fill #0

## 2021-01-28 NOTE — Progress Notes (Signed)
Virtual Visit via Telephone Note  I connected with Angela Gardner, on 01/28/2021 at 10:56 AM by telephone due to the COVID-19 pandemic and verified that I am speaking with the correct person using two identifiers.   Consent: I discussed the limitations, risks, security and privacy concerns of performing an evaluation and management service by telephone and the availability of in person appointments. I also discussed with the patient that there may be a patient responsible charge related to this service. The patient expressed understanding and agreed to proceed.   Location of Patient: Home  Location of Provider: Clinic   Persons participating in Telemedicine visit: Angela Gardner Dr. Margarita Rana     History of Present Illness: Angela Gardner is a 60 y.o. year old female with a history of hypertension, stage III chronic kidney disease, diastolic CHF (EF 60 to [id number] from 07/2018), Endometrial carcinoma diagnosed in 09/2017 s/p total abdominal hysterectomy and bilateral salpingo-oophorectomy on 11/14/2017, status post radiation and chemotherapy who presents today for follow-up visit.  She was hospitalized at [facility] long hospital from 01/07/2021 through 01/11/2021 for acute respiratory failure secondary to COVID-19.  Treated with remdesivir and prednisone with improvement in symptoms. She feels fine and has no residual symptoms; has completed her course of isolation.  She is still not vaccinated.  Complains of sore gum, sensitive teeth like she had after she had completed radiation and is requesting a refill of viscous lidocaine. She did have a visit for follow-up of her chronic medical conditions 2 months ago. Past Medical History:  Diagnosis Date   Acute bronchitis 12/04/2008   Qualifier: Diagnosis of  By: Tomma Lightning MD, Claiborne Billings     Acute CHF (congestive heart failure) ([place]) 09/12/2017   Acute exacerbation of CHF (congestive heart failure) ([place]) 09/13/2017   Anemia    Antineoplastic chemotherapy  induced anemia 04/05/2018   Cancer ([place])    left ductal papilloma   Chest discomfort 09/12/2017   Negative stress test in May 2019   Chest pain 12/20/2019   CHF (congestive heart failure) (HCC)    Chronic kidney disease (CKD), stage III (moderate) 10/30/2017   Deficiency anemia 12/06/2017   Depression [id number]   Diastolic congestive heart failure, NYHA class 3 ([place]) 10/27/2017   Diffuse pain 01/04/2018   Dysfunctional uterine bleeding 09/12/2017   Dyspnea    still having this and not moving around alot -gets worse with exertion   Endometrial cancer ([place]) 10/19/2017   Essential hypertension 10/27/2017   History of sleep walking    Hx of left breast biopsy    Hyperlipemia    Hypertension    not on any medication now-[facility] had  prescribed her a med and she stopped taking them cannot remember when   Hypertensive crisis [id number]   Illicit drug use [id number]   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   Left knee pain    Mild renal insufficiency 09/12/2017   Morbid obesity ([place]) 10/09/2017   Other constipation 11/28/2017   OTITIS MEDIA 12/04/2008   Qualifier: Diagnosis of  By: Tomma Lightning MD, Claiborne Billings     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     amLODipine (NORVASC) 10 MG tablet Take 1 tablet (10 mg total) by mouth daily. 90 tablet 1   atorvastatin (LIPITOR) 40 MG tablet Take 0.5 tablets (20 mg total) by mouth daily. 90 tablet 1   buPROPion (WELLBUTRIN XL) 150 MG 24 hr tablet  Take 1 tablet (150 mg total) by mouth daily. For smoking cessation 30 tablet 3   cloNIDine (CATAPRES) 0.2 MG tablet TAKE 1 TABLET (0.2 MG TOTAL) BY MOUTH AT BEDTIME. 90 tablet 3   diphenhydrAMINE (BENADRYL) 25 mg capsule Take 25 mg by mouth every 6 (six) hours as needed for itching.     furosemide (LASIX) 40 MG tablet Take 1 tablet (40 mg total) by mouth 2 (two) times daily. 180 tablet 1   hydrALAZINE (APRESOLINE) 50 MG tablet Take 1.5 tablets (75 mg total) by mouth 3 (three) times daily. 270 tablet 1   pantoprazole (PROTONIX) 40 MG tablet TAKE 1 TABLET (40 MG TOTAL) BY MOUTH DAILY. 60 tablet 0   [DISCONTINUED] prochlorperazine (COMPAZINE) 10 MG tablet Take 1 tablet (10 mg total) by mouth every 6 (six) hours as needed (Nausea or vomiting). (Patient not taking: Reported on 05/25/2018) 30 tablet 1   No current facility-administered medications on file prior to visit.    ROS: See HPI  Observations/Objective: Awake, alert, ranted x3 Not in acute distress Normal mood  CMP Latest Ref Rng & Units 01/11/2021 01/10/2021 01/09/2021  Glucose 70 - 99 mg/dL 156(H) 147(H) 134(H)  BUN 6 - 20 mg/dL 44(H) 43(H) 39(H)  Creatinine 0.44 - 1.00 mg/dL 1.80(H) 1.96(H) 2.03(H)  Sodium 135 - 145 mmol/L 136 137 136  Potassium 3.5 - 5.1 mmol/L 3.5 4.0 4.1  Chloride 98 - 111 mmol/L 103 105 106  CO2 22 - 32 mmol/L '24 23 22  '$ Calcium 8.9 - 10.3 mg/dL 8.5(L) 9.0 9.2  Total Protein 6.5 - 8.1 g/dL 6.5 6.9 7.0  Total Bilirubin 0.3 - 1.2 mg/dL 0.4 0.3 0.6  Alkaline Phos 38 - 126 U/L 85 82 93  AST 15 - 41 U/L '15 16 16  '$ ALT 0 - 44 U/L '16 15 14    '$ Lipid Panel     Component Value Date/Time   CHOL 248 (H) 07/02/2019 1135   TRIG 88 01/07/2021 2055   HDL 78 07/02/2019 1135   CHOLHDL 3.2 07/02/2019 1135   CHOLHDL 3.4 Ratio 03/30/2009 2039   VLDL 28 03/30/2009 2039   LDLCALC 155 (H) 07/02/2019 1135   LABVLDL 15 07/02/2019 1135    Lab Results  Component Value Date   HGBA1C 6.0 07/03/2018    Assessment and  Plan: 1. Gingivostomatitis Placed on viscous lidocaine per request If symptoms persist consider Valtrex  2. History of COVID-19 Treated with remdesivir and steroids She has improved Encouraged to receive the COVID-19 vaccine at her local pharmacy and she promises to follow through with this   Follow Up Instructions: Keep previously scheduled appointment   I discussed the assessment and treatment plan with the patient. The patient was provided an opportunity to ask questions and all were answered. The patient agreed with the plan and demonstrated an understanding of the instructions.   The patient was advised to call back or seek an in-person evaluation if the symptoms worsen or if the condition fails to improve as anticipated.     I provided 12 minutes total of non-face-to-face time during this encounter.  Charlott Rakes, MD, FAAFP. [facility], [place]   01/28/2021, 10:56 AM

## 2021-01-29 ENCOUNTER — Ambulatory Visit: Payer: Medicare Other

## 2021-01-29 ENCOUNTER — Other Ambulatory Visit: Payer: Self-pay

## 2021-02-05 ENCOUNTER — Ambulatory Visit: Payer: Medicare Other

## 2021-03-23 ENCOUNTER — Encounter: Payer: Self-pay | Admitting: Radiation Oncology

## 2021-03-30 ENCOUNTER — Encounter: Payer: Self-pay | Admitting: Internal Medicine

## 2021-04-01 ENCOUNTER — Telehealth: Payer: Self-pay | Admitting: *Deleted

## 2021-04-01 ENCOUNTER — Ambulatory Visit
Admission: RE | Admit: 2021-04-01 | Discharge: 2021-04-01 | Disposition: A | Discharge status: Home / Self Care | Payer: Medicare Other | Source: Ambulatory Visit | Attending: Radiation Oncology | Admitting: Radiation Oncology

## 2021-04-01 HISTORY — DX: Personal history of irradiation: Z92.3

## 2021-04-01 NOTE — Telephone Encounter (Signed)
CALLED PATIENT TO  ASK ABOUT RESCHEDULING MISSED FU FOR 04-01-21, PATIENT AGREED TO COME ON 04-05-21 @ 11 AM

## 2021-04-03 NOTE — Progress Notes (Signed)
Radiation Oncology         [phone] ________________________________  Name: Angela Gardner MRN: [id number]  Date: 04/05/2021  DOB: 08/02/1960  Follow-Up Visit Note  CC: Charlott Rakes, MD  Everitt Amber, MD    ICD-10-CM   1. 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    Since her last visit, the patient has had several hospital encounters, detailed as follows:  -- [facility] urgent care on 05/15/20: patient presented with pain and swelling in her right thumb for 2-3 days and was found to have an infection around her nail bed. Prescribed keflex with resolution of infection. --[facility] ED on 01/02/21: patient presented with intermittent throbbing left upper chest pain for 2 weeks which radiated into her left arm/shoulder and associated chronic SOB. Chest XR performed showed no signs of CHF. BNP collected showed the patients creatinine to have slightly worsened from baseline. The patient was informed that Lasix may have attributed to her worsening kidney function. Given that the patient had no signs of fluid overload, she was advised to cut back on her twice a day Lasix and follow up with her PCP. (Patient was noted to still be smoking).  --Admission to [facility] on 01/07/21: patient presented to the ED with 5 days of progressively worsening SOB. In the ED, she was 88% on room air.  Chest x-ray performed was essentially normal.  Patient tested positive for COVID-19 and required admission due to significant symptoms and multiple comorbidities for treatment of acute hypoxemic respiratory failure due to COVID-19  pneumonia. Patient was treated with 5 days of remdesivir therapy, IV steroids and bronchodilator therapy along with aggressive chest physiotherapy. The patient was discharged on 01/09/21 and sent home to complete 5 more days of oral prednisone therapy. The patient was counseled for her ongoing cocaine abuse during this encounter.   In the interval, the patient additionally followed up with her cardiologist, Dr. Agustin Cree, on 09/25/20 in regards to her essential hypertension (noted as difficult to manage due to the patients non compliance), diastolic congestive heart failure, dyslipidemia, and tobacco abuse. The patient denied any symptoms at the time other than ongoing chronic SOB. The patient was noted to not be diligent in taking her blood pressure medication; her cardiologist informed her that this could be detrimental to her health in the long run. She was again counseled on drug cessation during this visit.        Patient denies any pelvic pain or abdominal bloating.  She denies any vaginal bleeding or discharge.  She denies any hematuria or rectal bleeding.  She is no longer using her vaginal dilator given the interval since treatment.      She reports having some pain in her right upper arm related to a fall.  She does not wish to consider imaging of this area as this problem is getting better.                 Allergies:  is allergic to vicodin [hydrocodone-acetaminophen].  Meds: Current Outpatient Medications  Medication Sig Dispense Refill   acetaminophen (TYLENOL) 160  MG/5ML liquid Take 325 mg by mouth every 4 (four) hours as needed for fever.     amLODipine (NORVASC) 10 MG tablet Take 1 tablet (10 mg total) by mouth daily. 90 tablet 1   atorvastatin (LIPITOR) 40 MG tablet Take 0.5 tablets (20 mg total) by mouth daily. 90 tablet 1   buPROPion (WELLBUTRIN XL) 150 MG 24 hr tablet Take 1 tablet (150 mg total) by mouth daily. For smoking cessation 30 tablet 3   cloNIDine (CATAPRES) 0.2 MG  tablet TAKE 1 TABLET (0.2 MG TOTAL) BY MOUTH AT BEDTIME. 90 tablet 3   diphenhydrAMINE (BENADRYL) 25 mg capsule Take 25 mg by mouth every 6 (six) hours as needed for itching.     furosemide (LASIX) 40 MG tablet Take 1 tablet (40 mg total) by mouth 2 (two) times daily. 180 tablet 1   hydrALAZINE (APRESOLINE) 50 MG tablet Take 1.5 tablets (75 mg total) by mouth 3 (three) times daily. 270 tablet 1   lidocaine (XYLOCAINE) 2 % solution Use as directed 15 mLs in the mouth or throat as needed for mouth pain. 100 mL 1   pantoprazole (PROTONIX) 40 MG tablet TAKE 1 TABLET (40 MG TOTAL) BY MOUTH DAILY. 60 tablet 0   No current facility-administered medications for this encounter.    Physical Findings: The patient is in no acute distress. Patient is alert and oriented.  height is 5\' 4"  (1.626 m) and weight is 228 lb 3.2 oz (103.5 kg). Her temperature is 97.6 F (36.4 C). Her blood pressure is 156/96 (abnormal) and her pulse is 95. Her respiration is 18 and oxygen saturation is 100%. .   Lungs are clear to auscultation bilaterally. Heart has regular rate and rhythm. No palpable cervical, supraclavicular, or axillary adenopathy. Abdomen soft, non-tender, normal bowel sounds.  On pelvic examination the external genitalia were unremarkable. A speculum exam was performed. There are no mucosal lesions noted in the vaginal vault. . On bimanual examination there were no pelvic masses appreciated.  Vaginal cuff intact.    Lab Findings: Lab Results  Component Value Date   WBC 8.2 01/11/2021   HGB 13.8 01/11/2021   HCT 43.0 01/11/2021   MCV 90.7 01/11/2021   PLT 258 01/11/2021    Radiographic Findings: No results found.  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  Plan: Patient will follow-up with Dr. Berline Lopes in 6 months.  Radiation oncology in 1 year.   20 minutes of total time was spent for this  patient encounter, including preparation, face-to-face counseling with the patient and coordination of care, physical exam, and documentation of the encounter. ____________________________________  Blair Promise, PhD, MD   This document serves as a record of services personally performed by Gery Pray, MD. It was created on his behalf by Roney Mans, a trained medical scribe. The creation of this record is based on the scribe's personal observations and the provider's statements to them. This document has been checked and approved by the attending provider.

## 2021-04-05 ENCOUNTER — Other Ambulatory Visit: Payer: Self-pay

## 2021-04-05 ENCOUNTER — Encounter: Payer: Self-pay | Admitting: Radiation Oncology

## 2021-04-05 ENCOUNTER — Ambulatory Visit
Admission: RE | Admit: 2021-04-05 | Discharge: 2021-04-05 | Disposition: A | Discharge status: Home / Self Care | Payer: Medicare Other | Source: Ambulatory Visit | Attending: Radiation Oncology | Admitting: Radiation Oncology

## 2021-04-05 DIAGNOSIS — I10 Essential (primary) hypertension: Secondary | ICD-10-CM | POA: Insufficient documentation

## 2021-04-05 DIAGNOSIS — Z79899 Other long term (current) drug therapy: Secondary | ICD-10-CM | POA: Insufficient documentation

## 2021-04-05 DIAGNOSIS — M79621 Pain in right upper arm: Secondary | ICD-10-CM | POA: Insufficient documentation

## 2021-04-05 DIAGNOSIS — I110 Hypertensive heart disease with heart failure: Secondary | ICD-10-CM | POA: Diagnosis not present

## 2021-04-05 DIAGNOSIS — F17210 Nicotine dependence, cigarettes, uncomplicated: Secondary | ICD-10-CM | POA: Diagnosis not present

## 2021-04-05 DIAGNOSIS — E785 Hyperlipidemia, unspecified: Secondary | ICD-10-CM | POA: Insufficient documentation

## 2021-04-05 DIAGNOSIS — Z08 Encounter for follow-up examination after completed treatment for malignant neoplasm: Secondary | ICD-10-CM | POA: Diagnosis not present

## 2021-04-05 DIAGNOSIS — C541 Malignant neoplasm of endometrium: Secondary | ICD-10-CM

## 2021-04-05 DIAGNOSIS — Z923 Personal history of irradiation: Secondary | ICD-10-CM | POA: Insufficient documentation

## 2021-04-05 DIAGNOSIS — Z8542 Personal history of malignant neoplasm of other parts of uterus: Secondary | ICD-10-CM | POA: Insufficient documentation

## 2021-04-05 NOTE — Progress Notes (Signed)
Angela Gardner is here today for follow up post radiation to the pelvic.  They completed their radiation on: 02/21/18  Does the patient complain of any of the following:  Pain:Patient reports having pain to  right arm due fall, rating 10/10.  Abdominal bloating: no Diarrhea/Constipation: constipation  Nausea/Vomiting: no Vaginal Discharge: no Blood in Urine or Stool: no Urinary Issues (dysuria/incomplete emptying/ incontinence/ increased frequency/urgency): no Does patient report using vaginal dilator 2-3 times a week and/or sexually active 2-3 weeks: patient is not using vaginal dilator or being sexually active.  Post radiation skin changes: no   Additional comments if applicable:   Vitals:   04/05/21 1110  BP: (!) 156/96  Pulse: 95  Resp: 18  Temp: 97.6 F (36.4 C)  SpO2: 100%  Weight: 228 lb 3.2 oz (103.5 kg)  Height: 5\' 4"  (1.626 m)

## 2021-05-20 ENCOUNTER — Telehealth: Payer: Self-pay | Admitting: *Deleted

## 2021-05-20 NOTE — Telephone Encounter (Signed)
Patient no showed PV today- Called patient at 130  PM HOME, 131 PM CELL , no answer and left message to return call - called pt at 135 pm home and cell 136 pm, no answer - LM to return call-  called 1342 home , called 1343 cell LM to return call -LM to return call by 5 pm today- If no call by 5 pm, PV and procedure will be canceled - no call at 5 pm -  PV and Procedure both canceled- No Show letter mailed to patient

## 2021-05-21 ENCOUNTER — Encounter: Payer: Self-pay | Admitting: Family Medicine

## 2021-06-04 ENCOUNTER — Encounter: Payer: Medicare Other | Admitting: Internal Medicine

## 2021-06-18 ENCOUNTER — Other Ambulatory Visit: Payer: Self-pay | Admitting: Family Medicine

## 2021-06-18 ENCOUNTER — Other Ambulatory Visit: Payer: Self-pay

## 2021-06-18 NOTE — Telephone Encounter (Signed)
Requested medication (s) are due for refill today: yes  Requested medication (s) are on the active medication list: yes  Last refill:  11/2020  Future visit scheduled: yes  Notes to clinic:  Please review if refill is appropriate. Last refill noted to be 11/2020.     Requested Prescriptions  Pending Prescriptions Disp Refills   pantoprazole (PROTONIX) 40 MG tablet 60 tablet 0    Sig: TAKE 1 TABLET (40 MG TOTAL) BY MOUTH DAILY.     Gastroenterology: Proton Pump Inhibitors Passed - 06/18/2021  8:44 AM      Passed - Valid encounter within last 12 months    Recent Outpatient Visits           4 months ago [facility] Charlott Rakes, MD   6 months ago Screening for colon cancer   [facility] Charlott Rakes, MD   1 year ago Viral upper respiratory tract infection   [place], [place], MD   1 year ago Screening for colon cancer   [place], [place], MD   2 years ago Essential hypertension   [facility] Charlott Rakes, MD

## 2021-06-19 MED ORDER — PANTOPRAZOLE SODIUM 40 MG PO TBEC
DELAYED_RELEASE_TABLET | Freq: Every day | ORAL | 0 refills | Status: DC
  Filled 2021-06-19 – 2021-06-23 (×3): qty 30, 30d supply, fill #0

## 2021-06-21 ENCOUNTER — Other Ambulatory Visit: Payer: Self-pay

## 2021-06-23 ENCOUNTER — Other Ambulatory Visit: Payer: Self-pay

## 2021-08-09 ENCOUNTER — Telehealth: Payer: Self-pay | Admitting: *Deleted

## 2021-08-09 NOTE — Telephone Encounter (Signed)
XXXX 

## 2021-08-09 NOTE — Telephone Encounter (Signed)
CALLED PATIENT TO INFORM OF FU APPT. WITH DR. LISA JACKSON-MOORE ON 10-13-21 - ARRIVAL TIME- 9:45 AM, NO ANSWER, NO VM ATTACHED, MAILED APPT. CARD

## 2021-08-09 NOTE — Telephone Encounter (Signed)
Enid Derry from radiation called and scheduled a follow up in April with Dr Delsa Sale. Enid Derry will contact the patient

## 2021-08-10 ENCOUNTER — Telehealth: Payer: Self-pay | Admitting: Family Medicine

## 2021-08-10 NOTE — Telephone Encounter (Signed)
Paperwork has been received and faxed back to company.

## 2021-08-10 NOTE — Telephone Encounter (Signed)
Copied from [place] [id number]. Topic: General - Other >> Aug 10, 2021 11:28 AM Tessa Lerner A wrote: Reason for CRM: Fara Olden with [employer] has called for an update on previously submitted paperwork   The paperwork relates to previously submitted requests to the practice   Please contact further when possible

## 2021-08-17 NOTE — Telephone Encounter (Signed)
Fara Olden with [employer] has called to share that the information has not been received yet  May it please be resubmitted to [phone]  Please contact further by phone if needed

## 2021-08-18 NOTE — Telephone Encounter (Signed)
Pt has been scheduled an appointment for office notes. ?

## 2021-08-23 ENCOUNTER — Ambulatory Visit: Payer: Medicare Other | Admitting: Family Medicine

## 2021-08-23 ENCOUNTER — Other Ambulatory Visit: Payer: Self-pay

## 2021-08-23 ENCOUNTER — Telehealth: Payer: Self-pay | Admitting: Family Medicine

## 2021-08-23 NOTE — Telephone Encounter (Signed)
Attempt to call patient to schedule apt.  ?No answer. Unable to leave voicemail.  ? ?

## 2021-08-23 NOTE — Telephone Encounter (Signed)
Pt called in needs to reschedule video appt she had for today, her phone was off. ?

## 2021-10-13 ENCOUNTER — Other Ambulatory Visit: Payer: Self-pay

## 2021-10-13 ENCOUNTER — Encounter (HOSPITAL_COMMUNITY): Payer: Self-pay

## 2021-10-13 ENCOUNTER — Inpatient Hospital Stay: Payer: Medicare Other | Attending: Obstetrics & Gynecology | Admitting: Obstetrics & Gynecology

## 2021-10-13 ENCOUNTER — Emergency Department (HOSPITAL_COMMUNITY)
Admission: EM | Admit: 2021-10-13 | Discharge: 2021-10-13 | Disposition: A | Discharge status: Home / Self Care | Payer: Medicare Other | Attending: Emergency Medicine | Admitting: Emergency Medicine

## 2021-10-13 VITALS — BP 220/120 | HR 73 | Temp 97.1°F | Resp 18 | Ht 64.0 in | Wt 228.0 lb

## 2021-10-13 DIAGNOSIS — Z79899 Other long term (current) drug therapy: Secondary | ICD-10-CM | POA: Diagnosis not present

## 2021-10-13 DIAGNOSIS — Z720 Tobacco use: Secondary | ICD-10-CM

## 2021-10-13 DIAGNOSIS — I10 Essential (primary) hypertension: Secondary | ICD-10-CM | POA: Diagnosis present

## 2021-10-13 DIAGNOSIS — C541 Malignant neoplasm of endometrium: Secondary | ICD-10-CM

## 2021-10-13 DIAGNOSIS — I5032 Chronic diastolic (congestive) heart failure: Secondary | ICD-10-CM

## 2021-10-13 DIAGNOSIS — I160 Hypertensive urgency: Secondary | ICD-10-CM

## 2021-10-13 LAB — CBC WITH DIFFERENTIAL/PLATELET
Abs Immature Granulocytes: 0.02 10*3/uL (ref 0.00–0.07)
Basophils Absolute: 0 10*3/uL (ref 0.0–0.1)
Basophils Relative: 1 %
Eosinophils Absolute: 0.1 10*3/uL (ref 0.0–0.5)
Eosinophils Relative: 3 %
HCT: 43.9 % (ref 36.0–46.0)
Hemoglobin: 14.1 g/dL (ref 12.0–15.0)
Immature Granulocytes: 0 %
Lymphocytes Relative: 37 %
Lymphs Abs: 1.7 10*3/uL (ref 0.7–4.0)
MCH: 29 pg (ref 26.0–34.0)
MCHC: 32.1 g/dL (ref 30.0–36.0)
MCV: 90.3 fL (ref 80.0–100.0)
Monocytes Absolute: 0.3 10*3/uL (ref 0.1–1.0)
Monocytes Relative: 7 %
Neutro Abs: 2.5 10*3/uL (ref 1.7–7.7)
Neutrophils Relative %: 52 %
Platelets: 243 10*3/uL (ref 150–400)
RBC: 4.86 MIL/uL (ref 3.87–5.11)
RDW: 16 % — ABNORMAL HIGH (ref 11.5–15.5)
WBC: 4.7 10*3/uL (ref 4.0–10.5)
nRBC: 0 % (ref 0.0–0.2)

## 2021-10-13 LAB — COMPREHENSIVE METABOLIC PANEL
ALT: 12 U/L (ref 0–44)
AST: 14 U/L — ABNORMAL LOW (ref 15–41)
Albumin: 3.6 g/dL (ref 3.5–5.0)
Alkaline Phosphatase: 109 U/L (ref 38–126)
Anion gap: 7 (ref 5–15)
BUN: 28 mg/dL — ABNORMAL HIGH (ref 6–20)
CO2: 23 mmol/L (ref 22–32)
Calcium: 9 mg/dL (ref 8.9–10.3)
Chloride: 112 mmol/L — ABNORMAL HIGH (ref 98–111)
Creatinine, Ser: 1.42 mg/dL — ABNORMAL HIGH (ref 0.44–1.00)
GFR, Estimated: 43 mL/min — ABNORMAL LOW (ref >60)
Glucose, Bld: 104 mg/dL — ABNORMAL HIGH (ref 70–99)
Potassium: 3.6 mmol/L (ref 3.5–5.1)
Sodium: 142 mmol/L (ref 135–145)
Total Bilirubin: 0.6 mg/dL (ref 0.3–1.2)
Total Protein: 7 g/dL (ref 6.5–8.1)

## 2021-10-13 MED ORDER — CLONIDINE HCL 0.1 MG PO TABS
0.2000 mg | ORAL_TABLET | Freq: Once | ORAL | Status: AC
  Administered 2021-10-13: 0.2 mg via ORAL
  Filled 2021-10-13: qty 2

## 2021-10-13 MED ORDER — BUPROPION HCL ER (XL) 150 MG PO TB24
150.0000 mg | ORAL_TABLET | Freq: Every day | ORAL | 0 refills | Status: DC
  Filled 2021-10-13: qty 30, 30d supply, fill #0

## 2021-10-13 MED ORDER — FUROSEMIDE 40 MG PO TABS
40.0000 mg | ORAL_TABLET | Freq: Two times a day (BID) | ORAL | 0 refills | Status: DC
  Filled 2021-10-13: qty 60, 30d supply, fill #0

## 2021-10-13 MED ORDER — ATORVASTATIN CALCIUM 40 MG PO TABS
20.0000 mg | ORAL_TABLET | Freq: Every day | ORAL | 0 refills | Status: DC
  Filled 2021-10-13: qty 15, 30d supply, fill #0

## 2021-10-13 MED ORDER — PANTOPRAZOLE SODIUM 40 MG PO TBEC
40.0000 mg | DELAYED_RELEASE_TABLET | Freq: Every day | ORAL | 0 refills | Status: DC
  Filled 2021-10-13: qty 30, 30d supply, fill #0

## 2021-10-13 MED ORDER — LACTATED RINGERS IV BOLUS
1000.0000 mL | Freq: Once | INTRAVENOUS | Status: AC
  Administered 2021-10-13: 1000 mL via INTRAVENOUS

## 2021-10-13 MED ORDER — HYDRALAZINE HCL 50 MG PO TABS
75.0000 mg | ORAL_TABLET | Freq: Three times a day (TID) | ORAL | 0 refills | Status: DC
  Filled 2021-10-13: qty 135, 30d supply, fill #0

## 2021-10-13 MED ORDER — AMLODIPINE BESYLATE 5 MG PO TABS
10.0000 mg | ORAL_TABLET | Freq: Once | ORAL | Status: AC
  Administered 2021-10-13: 10 mg via ORAL
  Filled 2021-10-13: qty 2

## 2021-10-13 MED ORDER — AMLODIPINE BESYLATE 5 MG PO TABS
5.0000 mg | ORAL_TABLET | Freq: Every day | ORAL | 0 refills | Status: DC
  Filled 2021-10-13: qty 30, 30d supply, fill #0

## 2021-10-13 MED ORDER — HYDRALAZINE HCL 20 MG/ML IJ SOLN
10.0000 mg | Freq: Once | INTRAMUSCULAR | Status: AC
  Administered 2021-10-13: 10 mg via INTRAVENOUS
  Filled 2021-10-13: qty 1

## 2021-10-13 MED ORDER — HYDRALAZINE HCL 25 MG PO TABS
75.0000 mg | ORAL_TABLET | Freq: Once | ORAL | Status: AC
  Administered 2021-10-13: 75 mg via ORAL
  Filled 2021-10-13: qty 3

## 2021-10-13 NOTE — Progress Notes (Signed)
BP (!) 220/120 (BP Location: Left Arm, Patient Position: Sitting) Comment: Manual BP. Patient reports being out of her BP meds. Denies HA, nausea, dizziness, blurred vision. MD notified.  Pulse 73   Temp (!) 97.1 ?F (36.2 ?C) (Tympanic)   Resp 18   Ht '5\' 4"'$  (1.626 m)   Wt 228 lb (103.4 kg)   LMP  (LMP Unknown) Comment: vaginal bleeding continuously for last 8 months  SpO2 100%   BMI 39.14 kg/m?  ?Pt counseled to present to the ED w/severe B/P elevations.  Reschedule appointment. ?

## 2021-10-13 NOTE — ED Triage Notes (Signed)
Pt was at the cancer center for a routine follow up visit. Nurse reports patient having high blood pressure during the follow up visit. Patient states she stopped taking her BP medications a while ago due to them making her dizzy and light headed. Patient states she hates the way the BP makes her feel. ?

## 2021-10-13 NOTE — ED Provider Notes (Signed)
?[facility] ?Provider Note ? ? ?CSN: [id number] ?Arrival date & time: 10/13/21  1003 ? ?  ? ?History ? ?No chief complaint on file. ? ? ?Angela Gardner is a 61 y.o. female. ? ?HPI ?61 year old female presents with hypertension.  She went to the cancer center for a routine follow-up and the blood pressure was 220/120 so she was sent here.  She denies any acute symptoms such as headache, vision changes, urinary difficulties or chest pain.  No weakness or numbness.  She states that she stopped all of her medicines about a week ago, partially because of running out of them but partially because she did not like the way they made her feel. ? ?Home Medications ?Prior to Admission medications   ?Medication Sig Start Date End Date Taking? Authorizing Provider  ?acetaminophen (TYLENOL) 160 MG/5ML liquid Take 325 mg by mouth every 4 (four) hours as needed for fever.    [provider]  ?amLODipine (NORVASC) 5 MG tablet Take 1 tablet (5 mg total) by mouth daily. 10/13/21 11/12/21  Sherwood Gambler, MD  ?atorvastatin (LIPITOR) 40 MG tablet Take 0.5 tablets (20 mg total) by mouth daily. 10/13/21 11/12/21  Sherwood Gambler, MD  ?buPROPion (WELLBUTRIN XL) 150 MG 24 hr tablet Take 1 tablet (150 mg total) by mouth daily. For smoking cessation 10/13/21 11/12/21  Sherwood Gambler, MD  ?cloNIDine (CATAPRES) 0.2 MG tablet TAKE 1 TABLET (0.2 MG TOTAL) BY MOUTH AT BEDTIME. 06/16/20 06/16/21  Park Liter, MD  ?diphenhydrAMINE (BENADRYL) 25 mg capsule Take 25 mg by mouth every 6 (six) hours as needed for itching.    [provider]  ?furosemide (LASIX) 40 MG tablet Take 1 tablet (40 mg total) by mouth 2 (two) times daily. 10/13/21   Sherwood Gambler, MD  ?hydrALAZINE (APRESOLINE) 50 MG tablet Take 1.5 tablets (75 mg total) by mouth 3 (three) times daily. 10/13/21 11/12/21  Sherwood Gambler, MD  ?lidocaine (XYLOCAINE) 2 % solution Use as directed 15 mLs in the mouth or throat as needed for mouth  pain. 01/28/21   Charlott Rakes, MD  ?pantoprazole (PROTONIX) 40 MG tablet Take 1 tablet (40 mg total) by mouth daily. 10/13/21   Sherwood Gambler, MD  ?prochlorperazine (COMPAZINE) 10 MG tablet Take 1 tablet (10 mg total) by mouth every 6 (six) hours as needed (Nausea or vomiting). ?Patient not taking: Reported on 05/25/2018 12/08/17 05/29/18  Heath Lark, MD  ?   ? ?Allergies    ?Vicodin [hydrocodone-acetaminophen]   ? ?Review of Systems   ?Review of Systems  ?Eyes:  Negative for visual disturbance.  ?Respiratory:  Negative for shortness of breath.   ?Cardiovascular:  Negative for chest pain.  ?Neurological:  Negative for weakness, numbness and headaches.  ? ?Physical Exam ?Updated Vital Signs ?BP (!) 172/85   Pulse (!) 57   Temp 97.7 ?F (36.5 ?C) (Oral)   Resp 17   Ht '5\' 4"'$  (1.626 m)   Wt 102.5 kg   LMP  (LMP Unknown) Comment: vaginal bleeding continuously for last 8 months  SpO2 100%   BMI 38.79 kg/m?  ?Physical Exam ?Vitals and nursing note reviewed.  ?Constitutional:   ?   Appearance: She is well-developed. She is obese. She is not ill-appearing or diaphoretic.  ?HENT:  ?   Head: Normocephalic and atraumatic.  ?Eyes:  ?   Extraocular Movements: Extraocular movements intact.  ?   Pupils: Pupils are equal, round, and reactive to light.  ?Cardiovascular:  ?   Rate and Rhythm:  Normal rate and regular rhythm.  ?   Heart sounds: Normal heart sounds.  ?Pulmonary:  ?   Effort: Pulmonary effort is normal.  ?   Breath sounds: Normal breath sounds.  ?Abdominal:  ?   Palpations: Abdomen is soft.  ?   Tenderness: There is no abdominal tenderness.  ?Skin: ?   General: Skin is warm and dry.  ?Neurological:  ?   Mental Status: She is alert.  ?   Comments: CN 3-12 grossly intact. 5/5 strength in all 4 extremities. Grossly normal sensation. Normal finger to nose.   ? ? ?ED Results / Procedures / Treatments   ?Labs ?(all labs ordered are listed, but only abnormal results are displayed) ?Labs Reviewed  ?COMPREHENSIVE  METABOLIC PANEL - Abnormal; Notable for the following components:  ?    Result Value  ? Chloride 112 (*)   ? Glucose, Bld 104 (*)   ? BUN 28 (*)   ? Creatinine, Ser 1.42 (*)   ? AST 14 (*)   ? GFR, Estimated 43 (*)   ? All other components within normal limits  ?CBC WITH DIFFERENTIAL/PLATELET - Abnormal; Notable for the following components:  ? RDW 16.0 (*)   ? All other components within normal limits  ? ? ?EKG ?EKG Interpretation ? ?Date/Time:  Wednesday October 13 2021 10:08:14 EDT ?Ventricular Rate:  69 ?PR Interval:  145 ?QRS Duration: 95 ?QT Interval:  473 ?QTC Calculation: 507 ?R Axis:   -12 ?Text Interpretation: Sinus rhythm Consider right atrial enlargement Left ventricular hypertrophy Nonspecific T abnormalities, lateral leads Borderline prolonged QT interval no significant change since July 2022 Confirmed by Sherwood Gambler [phone]) on 10/13/2021 10:55:37 AM ? ?Radiology ?No results found. ? ?Procedures ?Procedures  ? ? ?Medications Ordered in ED ?Medications  ?amLODipine (NORVASC) tablet 10 mg (10 mg Oral Given 10/13/21 1028)  ?hydrALAZINE (APRESOLINE) tablet 75 mg (75 mg Oral Given 10/13/21 1028)  ?cloNIDine (CATAPRES) tablet 0.2 mg (0.2 mg Oral Given 10/13/21 1212)  ?hydrALAZINE (APRESOLINE) injection 10 mg (10 mg Intravenous Given 10/13/21 1306)  ?lactated ringers bolus 1,000 mL (0 mLs Intravenous Stopped 10/13/21 1455)  ? ? ?ED Course/ Medical Decision Making/ A&P ?Clinical Course as of 10/13/21 1456  ?Wed Oct 13, 2021  ?1349 Will watch and reassess [SG]  ?  ?Clinical Course User Index ?[SG] Sherwood Gambler, MD  ? ?                        ?Medical Decision Making ?Amount and/or Complexity of Data Reviewed ?Labs: ordered. ? ?Risk ?Prescription drug management. ? ? ?Patient presents with asymptomatic hypertension.  I have reviewed the notes, including brief notes from where she was sent down from gyn-onc clinic.  No strokelike symptoms or signs of end-organ damage.  Thus we decided that she needs to go back on  her blood pressure meds and I have given her home meds here.  However despite waiting it seems like this was ineffective.  Thus she was given a dose of IV hydralazine given continued blood pressures in the 476 systolic range. ? ?After this, she had a dramatic lowering in her blood pressure down to both 170 and then even down to 120.  Seems like I accidentally overshot BP goal.  Fortunately, she suffered no side effects from this.  She did complain of her right arm hurting though there was no numbness or weakness and it seems like this was more of an IV problem.  I rechecked  her neurologic exam and she continues to have no focal weakness or numbness or any other symptoms.  Her blood pressure did start to come up.  I had originally talked with Dr. Marylyn Ishihara about potentially admitting her but it seems like her blood pressure is more stable now and she has had multiple blood pressures in the 160-170 range and seems to be leveled out.  She would like to go home which I think is reasonable.  I will rewrite her home meds though may be started at a lower dose of amlodipine.  At this point, she appears stable for discharge home. ? ?I have reviewed the ECG and labs and there is no significant change from baseline.  No acute ischemia.  ? ? ? ? ? ? ? ?Final Clinical Impression(s) / ED Diagnoses ?Final diagnoses:  ?Hypertensive urgency  ? ? ?Rx / DC Orders ?ED Discharge Orders   ? ?      Ordered  ?  amLODipine (NORVASC) 5 MG tablet  Daily       ? 10/13/21 1452  ?  pantoprazole (PROTONIX) 40 MG tablet  Daily       ?Note to Pharmacy: Must have office visit for refills  ? 10/13/21 1452  ?  furosemide (LASIX) 40 MG tablet  2 times daily       ? 10/13/21 1452  ?  hydrALAZINE (APRESOLINE) 50 MG tablet  3 times daily       ? 10/13/21 1452  ?  buPROPion (WELLBUTRIN XL) 150 MG 24 hr tablet  Daily       ? 10/13/21 1452  ?  atorvastatin (LIPITOR) 40 MG tablet  Daily       ? 10/13/21 1452  ? ?  ?  ? ?  ? ? ?  ?Sherwood Gambler, MD ?10/13/21  1500 ? ?

## 2021-10-13 NOTE — Discharge Instructions (Addendum)
Your blood pressure was very high today.  You need to start taking your meds again.  At this point we are going to restart you on slightly lower doses given how much her blood pressure dropped with your home doses today.  Call your doctor and arrange close follow-up so they can treat your blood pressure as an outpatient. ? ?If you develop headache, vision changes, weakness or numbness, weakness or numbness in the extremities, speech changes, chest pain, or any other new/concerning symptoms then return to the ER for evaluation. ?

## 2021-10-13 NOTE — Progress Notes (Signed)
Patient arrived to her appointment with Dr. Delsa Sale with BP 228/117. Manual BP 220/120. MD notified. Per MD patient needs to be evaluated in the ED. ED Charge nurse notified. Patient transported to ED room 10. Report given to [place], Therapist, sports.  ?

## 2021-10-15 ENCOUNTER — Other Ambulatory Visit: Payer: Self-pay

## 2021-10-27 ENCOUNTER — Inpatient Hospital Stay: Payer: Medicare Other | Admitting: Obstetrics & Gynecology

## 2021-10-27 ENCOUNTER — Telehealth: Payer: Self-pay | Admitting: *Deleted

## 2021-10-27 DIAGNOSIS — C541 Malignant neoplasm of endometrium: Secondary | ICD-10-CM

## 2021-10-27 NOTE — Telephone Encounter (Signed)
Attempted to reach pt this morning regarding her canceling her appt due to a fall. Unable to reach pt on all 3 of her phone numbers and unable to leave a message for a return call.  ?

## 2021-11-05 ENCOUNTER — Other Ambulatory Visit: Payer: Self-pay | Admitting: Emergency Medicine

## 2021-11-05 DIAGNOSIS — I5032 Chronic diastolic (congestive) heart failure: Secondary | ICD-10-CM

## 2021-11-08 ENCOUNTER — Other Ambulatory Visit: Payer: Self-pay | Admitting: Family Medicine

## 2021-11-08 ENCOUNTER — Other Ambulatory Visit: Payer: Self-pay

## 2021-11-08 DIAGNOSIS — I5032 Chronic diastolic (congestive) heart failure: Secondary | ICD-10-CM

## 2021-11-09 ENCOUNTER — Other Ambulatory Visit: Payer: Self-pay

## 2021-11-09 MED ORDER — ATORVASTATIN CALCIUM 40 MG PO TABS
20.0000 mg | ORAL_TABLET | Freq: Every day | ORAL | 0 refills | Status: DC
  Filled 2021-11-09 – 2022-02-11 (×2): qty 15, 30d supply, fill #0

## 2021-11-09 NOTE — Telephone Encounter (Signed)
Pt hasn't had any lipid panel drawn. Don't know if refill is appropriate

## 2021-11-10 ENCOUNTER — Telehealth: Payer: Medicare Other | Admitting: Family Medicine

## 2021-11-10 ENCOUNTER — Other Ambulatory Visit: Payer: Self-pay

## 2021-11-10 ENCOUNTER — Encounter: Payer: Self-pay | Admitting: Obstetrics & Gynecology

## 2021-11-10 ENCOUNTER — Inpatient Hospital Stay: Payer: Medicare Other | Attending: Obstetrics & Gynecology | Admitting: Obstetrics & Gynecology

## 2021-11-10 VITALS — BP 158/93 | HR 73 | Temp 98.2°F | Resp 16 | Ht 64.0 in | Wt 222.9 lb

## 2021-11-10 DIAGNOSIS — C541 Malignant neoplasm of endometrium: Secondary | ICD-10-CM

## 2021-11-10 DIAGNOSIS — Z8542 Personal history of malignant neoplasm of other parts of uterus: Secondary | ICD-10-CM | POA: Insufficient documentation

## 2021-11-10 DIAGNOSIS — Z923 Personal history of irradiation: Secondary | ICD-10-CM | POA: Insufficient documentation

## 2021-11-10 DIAGNOSIS — Z9221 Personal history of antineoplastic chemotherapy: Secondary | ICD-10-CM | POA: Diagnosis not present

## 2021-11-10 DIAGNOSIS — Z90710 Acquired absence of both cervix and uterus: Secondary | ICD-10-CM | POA: Diagnosis not present

## 2021-11-10 DIAGNOSIS — Z90722 Acquired absence of ovaries, bilateral: Secondary | ICD-10-CM | POA: Insufficient documentation

## 2021-11-10 NOTE — Assessment & Plan Note (Addendum)
Angela Gardner  is a 62 y.o.  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 High risk factors for recurrence given the unfavorable cell type.  S/p adjuvant therapy with 6cycles of carb/taxol and vaginal brachytherapy completed November, 2019. Negative symptom review, normal exam.  No evidence of recurrence   > Recommend follow-up with Dr Sondra Come in 6 months and return in 1 yr.

## 2021-11-10 NOTE — Progress Notes (Signed)
Follow Up Note: Gyn-Onc  Angela Gardner 61 y.o. female  CC: She returns for a f/u visit   HPI: The oncology history was reviewed.  Interval History: She denies any vaginal bleeding, abdominal/pelvic pain, cough, lethargy or abdominal distention. She was seen by RAD-ONC in 10/22 and was felt to have no clinical evidence of recurrence on exam.    Review of Systems  Review of Systems  Constitutional:  Negative for malaise/fatigue and weight loss.  Respiratory:  Negative for shortness of breath and wheezing.   Cardiovascular:  Negative for chest pain and leg swelling.  Gastrointestinal:  Negative for abdominal pain, blood in stool, constipation, nausea and vomiting.  Genitourinary:  Negative for dysuria, frequency, hematuria and urgency.  Musculoskeletal:  Negative for joint pain and myalgias.  Neurological:  Negative for weakness.  Psychiatric/Behavioral:  Negative for depression. The patient does not have insomnia.    Current medications, allergy, social history, past surgical history, past medical history, family history were all reviewed.    Vitals:  BP (!) 158/93 (BP Location: Left Arm, Patient Position: Sitting)   Pulse 73   Temp 98.2 F (36.8 C) (Oral)   Resp 16   Ht '5\' 4"'$  (1.626 m)   Wt 222 lb 14.4 oz (101.1 kg)   LMP  (LMP Unknown) Comment: vaginal bleeding continuously for last 8 months  SpO2 99%   BMI 38.26 kg/m    Physical Exam:  Physical Exam Exam conducted with a chaperone present.  Constitutional:      General: She is not in acute distress. Cardiovascular:     Rate and Rhythm: Normal rate and regular rhythm.  Pulmonary:     Effort: Pulmonary effort is normal.     Breath sounds: Normal breath sounds. No wheezing or rhonchi.  Abdominal:     Palpations: Abdomen is soft.     Tenderness: There is no abdominal tenderness. There is no right CVA tenderness or left CVA tenderness.     Hernia: No hernia is present.  Genitourinary:    General: Normal vulva.      Urethra: No urethral lesion.     Vagina: No lesions. No bleeding Musculoskeletal:     Cervical back: Neck supple.     Right lower leg: No edema.     Left lower leg: No edema.  Lymphadenopathy:     Upper Body:     Right upper body: No supraclavicular adenopathy.     Left upper body: No supraclavicular adenopathy.     Lower Body: No right inguinal adenopathy. No left inguinal adenopathy.  Skin:    Findings: No rash.  Neurological:     Mental Status: She is oriented to person, place, and time.   Assessment/Plan:  Endometrial cancer (Angela Gardner) [name]  is a 61 y.o.  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 High risk factors for recurrence given the unfavorable cell type.   S/p adjuvant therapy with 6cycles of carb/taxol and vaginal brachytherapy completed November, 2019. Negative symptom review, normal exam.  No evidence of recurrence    > Recommend follow-up with Dr Sondra Come in 6 months and return in 1 yr.     Lahoma Crocker, MD

## 2021-11-10 NOTE — Patient Instructions (Signed)
Return in 1 year ?

## 2021-11-16 ENCOUNTER — Telehealth: Payer: Medicare Other | Admitting: Family Medicine

## 2021-11-16 ENCOUNTER — Other Ambulatory Visit: Payer: Self-pay

## 2021-11-19 ENCOUNTER — Encounter: Payer: Self-pay | Admitting: Family Medicine

## 2021-11-19 ENCOUNTER — Telehealth (HOSPITAL_BASED_OUTPATIENT_CLINIC_OR_DEPARTMENT_OTHER): Payer: Medicare Other | Admitting: Family Medicine

## 2021-11-19 DIAGNOSIS — N39498 Other specified urinary incontinence: Secondary | ICD-10-CM

## 2021-11-19 DIAGNOSIS — I10 Essential (primary) hypertension: Secondary | ICD-10-CM

## 2021-11-19 DIAGNOSIS — R32 Unspecified urinary incontinence: Secondary | ICD-10-CM | POA: Insufficient documentation

## 2021-11-19 DIAGNOSIS — Z8542 Personal history of malignant neoplasm of other parts of uterus: Secondary | ICD-10-CM

## 2021-11-19 NOTE — Progress Notes (Signed)
Virtual Visit via Telephone Note  I connected with Angela Gardner, on 11/19/2021 at 8:12 AM by telephone and verified that I am speaking with the correct person using two identifiers.   Consent: I discussed the limitations, risks, security and privacy concerns of performing an evaluation and management service by telephone and the availability of in person appointments. I also discussed with the patient that there may be a patient responsible charge related to this service. The patient expressed understanding and agreed to proceed.   Location of Patient: Home  Location of Provider: Clinic   Persons participating in Telemedicine visit: Angela Gardner Dr. Margarita Rana     History of Present Illness: Angela Gardner is a 61 y.o. year old female with a history of hypertension, stage III chronic kidney disease, diastolic CHF (EF 60 to 47% from 07/2018), Endometrial carcinoma diagnosed in 09/2017 s/p total abdominal hysterectomy and bilateral salpingo-oophorectomy on 11/14/2017, status post radiation and chemotherapy.  She is seen today for incontinence supplies.  Last visit to the clinic was a telehealth visit on 01/2021 She has not had an in person visit to the Clinic since 06/2019 but has had 3 telehealth visits since then. States she does not want to get sick that is why she has not come into the clinic. 5 weeks ago she had an ED visit for hypertensive urgency.  She endorses adherence with her medications but does not have a means of checking her blood pressure at home.  She has also not been to see cardiology in a while.  She needs incontinence supplies as she suffers from urinary incontinence especially when she takes her Lasix.  Appointment with Radiation Oncology was 1 week ago and notes reviewed.  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   Cancer ([facility])    left ductal papilloma   Chest discomfort 09/12/2017   Negative stress test in May 2019   Chest pain 12/20/2019   CHF (congestive heart failure) ([facility])    Chronic kidney disease (CKD), stage III (moderate) 10/30/2017   Deficiency anemia 12/06/2017   Depression 42/59/5638   Diastolic congestive heart failure, NYHA class 3 ([place]) 10/27/2017   Diffuse pain 01/04/2018   Dysfunctional uterine bleeding 09/12/2017   Dyspnea    still having this and not moving around alot -gets worse with exertion   Endometrial cancer ([facility]) 10/19/2017   Essential hypertension 10/27/2017   History of radiation therapy    Endometrium- 01/16/18-02/21/18- Dr. 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 30 tablet 0   atorvastatin (LIPITOR) 40 MG tablet Take 0.5 tablets (20 mg total) by mouth daily. 15 tablet 0   buPROPion (WELLBUTRIN XL) 150 MG 24 hr tablet Take 1 tablet (150 mg total) by mouth daily. For smoking cessation 30 tablet 0   diphenhydrAMINE (BENADRYL) 25 mg capsule Take 25 mg by mouth every 6 (six) hours as needed for itching.     furosemide (LASIX) 40 MG tablet Take 1 tablet (40 mg total) by mouth 2 (two) times daily. 60 tablet 0   hydrALAZINE (APRESOLINE) 50 MG tablet Take 1.5 tablets (75 mg total) by mouth 3 (three) times daily. 135 tablet 0   pantoprazole (PROTONIX) 40 MG tablet Take 1 tablet (40 mg total) by mouth daily. 30 tablet 0   [DISCONTINUED] prochlorperazine (COMPAZINE) 10 MG tablet Take 1 tablet (10 mg total) by mouth every 6 (six) hours as needed (Nausea or vomiting). (Patient not taking: Reported on 05/25/2018) 30 tablet 1   No current facility-administered medications on file prior to visit.    ROS: See HPI  Observations/Objective: Awake, alert, oriented x3 Not in acute distress Normal mood      Latest Ref Rng & Units 10/13/2021   10:21 AM 01/11/2021    3:25 AM 01/10/2021    3:53 AM  CMP  Glucose 70 - 99 mg/dL 104   156   147    BUN 6 - 20 mg/dL 28   44   43    Creatinine 0.44 - 1.00 mg/dL 1.42   1.80   1.96    Sodium 135 - 145 mmol/L 142   136   137    Potassium 3.5 - 5.1 mmol/L 3.6   3.5   4.0    Chloride 98 - 111 mmol/L 112   103   105    CO2 22 - 32 mmol/L '23   24   23    '$ Calcium 8.9 - 10.3 mg/dL 9.0   8.5   9.0    Total Protein 6.5 - 8.1 g/dL 7.0   6.5   6.9    Total Bilirubin 0.3 - 1.2 mg/dL 0.6   0.4   0.3    Alkaline Phos 38 - 126 U/L 109   85   82    AST 15 - 41 U/L '14   15   16    '$ ALT 0 - 44 U/L '12   16   15      '$ Lipid Panel     Component Value Date/Time   CHOL 248 (H) 07/02/2019 1135   TRIG 88 01/07/2021 2055   HDL 78 07/02/2019 1135   CHOLHDL 3.2 07/02/2019 1135   CHOLHDL 3.4 Ratio  03/30/2009 2039   VLDL 28 03/30/2009 2039   LDLCALC 155 (H) 07/02/2019 1135   LABVLDL 15 07/02/2019 1135    Lab Results  Component Value Date   HGBA1C 6.0 07/03/2018    Assessment and Plan: 1. Other urinary incontinence We will proceed with writing incontinence supplies for her  2. Essential hypertension Unable to assess blood pressure control given last in person visit was in 2021 Last visit 5 weeks ago to the ED for hypertensive urgency Emphasized the need to come into the clinic for an in person office visit for better BP assessment She endorses having a good supply of all her refills Counseled on blood pressure goal of less than 130/80, low-sodium, DASH diet, medication compliance, 150 minutes of moderate intensity exercise per week. Discussed medication compliance, adverse effects.  3. History of endometrial cancer Status post radiation and chemo No evidence of recurrence but she does have a high risk of recurrence per radiation notes Follow-up with radiation oncology in 6 months   Follow Up Instructions: Patient to call the clinic today to set up a follow-up appointment which will be an in person visit   I discussed the assessment and treatment plan with the patient. The patient was provided an opportunity to ask questions and all were answered. The patient agreed with the plan and demonstrated an understanding of the instructions.   The patient was advised to call back or seek an in-person evaluation if the symptoms worsen or if the condition fails to improve as anticipated.     I provided 11 minutes total of non-face-to-face time during this encounter.   Charlott Rakes, MD, FAAFP. [facility] and [facility] [place], [place]   11/19/2021, 8:12 AM

## 2021-12-06 ENCOUNTER — Other Ambulatory Visit: Payer: Self-pay | Admitting: Emergency Medicine

## 2021-12-06 ENCOUNTER — Other Ambulatory Visit: Payer: Self-pay

## 2021-12-06 DIAGNOSIS — Z720 Tobacco use: Secondary | ICD-10-CM

## 2021-12-16 ENCOUNTER — Other Ambulatory Visit: Payer: Self-pay

## 2022-01-11 MED ORDER — BUPROPION HCL ER (XL) 150 MG PO TB24
150.0000 mg | ORAL_TABLET | Freq: Every day | ORAL | 1 refills | Status: DC
  Filled 2022-01-11: qty 30, 30d supply, fill #0
  Filled 2022-02-11: qty 30, 30d supply, fill #1

## 2022-01-11 MED ORDER — PANTOPRAZOLE SODIUM 40 MG PO TBEC
40.0000 mg | DELAYED_RELEASE_TABLET | Freq: Every day | ORAL | 1 refills | Status: DC
  Filled 2022-01-11: qty 30, 30d supply, fill #0
  Filled 2022-02-11: qty 30, 30d supply, fill #1

## 2022-01-11 NOTE — Telephone Encounter (Signed)
Requested medication (s) are due for refill today: Yes  Requested medication (s) are on the active medication list: Yes  Last refill:  10/13/21  Future visit scheduled: Yes  Notes to clinic:  Unable to refill per protocol, last refill by another provider (ED physician) at discharge from the hospital, patient would like refill until upcoming appointment on 03/07/22.     Requested Prescriptions  Pending Prescriptions Disp Refills   pantoprazole (PROTONIX) 40 MG tablet 30 tablet 0    Sig: Take 1 tablet (40 mg total) by mouth daily.     Gastroenterology: Proton Pump Inhibitors Passed - 01/11/2022 10:05 AM      Passed - Valid encounter within last 12 months    Recent Outpatient Visits           1 month ago Essential hypertension   [place] Charlott Rakes, MD   11 months ago [place], Enobong, MD   1 year ago Screening for colon cancer   [place], Enobong, MD   2 years ago Viral upper respiratory tract infection   [place], Enobong, MD   2 years ago Screening for colon cancer   [place], Charlane Ferretti, MD       Future Appointments             In 1 month Charlott Rakes, MD [place]             buPROPion (WELLBUTRIN XL) 150 MG 24 hr tablet 30 tablet 0    Sig: Take 1 tablet (150 mg total) by mouth daily. For smoking cessation     Psychiatry: Antidepressants - bupropion Failed - 01/11/2022 10:05 AM      Failed - Cr in normal range and within 360 days    Creatinine  Date Value Ref Range Status  05/28/2018 1.14 (H) 0.44 - 1.00 mg/dL Final   Creatinine, Ser  Date Value Ref Range Status  10/13/2021 1.42 (H) 0.44 - 1.00 mg/dL Final   Creatinine,U  Date Value Ref Range Status  10/23/2009 395.6 mg/dL Final    Comment:    See lab report  for associated comment(s)         Failed - AST in normal range and within 360 days    AST  Date Value Ref Range Status  10/13/2021 14 (L) 15 - 41 U/L Final  05/28/2018 14 (L) 15 - 41 U/L Final         Failed - Completed PHQ-2 or PHQ-9 in the last 360 days      Failed - Last BP in normal range    BP Readings from Last 1 Encounters:  11/10/21 (!) 158/93         Failed - Valid encounter within last 6 months    Recent Outpatient Visits           1 month ago Essential hypertension   [place], Charlane Ferretti, MD   11 months ago [place], Charlane Ferretti, MD   1 year ago Screening for colon cancer   [place], Charlane Ferretti, MD   2 years ago Viral upper respiratory tract infection   [place], Charlane Ferretti, MD   2 years ago Screening for colon  cancer   [place], Charlane Ferretti, MD       Future Appointments             In 1 month Charlott Rakes, MD [place] - ALT in normal range and within 360 days    ALT  Date Value Ref Range Status  10/13/2021 12 0 - 44 U/L Final  05/28/2018 12 0 - 44 U/L Final         Refused Prescriptions Disp Refills   pantoprazole (PROTONIX) 40 MG tablet 30 tablet 0    Sig: Take 1 tablet (40 mg total) by mouth daily.     Gastroenterology: Proton Pump Inhibitors Passed - 01/11/2022 10:05 AM      Passed - Valid encounter within last 12 months    Recent Outpatient Visits           1 month ago Essential hypertension   [place] Charlott Rakes, MD   11 months ago [place], Enobong, MD   1 year ago Screening for colon cancer   [place], Enobong, MD   2 years ago Viral upper  respiratory tract infection   [place], Enobong, MD   2 years ago Screening for colon cancer   [place], Charlane Ferretti, MD       Future Appointments             In 1 month Charlott Rakes, MD [place]             buPROPion (WELLBUTRIN XL) 150 MG 24 hr tablet 30 tablet 0    Sig: Take 1 tablet (150 mg total) by mouth daily. For smoking cessation     Psychiatry: Antidepressants - bupropion Failed - 01/11/2022 10:05 AM      Failed - Cr in normal range and within 360 days    Creatinine  Date Value Ref Range Status  05/28/2018 1.14 (H) 0.44 - 1.00 mg/dL Final   Creatinine, Ser  Date Value Ref Range Status  10/13/2021 1.42 (H) 0.44 - 1.00 mg/dL Final   Creatinine,U  Date Value Ref Range Status  10/23/2009 395.6 mg/dL Final    Comment:    See lab report for associated comment(s)         Failed - AST in normal range and within 360 days    AST  Date Value Ref Range Status  10/13/2021 14 (L) 15 - 41 U/L Final  05/28/2018 14 (L) 15 - 41 U/L Final         Failed - Completed PHQ-2 or PHQ-9 in the last 360 days      Failed - Last BP in normal range    BP Readings from Last 1 Encounters:  11/10/21 (!) 158/93         Failed - Valid encounter within last 6 months    Recent Outpatient Visits           1 month ago Essential hypertension   [place], Charlane Ferretti, MD   11 months ago [place], Charlane Ferretti, MD   1 year ago Screening for colon cancer   [place],  Charlane Ferretti, MD   2 years ago Viral upper respiratory tract infection   [place], Enobong, MD   2 years ago Screening for colon cancer   [place], Enobong, MD       Future Appointments             In 1  month Charlott Rakes, MD [place] - ALT in normal range and within 360 days    ALT  Date Value Ref Range Status  10/13/2021 12 0 - 44 U/L Final  05/28/2018 12 0 - 44 U/L Final

## 2022-01-11 NOTE — Addendum Note (Signed)
Addended by: Daisy Blossom, Annie Main L on: 01/11/2022 09:56 PM   Modules accepted: Orders

## 2022-01-11 NOTE — Addendum Note (Signed)
Addended by: Matilde Sprang on: 01/11/2022 10:05 AM   Modules accepted: Orders

## 2022-01-11 NOTE — Telephone Encounter (Signed)
Medication Refill - Medication: pantoprazole (PROTONIX) 40 MG tablet and buPROPion (WELLBUTRIN XL) 150 MG 24 hr tablet  Patient has an appt on 09/18 and doesn't have contact with prescribing Dr Sherwood Gambler. Patient is out of these meds. Patient says has these delivered  Has the patient contacted their pharmacy? Yes  (Agent: If no, request that the patient contact the pharmacy for the refill. If patient does not wish to contact the pharmacy document the reason why and proceed with request.) (Agent: If yes, when and what did the pharmacy advise?)contact pcp  Preferred Pharmacy (with phone number or street name):  [facility] Phone:  [phone]  Fax:  [phone]     Has the patient been seen for an appointment in the last year OR does the patient have an upcoming appointment? yes Agent: Please be advised that RX refills may take up to 3 business days. We ask that you follow-up with your pharmacy.

## 2022-01-12 ENCOUNTER — Other Ambulatory Visit: Payer: Self-pay

## 2022-01-17 ENCOUNTER — Other Ambulatory Visit: Payer: Self-pay

## 2022-02-11 ENCOUNTER — Other Ambulatory Visit: Payer: Self-pay | Admitting: Emergency Medicine

## 2022-02-11 ENCOUNTER — Other Ambulatory Visit (HOSPITAL_COMMUNITY): Payer: Self-pay

## 2022-02-11 DIAGNOSIS — I10 Essential (primary) hypertension: Secondary | ICD-10-CM

## 2022-02-11 DIAGNOSIS — I5032 Chronic diastolic (congestive) heart failure: Secondary | ICD-10-CM

## 2022-02-15 ENCOUNTER — Other Ambulatory Visit (HOSPITAL_COMMUNITY): Payer: Self-pay

## 2022-02-15 ENCOUNTER — Other Ambulatory Visit: Payer: Self-pay | Admitting: Family Medicine

## 2022-02-15 ENCOUNTER — Other Ambulatory Visit: Payer: Self-pay | Admitting: Emergency Medicine

## 2022-02-15 DIAGNOSIS — I5032 Chronic diastolic (congestive) heart failure: Secondary | ICD-10-CM

## 2022-02-15 DIAGNOSIS — I10 Essential (primary) hypertension: Secondary | ICD-10-CM

## 2022-02-15 DIAGNOSIS — Z720 Tobacco use: Secondary | ICD-10-CM

## 2022-02-15 MED ORDER — BUPROPION HCL ER (XL) 150 MG PO TB24
150.0000 mg | ORAL_TABLET | Freq: Every day | ORAL | 0 refills | Status: DC
  Filled 2022-02-15: qty 30, 30d supply, fill #0

## 2022-02-15 MED ORDER — PANTOPRAZOLE SODIUM 40 MG PO TBEC
40.0000 mg | DELAYED_RELEASE_TABLET | Freq: Every day | ORAL | 0 refills | Status: DC
  Filled 2022-02-15: qty 30, 30d supply, fill #0

## 2022-02-15 MED ORDER — ATORVASTATIN CALCIUM 40 MG PO TABS
20.0000 mg | ORAL_TABLET | Freq: Every day | ORAL | 0 refills | Status: DC
  Filled 2022-02-15: qty 15, 30d supply, fill #0

## 2022-02-15 NOTE — Telephone Encounter (Signed)
Requested medication (s) are due for refill today: yes  Requested medication (s) are on the active medication list: yes  Last refill:  atorvastatin: 11/09/21             pantoprazole and bupropion: 01/11/22  Future visit scheduled: yes  Notes to clinic:  overdue lab work and office visit   Requested Prescriptions  Pending Prescriptions Disp Refills   atorvastatin (LIPITOR) 40 MG tablet 15 tablet 0    Sig: Take 1/2 tablet (20 mg total) by mouth daily.     Cardiovascular:  Antilipid - Statins Failed - 02/15/2022  7:02 AM      Failed - Valid encounter within last 12 months    Recent Outpatient Visits           2 months ago Essential hypertension   [place], Charlane Ferretti, MD   1 year ago [place], Enobong, MD   1 year ago Screening for colon cancer   [place] Charlott Rakes, MD   2 years ago Viral upper respiratory tract infection   [place] Charlott Rakes, MD   2 years ago Screening for colon cancer   [place], Charlane Ferretti, MD       Future Appointments             In 2 weeks Charlott Rakes, MD [place]            Failed - Lipid Panel in normal range within the last 12 months    Cholesterol, Total  Date Value Ref Range Status  07/02/2019 248 (H) 100 - 199 mg/dL Final   LDL Chol Calc (NIH)  Date Value Ref Range Status  07/02/2019 155 (H) 0 - 99 mg/dL Final   HDL  Date Value Ref Range Status  07/02/2019 78 >39 mg/dL Final   Triglycerides  Date Value Ref Range Status  01/07/2021 88 <150 mg/dL Final    Comment:    Performed at [facility], [place] [address] [place],  [address]         Passed - Patient is not pregnant       pantoprazole (PROTONIX) 40 MG tablet 30 tablet 1    Sig: Take 1 tablet (40 mg total)  by mouth daily.     Gastroenterology: Proton Pump Inhibitors Failed - 02/15/2022  7:02 AM      Failed - Valid encounter within last 12 months    Recent Outpatient Visits           2 months ago Essential hypertension   [place] Charlott Rakes, MD   1 year ago [place], MD   1 year ago Screening for colon cancer   [place] Charlott Rakes, MD   2 years ago Viral upper respiratory tract infection   [place], Enobong, MD   2 years ago Screening for colon cancer   [place], Enobong, MD       Future Appointments             In 2 weeks Charlott Rakes, MD [place]             buPROPion [facility] XL)  150 MG 24 hr tablet 30 tablet 1    Sig: Take 1 tablet (150 mg total) by mouth daily. For smoking cessation     Psychiatry: Antidepressants - bupropion Failed - 02/15/2022  7:02 AM      Failed - Cr in normal range and within 360 days    Creatinine  Date Value Ref Range Status  05/28/2018 1.14 (H) 0.44 - 1.00 mg/dL Final   Creatinine, Ser  Date Value Ref Range Status  10/13/2021 1.42 (H) 0.44 - 1.00 mg/dL Final   Creatinine,U  Date Value Ref Range Status  10/23/2009 395.6 mg/dL Final    Comment:    See lab report for associated comment(s)         Failed - AST in normal range and within 360 days    AST  Date Value Ref Range Status  10/13/2021 14 (L) 15 - 41 U/L Final  05/28/2018 14 (L) 15 - 41 U/L Final         Failed - Completed PHQ-2 or PHQ-9 in the last 360 days      Failed - Last BP in normal range    BP Readings from Last 1 Encounters:  11/10/21 (!) 158/93         Failed - Valid encounter within last 6 months    Recent Outpatient Visits           2 months ago Essential hypertension   [place], Charlane Ferretti, MD   1 year ago [place], Enobong, MD   1 year ago Screening for colon cancer   [place] Charlott Rakes, MD   2 years ago Viral upper respiratory tract infection   [place], Enobong, MD   2 years ago Screening for colon cancer   [place], Enobong, MD       Future Appointments             In 2 weeks Charlott Rakes, MD [place] - ALT in normal range and within 360 days    ALT  Date Value Ref Range Status  10/13/2021 12 0 - 44 U/L Final  05/28/2018 12 0 - 44 U/L Final

## 2022-02-24 ENCOUNTER — Other Ambulatory Visit (HOSPITAL_COMMUNITY): Payer: Self-pay

## 2022-03-01 ENCOUNTER — Telehealth: Payer: Self-pay | Admitting: *Deleted

## 2022-03-01 NOTE — Patient Outreach (Signed)
  Care Coordination   03/01/2022 Name: Kemesha Mosey MRN: [id number] DOB: 1961-03-29   Care Coordination Outreach Attempts:  An unsuccessful telephone outreach was attempted today to offer the patient information about available care coordination services as a benefit of their health plan.   Follow Up Plan:  Additional outreach attempts will be made to offer the patient care coordination information and services.   Encounter Outcome:  No Answer  Care Coordination Interventions Activated:  No   Care Coordination Interventions:  No, not indicated    Emelia Loron RN, BSN [place] [phone] [email or web address]

## 2022-03-02 ENCOUNTER — Other Ambulatory Visit: Payer: Self-pay

## 2022-03-02 NOTE — Progress Notes (Signed)
Patient appearing on report for [facility] - Hypertension Control report due to last documented ambulatory blood pressure of 158/93 on 11/10/2021. Next appointment with PCP is 03/07/2022.   Outreached patient to discuss hypertension control and medication management. Unable to leave voicemail.  Joseph Art, Pharm.D. PGY-2 Ambulatory Care Pharmacy Resident 03/02/2022 11:26 AM

## 2022-03-03 ENCOUNTER — Telehealth: Payer: Self-pay | Admitting: Licensed Clinical Social Worker

## 2022-03-03 NOTE — Patient Outreach (Signed)
  Care Coordination   03/03/2022 Name: Angela Gardner MRN: [id number] DOB: Apr 04, 1961   Care Coordination Outreach Attempts:  A second unsuccessful outreach was attempted today to offer the patient with information about available care coordination services as a benefit of their health plan.     Follow Up Plan:  Additional outreach attempts will be made to offer the patient care coordination information and services.   Encounter Outcome:  No Answer  Care Coordination Interventions Activated:  No   Care Coordination Interventions:  No, not indicated    Christa See, MSW, [email or web address] Phone [phone] 11:14 AM

## 2022-03-07 ENCOUNTER — Encounter: Payer: Self-pay | Admitting: Family Medicine

## 2022-03-07 ENCOUNTER — Other Ambulatory Visit: Payer: Self-pay

## 2022-03-07 ENCOUNTER — Ambulatory Visit: Payer: Medicare Other | Attending: Family Medicine | Admitting: Family Medicine

## 2022-03-07 VITALS — BP 173/115 | HR 68 | Temp 98.4°F | Ht 64.0 in | Wt 222.8 lb

## 2022-03-07 DIAGNOSIS — Z720 Tobacco use: Secondary | ICD-10-CM

## 2022-03-07 DIAGNOSIS — I10 Essential (primary) hypertension: Secondary | ICD-10-CM

## 2022-03-07 DIAGNOSIS — I5032 Chronic diastolic (congestive) heart failure: Secondary | ICD-10-CM | POA: Diagnosis not present

## 2022-03-07 DIAGNOSIS — F331 Major depressive disorder, recurrent, moderate: Secondary | ICD-10-CM | POA: Diagnosis not present

## 2022-03-07 DIAGNOSIS — Z23 Encounter for immunization: Secondary | ICD-10-CM

## 2022-03-07 MED ORDER — AMLODIPINE BESYLATE 10 MG PO TABS
10.0000 mg | ORAL_TABLET | Freq: Every day | ORAL | 3 refills | Status: DC
  Filled 2022-03-07: qty 30, 30d supply, fill #0
  Filled 2022-08-24: qty 30, 30d supply, fill #1
  Filled 2022-09-16: qty 30, 30d supply, fill #2
  Filled 2022-10-17: qty 30, 30d supply, fill #3

## 2022-03-07 MED ORDER — BUPROPION HCL ER (XL) 150 MG PO TB24
150.0000 mg | ORAL_TABLET | Freq: Every day | ORAL | 3 refills | Status: DC
  Filled 2022-03-07: qty 30, 30d supply, fill #0

## 2022-03-07 MED ORDER — FUROSEMIDE 40 MG PO TABS
40.0000 mg | ORAL_TABLET | Freq: Two times a day (BID) | ORAL | 3 refills | Status: DC
  Filled 2022-03-07: qty 60, 30d supply, fill #0
  Filled 2022-08-24: qty 60, 30d supply, fill #1
  Filled 2022-09-16: qty 60, 30d supply, fill #2
  Filled 2022-10-17: qty 60, 30d supply, fill #3

## 2022-03-07 MED ORDER — FLUOXETINE HCL 20 MG PO CAPS
20.0000 mg | ORAL_CAPSULE | Freq: Every day | ORAL | 3 refills | Status: DC
  Filled 2022-03-07: qty 30, 30d supply, fill #0
  Filled 2022-05-06: qty 30, 30d supply, fill #1
  Filled 2022-08-08 – 2022-08-10 (×3): qty 30, 30d supply, fill #2
  Filled 2022-09-02: qty 30, 30d supply, fill #3

## 2022-03-07 MED ORDER — HYDRALAZINE HCL 50 MG PO TABS
75.0000 mg | ORAL_TABLET | Freq: Three times a day (TID) | ORAL | 0 refills | Status: DC
  Filled 2022-03-07: qty 135, 30d supply, fill #0

## 2022-03-07 MED ORDER — ATORVASTATIN CALCIUM 40 MG PO TABS
40.0000 mg | ORAL_TABLET | Freq: Every day | ORAL | 3 refills | Status: DC
  Filled 2022-03-07: qty 30, 30d supply, fill #0

## 2022-03-07 NOTE — Patient Instructions (Signed)
Major Depressive Disorder, Adult Major depressive disorder is a mental health condition. This disorder affects feelings. It can also affect the body. Symptoms of this condition last most of the day, almost every day, for 2 weeks. This disorder can affect: Relationships. Daily activities, such as work and school. Activities that you normally like to do. What are the causes? The cause of this condition is not known. The disorder is likely caused by a mix of things, including: Your personality, such as being a shy person. Your behavior, or how you act toward others. Your thoughts and feelings. Too much alcohol or drugs. How you react to stress. Health and mental problems that you have had for a long time. Things that hurt you in the past (trauma). Big changes in your life, such as divorce. What increases the risk? The following factors may make you more likely to develop this condition: Having family members with depression. Being a woman. Problems in the family. Low levels of some brain chemicals. Things that caused you pain as a child, especially if you lost a parent or were abused. A lot of stress in your life, such as from: Living without basic needs of life, such as food and shelter. Being treated poorly because of race, sex, or religion (discrimination). Health and mental problems that you have had for a long time. What are the signs or symptoms? The main symptoms of this condition are: Being sad all the time. Being grouchy all the time. Loss of interest in things and activities. Other symptoms include: Sleeping too much or too little. Eating too much or too little. Gaining or losing weight, without knowing why. Feeling tired or having low energy. Being restless and weak. Feeling hopeless, worthless, or guilty. Trouble thinking clearly or making decisions. Thoughts of hurting yourself or others, or thoughts of ending your life. Spending a lot of time alone. Inability to  complete common tasks of daily life. If you have very bad MDD, you may: Believe things that are not true. Hear, see, taste, or feel things that are not there. Have mild depression that lasts for at least 2 years. Feel very sad and hopeless. Have trouble speaking or moving. How is this treated? This condition may be treated with: Talk therapy. This teaches you to know bad thoughts, feelings, and actions and how to change them. This can also help you to communicate with others. This can be done with members of your family. Medicines. These can be used to treat worry (anxiety), depression, or low levels of chemicals in the brain. Lifestyle changes. You may need to: Limit alcohol use. Limit drug use. Get regular exercise. Get plenty of sleep. Make healthy eating choices. Spend more time outdoors. Brain stimulation. This treatment excites the brain. This is done when symptoms are very bad or have not gotten better with other treatments. Follow these instructions at home: Activity Get regular exercise as told. Spend time outdoors as told. Make time to do the things you enjoy. Find ways to deal with stress. Try to: Meditate. Do deep breathing. Spend time in nature. Keep a journal. Return to your normal activities as told by your doctor. Ask your doctor what activities are safe for you. Alcohol and drug use If you drink alcohol: Limit how much you use to: 0-1 drink a day for women. 0-2 drinks a day for men. Be aware of how much alcohol is in your drink. In the [place] one drink equals one 12 oz bottle of beer (355 mL),   one 5 oz glass of wine (148 mL), or one 1 oz glass of hard liquor (44 mL). Talk to your doctor about: Alcohol use. Alcohol can affect some medicines. Any drug use. General instructions  Take over-the-counter and prescription medicines and herbal preparations only as told by your doctor. Eat a healthy diet. Get a lot of sleep. Think about joining a support group.  Your doctor may be able to suggest one. Keep all follow-up visits as told by your doctor. This is important. Where to find more information: [employer] on Mental Illness: www.nami.[place]: https://carter.com/ American Psychiatric Association: www.psychiatry.org/patients-families/ Contact a doctor if: Your symptoms get worse. You get new symptoms. Get help right away if: You hurt yourself. You have serious thoughts about hurting yourself or others. You see, hear, taste, smell, or feel things that are not there. If you ever feel like you may hurt yourself or others, or have thoughts about taking your own life, get help right away. Go to your nearest emergency department or: Call your local emergency services (911 in the [place] Call a suicide crisis helpline, such as the [place] at 820-863-7452 or 988 in the [place]. This is open 24 hours a day in the [place] Text the Crisis Text Line at 905-705-0761 (in the [place].). Summary Major depressive disorder is a mental health condition. This disorder affects feelings. Symptoms of this condition last most of the day, almost every day, for 2 weeks. The symptoms of this disorder can cause problems with relationships and with daily activities. There are treatments and support for people who get this disorder. You may need more than one type of treatment. Get help right away if you have serious thoughts about hurting yourself or others. This information is not intended to replace advice given to you by your health care provider. Make sure you discuss any questions you have with your health care provider. Document Revised: 12/30/2020 Document Reviewed: 05/18/2019 Elsevier Patient Education  [name].

## 2022-03-07 NOTE — Progress Notes (Signed)
Subjective:  Patient ID: Angela Gardner, female    DOB: Apr 15, 1961  Age: 61 y.o. MRN: [id number]  CC: Hypertension (Follow up./Pain on R arm due to fall X71moago)   HPI DThanya Cegielskiis a 61y.o. year old female with a history of hypertension, stage III chronic kidney disease, diastolic CHF (EF 60 to 650%from 07/2018), Endometrial carcinoma diagnosed in 09/2017 s/p total abdominal hysterectomy and bilateral salpingo-oophorectomy on 11/14/2017, status post radiation and chemotherapy.   Interval History:   She has not been checking her BP at home but she states she has also been ingesting high sodium foods. Her BP is elevated and was also elevated at the visit in 10/2021.  She endorses taking her antihypertensive today.  She has had a lot of deaths in her family and is teary.  Also endorses being depressed.  She has no suicidal ideations or intents.  Complains of being dyspneic on some occasions and has had to use some months inhaler.  She continues to smoke and is on bupropion for smoking cessation. Past Medical History:  Diagnosis Date   Acute bronchitis 12/04/2008   Qualifier: Diagnosis of  By: VTomma LightningMD, KClaiborne Billings    Acute CHF (congestive heart failure) ([facility] 09/12/2017   Acute exacerbation of CHF (congestive heart failure) ([facility] 09/13/2017   Anemia    Antineoplastic chemotherapy induced anemia 04/05/2018   Cancer ([facility]    left ductal papilloma   Chest discomfort 09/12/2017   Negative stress test in May 2019   Chest pain 12/20/2019   CHF (congestive heart failure) (HCC)    Chronic kidney disease (CKD), stage III (moderate) 10/30/2017   Deficiency anemia 12/06/2017   Depression 153/97/6734  Diastolic congestive heart failure, NYHA class 3 ([facility] 10/27/2017   Diffuse pain 01/04/2018   Dysfunctional uterine bleeding 09/12/2017   Dyspnea    still having this and not moving around alot -gets worse with exertion   Endometrial cancer ([facility] 10/19/2017   Essential hypertension  10/27/2017   History of radiation therapy    Endometrium- 01/16/18-02/21/18- Dr. JGery Pray  History of sleep walking    Hx of left breast biopsy    Hyperlipemia    Hypertension    not on any medication now-[facility] had  prescribed her a med and she stopped taking them cannot remember when   Hypertensive crisis 019/37/9024  Illicit drug use 009/73/5329  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   Left knee pain    Mild renal insufficiency 09/12/2017   Morbid obesity ([facility] 10/09/2017   Other constipation 11/28/2017   OTITIS MEDIA 12/04/2008   Qualifier: Diagnosis of  By: VTomma LightningMD, KClaiborne Billings    Peripheral neuropathy due to chemotherapy ([facility] 01/04/2018   PMB (postmenopausal bleeding) 06/17/2015   Prediabetes 04/02/2018   Preventative health care 11/07/2018   Renal disorder    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   Vitamin D deficiency 02/15/2018    Past Surgical History:  Procedure Laterality Date   BREAST BIOPSY     left breast   CESAREAN SECTION     one   CHOLECYSTECTOMY     DILATION AND CURETTAGE OF UTERUS     approx 1 year ago   IR IMAGING GUIDED PORT INSERTION  12/06/2017   IR REMOVAL TUN ACCESS W/ PORT W/O FL MOD SED  06/19/2018   LYMPH NODE BIOPSY N/A 11/14/2017   Procedure: SENTINEL LYMPH NODE BIOPSY;  Surgeon: REveritt Amber MD;  Location:  [facility];  Service: Gynecology;  Laterality: N/A;   ROBOTIC ASSISTED TOTAL HYSTERECTOMY WITH BILATERAL SALPINGO OOPHERECTOMY N/A 11/14/2017   Procedure: XI ROBOTIC ASSISTED TOTAL HYSTERECTOMY WITH BILATERAL SALPINGO OOPHORECTOMY, RIGHT PELVIC LYMPHADENECTOMY;  Surgeon: Everitt Amber, MD;  Location: [facility];  Service: Gynecology;  Laterality: N/A;    Family History  Problem Relation Age of Onset   Anuerysm Mother    Congestive Heart Failure Mother    Hypertension Father    Hypertension Sister    Hypertension Brother    Colon cancer Neg Hx     Social History   Socioeconomic History   Marital  status: Single    Spouse name: Not on file   Number of children: 1   Years of education: Not on file   Highest education level: 9th grade  Occupational History   Not on file  Tobacco Use   Smoking status: Former    Packs/day: 0.30    Years: 26.00    Total pack years: 7.80    Types: Cigarettes   Smokeless tobacco: Never   Tobacco comments:    quit May 2019  Vaping Use   Vaping Use: Never used  Substance and Sexual Activity   Alcohol use: Not Currently    Comment: social drinking   Drug use: Yes    Types: Cocaine, "Crack" cocaine   Sexual activity: Not Currently    Birth control/protection: Post-menopausal  Other Topics Concern   Not on file  Social History Narrative   Not on file   Social Determinants of Health   Financial Resource Strain: Not on file  Food Insecurity: Not on file  Transportation Needs: No Transportation Needs (07/19/2018)   PRAPARE - Hydrologist (Medical): No    Lack of Transportation (Non-Medical): No  Physical Activity: Not on file  Stress: Not on file  Social Connections: Not on file    Allergies  Allergen Reactions   Vicodin [Hydrocodone-Acetaminophen] Itching    Outpatient Medications Prior to Visit  Medication Sig Dispense Refill   diphenhydrAMINE (BENADRYL) 25 mg capsule Take 25 mg by mouth every 6 (six) hours as needed for itching.     pantoprazole (PROTONIX) 40 MG tablet Take 1 tablet (40 mg total) by mouth daily. 30 tablet 0   atorvastatin (LIPITOR) 40 MG tablet Take 1/2 tablet (20 mg total) by mouth daily. 15 tablet 0   buPROPion (WELLBUTRIN XL) 150 MG 24 hr tablet Take 1 tablet (150 mg total) by mouth daily. For smoking cessation 30 tablet 0   furosemide (LASIX) 40 MG tablet Take 1 tablet (40 mg total) by mouth 2 (two) times daily. 60 tablet 0   amLODipine (NORVASC) 5 MG tablet Take 1 tablet (5 mg total) by mouth daily. 30 tablet 0   hydrALAZINE (APRESOLINE) 50 MG tablet Take 1.5 tablets (75 mg total) by  mouth 3 (three) times daily. 135 tablet 0   No facility-administered medications prior to visit.     ROS Review of Systems  Constitutional:  Negative for activity change and appetite change.  HENT:  Negative for sinus pressure and sore throat.   Respiratory:  Positive for shortness of breath. Negative for chest tightness and wheezing.   Cardiovascular:  Negative for chest pain and palpitations.  Gastrointestinal:  Negative for abdominal distention, abdominal pain and constipation.  Genitourinary: Negative.   Musculoskeletal: Negative.   Psychiatric/Behavioral:  Negative for behavioral problems and dysphoric mood.     Objective:  BP (!) 173/115 (BP Location:  Left Arm, Patient Position: Sitting, Cuff Size: Large)   Pulse 68   Temp 98.4 F (36.9 C) (Oral)   Ht _0  (1.626 m)   Wt 222 lb 12.8 oz (101.1 kg)   LMP  (LMP Unknown) Comment: vaginal bleeding continuously for last 8 months  SpO2 97%   BMI 38.24 kg/m      03/07/2022    4:06 PM 11/10/2021   11:20 AM 10/13/2021    2:30 PM  BP/Weight  Systolic BP 481 856 314  Diastolic BP 970 93 85  Wt. (Lbs) 222.8 222.9   BMI 38.24 kg/m2 38.26 kg/m2       Physical Exam Constitutional:      Appearance: She is well-developed.  Cardiovascular:     Rate and Rhythm: Normal rate.     Heart sounds: Normal heart sounds. No murmur heard. Pulmonary:     Effort: Pulmonary effort is normal.     Breath sounds: Normal breath sounds. No wheezing or rales.  Chest:     Chest wall: No tenderness.  Abdominal:     General: Bowel sounds are normal. There is no distension.     Palpations: Abdomen is soft. There is no mass.     Tenderness: There is no abdominal tenderness.  Musculoskeletal:        General: Normal range of motion.     Right lower leg: No edema.     Left lower leg: No edema.  Neurological:     Mental Status: She is alert and oriented to person, place, and time.  Psychiatric:     Comments: Dysphoric mood        Latest  Ref Rng & Units 10/13/2021   10:21 AM 01/11/2021    3:25 AM 01/10/2021    3:53 AM  CMP  Glucose 70 - 99 mg/dL 104  156  147   BUN 6 - 20 mg/dL 28  44  43   Creatinine 0.44 - 1.00 mg/dL 1.42  1.80  1.96   Sodium 135 - 145 mmol/L 142  136  137   Potassium 3.5 - 5.1 mmol/L 3.6  3.5  4.0   Chloride 98 - 111 mmol/L 112  103  105   CO2 22 - 32 mmol/L _1 Calcium 8.9 - 10.3 mg/dL 9.0  8.5  9.0   Total Protein 6.5 - 8.1 g/dL 7.0  6.5  6.9   Total Bilirubin 0.3 - 1.2 mg/dL 0.6  0.4  0.3   Alkaline Phos 38 - 126 U/L 109  85  82   AST 15 - 41 U/L _2 ALT 0 - 44 U/L _3 Lipid Panel     Component Value Date/Time   CHOL 248 (H) 07/02/2019 1135   TRIG 88 01/07/2021 2055   HDL 78 07/02/2019 1135   CHOLHDL 3.2 07/02/2019 1135   CHOLHDL 3.4 Ratio 03/30/2009 2039   VLDL 28 03/30/2009 2039   LDLCALC 155 (H) 07/02/2019 1135    CBC    Component Value Date/Time   WBC 4.7 10/13/2021 1021   RBC 4.86 10/13/2021 1021   HGB 14.1 10/13/2021 1021   HGB 9.6 (L) 05/28/2018 1135   HCT 43.9 10/13/2021 1021   PLT 243 10/13/2021 1021   PLT 152 05/28/2018 1135   MCV 90.3 10/13/2021 1021   MCH 29.0 10/13/2021 1021   MCHC 32.1 10/13/2021 1021   RDW 16.0 (H)  10/13/2021 1021   LYMPHSABS 1.7 10/13/2021 1021   MONOABS 0.3 10/13/2021 1021   EOSABS 0.1 10/13/2021 1021   BASOSABS 0.0 10/13/2021 1021    Lab Results  Component Value Date   HGBA1C 6.0 07/03/2018       03/07/2022    4:23 PM 07/02/2019    1:13 PM 07/03/2018   11:50 AM 04/09/2018   10:43 AM 04/02/2018   10:23 AM  Depression screen PHQ 2/9  Decreased Interest 3 0 _0 Down, Depressed, Hopeless 3 0 _1 PHQ - 2 Score 6 0 _2 Altered sleeping 3 0 _3 Tired, decreased energy 3 0 _4 Change in appetite 3 0 3 0 3  Feeling bad or failure about yourself  3 0 3 0 3  Trouble concentrating 0 0 0 0 3  Moving slowly or fidgety/restless 1 0 3 0 3  Suicidal thoughts 0 0 0 0 1  PHQ-9 Score 19 0 _5 Difficult doing work/chores    Somewhat difficult         03/07/2022    4:24 PM 07/02/2019    1:13 PM 07/03/2018   11:50 AM 04/02/2018   10:24 AM  GAD 7 : Generalized Anxiety Score  Nervous, Anxious, on Edge 3 0 0 2  Control/stop worrying 3 0 3 3  Worry too much - different things 3 0 3 3  Trouble relaxing 3 0 3 3  Restless 3 0 3 3  Easily annoyed or irritable 3 0 2 2  Afraid - awful might happen 3 0 2 0  Total GAD 7 Score 21 0 16 16     Assessment & Plan:  1. Essential hypertension Controlled Increased amlodipine dose We will follow-up on blood pressure at next visit Counseled on blood pressure goal of less than 130/80, low-sodium, DASH diet, medication compliance, 150 minutes of moderate intensity exercise per week. Discussed medication compliance, adverse effects. - CMP14+EGFR - amLODipine (NORVASC) 10 MG tablet; Take 1 tablet (10 mg total) by mouth daily.  Dispense: 30 tablet; Refill: 3 - hydrALAZINE (APRESOLINE) 50 MG tablet; Take 1.5 tablets (75 mg total) by mouth 3 (three) times daily.  Dispense: 135 tablet; Refill: 0  2. Moderate episode of recurrent major depressive disorder ([place]) Placed on Prozac Continue Wellbutrin LCSW called in for counseling  3. Chronic diastolic congestive heart failure, NYHA class 3 (HCC) EF of 60 to 65% from echo of 2020 We will check BNP and if normal consider chest x-ray - Brain natriuretic peptide - atorvastatin (LIPITOR) 40 MG tablet; Take 1 tablet (40 mg total) by mouth daily.  Dispense: 30 tablet; Refill: 3 - furosemide (LASIX) 40 MG tablet; Take 1 tablet (40 mg total) by mouth 2 (two) times daily.  Dispense: 60 tablet; Refill: 3  4. Tobacco abuse Counseled on smoking cessation and she is working on quitting - buPROPion (WELLBUTRIN XL) 150 MG 24 hr tablet; Take 1 tablet (150 mg total) by mouth daily. For smoking cessation  Dispense: 30 tablet; Refill: 3   Health Care Maintenance: Previously referred for colonoscopy and mammogram  but she never followed through with this. Meds ordered this encounter  Medications   FLUoxetine (PROZAC) 20 MG capsule    Sig: Take 1 capsule (20 mg total) by mouth daily.    Dispense:  30 capsule    Refill:  3   amLODipine (NORVASC) 10 MG tablet    Sig:  Take 1 tablet (10 mg total) by mouth daily.    Dispense:  30 tablet    Refill:  3    Dose increase   atorvastatin (LIPITOR) 40 MG tablet    Sig: Take 1 tablet (40 mg total) by mouth daily.    Dispense:  30 tablet    Refill:  3   buPROPion (WELLBUTRIN XL) 150 MG 24 hr tablet    Sig: Take 1 tablet (150 mg total) by mouth daily. For smoking cessation    Dispense:  30 tablet    Refill:  3   furosemide (LASIX) 40 MG tablet    Sig: Take 1 tablet (40 mg total) by mouth 2 (two) times daily.    Dispense:  60 tablet    Refill:  3   hydrALAZINE (APRESOLINE) 50 MG tablet    Sig: Take 1.5 tablets (75 mg total) by mouth 3 (three) times daily.    Dispense:  135 tablet    Refill:  0    Follow-up: Return in about 1 month (around 04/06/2022) for Chronic medical conditions.       Charlott Rakes, MD, FAAFP. [facility] [place], [place]   03/07/2022, 4:56 PM

## 2022-03-08 ENCOUNTER — Other Ambulatory Visit: Payer: Self-pay

## 2022-03-08 DIAGNOSIS — I10 Essential (primary) hypertension: Secondary | ICD-10-CM | POA: Diagnosis not present

## 2022-03-08 DIAGNOSIS — Z23 Encounter for immunization: Secondary | ICD-10-CM | POA: Diagnosis not present

## 2022-03-08 DIAGNOSIS — Z720 Tobacco use: Secondary | ICD-10-CM | POA: Diagnosis not present

## 2022-03-08 LAB — CMP14+EGFR
ALT: 23 IU/L (ref 0–32)
AST: 21 IU/L (ref 0–40)
Albumin/Globulin Ratio: 1.6 (ref 1.2–2.2)
Albumin: 4.9 g/dL (ref 3.8–4.9)
Alkaline Phosphatase: 66 IU/L (ref 44–121)
BUN/Creatinine Ratio: 12 (ref 9–23)
BUN: 12 mg/dL (ref 6–24)
Bilirubin Total: 0.3 mg/dL (ref 0.0–1.2)
CO2: 16 mmol/L — ABNORMAL LOW (ref 20–29)
Calcium: 9.7 mg/dL (ref 8.7–10.2)
Chloride: 104 mmol/L (ref 96–106)
Creatinine, Ser: 1.02 mg/dL — ABNORMAL HIGH (ref 0.57–1.00)
Globulin, Total: 3.1 g/dL (ref 1.5–4.5)
Glucose: 94 mg/dL (ref 70–99)
Potassium: 4.9 mmol/L (ref 3.5–5.2)
Sodium: 142 mmol/L (ref 134–144)
Total Protein: 8 g/dL (ref 6.0–8.5)
eGFR: 63 mL/min/{1.73_m2} (ref >59)

## 2022-03-08 LAB — BRAIN NATRIURETIC PEPTIDE: BNP: 99.1 pg/mL (ref 0.0–100.0)

## 2022-03-10 ENCOUNTER — Other Ambulatory Visit: Payer: Self-pay | Admitting: Family Medicine

## 2022-03-10 DIAGNOSIS — R0609 Other forms of dyspnea: Secondary | ICD-10-CM

## 2022-03-16 ENCOUNTER — Telehealth: Payer: Self-pay | Admitting: Licensed Clinical Social Worker

## 2022-03-16 NOTE — Telephone Encounter (Signed)
Pt returning call from Avie Arenas notified and she will call pt back when off the phone with current call.

## 2022-03-16 NOTE — Telephone Encounter (Signed)
Called patient today to speak with her about how she was doing since our last conversation. Patient and LCSWA talked during a warm hand off on 03/07/22. Pt was referred by PCP for being very tearful and possibly needing some grief counseling. LCSWA evaluated patients mental health status and stated that we will schedule an appointment. LCSWA called pt today but her voicemail was full.

## 2022-03-16 NOTE — Telephone Encounter (Signed)
Spoke with patient briefly. Patient would like resources for ongoing long term therapy. I sent her the same resources below in a MyChart message as well.  Counseling Resources   [email or web address]  [facility] (Therapy and psychiatry) [facility] (near [place]) [address], [place], [place] [address] [phone] Fax: [phone] (INSURANCE REQUIRED-MEDICAID ACCEPTED)   [place] (Therapy only)  [facility] [place] [address] [place], [place] [address] Monday - Friday: 8:30 a.m.-12 p.m. / 1 p.m.-2:30 p.m.  [facility] [address] [place], [place] [address] Monday-Friday: 8:30 a.m.-12 p.m. / 2-3:30 p.m. (INSURANCE REQUIRED -MEDICAID ACCEPTED) They do offer a sliding fee scale $20-$30/session  [facility] Counseling [place], [place] [address] Phone: [place] [address] [place] [address]  Phone: [phone] (Does not accept Medicaid) (only one provider accepts Medicare)  [facility] [address] (at [place], [place] [address] (Accepts Medicaid and Medicare)  [facility]  [place] [place]  [place], [place] [address]  Phone: [phone]  [address] [place], [place] [address] Phone: [phone] [facility] Medicaid) [facility] (Therapy only)  [address] [place], [place] [address] Phone: [phone]  [facility] [place] (Therapy only)  [place], [place] [address] Phone: [phone] [facility] & [facility])   [place] [address], [place], [place] [address] Phone: [phone] ([place]) [employer] [id number], [place] [address] Phone: [phone] [facility])  [place] (Psychiatry and therapy)  [place], [place], [place] [address] [phone] [facility] Medicare)  [place] (psychiatry and therapy) [address], [place], [place] [address] [phone]  [facility] [address], [place], [place], [place] [address] [phone] Accept [address], [place], [place], [place], [place],  and the following types of Medicaid; Alliance, [employer], Partners, [place], [place], [employer], Healthy [place], [place] Complete, and [place], as well as offering a Manufacturing systems engineer and private payment options. Provides In-Office Appointments, Virtual Appointments, and Phone Consultations Offers medication management for ages 19 years old and up, including,  Medication Management for Suboxone and [place] [phone] [address], [place], [place] [address] ([place] Medicaid and Medicare)         19.  [facility] (therapy only)  [address] [place], [place] [address]            [phone] (Accepts medicare) 22. [facility] [address] [place],  [address] [phone] (Accepts medicare)  For those who are tech savvy, go on psychology today, type in your local city (i.e. [place]. ) and specify your insurance at the top of the screen after you search. (Medicaid if needed). You can also specify whether you are interested in therapy and psychiatry.  www.psychologytoday.com/us

## 2022-03-29 ENCOUNTER — Ambulatory Visit: Payer: Self-pay

## 2022-03-29 NOTE — Patient Outreach (Signed)
  Care Coordination   03/29/2022 Name: Angela Gardner MRN: [id number] DOB: 11/15/1960   Care Coordination Outreach Attempts:  A third unsuccessful outreach was attempted today to offer the patient with information about available care coordination services as a benefit of their health plan.   Follow Up Plan:  No further outreach attempts will be made at this time. We have been unable to contact the patient to offer or enroll patient in care coordination services  Encounter Outcome:  No Answer  Care Coordination Interventions Activated:  No   Care Coordination Interventions:  No, not indicated    Daneen Schick, BSW, CDP Social Worker, Certified Dementia Practitioner [place] [facility] [phone]

## 2022-04-06 ENCOUNTER — Other Ambulatory Visit: Payer: Self-pay

## 2022-04-06 ENCOUNTER — Ambulatory Visit: Payer: Medicare Other | Attending: Family Medicine | Admitting: Family Medicine

## 2022-04-06 ENCOUNTER — Encounter: Payer: Self-pay | Admitting: Family Medicine

## 2022-04-06 VITALS — BP 171/117 | HR 88 | Ht 64.0 in | Wt 216.8 lb

## 2022-04-06 DIAGNOSIS — Z1211 Encounter for screening for malignant neoplasm of colon: Secondary | ICD-10-CM

## 2022-04-06 DIAGNOSIS — F331 Major depressive disorder, recurrent, moderate: Secondary | ICD-10-CM

## 2022-04-06 DIAGNOSIS — I10 Essential (primary) hypertension: Secondary | ICD-10-CM | POA: Diagnosis not present

## 2022-04-06 DIAGNOSIS — Z1231 Encounter for screening mammogram for malignant neoplasm of breast: Secondary | ICD-10-CM | POA: Diagnosis not present

## 2022-04-06 MED ORDER — HYDRALAZINE HCL 100 MG PO TABS
100.0000 mg | ORAL_TABLET | Freq: Three times a day (TID) | ORAL | 1 refills | Status: DC
  Filled 2022-04-06: qty 270, 90d supply, fill #0
  Filled 2022-08-24: qty 270, 90d supply, fill #1

## 2022-04-06 MED ORDER — PANTOPRAZOLE SODIUM 40 MG PO TBEC
40.0000 mg | DELAYED_RELEASE_TABLET | Freq: Every day | ORAL | 1 refills | Status: DC
  Filled 2022-04-06: qty 90, 90d supply, fill #0
  Filled 2022-08-24: qty 90, 90d supply, fill #1

## 2022-04-06 NOTE — Patient Instructions (Signed)
Increase hydralazine to 100 mg orally 3 times daily.

## 2022-04-06 NOTE — Progress Notes (Signed)
Subjective:  Patient ID: Angela Gardner, female    DOB: 1961/06/17  Age: 61 y.o. MRN: [id number]  CC: Hypertension   HPI Angela Gardner is a 61 y.o. year old female with a history of hypertension, stage III chronic kidney disease, diastolic CHF (EF 60 to 74% from 07/2018), Endometrial carcinoma diagnosed in 09/2017 s/p total abdominal hysterectomy and bilateral salpingo-oophorectomy on 11/14/2017, status post radiation and chemotherapy.   Interval History:  Amlodipine dose was increased at her last office visit due to uncontrolled blood pressure but BP is still elevated.  She had also complained of dyspnea at her last visit.  BNP was normal, chest x-ray ordered which she never underwent  She states her Prozac is working and her anxiety and Depression is controlled. Denies additional concerns. Past Medical History:  Diagnosis Date   Acute bronchitis 12/04/2008   Qualifier: Diagnosis of  By: Tomma Lightning MD, Claiborne Billings     Acute CHF (congestive heart failure) ([place]) 09/12/2017   Acute exacerbation of CHF (congestive heart failure) ([place]) 09/13/2017   Anemia    Antineoplastic chemotherapy induced anemia 04/05/2018   Cancer ([place])    left ductal papilloma   Chest discomfort 09/12/2017   Negative stress test in May 2019   Chest pain 12/20/2019   CHF (congestive heart failure) (HCC)    Chronic kidney disease (CKD), stage III (moderate) 10/30/2017   Deficiency anemia 12/06/2017   Depression 94/49/6759   Diastolic congestive heart failure, NYHA class 3 ([place]) 10/27/2017   Diffuse pain 01/04/2018   Dysfunctional uterine bleeding 09/12/2017   Dyspnea    still having this and not moving around alot -gets worse with exertion   Endometrial cancer ([place]) 10/19/2017   Essential hypertension 10/27/2017   History of radiation therapy    Endometrium- 01/16/18-02/21/18- Dr. 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   PMB (postmenopausal bleeding) 06/17/2015   Prediabetes 04/02/2018   Preventative health care 11/07/2018   Renal disorder    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   Vitamin D deficiency 02/15/2018    Past Surgical History:  Procedure Laterality Date   BREAST BIOPSY     left breast   CESAREAN SECTION     one   CHOLECYSTECTOMY     DILATION AND CURETTAGE OF UTERUS     approx 1 year ago   IR IMAGING GUIDED PORT INSERTION  12/06/2017   IR REMOVAL TUN ACCESS W/ PORT W/O FL MOD SED  06/19/2018   LYMPH NODE BIOPSY N/A 11/14/2017   Procedure: SENTINEL LYMPH NODE BIOPSY;  Surgeon: Everitt Amber, MD;  Location: [facility];  Service: Gynecology;  Laterality: N/A;   ROBOTIC ASSISTED TOTAL HYSTERECTOMY WITH BILATERAL SALPINGO OOPHERECTOMY N/A 11/14/2017   Procedure: XI ROBOTIC ASSISTED TOTAL HYSTERECTOMY WITH BILATERAL SALPINGO OOPHORECTOMY, RIGHT PELVIC LYMPHADENECTOMY;  Surgeon: Everitt Amber, MD;  Location: [facility];  Service: Gynecology;  Laterality: N/A;    Family History  Problem Relation Age of Onset   Anuerysm Mother    Congestive Heart Failure Mother    Hypertension Father    Hypertension Sister    Hypertension Brother    Colon cancer Neg Hx     Social History   Socioeconomic History   Marital status: Single    Spouse name: Not on file   Number of children: 1   Years of education: Not on file   Highest education level: 9th grade  Occupational History   Not on file  Tobacco Use   Smoking status:  Former    Packs/day: 0.30    Years: 26.00    Total pack years: 7.80    Types: Cigarettes   Smokeless tobacco: Never   Tobacco comments:    quit May 2019  Vaping Use   Vaping Use: Never used  Substance and Sexual Activity   Alcohol use: Not Currently    Comment: social drinking   Drug use: Yes    Types: Cocaine, "Crack" cocaine   Sexual activity: Not Currently    Birth control/protection: Post-menopausal  Other Topics Concern   Not on file  Social History Narrative   Not on file   Social Determinants of Health   Financial Resource Strain: Not on file  Food Insecurity: Not on file  Transportation Needs: No Transportation Needs (07/19/2018)   PRAPARE - Hydrologist (Medical): No    Lack of Transportation (Non-Medical): No  Physical Activity: Not on file  Stress: Not on file  Social Connections: Not on file    Allergies  Allergen Reactions   Vicodin [Hydrocodone-Acetaminophen] Itching    Outpatient Medications Prior to Visit  Medication Sig Dispense Refill   amLODipine (NORVASC) 10 MG tablet Take 1 tablet (10 mg total) by mouth daily. 30 tablet 3   atorvastatin (LIPITOR) 40 MG tablet Take 1 tablet (40 mg total) by mouth daily. 30 tablet 3   buPROPion (WELLBUTRIN XL) 150 MG 24 hr tablet Take 1 tablet (150 mg total) by mouth daily for smoking cessation 30 tablet 3   diphenhydrAMINE (BENADRYL) 25 mg capsule Take 25 mg by mouth every 6 (six) hours as needed for itching.     FLUoxetine (PROZAC) 20 MG capsule Take 1 capsule (20 mg total) by mouth daily. 30 capsule 3   furosemide (LASIX) 40 MG tablet Take 1 tablet (40 mg total) by mouth 2 (two) times daily. 60 tablet 3   hydrALAZINE (APRESOLINE) 50 MG tablet Take 1.5 tablets (75 mg total) by mouth 3 (three) times daily. 135 tablet 0   pantoprazole (PROTONIX) 40 MG tablet Take 1 tablet (40 mg total) by mouth daily. 30 tablet 0   No facility-administered medications prior to visit.     ROS Review  of Systems  Constitutional:  Negative for activity change and appetite change.  HENT:  Negative for sinus pressure and sore throat.   Respiratory:  Negative for chest tightness, shortness of breath and wheezing.   Cardiovascular:  Negative for chest pain and palpitations.  Gastrointestinal:  Negative for abdominal distention, abdominal pain and constipation.  Genitourinary: Negative.   Musculoskeletal: Negative.   Psychiatric/Behavioral:  Negative for behavioral problems and dysphoric mood.     Objective:  BP (!) 171/117   Pulse 88   Ht '5\' 4"'$  (1.626 m)   Wt 216 lb 12.8 oz (98.3 kg)   LMP  (LMP Unknown) Comment: vaginal bleeding continuously for last 8  months  SpO2 97%   BMI 37.21 kg/m      04/06/2022    3:47 PM 04/06/2022    2:45 PM 03/07/2022    4:06 PM  BP/Weight  Systolic BP 993 570 177  Diastolic BP 939 030 092  Wt. (Lbs)  216.8 222.8  BMI  37.21 kg/m2 38.24 kg/m2      Physical Exam Constitutional:      Appearance: She is well-developed.  Cardiovascular:     Rate and Rhythm: Normal rate.     Heart sounds: Normal heart sounds. No murmur heard. Pulmonary:     Effort: Pulmonary effort is normal.     Breath sounds: Normal breath sounds. No wheezing or rales.  Chest:     Chest wall: No tenderness.  Abdominal:     General: Bowel sounds are normal. There is no distension.     Palpations: Abdomen is soft. There is no mass.     Tenderness: There is no abdominal tenderness.  Musculoskeletal:        General: Normal range of motion.     Right lower leg: No edema.     Left lower leg: No edema.  Neurological:     Mental Status: She is alert and oriented to person, place, and time.  Psychiatric:        Mood and Affect: Mood normal.        Latest Ref Rng & Units 03/07/2022    4:41 PM 10/13/2021   10:21 AM 01/11/2021    3:25 AM  CMP  Glucose 70 - 99 mg/dL 94  104  156   BUN 6 - 24 mg/dL 12  28  44   Creatinine 0.57 - 1.00 mg/dL 1.02  1.42  1.80   Sodium 134 - 144  mmol/L 142  142  136   Potassium 3.5 - 5.2 mmol/L 4.9  3.6  3.5   Chloride 96 - 106 mmol/L 104  112  103   CO2 20 - 29 mmol/L '16  23  24   '$ Calcium 8.7 - 10.2 mg/dL 9.7  9.0  8.5   Total Protein 6.0 - 8.5 g/dL 8.0  7.0  6.5   Total Bilirubin 0.0 - 1.2 mg/dL 0.3  0.6  0.4   Alkaline Phos 44 - 121 IU/L 66  109  85   AST 0 - 40 IU/L '21  14  15   '$ ALT 0 - 32 IU/L '23  12  16     '$ Lipid Panel     Component Value Date/Time   CHOL 248 (H) 07/02/2019 1135   TRIG 88 01/07/2021 2055   HDL 78 07/02/2019 1135   CHOLHDL 3.2 07/02/2019 1135   CHOLHDL 3.4 Ratio 03/30/2009 2039   VLDL 28 03/30/2009 2039   LDLCALC 155 (H) 07/02/2019 1135    CBC    Component Value Date/Time   WBC 4.7 10/13/2021 1021   RBC 4.86 10/13/2021 1021   HGB 14.1 10/13/2021 1021   HGB 9.6 (L) 05/28/2018 1135   HCT 43.9 10/13/2021 1021   PLT 243 10/13/2021 1021   PLT 152 05/28/2018 1135   MCV 90.3 10/13/2021 1021   MCH 29.0 10/13/2021 1021   MCHC 32.1 10/13/2021 1021   RDW 16.0 (H) 10/13/2021 1021   LYMPHSABS 1.7 10/13/2021 1021   MONOABS 0.3 10/13/2021 1021   EOSABS 0.1 10/13/2021 1021   BASOSABS 0.0 10/13/2021 1021    Lab Results  Component Value Date   HGBA1C 6.0 07/03/2018  Assessment & Plan:  1. Essential hypertension Uncontrolled Increase Hydralazine dose Counseled on blood pressure goal of less than 130/80, low-sodium, DASH diet, medication compliance, 150 minutes of moderate intensity exercise per week. Discussed medication compliance, adverse effects. - hydrALAZINE (APRESOLINE) 100 MG tablet; Take 1 tablet (100 mg total) by mouth 3 (three) times daily.  Dispense: 270 tablet; Refill: 1  2. Encounter for screening mammogram for malignant neoplasm of breast - MM 3D SCREEN BREAST BILATERAL; Future  3. Screening for colon cancer - Ambulatory referral to Gastroenterology  4. Moderate episode of recurrent major depressive disorder (HCC) Controlled Continue Prozac   Meds ordered this encounter   Medications   hydrALAZINE (APRESOLINE) 100 MG tablet    Sig: Take 1 tablet (100 mg total) by mouth 3 (three) times daily.    Dispense:  270 tablet    Refill:  1    Dose increase   pantoprazole (PROTONIX) 40 MG tablet    Sig: Take 1 tablet (40 mg total) by mouth daily.    Dispense:  90 tablet    Refill:  1    Follow-up: Return in about 3 months (around 07/07/2022) for Chronic medical conditions.       Charlott Rakes, MD, FAAFP. [facility], [place]   04/08/2022, 11:51 AM

## 2022-04-08 ENCOUNTER — Encounter: Payer: Self-pay | Admitting: Family Medicine

## 2022-04-10 NOTE — Progress Notes (Signed)
Radiation Oncology         [phone] ________________________________  Name: Angela Gardner MRN: [id number]  Date: 04/11/2022  DOB: 03-Aug-1960  Follow-Up Visit Note  CC: Charlott Rakes, MD  Everitt Amber, MD  No diagnosis found.  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 Since her last visit, the patient presented to the ED on 10/13/21 for evaluation of hypertension. She reported that she stopped all her medicines a week prior due to running out of them and not liking the way her medications made her feel. She was given her usual blood pressure medications in the ED without improvement, and was subsequently administered a dose of IV hydralazine given continued blood pressures in the 485 systolic range.   The patient most recently followed up with Dr. Delsa Sale on (gyn-onc) on 11/10/21. During which time, the patient denied any symptoms concerning for disease recurrence and was noted as NED on examination.              ***                 Allergies:  is allergic to vicodin [hydrocodone-acetaminophen].  Meds: Current Outpatient Medications  Medication Sig Dispense Refill   amLODipine (NORVASC) 10 MG tablet Take 1 tablet (10 mg total) by mouth daily. 30 tablet 3   atorvastatin (LIPITOR) 40 MG tablet Take 1 tablet (40 mg total) by mouth daily. 30 tablet 3   buPROPion (WELLBUTRIN XL) 150 MG 24 hr tablet Take 1 tablet (150 mg total) by mouth daily for smoking cessation 30 tablet 3   diphenhydrAMINE (BENADRYL) 25 mg capsule Take 25 mg by mouth every 6 (six) hours as needed for itching.     FLUoxetine (PROZAC) 20 MG capsule Take 1 capsule  (20 mg total) by mouth daily. 30 capsule 3   furosemide (LASIX) 40 MG tablet Take 1 tablet (40 mg total) by mouth 2 (two) times daily. 60 tablet 3   hydrALAZINE (APRESOLINE) 100 MG tablet Take 1 tablet (100 mg total) by mouth 3 (three) times daily. 270 tablet 1   pantoprazole (PROTONIX) 40 MG tablet Take 1 tablet (40 mg total) by mouth daily. 90 tablet 1   No current facility-administered medications for this encounter.    Physical Findings: The patient is in no acute distress. Patient is alert and oriented.  vitals were not taken for this visit. .  No significant changes. Lungs are clear to auscultation bilaterally. Heart has regular rate and rhythm. No palpable cervical, supraclavicular, or axillary adenopathy. Abdomen soft, non-tender, normal bowel sounds.  On pelvic examination the external genitalia were unremarkable. A speculum exam was performed. There are no mucosal lesions noted in the vaginal vault. A Pap smear was obtained of the proximal vagina. On bimanual and rectovaginal examination there were no pelvic masses appreciated. ***  Lab Findings: Lab Results  Component Value Date   WBC 4.7 10/13/2021   HGB 14.1 10/13/2021   HCT 43.9 10/13/2021   MCV 90.3 10/13/2021   PLT 243 10/13/2021    Radiographic Findings: No results found.  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  ***  Plan:  ***   *** minutes of total time was spent for this patient encounter, including preparation, face-to-face counseling with the patient and coordination of care, physical exam, and documentation of the encounter. ____________________________________  Blair Promise, PhD, MD  This document serves as a record of services personally performed by Gery Pray, MD. It was created on his behalf by Roney Mans, a trained medical scribe. The creation of this record is based on the  scribe's personal observations and the provider's statements to them. This document has been checked and approved by the attending provider.

## 2022-04-11 ENCOUNTER — Ambulatory Visit
Admission: RE | Admit: 2022-04-11 | Discharge: 2022-04-11 | Disposition: A | Discharge status: Home / Self Care | Payer: Commercial Indemnity | Source: Ambulatory Visit | Attending: Radiation Oncology | Admitting: Radiation Oncology

## 2022-04-11 VITALS — BP 182/101 | HR 91 | Temp 97.7°F | Resp 20 | Ht 64.0 in | Wt 217.0 lb

## 2022-04-11 DIAGNOSIS — Z79899 Other long term (current) drug therapy: Secondary | ICD-10-CM | POA: Diagnosis not present

## 2022-04-11 DIAGNOSIS — Z8542 Personal history of malignant neoplasm of other parts of uterus: Secondary | ICD-10-CM | POA: Diagnosis present

## 2022-04-11 DIAGNOSIS — Z923 Personal history of irradiation: Secondary | ICD-10-CM | POA: Diagnosis not present

## 2022-04-11 DIAGNOSIS — C541 Malignant neoplasm of endometrium: Secondary | ICD-10-CM

## 2022-04-11 NOTE — Progress Notes (Signed)
Jiali Linney is here today for follow up post radiation to the pelvic.  They completed their radiation on: 02/21/2018   Does the patient complain of any of the following:  Pain:Reports having pain in her right upper arm and leg from a past fall.  She states it has been happening for awhile. Abdominal bloating: no Diarrhea/Constipation: reports having occasional diarrhea. Nausea/Vomiting: no Vaginal Discharge: no Blood in Urine or Stool: no Urinary Issues (dysuria/incomplete emptying/ incontinence/ increased frequency/urgency): no Does patient report using vaginal dilator 2-3 times a week and/or sexually active 2-3 weeks: no Post radiation skin changes: no   Additional comments if applicable: Reports having a cold for about two weeks.  She has tested negative for Covid.  Her BP is elevated at 182/101.  She states it is from the cold.  BP (!) 182/101 (BP Location: Left Arm, Patient Position: Sitting, Cuff Size: Large)   Pulse 91   Temp 97.7 F (36.5 C)   Resp 20   Ht '5\' 4"'$  (1.626 m)   Wt 217 lb (98.4 kg)   LMP  (LMP Unknown) Comment: vaginal bleeding continuously for last 8 months  SpO2 100%   BMI 37.25 kg/m

## 2022-04-23 ENCOUNTER — Encounter (HOSPITAL_COMMUNITY): Payer: Self-pay | Admitting: Emergency Medicine

## 2022-04-23 ENCOUNTER — Other Ambulatory Visit: Payer: Self-pay

## 2022-04-23 ENCOUNTER — Emergency Department (HOSPITAL_COMMUNITY)
Admission: EM | Admit: 2022-04-23 | Discharge: 2022-04-24 | Disposition: A | Discharge status: Home / Self Care | Payer: Medicare Other | Attending: Emergency Medicine | Admitting: Emergency Medicine

## 2022-04-23 ENCOUNTER — Emergency Department (HOSPITAL_COMMUNITY): Payer: Medicare Other

## 2022-04-23 DIAGNOSIS — J069 Acute upper respiratory infection, unspecified: Secondary | ICD-10-CM | POA: Insufficient documentation

## 2022-04-23 DIAGNOSIS — Z79899 Other long term (current) drug therapy: Secondary | ICD-10-CM | POA: Insufficient documentation

## 2022-04-23 DIAGNOSIS — I509 Heart failure, unspecified: Secondary | ICD-10-CM | POA: Insufficient documentation

## 2022-04-23 DIAGNOSIS — I130 Hypertensive heart and chronic kidney disease with heart failure and stage 1 through stage 4 chronic kidney disease, or unspecified chronic kidney disease: Secondary | ICD-10-CM | POA: Diagnosis not present

## 2022-04-23 DIAGNOSIS — Z77120 Contact with and (suspected) exposure to mold (toxic): Secondary | ICD-10-CM | POA: Insufficient documentation

## 2022-04-23 DIAGNOSIS — Z8542 Personal history of malignant neoplasm of other parts of uterus: Secondary | ICD-10-CM | POA: Diagnosis not present

## 2022-04-23 DIAGNOSIS — Z1152 Encounter for screening for COVID-19: Secondary | ICD-10-CM | POA: Insufficient documentation

## 2022-04-23 DIAGNOSIS — N189 Chronic kidney disease, unspecified: Secondary | ICD-10-CM | POA: Insufficient documentation

## 2022-04-23 DIAGNOSIS — I10 Essential (primary) hypertension: Secondary | ICD-10-CM

## 2022-04-23 DIAGNOSIS — R079 Chest pain, unspecified: Secondary | ICD-10-CM | POA: Diagnosis not present

## 2022-04-23 DIAGNOSIS — R7989 Other specified abnormal findings of blood chemistry: Secondary | ICD-10-CM | POA: Insufficient documentation

## 2022-04-23 DIAGNOSIS — R6883 Chills (without fever): Secondary | ICD-10-CM | POA: Diagnosis not present

## 2022-04-23 DIAGNOSIS — R059 Cough, unspecified: Secondary | ICD-10-CM | POA: Diagnosis not present

## 2022-04-23 LAB — RESP PANEL BY RT-PCR (FLU A&B, COVID) ARPGX2
Influenza A by PCR: NEGATIVE
Influenza B by PCR: NEGATIVE
SARS Coronavirus 2 by RT PCR: NEGATIVE

## 2022-04-23 LAB — COMPREHENSIVE METABOLIC PANEL
ALT: 12 U/L (ref 0–44)
AST: 14 U/L — ABNORMAL LOW (ref 15–41)
Albumin: 3.5 g/dL (ref 3.5–5.0)
Alkaline Phosphatase: 100 U/L (ref 38–126)
Anion gap: 6 (ref 5–15)
BUN: 26 mg/dL — ABNORMAL HIGH (ref 6–20)
CO2: 26 mmol/L (ref 22–32)
Calcium: 8.7 mg/dL — ABNORMAL LOW (ref 8.9–10.3)
Chloride: 109 mmol/L (ref 98–111)
Creatinine, Ser: 2.04 mg/dL — ABNORMAL HIGH (ref 0.44–1.00)
GFR, Estimated: 27 mL/min — ABNORMAL LOW (ref >60)
Glucose, Bld: 101 mg/dL — ABNORMAL HIGH (ref 70–99)
Potassium: 3.9 mmol/L (ref 3.5–5.1)
Sodium: 141 mmol/L (ref 135–145)
Total Bilirubin: 0.3 mg/dL (ref 0.3–1.2)
Total Protein: 6.8 g/dL (ref 6.5–8.1)

## 2022-04-23 LAB — CBC WITH DIFFERENTIAL/PLATELET
Abs Immature Granulocytes: 0.02 10*3/uL (ref 0.00–0.07)
Basophils Absolute: 0 10*3/uL (ref 0.0–0.1)
Basophils Relative: 1 %
Eosinophils Absolute: 0.1 10*3/uL (ref 0.0–0.5)
Eosinophils Relative: 3 %
HCT: 39.9 % (ref 36.0–46.0)
Hemoglobin: 12.4 g/dL (ref 12.0–15.0)
Immature Granulocytes: 0 %
Lymphocytes Relative: 34 %
Lymphs Abs: 1.9 10*3/uL (ref 0.7–4.0)
MCH: 28.8 pg (ref 26.0–34.0)
MCHC: 31.1 g/dL (ref 30.0–36.0)
MCV: 92.6 fL (ref 80.0–100.0)
Monocytes Absolute: 0.5 10*3/uL (ref 0.1–1.0)
Monocytes Relative: 9 %
Neutro Abs: 2.9 10*3/uL (ref 1.7–7.7)
Neutrophils Relative %: 53 %
Platelets: 223 10*3/uL (ref 150–400)
RBC: 4.31 MIL/uL (ref 3.87–5.11)
RDW: 15 % (ref 11.5–15.5)
WBC: 5.5 10*3/uL (ref 4.0–10.5)
nRBC: 0 % (ref 0.0–0.2)

## 2022-04-23 LAB — TROPONIN I (HIGH SENSITIVITY)
Troponin I (High Sensitivity): 29 ng/L — ABNORMAL HIGH (ref <18)
Troponin I (High Sensitivity): 26 ng/L — ABNORMAL HIGH (ref <18)

## 2022-04-23 LAB — BRAIN NATRIURETIC PEPTIDE: B Natriuretic Peptide: 112.4 pg/mL — ABNORMAL HIGH (ref 0.0–100.0)

## 2022-04-23 MED ORDER — AMLODIPINE BESYLATE 5 MG PO TABS
10.0000 mg | ORAL_TABLET | Freq: Every day | ORAL | Status: DC
  Administered 2022-04-23: 10 mg via ORAL
  Filled 2022-04-23: qty 2

## 2022-04-23 MED ORDER — HYDRALAZINE HCL 25 MG PO TABS
100.0000 mg | ORAL_TABLET | Freq: Three times a day (TID) | ORAL | Status: DC
  Administered 2022-04-23: 100 mg via ORAL
  Filled 2022-04-23: qty 4

## 2022-04-23 MED ORDER — FUROSEMIDE 40 MG PO TABS: 40.0000 mg | ORAL_TABLET | Freq: Two times a day (BID) | ORAL | Status: DC

## 2022-04-23 NOTE — ED Triage Notes (Signed)
Patient reports "black mold, doesn't feel well, body aches, head hurts, can't see, pooping a lot" says  Says mold has been trailer 5 years and she is spitting up black stuff Endorses smoking Says she smokes to stay away so she wont die.

## 2022-04-23 NOTE — ED Notes (Signed)
Patient is a cancer patient   she states her port was discontinued

## 2022-04-23 NOTE — ED Provider Notes (Signed)
[facility] DEPT Provider Note   CSN: [id number] Arrival date & time: 04/23/22  1434     History  Chief Complaint  Patient presents with   URI    Angela Gardner is a 61 y.o. female.  Patient complains of a cough and congestion.  Patient reports that she feels like she is sick because she is exposed to black mold.  Patient states she lives with her sister and the home that she is living in has mold all through the house.  Patient reports she has a past medical history of congestive heart failure and kidney disease.  Patient reports she had endometrial  cancer in 2019.  She reports feeling like she has become rundown from being exposed to mold.  Patient states she is planning to move.  The history is provided by the patient. No language interpreter was used.  URI Presenting symptoms: congestion and cough   Timing:  Constant Progression:  Worsening Relieved by:  Nothing Worsened by:  Nothing Ineffective treatments:  None tried Associated symptoms: no arthralgias   Risk factors: chronic cardiac disease, chronic kidney disease and sick contacts   Risk factors: no recent illness        Home Medications Prior to Admission medications   Medication Sig Start Date End Date Taking? Authorizing Provider  amLODipine (NORVASC) 10 MG tablet Take 1 tablet (10 mg total) by mouth daily. 03/07/22   Charlott Rakes, MD  atorvastatin (LIPITOR) 40 MG tablet Take 1 tablet (40 mg total) by mouth daily. 03/07/22   Charlott Rakes, MD  buPROPion (WELLBUTRIN XL) 150 MG 24 hr tablet Take 1 tablet (150 mg total) by mouth daily for smoking cessation 03/07/22   Charlott Rakes, MD  diphenhydrAMINE (BENADRYL) 25 mg capsule Take 25 mg by mouth every 6 (six) hours as needed for itching.    [provider]  FLUoxetine (PROZAC) 20 MG capsule Take 1 capsule (20 mg total) by mouth daily. 03/07/22   Charlott Rakes, MD  furosemide (LASIX) 40 MG tablet Take 1 tablet (40 mg  total) by mouth 2 (two) times daily. 03/07/22   Charlott Rakes, MD  hydrALAZINE (APRESOLINE) 100 MG tablet Take 1 tablet (100 mg total) by mouth 3 (three) times daily. 04/06/22   Charlott Rakes, MD  pantoprazole (PROTONIX) 40 MG tablet Take 1 tablet (40 mg total) by mouth daily. 04/06/22   Charlott Rakes, MD  prochlorperazine (COMPAZINE) 10 MG tablet Take 1 tablet (10 mg total) by mouth every 6 (six) hours as needed (Nausea or vomiting). Patient not taking: Reported on 05/25/2018 12/08/17 05/29/18  Heath Lark, MD      Allergies    Vicodin [hydrocodone-acetaminophen]    Review of Systems   Review of Systems  HENT:  Positive for congestion.   Respiratory:  Positive for cough.   Musculoskeletal:  Negative for arthralgias.  All other systems reviewed and are negative.   Physical Exam Updated Vital Signs BP 138/82   Pulse 73   Temp 97.6 F (36.4 C) (Oral)   Resp 18   Ht '5\' 4"'$  (1.626 m)   Wt 98.4 kg   LMP  (LMP Unknown) Comment: vaginal bleeding continuously for last 8 months  SpO2 96%   BMI 37.25 kg/m  Physical Exam Vitals and nursing note reviewed.  Constitutional:      Appearance: She is well-developed.  HENT:     Head: Normocephalic.     Right Ear: External ear normal.     Left Ear: External ear  normal.     Nose: Congestion present.  Cardiovascular:     Rate and Rhythm: Normal rate and regular rhythm.  Pulmonary:     Effort: Pulmonary effort is normal.  Abdominal:     General: Abdomen is flat. There is no distension.  Musculoskeletal:        General: Normal range of motion.     Cervical back: Normal range of motion.  Skin:    General: Skin is warm.  Neurological:     General: No focal deficit present.     Mental Status: She is alert and oriented to person, place, and time.     ED Results / Procedures / Treatments   Labs (all labs ordered are listed, but only abnormal results are displayed) Labs Reviewed  COMPREHENSIVE METABOLIC PANEL - Abnormal; Notable for  the following components:      Result Value   Glucose, Bld 101 (*)    BUN 26 (*)    Creatinine, Ser 2.04 (*)    Calcium 8.7 (*)    AST 14 (*)    GFR, Estimated 27 (*)    All other components within normal limits  BRAIN NATRIURETIC PEPTIDE - Abnormal; Notable for the following components:   B Natriuretic Peptide 112.4 (*)    All other components within normal limits  TROPONIN I (HIGH SENSITIVITY) - Abnormal; Notable for the following components:   Troponin I (High Sensitivity) 29 (*)    All other components within normal limits  RESP PANEL BY RT-PCR (FLU A&B, COVID) ARPGX2  CBC WITH DIFFERENTIAL/PLATELET  TROPONIN I (HIGH SENSITIVITY)    EKG None  Radiology DG Chest 2 View  Result Date: 04/23/2022 CLINICAL DATA:  Cough chills and chest pain. EXAM: CHEST - 2 VIEW COMPARISON:  July of 2022. FINDINGS: Moderate to marked cardiomegaly similar to previous imaging. No sign of pleural effusion, pulmonary edema, consolidation or pneumothorax. On limited assessment no acute skeletal findings. IMPRESSION: Moderate to marked cardiomegaly without acute cardiopulmonary disease. Electronically Signed   By: Zetta Bills M.D.   On: 04/23/2022 16:01    Procedures Procedures    Medications Ordered in ED Medications - No data to display  ED Course/ Medical Decision Making/ A&P                           Medical Decision Making Complains of a cough and congestion.  Amount and/or Complexity of Data Reviewed External Data Reviewed: labs and notes.    Details: Internal medicine notes reviewed Labs: ordered. Decision-making details documented in ED Course.    Details: Labs ordered reviewed and interpreted.  BUN and creatinine are evaded but are near patient's baseline.  Troponin is slightly elevated at 29 Troponin is 29 and 26.   COVID and influenza are negative Radiology: ordered and independent interpretation performed. Decision-making details documented in ED Course.    Details: X-ray  shows cardiomegaly  Risk Risk Details: Counseled on her results patient's blood pressure has trended upwards since she has been in the emergency department.  Patient reports that she took her blood pressure medicines last night.  Patient reports she normally takes her medicines in the morning but she has gotten off schedule.  Patient has not taken any medication for her blood pressure today.  Patient is given her oral doses of medication           Final Clinical Impression(s) / ED Diagnoses Final diagnoses:  Upper respiratory tract infection, unspecified type  Mold exposure  Primary hypertension    Rx / DC Orders ED Discharge Orders     None      Pt's care turned over to oncoming provider.    Sidney Ace 04/23/22 2206    Valarie Merino, MD 04/24/22 1301

## 2022-04-23 NOTE — ED Notes (Signed)
Waiting for Hydralazine to be verified by pharm.

## 2022-04-23 NOTE — ED Provider Triage Note (Signed)
Emergency Medicine Provider Triage Evaluation Note  Angela Gardner , a 61 y.o. female  was evaluated in triage.  Pt complains of chills, cough, chest pain, shortness of breath, Myalgias/arthralgias, black mold exposure for the past 3 weeks..  Patient states that she has been living in a trailer that is open to the outside.  She reports area has significant mount of black mold.  She states that she has been fearful for her life and has not been sleeping very well for fear that she is going to die.  She endorses smoking crack so that she can stay awake.  Last inhalation of crack was last night around 8-9 PM.  Patient is tearful and requesting not to go back to her trailer multiple times.  Review of Systems  Positive: See above Negative:   Physical Exam  BP (!) 162/85 (BP Location: Left Arm)   Pulse 78   Temp 98.2 F (36.8 C) (Oral)   Resp 16   Ht '5\' 4"'$  (1.626 m)   Wt 98.4 kg   LMP  (LMP Unknown) Comment: vaginal bleeding continuously for last 8 months  SpO2 100%   BMI 37.25 kg/m  Gen:   Awake, no distress   Resp:  Normal effort  MSK:   Moves extremities without difficulty  Other:    Medical Decision Making  Medically screening exam initiated at 3:17 PM.  Appropriate orders placed.  Onica Davidovich was informed that the remainder of the evaluation will be completed by another provider, this initial triage assessment does not replace that evaluation, and the importance of remaining in the ED until their evaluation is complete.     Wilnette Kales, [place] 04/23/22 [phone]

## 2022-04-24 NOTE — Discharge Instructions (Addendum)
Please make sure you are taking your blood pressure medications.  You have any concerns, new or worsening symptoms, please return your nearest emergency department for evaluation.

## 2022-04-24 NOTE — ED Provider Notes (Signed)
  Physical Exam  BP (!) 143/80   Pulse 71   Temp 98.2 F (36.8 C) (Oral)   Resp 20   Ht '5\' 4"'$  (1.626 m)   Wt 98.4 kg   LMP  (LMP Unknown) Comment: vaginal bleeding continuously for last 8 months  SpO2 96%   BMI 37.25 kg/m   Physical Exam  Procedures  Procedures  ED Course / MDM    Medical Decision Making Risk Prescription drug management.   Accepted handoff at shift change from Alyse Low, [place]. Please see prior provider note for more detail.   Briefly: Patient is 61 y.o. F presenting with cough and cold symptoms.   DDX: concern for hypertensive urgency  Plan: BP meds have been given, follow up on BP recheck  Blood pressure has improved and is 143/80. EKG shows no significant change since last tracing. Troponins are slightly elevated, but flat.  It appears her creatinine is 2.04, baseline appears to be around 1.8, this is less than a 0.3 increase or 1.5 times her baseline.  Not consistent with any AKI.  I do see that her creatinine last month was 1.02.  This is an extreme outlier as her baseline is usually around 1.8.  Recommended follow up with PCP.        Sherrell Puller, PA-C 04/24/22 0111    Deno Etienne, DO 04/24/22 0117

## 2022-04-29 ENCOUNTER — Telehealth: Payer: Self-pay | Admitting: Licensed Clinical Social Worker

## 2022-04-29 NOTE — Patient Outreach (Signed)
  Care Coordination   04/29/2022 Name: Angela Gardner MRN: [id number] DOB: [birth year]   Care Coordination Outreach Attempts:  An unsuccessful telephone outreach was attempted today to offer the patient information about available care coordination services as a benefit of their health plan.   Follow Up Plan:  Additional outreach attempts will be made to offer the patient care coordination information and services.   Encounter Outcome:  No Answer  Care Coordination Interventions Activated:  No   Care Coordination Interventions:  No, not indicated    Christa See, MSW, [name] Phone [phone] 2:53 PM

## 2022-05-02 ENCOUNTER — Telehealth: Payer: Self-pay | Admitting: Family Medicine

## 2022-05-02 NOTE — Telephone Encounter (Signed)
Left message for patient to call back and schedule Medicare Annual Wellness Visit (AWV).   If not able to come in office, please offer to do virtually or by telephone.  Left my Herbie Drape [phone].  AWVI eligible as of 02/18/2021  Please schedule at anytime with Nurse Health Advisor.

## 2022-05-06 ENCOUNTER — Other Ambulatory Visit: Payer: Self-pay

## 2022-05-20 ENCOUNTER — Telehealth: Payer: Self-pay | Admitting: Family Medicine

## 2022-05-20 NOTE — Telephone Encounter (Signed)
Left message for patient to call back and schedule Medicare Annual Wellness Visit (AWV) .   If not able to come in office, please offer to do virtually or by telephone.  Left office number and my jabber [phone].  AWVI eligible as of 02/18/2021  Please schedule at anytime with Nurse Health Advisor.

## 2022-05-31 ENCOUNTER — Ambulatory Visit: Payer: Medicare Other | Attending: Family Medicine

## 2022-05-31 ENCOUNTER — Telehealth: Payer: Self-pay

## 2022-05-31 VITALS — Ht 64.0 in | Wt 218.0 lb

## 2022-05-31 DIAGNOSIS — Z0000 Encounter for general adult medical examination without abnormal findings: Secondary | ICD-10-CM

## 2022-05-31 NOTE — Progress Notes (Signed)
I connected with Angela Gardner today by telephone and verified that I am speaking with the correct person using two identifiers. Location patient: home Location provider: work Persons participating in the virtual visit: Angela Gardner, Glenna Durand LPN.   I discussed the limitations, risks, security and privacy concerns of performing an evaluation and management service by telephone and the availability of in person appointments. I also discussed with the patient that there may be a patient responsible charge related to this service. The patient expressed understanding and verbally consented to this telephonic visit.    Interactive audio and video telecommunications were attempted between this provider and patient, however failed, due to patient having technical difficulties OR patient did not have access to video capability.  We continued and completed visit with audio only.     Vital signs may be patient reported or missing.  Subjective:   Angela Gardner is a 61 y.o. female who presents for an Initial Medicare Annual Wellness Visit.  Review of Systems     Cardiac Risk Factors include: advanced age (>29mn, >>54women);dyslipidemia;hypertension;obesity (BMI >30kg/m2)     Objective:    Today's Vitals   05/31/22 1503  Weight: 218 lb (98.9 kg)  Height: '5\' 4"'$  (1.626 m)   Body mass index is 37.42 kg/m.     05/31/2022    3:08 PM 04/23/2022    2:55 PM 04/11/2022   10:40 AM 04/05/2021   11:14 AM 01/07/2021    5:59 PM 01/02/2021    5:19 PM 03/26/2020   10:36 AM  Advanced Directives  Does Patient Have a Medical Advance Directive? Yes No No Yes No No No  Type of Advance Directive Healthcare Power of Attorney        Does patient want to make changes to medical advance directive?    No - Patient declined     Copy of [place]? No - copy requested        Would patient like information on creating a medical advance directive?     No - Patient declined No -  Patient declined     Current Medications (verified) Outpatient Encounter Medications as of 05/31/2022  Medication Sig   amLODipine (NORVASC) 10 MG tablet Take 1 tablet (10 mg total) by mouth daily.   atorvastatin (LIPITOR) 40 MG tablet Take 1 tablet (40 mg total) by mouth daily.   buPROPion (WELLBUTRIN XL) 150 MG 24 hr tablet Take 1 tablet (150 mg total) by mouth daily for smoking cessation   diphenhydrAMINE (BENADRYL) 25 mg capsule Take 25 mg by mouth every 6 (six) hours as needed for itching.   FLUoxetine (PROZAC) 20 MG capsule Take 1 capsule (20 mg total) by mouth daily.   furosemide (LASIX) 40 MG tablet Take 1 tablet (40 mg total) by mouth 2 (two) times daily.   hydrALAZINE (APRESOLINE) 100 MG tablet Take 1 tablet (100 mg total) by mouth 3 (three) times daily.   pantoprazole (PROTONIX) 40 MG tablet Take 1 tablet (40 mg total) by mouth daily.   [DISCONTINUED] prochlorperazine (COMPAZINE) 10 MG tablet Take 1 tablet (10 mg total) by mouth every 6 (six) hours as needed (Nausea or vomiting). (Patient not taking: Reported on 05/25/2018)   No facility-administered encounter medications on file as of 05/31/2022.    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   Cancer ([place])    left ductal papilloma   Chest discomfort 09/12/2017   Negative stress test in May 2019   Chest pain 12/20/2019   CHF (congestive heart failure) ([facility])    Chronic kidney disease (CKD), stage III (moderate) 10/30/2017   Deficiency anemia 12/06/2017   Depression 40/98/1191   Diastolic congestive heart failure, NYHA class 3 ([place]) 10/27/2017   Diffuse pain 01/04/2018   Dysfunctional  uterine bleeding 09/12/2017   Dyspnea    still having this and not moving around alot -gets worse with exertion   Endometrial cancer ([place]) 10/19/2017   Essential hypertension 10/27/2017   History of radiation therapy    Endometrium- 01/16/18-02/21/18- Dr. 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   PMB (postmenopausal bleeding) 06/17/2015   Prediabetes 04/02/2018   Preventative health care 11/07/2018   Renal disorder    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   Vitamin D deficiency 02/15/2018   Past Surgical History:  Procedure Laterality Date   BREAST BIOPSY     left breast   CESAREAN SECTION     one   CHOLECYSTECTOMY     DILATION AND CURETTAGE OF UTERUS     approx 1 year ago   IR IMAGING GUIDED PORT INSERTION  12/06/2017   IR REMOVAL TUN ACCESS W/ PORT W/O FL MOD SED  06/19/2018   LYMPH NODE BIOPSY N/A 11/14/2017   Procedure: SENTINEL LYMPH NODE BIOPSY;  Surgeon: Everitt Amber, MD;  Location: [facility];  Service: Gynecology;  Laterality: N/A;   ROBOTIC ASSISTED TOTAL HYSTERECTOMY WITH BILATERAL SALPINGO OOPHERECTOMY N/A 11/14/2017   Procedure: XI ROBOTIC ASSISTED TOTAL HYSTERECTOMY WITH BILATERAL SALPINGO OOPHORECTOMY, RIGHT PELVIC LYMPHADENECTOMY;  Surgeon: Everitt Amber, MD;  Location: [facility];  Service: Gynecology;  Laterality: N/A;   Family History  Problem Relation Age of Onset   Anuerysm Mother    Congestive Heart  Failure Mother    Hypertension Father    Hypertension Sister    Hypertension Brother    Colon cancer Neg Hx    Social History   Socioeconomic History   Marital status: Single    Spouse name: Not on file   Number of children: 1   Years of education: Not on file   Highest education level: 9th grade  Occupational History   Not on file  Tobacco Use   Smoking status: Former    Packs/day: 0.30    Years: 26.00    Total pack years: 7.80    Types: Cigarettes   Smokeless tobacco: Never   Tobacco comments:    quit May 2019  Vaping Use   Vaping Use: Never used  Substance and Sexual Activity   Alcohol use: Not Currently  Comment: social drinking   Drug use: Not Currently    Types: Cocaine, "Crack" cocaine   Sexual activity: Not Currently    Birth control/protection: Post-menopausal  Other Topics Concern   Not on file  Social History Narrative   Not on file   Social Determinants of Health   Financial Resource Strain: Low Risk  (05/31/2022)   Overall Financial Resource Strain (CARDIA)    Difficulty of Paying Living Expenses: Not hard at all  Food Insecurity: No Food Insecurity (05/31/2022)   Hunger Vital Sign    Worried About Running Out of Food in the Last Year: Never true    Ran Out of Food in the Last Year: Never true  Transportation Needs: No Transportation Needs (05/31/2022)   PRAPARE - Hydrologist (Medical): No    Lack of Transportation (Non-Medical): No  Physical Activity: Inactive (05/31/2022)   Exercise Vital Sign    Days of Exercise per Week: 0 days    Minutes of Exercise per Session: 0 min  Stress: No Stress Concern Present (05/31/2022)   [place]    Feeling of Stress : Only a Angela  Social Connections: Not on file    Tobacco Counseling Counseling given: Not Answered Tobacco comments: quit May 2019   Clinical Intake:  Pre-visit preparation completed:  Yes  Pain : No/denies pain     Nutritional Status: BMI > 30  Obese Nutritional Risks: None Diabetes: No  How often do you need to have someone help you when you read instructions, pamphlets, or other written materials from your doctor or pharmacy?: 1 - Never  Diabetic? no  Interpreter Needed?: No  Information entered by :: NAllen LPN   Activities of Daily Living    05/31/2022    3:11 PM  In your present state of health, do you have any difficulty performing the following activities:  Hearing? 0  Vision? 0  Difficulty concentrating or making decisions? 0  Walking or climbing stairs? 1  Dressing or bathing? 0  Doing errands, shopping? 0  Preparing Food and eating ? N  Using the Toilet? N  In the past six months, have you accidently leaked urine? Y  Do you have problems with loss of bowel control? N  Managing your Medications? N  Managing your Finances? N  Housekeeping or managing your Housekeeping? N    Patient Care Team: Charlott Rakes, MD as PCP - General (Family Medicine) Park Liter, MD as PCP - Cardiology (Cardiology)  Indicate any recent Medical Services you may have received from other than [facility] providers in the past year (date may be approximate).     Assessment:   This is a routine wellness examination for [place].  Hearing/Vision screen Vision Screening - Comments:: No regular eye exams,   Dietary issues and exercise activities discussed: Current Exercise Habits: The patient does not participate in regular exercise at present   Goals Addressed             This Visit's Progress    Patient Stated       05/31/2022, looking for somewhere to stay        Depression Screen    05/31/2022    3:10 PM 03/07/2022    4:23 PM 07/02/2019    1:13 PM 07/03/2018   11:50 AM 04/09/2018   10:43 AM 04/02/2018   10:23 AM 11/27/2017   12:41 PM  PHQ 2/9 Scores  PHQ - 2 Score  0 6 0 '5 4 5 3  '$ PHQ- 9 Score  19 0 '20 10 23 18    '$ Fall Risk    05/31/2022     3:09 PM 03/07/2022    4:13 PM 01/07/2020   11:41 AM 07/02/2019   10:26 AM 10/08/2018    8:37 AM  Fall Risk   Falls in the past year? 1 1 0 0 0  Comment tripping      Number falls in past yr: 1 0     Injury with Fall? 0 1     Risk for fall due to : Medication side effect;Impaired mobility No Fall Risks No Fall Risks    Follow up Falls evaluation completed;Education provided;Falls prevention discussed        FALL RISK PREVENTION PERTAINING TO THE HOME:  Any stairs in or around the home? Yes  If so, are there any without handrails? No  Home free of loose throw rugs in walkways, pet beds, electrical cords, etc? No  Adequate lighting in your home to reduce risk of falls? No   ASSISTIVE DEVICES UTILIZED TO PREVENT FALLS:  Life alert? No  Use of a cane, walker or w/c? No  Grab bars in the bathroom? No  Shower chair or bench in shower? No  Elevated toilet seat or a handicapped toilet? No   TIMED UP AND GO:  Was the test performed? No .      Cognitive Function:        05/31/2022    3:13 PM  6CIT Screen  What Year? 0 points  What month? 0 points  What time? 3 points  Count back from 20 0 points  Months in reverse 0 points  Repeat phrase 4 points  Total Score 7 points    Immunizations Immunization History  Administered Date(s) Administered   Influenza,inj,Quad PF,6+ Mos 07/03/2018, 03/08/2022   Influenza-Unspecified 07/03/2018, 05/14/2019    TDAP status: Due, Education has been provided regarding the importance of this vaccine. Advised may receive this vaccine at local pharmacy or Health Dept. Aware to provide a copy of the vaccination record if obtained from local pharmacy or Health Dept. Verbalized acceptance and understanding.  Flu Vaccine status: Up to date  Pneumococcal vaccine status: Up to date  Covid-19 vaccine status: Declined, Education has been provided regarding the importance of this vaccine but patient still declined. Advised may receive this  vaccine at local pharmacy or Health Dept.or vaccine clinic. Aware to provide a copy of the vaccination record if obtained from local pharmacy or Health Dept. Verbalized acceptance and understanding.  Qualifies for Shingles Vaccine? Yes   Zostavax completed No   Shingrix Completed?: No.    Education has been provided regarding the importance of this vaccine. Patient has been advised to call insurance company to determine out of pocket expense if they have not yet received this vaccine. Advised may also receive vaccine at local pharmacy or Health Dept. Verbalized acceptance and understanding.  Screening Tests Health Maintenance  Topic Date Due   Medicare Annual Wellness (AWV)  Never done   COVID-19 Vaccine (1) Never done   DTaP/Tdap/Td (1 - Tdap) Never done   Zoster Vaccines- Shingrix (1 of 2) Never done   COLONOSCOPY (Pts 45-52yr Insurance coverage will need to be confirmed)  Never done   MAMMOGRAM  07/19/2020   INFLUENZA VACCINE  Completed   Hepatitis C Screening  Completed   HIV Screening  Completed   HPV VACCINES  Aged Out   PAP SMEAR-Modifier  Discontinued    Health Maintenance  Health Maintenance Due  Topic Date Due   Medicare Annual Wellness (AWV)  Never done   COVID-19 Vaccine (1) Never done   DTaP/Tdap/Td (1 - Tdap) Never done   Zoster Vaccines- Shingrix (1 of 2) Never done   COLONOSCOPY (Pts 45-52yr Insurance coverage will need to be confirmed)  Never done   MAMMOGRAM  07/19/2020    Colorectal cancer screening: due  Mammogram status: scheduled for 06/10/2022  Bone Density status: n/a  Lung Cancer Screening: (Low Dose CT Chest recommended if Age 774-80years, 30 pack-year currently smoking OR have quit w/in 15years.) does not qualify.   Lung Cancer Screening Referral: no  Additional Screening:  Hepatitis C Screening: does qualify; Completed 07/03/2018  Vision Screening: Recommended annual ophthalmology exams for early detection of glaucoma and other disorders of  the eye. Is the patient up to date with their annual eye exam?  No  Who is the provider or what is the name of the office in which the patient attends annual eye exams? none If pt is not established with a provider, would they like to be referred to a provider to establish care? No .   Dental Screening: Recommended annual dental exams for proper oral hygiene  Community Resource Referral / Chronic Care Management: CRR required this visit?  No   CCM required this visit?  No      Plan:     I have personally reviewed and noted the following in the patient's chart:   Medical and social history Use of alcohol, tobacco or illicit drugs  Current medications and supplements including opioid prescriptions. Patient is not currently taking opioid prescriptions. Functional ability and status Nutritional status Physical activity Advanced directives List of other physicians Hospitalizations, surgeries, and ER visits in previous 12 months Vitals Screenings to include cognitive, depression, and falls Referrals and appointments  In addition, I have reviewed and discussed with patient certain preventive protocols, quality metrics, and best practice recommendations. A written personalized care plan for preventive services as well as general preventive health recommendations were provided to patient.     NKellie Simmering LPN   [id number]/08/8331  Nurse Notes: none  Due to this being a virtual visit, the after visit summary with patients personalized plan was offered to patient via mail or my-chart. Patient would like to access on my-chart

## 2022-05-31 NOTE — Telephone Encounter (Signed)
This nurse attempted to call patient three times for telephonic AWV. Message left that we will call again to reschedule for another time.

## 2022-05-31 NOTE — Patient Instructions (Signed)
Angela Gardner , Thank you for taking time to come for your Medicare Wellness Visit. I appreciate your ongoing commitment to your health goals. Please review the following plan we discussed and let me know if I can assist you in the future.   These are the goals we discussed:  Goals      Patient Stated     05/31/2022, looking for somewhere to stay         This is a list of the screening recommended for you and due dates:  Health Maintenance  Topic Date Due   COVID-19 Vaccine (1) Never done   DTaP/Tdap/Td vaccine (1 - Tdap) Never done   Zoster (Shingles) Vaccine (1 of 2) Never done   Colon Cancer Screening  Never done   Mammogram  07/19/2020   Medicare Annual Wellness Visit  06/01/2023   Flu Shot  Completed   Hepatitis C Screening: USPSTF Recommendation to screen - Ages 18-79 yo.  Completed   HIV Screening  Completed   HPV Vaccine  Aged Out   Pap Smear  Discontinued    Advanced directives: Please bring a copy of your POA (Power of Attorney) and/or Living Will to your next appointment.   Conditions/risks identified: none  Next appointment: Follow up in one year for your annual wellness visit.   Preventive Care 40-64 Years, Female Preventive care refers to lifestyle choices and visits with your health care provider that can promote health and wellness. What does preventive care include? A yearly physical exam. This is also called an annual well check. Dental exams once or twice a year. Routine eye exams. Ask your health care provider how often you should have your eyes checked. Personal lifestyle choices, including: Daily care of your teeth and gums. Regular physical activity. Eating a healthy diet. Avoiding tobacco and drug use. Limiting alcohol use. Practicing safe sex. Taking low-dose aspirin daily starting at age 4. Taking vitamin and mineral supplements as recommended by your health care provider. What happens during an annual well check? The services and screenings  done by your health care provider during your annual well check will depend on your age, overall health, lifestyle risk factors, and family history of disease. Counseling  Your health care provider may ask you questions about your: Alcohol use. Tobacco use. Drug use. Emotional well-being. Home and relationship well-being. Sexual activity. Eating habits. Work and work Statistician. Method of birth control. Menstrual cycle. Pregnancy history. Screening  You may have the following tests or measurements: Height, weight, and BMI. Blood pressure. Lipid and cholesterol levels. These may be checked every 5 years, or more frequently if you are over 29 years old. Skin check. Lung cancer screening. You may have this screening every year starting at age 42 if you have a 30-pack-year history of smoking and currently smoke or have quit within the past 15 years. Fecal occult blood test (FOBT) of the stool. You may have this test every year starting at age 18. Flexible sigmoidoscopy or colonoscopy. You may have a sigmoidoscopy every 5 years or a colonoscopy every 10 years starting at age 68. Hepatitis C blood test. Hepatitis B blood test. Sexually transmitted disease (STD) testing. Diabetes screening. This is done by checking your blood sugar (glucose) after you have not eaten for a while (fasting). You may have this done every 1-3 years. Mammogram. This may be done every 1-2 years. Talk to your health care provider about when you should start having regular mammograms. This may depend on whether you  have a family history of breast cancer. BRCA-related cancer screening. This may be done if you have a family history of breast, ovarian, tubal, or peritoneal cancers. Pelvic exam and Pap test. This may be done every 3 years starting at age 48. Starting at age 12, this may be done every 5 years if you have a Pap test in combination with an HPV test. Bone density scan. This is done to screen for osteoporosis.  You may have this scan if you are at high risk for osteoporosis. Discuss your test results, treatment options, and if necessary, the need for more tests with your health care provider. Vaccines  Your health care provider may recommend certain vaccines, such as: Influenza vaccine. This is recommended every year. Tetanus, diphtheria, and acellular pertussis (Tdap, Td) vaccine. You may need a Td booster every 10 years. Zoster vaccine. You may need this after age 82. Pneumococcal 13-valent conjugate (PCV13) vaccine. You may need this if you have certain conditions and were not previously vaccinated. Pneumococcal polysaccharide (PPSV23) vaccine. You may need one or two doses if you smoke cigarettes or if you have certain conditions. Talk to your health care provider about which screenings and vaccines you need and how often you need them. This information is not intended to replace advice given to you by your health care provider. Make sure you discuss any questions you have with your health care provider. Document Released: 07/03/2015 Document Revised: 02/24/2016 Document Reviewed: 04/07/2015 Elsevier Interactive Patient Education  2017 [place] Prevention in the Home Falls can cause injuries. They can happen to people of all ages. There are many things you can do to make your home safe and to help prevent falls. What can I do on the outside of my home? Regularly fix the edges of walkways and driveways and fix any cracks. Remove anything that might make you trip as you walk through a door, such as a raised step or threshold. Trim any bushes or trees on the path to your home. Use bright outdoor lighting. Clear any walking paths of anything that might make someone trip, such as rocks or tools. Regularly check to see if handrails are loose or broken. Make sure that both sides of any steps have handrails. Any raised decks and porches should have guardrails on the edges. Have any  leaves, snow, or ice cleared regularly. Use sand or salt on walking paths during winter. Clean up any spills in your garage right away. This includes oil or grease spills. What can I do in the bathroom? Use night lights. Install grab bars by the toilet and in the tub and shower. Do not use towel bars as grab bars. Use non-skid mats or decals in the tub or shower. If you need to sit down in the shower, use a plastic, non-slip stool. Keep the floor dry. Clean up any water that spills on the floor as soon as it happens. Remove soap buildup in the tub or shower regularly. Attach bath mats securely with double-sided non-slip rug tape. Do not have throw rugs and other things on the floor that can make you trip. What can I do in the bedroom? Use night lights. Make sure that you have a light by your bed that is easy to reach. Do not use any sheets or blankets that are too big for your bed. They should not hang down onto the floor. Have a firm chair that has side arms. You can use this for support  while you get dressed. Do not have throw rugs and other things on the floor that can make you trip. What can I do in the kitchen? Clean up any spills right away. Avoid walking on wet floors. Keep items that you use a lot in easy-to-reach places. If you need to reach something above you, use a strong step stool that has a grab bar. Keep electrical cords out of the way. Do not use floor polish or wax that makes floors slippery. If you must use wax, use non-skid floor wax. Do not have throw rugs and other things on the floor that can make you trip. What can I do with my stairs? Do not leave any items on the stairs. Make sure that there are handrails on both sides of the stairs and use them. Fix handrails that are broken or loose. Make sure that handrails are as long as the stairways. Check any carpeting to make sure that it is firmly attached to the stairs. Fix any carpet that is loose or worn. Avoid  having throw rugs at the top or bottom of the stairs. If you do have throw rugs, attach them to the floor with carpet tape. Make sure that you have a light switch at the top of the stairs and the bottom of the stairs. If you do not have them, ask someone to add them for you. What else can I do to help prevent falls? Wear shoes that: Do not have high heels. Have rubber bottoms. Are comfortable and fit you well. Are closed at the toe. Do not wear sandals. If you use a stepladder: Make sure that it is fully opened. Do not climb a closed stepladder. Make sure that both sides of the stepladder are locked into place. Ask someone to hold it for you, if possible. Clearly mark and make sure that you can see: Any grab bars or handrails. First and last steps. Where the edge of each step is. Use tools that help you move around (mobility aids) if they are needed. These include: Canes. Walkers. Scooters. Crutches. Turn on the lights when you go into a dark area. Replace any light bulbs as soon as they burn out. Set up your furniture so you have a clear path. Avoid moving your furniture around. If any of your floors are uneven, fix them. If there are any pets around you, be aware of where they are. Review your medicines with your doctor. Some medicines can make you feel dizzy. This can increase your chance of falling. Ask your doctor what other things that you can do to help prevent falls. This information is not intended to replace advice given to you by your health care provider. Make sure you discuss any questions you have with your health care provider. Document Released: 04/02/2009 Document Revised: 11/12/2015 Document Reviewed: 07/11/2014 Elsevier Interactive Patient Education  2017 [employer].

## 2022-06-01 ENCOUNTER — Telehealth: Payer: Self-pay | Admitting: *Deleted

## 2022-06-01 ENCOUNTER — Other Ambulatory Visit: Payer: Self-pay | Admitting: Family Medicine

## 2022-06-01 MED ORDER — MISC. DEVICES MISC: 0 refills | Status: AC

## 2022-06-01 NOTE — Progress Notes (Unsigned)
  Care Coordination  Outreach Note  06/01/2022 Name: Angela Gardner MRN: [id number] DOB: 07/26/1960   Care Coordination Outreach Attempts: An unsuccessful telephone outreach was attempted today to offer the patient information about available care coordination services as a benefit of their health plan.   Follow Up Plan:  Additional outreach attempts will be made to offer the patient care coordination information and services.   Encounter Outcome:  No Answer  [place]  Direct Dial: [phone]

## 2022-06-02 ENCOUNTER — Telehealth: Payer: Self-pay | Admitting: Family Medicine

## 2022-06-02 NOTE — Telephone Encounter (Signed)
Anderson Malta with [facility] called in stating they received an order but need notes stating why the bedside commode is needed. An office note can be amended as long as its been within the last 6 months. Please fax to [phone]

## 2022-06-02 NOTE — Progress Notes (Signed)
  Care Coordination   Note   06/02/2022 Name: [name] MRN: [id number] DOB: 03-31-1961  Angela Gardner is a 61 y.o. year old female who sees Charlott Rakes, MD for primary care. I reached out to [name] by phone today to offer care coordination services.  [name] was given information about Care Coordination services today including:   The Care Coordination services include support from the care team which includes your Nurse Coordinator, Clinical Social Worker, or Pharmacist.  The Care Coordination team is here to help remove barriers to the health concerns and goals most important to you. Care Coordination services are voluntary, and the patient may decline or stop services at any time by request to their care team member.   Care Coordination Consent Status: Patient agreed to services and verbal consent obtained.   Follow up plan:  Telephone appointment with care coordination team member scheduled for:  06/03/22  Encounter Outcome:  Pt. Scheduled  [place]  Direct Dial: [phone]

## 2022-06-03 ENCOUNTER — Ambulatory Visit: Payer: Self-pay | Admitting: Licensed Clinical Social Worker

## 2022-06-03 NOTE — Patient Outreach (Signed)
  Care Coordination   Initial Visit Note   06/03/2022 Name: [name] MRN: [id number] DOB: [birth year]  Angela Gardner is a 61 y.o. year old female who sees Charlott Rakes, MD for primary care. I spoke with  [name] by phone today.  What matters to the patients health and wellness today?  Stress Management/Housing    Goals Addressed             This Visit's Progress    Obtaining Supportive Resources/Stress Management   On track    Care Coordination Interventions: Solution-Focused Strategies employed:  Mindfulness or Relaxation training provided Active listening / Reflection utilized  Emotional Support Provided Verbalization of feelings encouraged  Patient reports difficulty managing depression symptoms due to psychosocial stressors. Interested in obtaining independent housing Patient is unable to utilize rollater due to housing conditions, increasing fall risk. She is expecting a bedside commode and incontinence supplies  Patient participates in med management and reports effectiveness in decreasing symptoms Healthy coping skills discussed. Strategies to assist with navigating grief during holidays discussed Validation and encouragement provided LCSW will mail supportive resources, including [employer], [place] options. LCSW will collaborate with care coordination team for additional support services         SDOH assessments and interventions completed:  Yes  SDOH Interventions Today    Flowsheet Row Most Recent Value  SDOH Interventions   Housing Interventions Other (Comment)  [Senior housing, Housing Coalition]        Care Coordination Interventions:  Yes, provided   Follow up plan: Follow up call scheduled for 1-2 weeks    Encounter Outcome:  Pt. Visit Completed   Christa See, MSW, [email or web address] Phone [phone] 6:55 PM

## 2022-06-03 NOTE — Telephone Encounter (Signed)
Please schedule a virtual visit so this can be documented in any office note.  You can use the 8:10a or 1:30p slot.  Thank you

## 2022-06-03 NOTE — Patient Instructions (Signed)
Visit Information  Thank you for taking time to visit with me today. Please don't hesitate to contact me if I can be of assistance to you.   Following are the goals we discussed today:   Goals Addressed             This Visit's Progress    Obtaining Supportive Resources/Stress Management   On track    Care Coordination Interventions: Solution-Focused Strategies employed:  Mindfulness or Relaxation training provided Active listening / Reflection utilized  Emotional Support Provided Verbalization of feelings encouraged  Patient reports difficulty managing depression symptoms due to psychosocial stressors. Interested in obtaining independent housing Patient is unable to utilize rollater due to housing conditions, increasing fall risk. She is expecting a bedside commode and incontinence supplies  Patient participates in med management and reports effectiveness in decreasing symptoms Healthy coping skills discussed. Strategies to assist with navigating grief during holidays discussed Validation and encouragement provided LCSW will mail supportive resources, including [employer], [place] options. LCSW will collaborate with care coordination team for additional support services         Our next appointment is by telephone on 06/17/22 at 3:30 PM  Please call the care guide team at [phone] if you need to cancel or reschedule your appointment.   If you are experiencing a Mental Health or [place] or need someone to talk to, please call the Suicide and Crisis Lifeline: 988 call 911   Patient verbalizes understanding of instructions and care plan provided today and agrees to view in [place]. Active MyChart status and patient understanding of how to access instructions and care plan via MyChart confirmed with patient.     Christa See, MSW, [email or web address] Phone [phone] 6:56 PM

## 2022-06-07 NOTE — Telephone Encounter (Signed)
Call placed to patient to schedule Virtual appointment. Message left to call to schedule appointment.

## 2022-06-09 ENCOUNTER — Telehealth: Payer: Self-pay | Admitting: Family Medicine

## 2022-06-09 NOTE — Telephone Encounter (Signed)
Copied from [place] [phone]. Topic: General - Other >> Jun 09, 2022  2:40 PM Ludger Nutting wrote: Amo with [facility] called to get visit note supporting the need for a bedside commode. Please advise.

## 2022-06-10 ENCOUNTER — Ambulatory Visit: Payer: Medicare Other

## 2022-06-10 NOTE — Telephone Encounter (Signed)
Office Note has been faxed.

## 2022-06-17 ENCOUNTER — Ambulatory Visit: Payer: Self-pay | Admitting: Licensed Clinical Social Worker

## 2022-06-24 NOTE — Patient Instructions (Signed)
Visit Information  Thank you for taking time to visit with me today. Please don't hesitate to contact me if I can be of assistance to you.   Following are the goals we discussed today:   Goals Addressed             This Visit's Progress    Obtaining Supportive Resources/Stress Management   On track    Care Coordination Interventions: Solution-Focused Strategies employed:  Mindfulness or Relaxation training provided Active listening / Reflection utilized  Emotional Support Provided Verbalization of feelings encouraged  Pt visited family for holidays Pt rescheduled mammogram for 08/04/22 LCSW reviewed upcoming appts. Inquired about transportation. Pt will use sister's vehicle for upcoming appt LCSW will call Chasity with [facility] to follow up on bedside commode LCSW will continue to search for appropriate resources to assist with SDOH needs         Our next appointment is by telephone on 07/01/22 at 3 PM  Please call the care guide team at [phone] if you need to cancel or reschedule your appointment.   If you are experiencing a Mental Health or [place] or need someone to talk to, please call the Suicide and Crisis Lifeline: 988 call 911   Patient verbalizes understanding of instructions and care plan provided today and agrees to view in [place]. Active MyChart status and patient understanding of how to access instructions and care plan via MyChart confirmed with patient.     Christa See, MSW, [email or web address] Phone [phone] 1:37 PM

## 2022-06-24 NOTE — Patient Outreach (Addendum)
  Care Coordination Late Entry  Follow Up Visit Note   Outreach completed on 06/17/22 Name: Angela Gardner MRN: [id number] DOB: [birth year]  Angela Gardner is a 62 y.o. year old female who sees Charlott Rakes, MD for primary care. I spoke with  [name] by phone today.  What matters to the patients health and wellness today?  DME    Goals Addressed             This Visit's Progress    Obtaining Supportive Resources/Stress Management   On track    Care Coordination Interventions: Solution-Focused Strategies employed:  Mindfulness or Relaxation training provided Active listening / Reflection utilized  Emotional Support Provided Verbalization of feelings encouraged  Pt visited family for holidays Pt rescheduled mammogram for 08/04/22 LCSW reviewed upcoming appts. Inquired about transportation. Pt will use sister's vehicle for upcoming appt LCSW will call Chasity with [facility] to follow up on bedside commode LCSW will continue to search for appropriate resources to assist with SDOH needs         SDOH assessments and interventions completed:  No     Care Coordination Interventions:  Yes, provided   Follow up plan: Follow up call scheduled for 07/01/22    Encounter Outcome:  Pt. Visit Completed   Christa See, MSW, [email or web address] Phone [phone] 1:36 PM

## 2022-07-01 ENCOUNTER — Telehealth: Payer: Self-pay | Admitting: Licensed Clinical Social Worker

## 2022-07-01 ENCOUNTER — Encounter: Payer: Self-pay | Admitting: Licensed Clinical Social Worker

## 2022-07-01 NOTE — Patient Outreach (Signed)
  Care Coordination   07/01/2022 Name: Kelbi Renstrom MRN: [id number] DOB: 1961-03-11   Care Coordination Outreach Attempts:  An unsuccessful telephone outreach was attempted for a scheduled appointment today.  Follow Up Plan:  Additional outreach attempts will be made to offer the patient care coordination information and services.   Encounter Outcome:  No Answer   Care Coordination Interventions:  No, not indicated    Christa See, MSW, [email or web address] Phone [phone] 4:26 PM

## 2022-07-11 ENCOUNTER — Ambulatory Visit: Payer: 59 | Attending: Family Medicine | Admitting: Family Medicine

## 2022-07-11 ENCOUNTER — Encounter: Payer: Self-pay | Admitting: Family Medicine

## 2022-07-11 ENCOUNTER — Ambulatory Visit: Payer: Medicare Other | Admitting: Family Medicine

## 2022-07-11 DIAGNOSIS — U071 COVID-19: Secondary | ICD-10-CM | POA: Diagnosis not present

## 2022-07-11 MED ORDER — MOLNUPIRAVIR EUA 200MG CAPSULE: 4.0000 | ORAL_CAPSULE | Freq: Two times a day (BID) | ORAL | 0 refills | Status: AC

## 2022-07-11 MED ORDER — PREDNISONE 20 MG PO TABS: 20.0000 mg | ORAL_TABLET | Freq: Every day | ORAL | 0 refills | Status: DC

## 2022-07-11 NOTE — Progress Notes (Signed)
Virtual Visit via Video Note  I connected with Angela Gardner, on 07/11/2022 at 1:36 PM by video enabled telemedicine device and verified that I am speaking with the correct person using two identifiers.   Consent: I discussed the limitations, risks, security and privacy concerns of performing an evaluation and management service by telemedicine and the availability of in person appointments. I also discussed with the patient that there may be a patient responsible charge related to this service. The patient expressed understanding and agreed to proceed.   Location of Patient: Home  Location of Provider: Clinic   Persons participating in Telemedicine visit: Angela Gardner Dr. Margarita Rana     History of Present Illness: Angela Gardner is a 62 y.o. year old female  with  a history of hypertension, stage III chronic kidney disease, diastolic CHF (EF 60 to 50% from 07/2018), Endometrial carcinoma diagnosed in 09/2017 s/p total abdominal hysterectomy and bilateral salpingo-oophorectomy on 11/14/2017, status post radiation and chemotherapy.     Her sister tested positive for COVID and she has been around her. She has had a cough, rhinorrhea, drowsiness, sore throat but she is starting to feel better.  Denies presence of fever, myalgias, dyspnea, wheezing. She received the Pfizer vaccine last year.  She just did a home test which was negative. Currently not taking any OTC medications but has been drinking orange juice.  Past Medical History:  Diagnosis Date   Acute bronchitis 12/04/2008   Qualifier: Diagnosis of  By: Tomma Lightning MD, Claiborne Billings     Acute CHF (congestive heart failure) ([place]) 09/12/2017   Acute exacerbation of CHF (congestive heart failure) ([place]) 09/13/2017   Anemia    Antineoplastic chemotherapy induced anemia 04/05/2018   Cancer ([place])    left ductal papilloma   Chest discomfort 09/12/2017   Negative stress test in May 2019   Chest pain 12/20/2019   CHF (congestive heart  failure) (HCC)    Chronic kidney disease (CKD), stage III (moderate) 10/30/2017   Deficiency anemia 12/06/2017   Depression 09/38/1829   Diastolic congestive heart failure, NYHA class 3 ([place]) 10/27/2017   Diffuse pain 01/04/2018   Dysfunctional uterine bleeding 09/12/2017   Dyspnea    still having this and not moving around alot -gets worse with exertion   Endometrial cancer ([place]) 10/19/2017   Essential hypertension 10/27/2017   History of radiation therapy    Endometrium- 01/16/18-02/21/18- Dr. 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 30 tablet 3   atorvastatin (LIPITOR) 40 MG tablet Take 1 tablet (40 mg total) by mouth daily. 30 tablet 3   buPROPion (WELLBUTRIN XL) 150 MG 24 hr tablet Take 1 tablet (150 mg total) by mouth daily for smoking cessation 30 tablet 3   diphenhydrAMINE (BENADRYL) 25 mg capsule Take 25 mg by mouth every 6 (six) hours as needed for itching.      FLUoxetine (PROZAC) 20 MG capsule Take 1 capsule (20 mg total) by mouth daily. 30 capsule 3   furosemide (LASIX) 40 MG tablet Take 1 tablet (40 mg total) by mouth 2 (two) times daily. 60 tablet 3   hydrALAZINE (APRESOLINE) 100 MG tablet Take 1 tablet (100 mg total) by mouth 3 (three) times daily. 270 tablet 1   Misc. Devices MISC Bedside commode.  Diagnosis urinary incontinence 1 each 0   pantoprazole (PROTONIX) 40 MG tablet Take 1 tablet (40 mg total) by mouth daily. 90 tablet 1   [DISCONTINUED] prochlorperazine (COMPAZINE) 10 MG tablet Take 1 tablet (10 mg total) by mouth every 6 (six) hours as needed (Nausea or vomiting). (Patient not taking: Reported on 05/25/2018) 30 tablet 1   No current facility-administered medications on file prior to visit.    ROS: See HPI  Observations/Objective: General:  AAOx3 Respiratory: Not in acute distress, normal respirations Cardiac: Normal Extremities: FROM Psych: Normal mood     Latest Ref Rng & Units 04/23/2022    7:25 PM 03/07/2022    4:41 PM 10/13/2021   10:21 AM  CMP  Glucose 70 - 99 mg/dL 101  94  104   BUN 6 - 20 mg/dL '26  12  28   '$ Creatinine 0.44 - 1.00 mg/dL 2.04  1.02  1.42   Sodium 135 - 145 mmol/L 141  142  142   Potassium 3.5 - 5.1 mmol/L 3.9  4.9  3.6   Chloride 98 - 111 mmol/L 109  104  112   CO2 22 - 32 mmol/L '26  16  23   '$ Calcium 8.9 - 10.3 mg/dL 8.7  9.7  9.0   Total Protein 6.5 - 8.1 g/dL 6.8  8.0  7.0   Total Bilirubin 0.3 - 1.2 mg/dL 0.3  0.3  0.6   Alkaline Phos 38 - 126 U/L 100  66  109   AST 15 - 41 U/L '14  21  14   '$ ALT 0 - 44 U/L '12  23  12     '$ Lipid Panel     Component Value Date/Time   CHOL 248 (H) 07/02/2019 1135   TRIG 88 01/07/2021 2055   HDL 78 07/02/2019 1135   CHOLHDL 3.2 07/02/2019 1135   CHOLHDL 3.4 Ratio 03/30/2009 2039   VLDL 28 03/30/2009 2039   LDLCALC 155 (H) 07/02/2019 1135   LABVLDL 15 07/02/2019 1135    Lab Results  Component Value Date   HGBA1C 6.0 07/03/2018     Assessment and  Plan: 1. COVID-19 virus infection Based on her symptoms and her exposure to her sister she most likely has COVID-19 infection even though home test is negative a PCR test may be positive Will treat presumptively Due to stage IV CKD I am unable to place her on Paxlovid Will place on molnupiravir Advised to stay isolated for 5 days indoors and for the next 5 days she will need to be masked when out and about - predniSONE (DELTASONE) 20 MG tablet; Take 1 tablet (20 mg  total) by mouth daily with breakfast.  Dispense: 5 tablet; Refill: 0 - molnupiravir EUA (LAGEVRIO) 200 mg CAPS capsule; Take 4 capsules (800 mg total) by mouth 2 (two) times daily for 5 days.  Dispense: 40 capsule; Refill: 0   Follow Up Instructions: She is due for follow-up for chronic disease management Advised to call the front office to schedule   I discussed the assessment and treatment plan with the patient. The patient was provided an opportunity to ask questions and all were answered. The patient agreed with the plan and demonstrated an understanding of the instructions.   The patient was advised to call back or seek an in-person evaluation if the symptoms worsen or if the condition fails to improve as anticipated.     I provided 16 minutes total of Telehealth time during this encounter including median intraservice time, reviewing previous notes, investigations, ordering medications, medical decision making, coordinating care and patient verbalized understanding at the end of the visit.     Charlott Rakes, MD, FAAFP. [facility] and [place], [place]   07/11/2022, 1:36 PM

## 2022-07-11 NOTE — Patient Instructions (Signed)

## 2022-08-04 ENCOUNTER — Ambulatory Visit: Payer: Medicare Other

## 2022-08-08 ENCOUNTER — Other Ambulatory Visit: Payer: Self-pay

## 2022-08-10 ENCOUNTER — Encounter: Payer: Self-pay | Admitting: Family Medicine

## 2022-08-10 ENCOUNTER — Other Ambulatory Visit: Payer: Self-pay

## 2022-08-10 ENCOUNTER — Telehealth (HOSPITAL_BASED_OUTPATIENT_CLINIC_OR_DEPARTMENT_OTHER): Payer: 59 | Admitting: Family Medicine

## 2022-08-10 DIAGNOSIS — I130 Hypertensive heart and chronic kidney disease with heart failure and stage 1 through stage 4 chronic kidney disease, or unspecified chronic kidney disease: Secondary | ICD-10-CM

## 2022-08-10 DIAGNOSIS — I10 Essential (primary) hypertension: Secondary | ICD-10-CM

## 2022-08-10 DIAGNOSIS — I5032 Chronic diastolic (congestive) heart failure: Secondary | ICD-10-CM

## 2022-08-10 DIAGNOSIS — N1831 Chronic kidney disease, stage 3a: Secondary | ICD-10-CM

## 2022-08-10 DIAGNOSIS — F331 Major depressive disorder, recurrent, moderate: Secondary | ICD-10-CM | POA: Diagnosis not present

## 2022-08-10 NOTE — Progress Notes (Signed)
Virtual Visit via Telephone Note  I connected with Angela Gardner, on 08/10/2022 at 2:32 PM by telephone and verified that I am speaking with the correct person using two identifiers.   Consent: I discussed the limitations, risks, security and privacy concerns of performing an evaluation and management service by telephone and the availability of in person appointments. I also discussed with the patient that there may be a patient responsible charge related to this service. The patient expressed understanding and agreed to proceed.   Location of Patient: Home  Location of Provider: Clinic   Persons participating in Telemedicine visit: Angela Gardner Dr. Margarita Rana     History of Present Illness: Angela Gardner is a 62 y.o. year old female with a history of hypertension, stage III chronic kidney disease, diastolic CHF (EF 60 to 123456 from 07/2018), Endometrial carcinoma diagnosed in 09/2017 s/p total abdominal hysterectomy and bilateral salpingo-oophorectomy on 11/14/2017, status post radiation and chemotherapy.      She was supposed to come in for an office visit but somehow a video visit Schedule but she was unable to log on to join the video visit despite my providing instructions to walk her through hence it was converted to an audio only visit.  She is feeling a lot better after she was treated for COVID-19 1 month ago. She endorses adherence with her antihypertensive and her statin.  Her depression is also controlled on her current regimen. She is due for labs and her last blood work revealed an elevated creatinine of 2.0 up from a previous of 1.04.  He seems at baseline since around 1.8.  She also had elevated total cholesterol from 2021. Her depression is controlled on her current regimen. She denies presence of additional concerns today. Past Medical History:  Diagnosis Date   Acute bronchitis 12/04/2008   Qualifier: Diagnosis of  By: Tomma Lightning MD, Claiborne Billings     Acute CHF  (congestive heart failure) ([place]) 09/12/2017   Acute exacerbation of CHF (congestive heart failure) ([place]) 09/13/2017   Anemia    Antineoplastic chemotherapy induced anemia 04/05/2018   Cancer ([place])    left ductal papilloma   Chest discomfort 09/12/2017   Negative stress test in May 2019   Chest pain 12/20/2019   CHF (congestive heart failure) (HCC)    Chronic kidney disease (CKD), stage III (moderate) 10/30/2017   Deficiency anemia 12/06/2017   Depression [id number]   Diastolic congestive heart failure, NYHA class 3 ([place]) 10/27/2017   Diffuse pain 01/04/2018   Dysfunctional uterine bleeding 09/12/2017   Dyspnea    still having this and not moving around alot -gets worse with exertion   Endometrial cancer ([place]) 10/19/2017   Essential hypertension 10/27/2017   History of radiation therapy    Endometrium- 01/16/18-02/21/18- Dr. 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 30  tablet 3   atorvastatin (LIPITOR) 40 MG tablet Take 1 tablet (40 mg total) by mouth daily. 30 tablet 3   buPROPion (WELLBUTRIN XL) 150 MG 24 hr tablet Take 1 tablet (150 mg total) by mouth daily for smoking cessation 30 tablet 3   diphenhydrAMINE (BENADRYL) 25 mg capsule Take 25 mg by mouth every 6 (six) hours as needed for itching.     FLUoxetine (PROZAC) 20 MG capsule Take 1 capsule (20 mg total) by mouth daily. 30 capsule 3   furosemide (LASIX) 40 MG tablet Take 1 tablet (40 mg total) by mouth 2 (two) times daily. 60 tablet 3   hydrALAZINE (APRESOLINE) 100 MG tablet Take 1 tablet (100 mg total) by mouth 3 (three) times daily. 270 tablet 1   Misc. Devices MISC Bedside commode.  Diagnosis urinary incontinence 1 each 0   pantoprazole (PROTONIX) 40 MG tablet Take 1 tablet (40 mg total) by mouth daily. 90 tablet 1   predniSONE (DELTASONE) 20 MG tablet Take 1 tablet (20 mg total) by mouth daily with breakfast. 5 tablet 0   [DISCONTINUED] prochlorperazine (COMPAZINE) 10 MG tablet Take 1 tablet (10 mg total) by mouth every 6 (six) hours as needed (Nausea or vomiting). (Patient not taking: Reported on 05/25/2018) 30 tablet 1   No current facility-administered medications on file prior to visit.    ROS: See HPI  Observations/Objective: Awake, alert, oriented x3 Not in acute distress Normal mood      Latest Ref Rng & Units 04/23/2022    7:25 PM 03/07/2022    4:41 PM 10/13/2021   10:21 AM  CMP  Glucose 70 - 99 mg/dL 101  94  104   BUN 6 - 20 mg/dL 26  12  28   $ Creatinine 0.44 - 1.00 mg/dL 2.04  1.02  1.42   Sodium 135 - 145 mmol/L 141  142  142   Potassium 3.5 - 5.1 mmol/L 3.9  4.9  3.6   Chloride 98 - 111 mmol/L 109  104  112   CO2 22 - 32 mmol/L 26  16  23   $ Calcium 8.9 - 10.3 mg/dL 8.7  9.7  9.0   Total Protein 6.5 - 8.1 g/dL 6.8  8.0  7.0   Total Bilirubin 0.3 - 1.2 mg/dL 0.3  0.3  0.6   Alkaline Phos 38 - 126 U/L 100  66  109   AST 15 - 41 U/L 14  21  14   $ ALT 0 - 44 U/L 12  23  12      $ Lipid Panel     Component Value Date/Time   CHOL 248 (H) 07/02/2019 1135   TRIG 88 01/07/2021 2055   HDL 78 07/02/2019 1135   CHOLHDL 3.2 07/02/2019 1135   CHOLHDL 3.4 Ratio 03/30/2009 2039   VLDL 28 03/30/2009 2039   LDLCALC 155 (H) 07/02/2019 1135   LABVLDL 15 07/02/2019 1135    Lab Results  Component Value Date   HGBA1C 6.0 07/03/2018    Assessment and Plan: 1. Chronic diastolic congestive heart failure, NYHA class 3 (  [place]) Euvolemic with EF of 60% from echo 12/2019 Continue Lasix, hydralazine - LP+Non-HDL Cholesterol; Future  2. Essential hypertension She remains adherent with amlodipine Advised that she needs an in person office visit Counseled on blood pressure goal of less than 130/80, low-sodium, DASH diet, medication compliance, 150 minutes of moderate intensity exercise per week. Discussed medication compliance, adverse effects.  3. Moderate episode of recurrent major depressive disorder (HCC) Controlled Continue Prozac  4. Stage 3a chronic kidney disease (HCC) Last creatinine was 2.04 She needs repeat labs and has been scheduled to come into the office next week for labs Avoid nephrotoxins - CMP14+EGFR; Future   Follow Up Instructions: Follow-up in 3 months   I discussed the assessment and treatment plan with the patient. The patient was provided an opportunity to ask questions and all were answered. The patient agreed with the plan and demonstrated an understanding of the instructions.   The patient was advised to call back or seek an in-person evaluation if the symptoms worsen or if the condition fails to improve as anticipated.     I provided 15 minutes total of non-face-to-face time during this encounter.   Charlott Rakes, MD, FAAFP. [facility] and [place], [place]   08/10/2022, 2:32 PM

## 2022-08-17 ENCOUNTER — Ambulatory Visit: Payer: 59 | Attending: Family Medicine

## 2022-08-17 ENCOUNTER — Ambulatory Visit
Admission: RE | Admit: 2022-08-17 | Discharge: 2022-08-17 | Disposition: A | Discharge status: Home / Self Care | Payer: 59 | Source: Ambulatory Visit | Attending: Family Medicine | Admitting: Family Medicine

## 2022-08-17 DIAGNOSIS — I5032 Chronic diastolic (congestive) heart failure: Secondary | ICD-10-CM

## 2022-08-17 DIAGNOSIS — Z1231 Encounter for screening mammogram for malignant neoplasm of breast: Secondary | ICD-10-CM

## 2022-08-17 DIAGNOSIS — N1831 Chronic kidney disease, stage 3a: Secondary | ICD-10-CM

## 2022-08-18 LAB — CMP14+EGFR
ALT: 11 IU/L (ref 0–32)
AST: 13 IU/L (ref 0–40)
Albumin/Globulin Ratio: 1.6 (ref 1.2–2.2)
Albumin: 4.2 g/dL (ref 3.8–4.9)
Alkaline Phosphatase: 124 IU/L — ABNORMAL HIGH (ref 44–121)
BUN/Creatinine Ratio: 11 — ABNORMAL LOW (ref 12–28)
BUN: 22 mg/dL (ref 8–27)
Bilirubin Total: 0.3 mg/dL (ref 0.0–1.2)
CO2: 20 mmol/L (ref 20–29)
Calcium: 9.3 mg/dL (ref 8.7–10.3)
Chloride: 102 mmol/L (ref 96–106)
Creatinine, Ser: 1.95 mg/dL — ABNORMAL HIGH (ref 0.57–1.00)
Globulin, Total: 2.6 g/dL (ref 1.5–4.5)
Glucose: 98 mg/dL (ref 70–99)
Potassium: 3.8 mmol/L (ref 3.5–5.2)
Sodium: 140 mmol/L (ref 134–144)
Total Protein: 6.8 g/dL (ref 6.0–8.5)
eGFR: 29 mL/min/{1.73_m2} — ABNORMAL LOW (ref >59)

## 2022-08-18 LAB — LP+NON-HDL CHOLESTEROL
Cholesterol, Total: 214 mg/dL — ABNORMAL HIGH (ref 100–199)
HDL: 91 mg/dL (ref >39)
LDL Chol Calc (NIH): 113 mg/dL — ABNORMAL HIGH (ref 0–99)
Total Non-HDL-Chol (LDL+VLDL): 123 mg/dL (ref 0–129)
Triglycerides: 59 mg/dL (ref 0–149)
VLDL Cholesterol Cal: 10 mg/dL (ref 5–40)

## 2022-08-19 ENCOUNTER — Telehealth: Payer: Self-pay

## 2022-08-19 ENCOUNTER — Telehealth: Payer: Self-pay | Admitting: *Deleted

## 2022-08-19 ENCOUNTER — Other Ambulatory Visit: Payer: Self-pay

## 2022-08-19 ENCOUNTER — Other Ambulatory Visit: Payer: Self-pay | Admitting: Family Medicine

## 2022-08-19 DIAGNOSIS — I5032 Chronic diastolic (congestive) heart failure: Secondary | ICD-10-CM

## 2022-08-19 DIAGNOSIS — I130 Hypertensive heart and chronic kidney disease with heart failure and stage 1 through stage 4 chronic kidney disease, or unspecified chronic kidney disease: Secondary | ICD-10-CM

## 2022-08-19 MED ORDER — ATORVASTATIN CALCIUM 80 MG PO TABS
80.0000 mg | ORAL_TABLET | Freq: Every day | ORAL | 1 refills | Status: DC
  Filled 2022-08-19: qty 90, 90d supply, fill #0
  Filled 2022-11-10: qty 90, 90d supply, fill #1
  Filled 2022-11-10: qty 90, 90d supply, fill #0

## 2022-08-19 NOTE — Telephone Encounter (Signed)
Called patient to inform of fu appt. with Dr. Lahoma Crocker on 10-19-22 arrival time - 11 am, lvm for a return call

## 2022-08-19 NOTE — Telephone Encounter (Signed)
Angela Gardner from ([facility]) called office.  Pt is scheduled for a follow up on 10/19/22  at 11:15 with [place].  Angela Gardner to notify pt of appointment date and time.

## 2022-08-23 ENCOUNTER — Other Ambulatory Visit: Payer: Self-pay

## 2022-09-07 ENCOUNTER — Telehealth: Payer: Self-pay | Admitting: Emergency Medicine

## 2022-09-07 ENCOUNTER — Telehealth: Payer: Self-pay | Admitting: Licensed Clinical Social Worker

## 2022-09-07 NOTE — Telephone Encounter (Signed)
Copied from [place] [phone]. Topic: General - Inquiry >> Sep 07, 2022  1:03 PM Marcellus Scott wrote: Reason for CRM: Pt called in requesting to speak with Christa See, declined to provide further details, and requested a callback.   Please advise.

## 2022-09-07 NOTE — Patient Outreach (Signed)
  Care Coordination   Follow Up Visit Note   09/07/2022 Name: Angela Gardner MRN: [id number] DOB: 08-23-1960  Angela Gardner is a 62 y.o. year old female who sees Charlott Rakes, MD for primary care. I spoke with  Angela Gardner by phone today.  What matters to the patients health and wellness today?  Symptom Management    Goals Addressed             This Visit's Progress    Obtaining Supportive Resources/Stress Management       Activities and task to complete in order to accomplish goals.   Keep all upcoming appointments discussed today Continue with compliance of taking medication prescribed by Doctor Implement healthy coping skills discussed to assist with management of symptoms Continue working with [facility] care team to assist with goals identified          SDOH assessments and interventions completed:  No     Care Coordination Interventions:  Yes, provided  Interventions Today    Flowsheet Row Most Recent Value  Chronic Disease   Chronic disease during today's visit Hypertension (HTN), Congestive Heart Failure (CHF), Chronic Kidney Disease/End Stage Renal Disease (ESRD)  General Interventions   General Interventions Discussed/Reviewed General Interventions Reviewed  Mental Health Interventions   Mental Health Discussed/Reviewed Mental Health Reviewed       Follow up plan: Follow up call scheduled for 3/29    Encounter Outcome:  Pt. Visit Completed   Christa See, MSW, [email or web address] Phone [phone] 4:27 PM

## 2022-09-07 NOTE — Patient Instructions (Signed)
Visit Information  Thank you for taking time to visit with me today. Please don't hesitate to contact me if I can be of assistance to you.   Following are the goals we discussed today:   Goals Addressed             This Visit's Progress    Obtaining Supportive Resources/Stress Management       Activities and task to complete in order to accomplish goals.   Keep all upcoming appointments discussed today Continue with compliance of taking medication prescribed by Doctor Implement healthy coping skills discussed to assist with management of symptoms Continue working with [facility] care team to assist with goals identified          Our next appointment is by telephone on 09/16/22 at 9 AM  Please call the care guide team at [phone] if you need to cancel or reschedule your appointment.   If you are experiencing a Mental Health or [place] or need someone to talk to, please call the Suicide and Crisis Lifeline: 988 call 911   Patient verbalizes understanding of instructions and care plan provided today and agrees to view in [place]. Active MyChart status and patient understanding of how to access instructions and care plan via MyChart confirmed with patient.     Christa See, MSW, [email or web address] Phone [phone] 4:28 PM

## 2022-09-15 ENCOUNTER — Telehealth: Payer: Self-pay | Admitting: Licensed Clinical Social Worker

## 2022-09-15 NOTE — Patient Instructions (Signed)
Visit Information  Thank you for taking time to visit with me today. Please don't hesitate to contact me if I can be of assistance to you.    Our next appointment is by telephone on 4/5   Please call the care guide team at [phone] if you need to cancel or reschedule your appointment.   If you are experiencing a Mental Health or [place] or need someone to talk to, please call the Suicide and Crisis Lifeline: 988 call 911   Patient verbalizes understanding of instructions and care plan provided today and agrees to view in [place]. Active MyChart status and patient understanding of how to access instructions and care plan via MyChart confirmed with patient.     Christa See, MSW, [email or web address] Phone [phone] 4:34 PM

## 2022-09-15 NOTE — Patient Outreach (Signed)
  Care Coordination   Follow Up Visit Note   09/15/2022 Name: Ryn Ogrady MRN: [id number] DOB: 07-03-1960  Abrianna Bearman is a 62 y.o. year old female who sees Charlott Rakes, MD for primary care. I spoke with  [name] by phone today.  What matters to the patients health and wellness today?  Rescheduling appt     SDOH assessments and interventions completed:  No     Care Coordination Interventions:  Yes, provided   Follow up plan: Follow up call scheduled for 4/5    Encounter Outcome:  Pt. Visit Completed   Christa See, MSW, [email or web address] Phone [phone] 4:32 PM

## 2022-09-16 ENCOUNTER — Encounter: Payer: Self-pay | Admitting: Licensed Clinical Social Worker

## 2022-09-23 ENCOUNTER — Ambulatory Visit: Payer: Self-pay | Admitting: Licensed Clinical Social Worker

## 2022-09-26 NOTE — Patient Outreach (Signed)
  Care Coordination   Follow Up Visit Note   09/23/22 Name: Angela Gardner MRN: [id number] DOB: 07-30-1960  Angela Gardner is a 62 y.o. year old female who sees [name], MD for primary care. I spoke with  [name] by phone today.  What matters to the patients health and wellness today?  Housing/Symptom Management    Goals Addressed             This Visit's Progress    Obtaining Supportive Resources/Stress Management/Housin   On track    Activities and task to complete in order to accomplish goals.   Keep all upcoming appointments discussed today Continue with compliance of taking medication prescribed by Doctor Implement healthy coping skills discussed to assist with management of symptoms Continue working with [facility] care team to assist with goals identified You and/or family inquire about flooring consult          SDOH assessments and interventions completed:  Yes  SDOH Interventions Today    Flowsheet Row Most Recent Value  SDOH Interventions   Food Insecurity Interventions Intervention Not Indicated  Transportation Interventions Intervention Not Indicated        Care Coordination Interventions:  Yes, provided  Interventions Today    Flowsheet Row Most Recent Value  Chronic Disease   Chronic disease during today's visit Hypertension (HTN), Congestive Heart Failure (CHF), Chronic Kidney Disease/End Stage Renal Disease (ESRD), Other  [Depression]  General Interventions   General Interventions Discussed/Reviewed General Interventions Reviewed, Community Resources  Mental Health Interventions   Mental Health Discussed/Reviewed Mental Health Reviewed, Coping Strategies, Depression, Anxiety       Follow up plan: Follow up call scheduled for 1 week    Encounter Outcome:  Pt. Visit Completed   [name], MSW, LCSW [facility] Care Management [facility]  [name] Phone [phone] 9:26 AM'

## 2022-09-26 NOTE — Patient Instructions (Signed)
Visit Information  Thank you for taking time to visit with me today. Please don't hesitate to contact me if I can be of assistance to you.   Following are the goals we discussed today:   Goals Addressed             This Visit's Progress    Obtaining Supportive Resources/Stress Management/Housin   On track    Activities and task to complete in order to accomplish goals.   Keep all upcoming appointments discussed today Continue with compliance of taking medication prescribed by Doctor Implement healthy coping skills discussed to assist with management of symptoms Continue working with [facility] care team to assist with goals identified You and/or family inquire about flooring consult          Our next appointment is by telephone on 09/30/22 at 1 PM  Please call the care guide team at [phone] if you need to cancel or reschedule your appointment.   If you are experiencing a Mental Health or Behavioral Health Crisis or need someone to talk to, please call the Suicide and Crisis Lifeline: 988 call 911   Patient verbalizes understanding of instructions and care plan provided today and agrees to view in MyChart. Active MyChart status and patient understanding of how to access instructions and care plan via MyChart confirmed with patient.     [name], MSW, LCSW [facility] Care Management [place]  [name] Phone [phone] 9:27 AM

## 2022-09-30 ENCOUNTER — Ambulatory Visit: Payer: Self-pay | Admitting: Licensed Clinical Social Worker

## 2022-10-03 ENCOUNTER — Other Ambulatory Visit: Payer: Self-pay | Admitting: Family Medicine

## 2022-10-03 NOTE — Patient Outreach (Signed)
  Care Coordination   Follow Up Visit Note   09/30/2022 Name: Angela Gardner MRN: [id number] DOB: 12/10/1960  Phi Cerezo is a 62 y.o. year old female who sees [name], MD for primary care. I spoke with  Angela Gardner by phone today.  What matters to the patients health and wellness today?  Housing. Pt requested to r/s f/up appt    Goals Addressed             This Visit's Progress    Obtaining Supportive Resources/Stress Management/Housin   On track    Activities and task to complete in order to accomplish goals.   Keep all upcoming appointments discussed today Continue with compliance of taking medication prescribed by Doctor Implement healthy coping skills discussed to assist with management of symptoms Continue working with [facility] care team to assist with goals identified You and/or family inquire about flooring consult          SDOH assessments and interventions completed:  No     Care Coordination Interventions:  Yes, provided  Interventions Today    Flowsheet Row Most Recent Value  Chronic Disease   Chronic disease during today's visit Hypertension (HTN), Congestive Heart Failure (CHF), Chronic Kidney Disease/End Stage Renal Disease (ESRD)  General Interventions   General Interventions Discussed/Reviewed General Interventions Reviewed, Community Resources  [LCSW continues to review community resources to assist pt with obtaining housing goals]       Follow up plan: Follow up call scheduled for 1-2 weeks    Encounter Outcome:  Pt. Request to Call Back   [name], MSW, LCSW [facility] Care Management [facility]  [name] [email or web address] Phone [phone] 5:07 PM

## 2022-10-03 NOTE — Patient Instructions (Signed)
Visit Information  Thank you for taking time to visit with me today. Please don't hesitate to contact me if I can be of assistance to you.   Following are the goals we discussed today:   Goals Addressed             This Visit's Progress    Obtaining Supportive Resources/Stress Management/Housin   On track    Activities and task to complete in order to accomplish goals.   Keep all upcoming appointments discussed today Continue with compliance of taking medication prescribed by Doctor Implement healthy coping skills discussed to assist with management of symptoms Continue working with [facility] care team to assist with goals identified You and/or family inquire about flooring consult          Please call the care guide team at [phone] if you need to cancel or reschedule your appointment.   If you are experiencing a Mental Health or Behavioral Health Crisis or need someone to talk to, please call the Suicide and Crisis Lifeline: 988 call 911   Patient verbalizes understanding of instructions and care plan provided today and agrees to view in MyChart. Active MyChart status and patient understanding of how to access instructions and care plan via MyChart confirmed with patient.     Patient will call LCSW when available  [name], MSW, LCSW [facility] Care Management [facility]  [name] [email or web address] Phone [phone] 5:08 PM

## 2022-10-04 ENCOUNTER — Other Ambulatory Visit (HOSPITAL_COMMUNITY): Payer: Self-pay

## 2022-10-04 ENCOUNTER — Other Ambulatory Visit: Payer: Self-pay

## 2022-10-04 MED ORDER — FLUOXETINE HCL 20 MG PO CAPS
20.0000 mg | ORAL_CAPSULE | Freq: Every day | ORAL | 2 refills | Status: DC
  Filled 2022-10-04 (×2): qty 30, 30d supply, fill #0
  Filled 2022-11-01: qty 30, 30d supply, fill #1
  Filled 2022-11-28: qty 30, 30d supply, fill #2

## 2022-10-04 NOTE — Telephone Encounter (Signed)
Requested Prescriptions  Pending Prescriptions Disp Refills   FLUoxetine (PROZAC) 20 MG capsule 30 capsule 2    Sig: Take 1 capsule (20 mg total) by mouth daily.     Psychiatry:  Antidepressants - SSRI Passed - 10/03/2022  5:40 AM      Passed - Completed PHQ-2 or PHQ-9 in the last 360 days      Passed - Valid encounter within last 6 months    Recent Outpatient Visits           1 month ago Chronic diastolic congestive heart failure, NYHA class 3 (HCC)   [place] [facility] [name], MD   2 months ago COVID-19 virus infection   [place] [name], [facility]   6 months ago Encounter for screening mammogram for malignant neoplasm of breast   [place] [facility] [name], MD   7 months ago Essential hypertension   [place] [facility] [name], MD   10 months ago Essential hypertension   [place] [facility] [name], MD

## 2022-10-11 ENCOUNTER — Telehealth: Payer: Self-pay | Admitting: *Deleted

## 2022-10-11 NOTE — Telephone Encounter (Signed)
Return call from pt- Changed appt to 6/5 @ 11:15.

## 2022-10-11 NOTE — Telephone Encounter (Signed)
LMOM for the patient to call the office back. Need to move the appt on 5/1 to earlier in the morning or to 6/5

## 2022-10-12 ENCOUNTER — Telehealth: Payer: Self-pay | Admitting: Pharmacist

## 2022-10-12 NOTE — Telephone Encounter (Signed)
Called patient to schedule an appointment for medication management. She was identified in a [facility] report as failing medication adherence measures. Additionally, I was hopeful to schedule a BP visit with her. I was unable to reach the patient. VM is full.  Butch Penny, PharmD, Patsy [place], CPP Clinical Pharmacist [facility] [phone]

## 2022-10-19 ENCOUNTER — Inpatient Hospital Stay: Payer: 59 | Admitting: Obstetrics & Gynecology

## 2022-10-27 ENCOUNTER — Other Ambulatory Visit: Payer: Self-pay | Admitting: Nephrology

## 2022-10-27 DIAGNOSIS — I129 Hypertensive chronic kidney disease with stage 1 through stage 4 chronic kidney disease, or unspecified chronic kidney disease: Secondary | ICD-10-CM | POA: Diagnosis not present

## 2022-10-27 DIAGNOSIS — E785 Hyperlipidemia, unspecified: Secondary | ICD-10-CM | POA: Diagnosis not present

## 2022-10-27 DIAGNOSIS — N2581 Secondary hyperparathyroidism of renal origin: Secondary | ICD-10-CM | POA: Diagnosis not present

## 2022-10-27 DIAGNOSIS — N184 Chronic kidney disease, stage 4 (severe): Secondary | ICD-10-CM

## 2022-10-27 DIAGNOSIS — I5030 Unspecified diastolic (congestive) heart failure: Secondary | ICD-10-CM | POA: Diagnosis not present

## 2022-10-27 DIAGNOSIS — D631 Anemia in chronic kidney disease: Secondary | ICD-10-CM | POA: Diagnosis not present

## 2022-10-28 LAB — LAB REPORT - SCANNED
Albumin, Urine POC: 103.8
Albumin/Creatinine Ratio, Urine, POC: 52
Creatinine, POC: 200.9 mg/dL
EGFR: 29

## 2022-11-04 DIAGNOSIS — I129 Hypertensive chronic kidney disease with stage 1 through stage 4 chronic kidney disease, or unspecified chronic kidney disease: Secondary | ICD-10-CM | POA: Diagnosis not present

## 2022-11-10 ENCOUNTER — Other Ambulatory Visit: Payer: Self-pay

## 2022-11-10 ENCOUNTER — Other Ambulatory Visit (HOSPITAL_COMMUNITY): Payer: Self-pay

## 2022-11-10 ENCOUNTER — Telehealth: Payer: Self-pay | Admitting: Family Medicine

## 2022-11-10 NOTE — Telephone Encounter (Signed)
Copied from CRM [id number]. Topic: General - Other >> Nov 10, 2022 12:12 PM Santiya F wrote: Reason for CRM: Pt is calling in requesting to speak with "[name]". Pt says she has been trying to get in touch with [name]. Please follow up with pt.

## 2022-11-15 ENCOUNTER — Other Ambulatory Visit: Payer: Self-pay | Admitting: Family Medicine

## 2022-11-15 DIAGNOSIS — I10 Essential (primary) hypertension: Secondary | ICD-10-CM

## 2022-11-15 DIAGNOSIS — I5032 Chronic diastolic (congestive) heart failure: Secondary | ICD-10-CM

## 2022-11-15 NOTE — Telephone Encounter (Signed)
Pt is calling and requesting to speak with [name].  Pt requesting a callback.

## 2022-11-16 ENCOUNTER — Other Ambulatory Visit (HOSPITAL_COMMUNITY): Payer: Self-pay

## 2022-11-16 ENCOUNTER — Other Ambulatory Visit: Payer: Self-pay

## 2022-11-16 MED ORDER — AMLODIPINE BESYLATE 10 MG PO TABS
10.0000 mg | ORAL_TABLET | Freq: Every day | ORAL | 0 refills | Status: DC
  Filled 2022-11-16 (×2): qty 30, 30d supply, fill #0

## 2022-11-16 MED ORDER — PANTOPRAZOLE SODIUM 40 MG PO TBEC
40.0000 mg | DELAYED_RELEASE_TABLET | Freq: Every day | ORAL | 0 refills | Status: DC
  Filled 2022-11-16 (×2): qty 30, 30d supply, fill #0

## 2022-11-16 MED ORDER — FUROSEMIDE 40 MG PO TABS
40.0000 mg | ORAL_TABLET | Freq: Two times a day (BID) | ORAL | 0 refills | Status: DC
  Filled 2022-11-16 (×2): qty 60, 30d supply, fill #0

## 2022-11-16 MED ORDER — HYDRALAZINE HCL 100 MG PO TABS
100.0000 mg | ORAL_TABLET | Freq: Three times a day (TID) | ORAL | 0 refills | Status: DC
  Filled 2022-11-16 (×2): qty 90, 30d supply, fill #0

## 2022-11-17 ENCOUNTER — Ambulatory Visit: Payer: Self-pay

## 2022-11-17 NOTE — Telephone Encounter (Signed)
     Chief Complaint: Larey Seat at home today, tripped in a hole in the floor. Reports no injury. States she needs to speak to Rhodell her case Financial controller. "She's trying to help me with my living situation." Symptoms: Above Frequency: Today Pertinent Negatives: Patient denies injury Disposition: [] ED /[] Urgent Care (no appt availability in office) / [] Appointment(In office/virtual)/ []  [facility]/ [] Home Care/ [] Refused Recommended Disposition /[] [facility]/ [x]  Follow-up with PCP Additional Notes: Please advise pt.  Reason for Disposition  [1] Recent fall AND [2] no injury  Answer Assessment - Initial Assessment Questions 1. MECHANISM: "How did the fall happen?"     Fell in a hole in the floor 2. DOMESTIC VIOLENCE AND ELDER ABUSE SCREENING: "Did you fall because someone pushed you or tried to hurt you?" If Yes, ask: "Are you safe now?"     No 3. ONSET: "When did the fall happen?" (e.g., minutes, hours, or days ago)     Today 4. LOCATION: "What part of the body hit the ground?" (e.g., back, buttocks, head, hips, knees, hands, head, stomach)     Fell on back 5. INJURY: "Did you hurt (injure) yourself when you fell?" If Yes, ask: "What did you injure? Tell me more about this?" (e.g., body area; type of injury; pain severity)"     Not today 6. PAIN: "Is there any pain?" If Yes, ask: "How bad is the pain?" (e.g., Scale 1-10; or mild,  moderate, severe)   - NONE (0): No pain   - MILD (1-3): Doesn't interfere with normal activities    - MODERATE (4-7): Interferes with normal activities or awakens from sleep    - SEVERE (8-10): Excruciating pain, unable to do any normal activities      6 7. SIZE: For cuts, bruises, or swelling, ask: "How large is it?" (e.g., inches or centimeters)      No 8. PREGNANCY: "Is there any chance you are pregnant?" "When was your last menstrual period?"     No 9. OTHER SYMPTOMS: "Do you have any other symptoms?" (e.g., dizziness, fever, weakness;  new onset or worsening).      No 10. CAUSE: "What do you think caused the fall (or falling)?" (e.g., tripped, dizzy spell)       Tripped  Protocols used: Falls and [facility]

## 2022-11-21 ENCOUNTER — Ambulatory Visit
Admission: RE | Admit: 2022-11-21 | Discharge: 2022-11-21 | Disposition: A | Discharge status: Home / Self Care | Payer: 59 | Source: Ambulatory Visit | Attending: Nephrology | Admitting: Nephrology

## 2022-11-21 DIAGNOSIS — N184 Chronic kidney disease, stage 4 (severe): Secondary | ICD-10-CM

## 2022-11-21 DIAGNOSIS — R93421 Abnormal radiologic findings on diagnostic imaging of right kidney: Secondary | ICD-10-CM | POA: Diagnosis not present

## 2022-11-22 NOTE — Progress Notes (Signed)
Follow Up Note: Gyn-Onc  Angela Gardner 62 y.o. female  CC: She returns for a f/u visit   HPI: The oncology history was reviewed.  Interval History: She denies any vaginal bleeding, abdominal/pelvic pain, cough, lethargy or abdominal distention. She was seen by RAD-ONC in 10/23 and was felt to have no clinical evidence of recurrence on exam.    Review of Systems  Review of Systems  Constitutional:  Negative for malaise/fatigue and weight loss.  Respiratory:  Negative for shortness of breath and wheezing.   Cardiovascular:  Negative for chest pain and leg swelling.  Gastrointestinal:  Negative for abdominal pain, blood in stool, constipation, nausea and vomiting.  Genitourinary:  Negative for dysuria, frequency, hematuria and urgency.  Musculoskeletal:  Negative for joint pain and myalgias.  Neurological:  Negative for weakness.  Psychiatric/Behavioral:  Negative for depression. The patient does not have insomnia.    Current medications, allergy, social history, past surgical history, past medical history, family history were all reviewed.    Vitals:  LMP  (LMP Unknown) Comment: vaginal bleeding continuously for last 8 months   Physical Exam:  Physical Exam Exam conducted with a chaperone present.  Constitutional:      General: She is not in acute distress. Cardiovascular:     Rate and Rhythm: Normal rate and regular rhythm.  Pulmonary:     Effort: Pulmonary effort is normal.     Breath sounds: Normal breath sounds. No wheezing or rhonchi.  Abdominal:     Palpations: Abdomen is soft.     Tenderness: There is no abdominal tenderness. There is no right CVA tenderness or left CVA tenderness.     Hernia: No hernia is present.  Genitourinary:    General: Normal vulva.     Urethra: No urethral lesion.     Vagina: No lesions. No bleeding Musculoskeletal:     Cervical back: Neck supple.     Right lower leg: No edema.     Left lower leg: No edema.  Lymphadenopathy:     Upper  Body:     Right upper body: No supraclavicular adenopathy.     Left upper body: No supraclavicular adenopathy.     Lower Body: No right inguinal adenopathy. No left inguinal adenopathy.  Skin:    Findings: No rash.  Neurological:     Mental Status: She is oriented to person, place, and time.   Assessment/Plan:  No problem-specific Assessment & Plan notes found for this encounter.     Antionette Char, MD

## 2022-11-22 NOTE — Assessment & Plan Note (Signed)
Ms. Diksha Feo  is a 62 y.o.  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 High risk factors for recurrence given the unfavorable cell type.   S/p adjuvant therapy with 6cycles of carb/taxol and vaginal brachytherapy completed November, 2019. Negative symptom review, normal exam.  No evidence of recurrence     > Recommend follow-up with Dr Roselind Messier in 6 months and return in 1 yr.

## 2022-11-23 ENCOUNTER — Telehealth: Payer: Self-pay | Admitting: Surgery

## 2022-11-23 ENCOUNTER — Ambulatory Visit (HOSPITAL_BASED_OUTPATIENT_CLINIC_OR_DEPARTMENT_OTHER): Payer: Self-pay | Admitting: Obstetrics & Gynecology

## 2022-11-23 DIAGNOSIS — C541 Malignant neoplasm of endometrium: Secondary | ICD-10-CM

## 2022-11-23 NOTE — Telephone Encounter (Signed)
Pt called and LVM stating she just woke up and realized she missed her appt today and would like to reschedule. Our office will call her back to reschedule this appointment.

## 2022-11-29 ENCOUNTER — Telehealth: Payer: Self-pay | Admitting: *Deleted

## 2022-11-29 NOTE — Progress Notes (Signed)
  Care Coordination Note  11/29/2022 Name: [name] MRN: [id number] DOB: 1961-04-20  Angela Gardner is a 62 y.o. year old female who is a primary care patient of [name], MD and is actively engaged with the care management team. [name] called by phone today to assist with scheduling a follow up visit with the Licensed Clinical Social Worker  Follow up plan: Telephone appointment with care management team member scheduled for:11/30/22  [facility]  Care Coordination Care Guide  Direct Dial: [phone]

## 2022-11-30 ENCOUNTER — Encounter: Payer: Self-pay | Admitting: Licensed Clinical Social Worker

## 2022-12-02 ENCOUNTER — Telehealth: Payer: Self-pay | Admitting: Licensed Clinical Social Worker

## 2022-12-02 ENCOUNTER — Telehealth: Payer: Self-pay | Admitting: *Deleted

## 2022-12-02 NOTE — Telephone Encounter (Signed)
Patient scheduled for an appt with Dr Tamela Oddi on 7/24 at 11:30 am. Patient message sent via my chart. Several attempted made to reach patient via phone with no results.

## 2022-12-02 NOTE — Patient Outreach (Signed)
  Care Coordination   11/30/2022 Name: Angela Gardner MRN: [id number] DOB: [birth year]   Care Coordination Outreach Attempts:  An unsuccessful telephone outreach was attempted for a scheduled appointment today.  Follow Up Plan:  Additional outreach attempts will be made to offer the patient care coordination information and services.   Encounter Outcome:  No Answer   Care Coordination Interventions:  No, not indicated    [name], MSW, LCSW [facility] Care Management [place]  [name] [email or web address] Phone [phone] 5:42 AM

## 2022-12-07 NOTE — Telephone Encounter (Signed)
Pt is calling back to speak with [name]. Please call pt at [phone].

## 2022-12-08 ENCOUNTER — Telehealth: Payer: Self-pay | Admitting: Licensed Clinical Social Worker

## 2022-12-08 NOTE — Patient Instructions (Signed)
Visit Information  Thank you for taking time to visit with me today. Please don't hesitate to contact me if I can be of assistance to you.   Following are the goals we discussed today:   Goals Addressed             This Visit's Progress    Obtaining Supportive Resources/Stress Management/Housin   On track    Activities and task to complete in order to accomplish goals.   Keep all upcoming appointments discussed today Continue with compliance of taking medication prescribed by Doctor Implement healthy coping skills discussed to assist with management of symptoms Continue working with [facility] care team to assist with goals identified Obtain invoice on work needed on your home to provide to local agencies          Please call the care guide team at [phone] if you need to cancel or reschedule your appointment.   If you are experiencing a Mental Health or Behavioral Health Crisis or need someone to talk to, please call the Suicide and Crisis Lifeline: 988 call 911   Patient verbalizes understanding of instructions and care plan provided today and agrees to view in MyChart. Active MyChart status and patient understanding of how to access instructions and care plan via MyChart confirmed with patient.     [name], MSW, LCSW [facility] Care Management [place]  [name] [email or web address] Phone [phone] 4:16 PM

## 2022-12-08 NOTE — Patient Outreach (Signed)
  Care Coordination   Follow Up Visit Note   12/07/2022 Name: Angela Gardner MRN: [id number] DOB: [birth year]  Angela Gardner is a 62 y.o. year old female who sees [name], MD for primary care. I spoke with  Angela Gardner by phone today.  What matters to the patients health and wellness today?  Housing    Goals Addressed             This Visit's Progress    Obtaining Supportive Resources/Stress Management/Housin   On track    Activities and task to complete in order to accomplish goals.   Keep all upcoming appointments discussed today Continue with compliance of taking medication prescribed by Doctor Implement healthy coping skills discussed to assist with management of symptoms Continue working with [facility] care team to assist with goals identified Obtain invoice on work needed on your home to provide to local agencies          SDOH assessments and interventions completed:  No     Care Coordination Interventions:  Yes, provided  Interventions Today    Flowsheet Row Most Recent Value  Chronic Disease   Chronic disease during today's visit Hypertension (HTN), Congestive Heart Failure (CHF), Chronic Kidney Disease/End Stage Renal Disease (ESRD)  General Interventions   General Interventions Discussed/Reviewed General Interventions Reviewed, [employer] discussed community resources that assist with funds to assist with housing. Strategies discussed to commmunicate needs to supports (family, neighbors, etc)]  Mental Health Interventions   Mental Health Discussed/Reviewed Mental Health Reviewed, Coping Strategies, Anxiety, Depression  Nutrition Interventions   Nutrition Discussed/Reviewed Nutrition Reviewed  Pharmacy Interventions   Pharmacy Dicussed/Reviewed Pharmacy Topics Reviewed, Medication Adherence  Safety Interventions   Safety Discussed/Reviewed Safety Reviewed       Follow up plan: Follow up call scheduled for 2-4 weeks    Encounter  Outcome:  Pt. Visit Completed   Angela Gardner, MSW, LCSW [facility] Care Management [facility]  [name] [email or web address] Phone [phone] 4:16 PM

## 2022-12-09 ENCOUNTER — Other Ambulatory Visit: Payer: Self-pay | Admitting: Family Medicine

## 2022-12-09 ENCOUNTER — Other Ambulatory Visit: Payer: Self-pay

## 2022-12-09 DIAGNOSIS — I5032 Chronic diastolic (congestive) heart failure: Secondary | ICD-10-CM

## 2022-12-09 DIAGNOSIS — I10 Essential (primary) hypertension: Secondary | ICD-10-CM

## 2022-12-15 ENCOUNTER — Other Ambulatory Visit: Payer: Self-pay | Admitting: Family Medicine

## 2022-12-15 DIAGNOSIS — I10 Essential (primary) hypertension: Secondary | ICD-10-CM

## 2022-12-16 ENCOUNTER — Other Ambulatory Visit: Payer: Self-pay

## 2022-12-26 ENCOUNTER — Other Ambulatory Visit: Payer: Self-pay | Admitting: Family Medicine

## 2022-12-26 ENCOUNTER — Other Ambulatory Visit (HOSPITAL_COMMUNITY): Payer: Self-pay

## 2022-12-26 ENCOUNTER — Other Ambulatory Visit: Payer: Self-pay

## 2022-12-26 MED ORDER — FLUOXETINE HCL 20 MG PO CAPS
20.0000 mg | ORAL_CAPSULE | Freq: Every day | ORAL | 0 refills | Status: DC
  Filled 2022-12-26 (×2): qty 30, 30d supply, fill #0

## 2022-12-28 ENCOUNTER — Other Ambulatory Visit: Payer: Self-pay

## 2022-12-29 ENCOUNTER — Other Ambulatory Visit (HOSPITAL_COMMUNITY): Payer: Self-pay

## 2022-12-29 MED ORDER — LOSARTAN POTASSIUM 50 MG PO TABS
50.0000 mg | ORAL_TABLET | Freq: Every day | ORAL | 3 refills | Status: DC
  Filled 2022-12-29: qty 30, 30d supply, fill #0
  Filled 2023-01-23: qty 30, 30d supply, fill #1
  Filled 2023-02-21: qty 30, 30d supply, fill #2
  Filled 2023-03-23: qty 30, 30d supply, fill #3

## 2023-01-04 DIAGNOSIS — I129 Hypertensive chronic kidney disease with stage 1 through stage 4 chronic kidney disease, or unspecified chronic kidney disease: Secondary | ICD-10-CM | POA: Diagnosis not present

## 2023-01-11 ENCOUNTER — Other Ambulatory Visit: Payer: Self-pay

## 2023-01-11 ENCOUNTER — Ambulatory Visit: Payer: Self-pay

## 2023-01-11 ENCOUNTER — Telehealth: Payer: Self-pay

## 2023-01-11 ENCOUNTER — Inpatient Hospital Stay: Payer: 59 | Attending: Obstetrics & Gynecology | Admitting: Obstetrics & Gynecology

## 2023-01-11 ENCOUNTER — Encounter: Payer: Self-pay | Admitting: Obstetrics & Gynecology

## 2023-01-11 VITALS — BP 188/110 | HR 78 | Temp 98.6°F | Resp 18 | Ht 64.0 in | Wt 220.4 lb

## 2023-01-11 DIAGNOSIS — Z923 Personal history of irradiation: Secondary | ICD-10-CM | POA: Insufficient documentation

## 2023-01-11 DIAGNOSIS — Z9221 Personal history of antineoplastic chemotherapy: Secondary | ICD-10-CM | POA: Insufficient documentation

## 2023-01-11 DIAGNOSIS — Z8542 Personal history of malignant neoplasm of other parts of uterus: Secondary | ICD-10-CM | POA: Diagnosis present

## 2023-01-11 DIAGNOSIS — C541 Malignant neoplasm of endometrium: Secondary | ICD-10-CM

## 2023-01-11 DIAGNOSIS — I10 Essential (primary) hypertension: Secondary | ICD-10-CM | POA: Insufficient documentation

## 2023-01-11 NOTE — Telephone Encounter (Signed)
Angela Gardner was seen in the office today by Dr. Tamela Oddi. Upon arrival nurse tech Angela Gardner reported an elevated BP of 199/94 (1st check)  222/108 (recheck) MD notified by Angela Gardner.   I rechecked BP at the end of visit, pt had been sitting x10 minutes. BP was 188/110. MD notified and advised pt follow up with her PCP, Angela Gardner.   Pt aware and states she has not taken medication this morning and does not take it on a regular basis because it "makes me feel weird". I strongly recommended she call her PCP today and get an appointment. Pt responded with "ok".   I spoke to Angela Gardner at the patient engagement center with [name]'s office and notified her of the BP's. Angela Gardner states she will call Angela Gardner and get her scheduled.

## 2023-01-11 NOTE — Progress Notes (Signed)
Follow Up Note: Gyn-Onc  Angela Gardner 62 y.o. female  CC: She returns for a f/u visit   HPI: The oncology history was reviewed.  Interval History: She denies any vaginal bleeding, abdominal/pelvic pain, cough, lethargy or abdominal distention. She was seen by RAD-ONC in 10/23 and was felt to have no clinical evidence of recurrence on exam.    Review of Systems  Review of Systems  Constitutional:  Negative for malaise/fatigue and weight loss.  Respiratory:  Negative for shortness of breath and wheezing.   Cardiovascular:  Negative for chest pain and leg swelling.  Gastrointestinal:  Negative for abdominal pain, blood in stool, constipation, nausea and vomiting.  Genitourinary:  Negative for dysuria, frequency, hematuria and urgency.  Musculoskeletal:  Negative for joint pain and myalgias.  Neurological:  Positive for a mild HA, negative for weakness.  Psychiatric/Behavioral:  Negative for depression. The patient does not have insomnia.    Current medications, allergy, social history, past surgical history, past medical history, family history were all reviewed.    Vitals:  BP (!) 188/110 (BP Location: Left Arm, Patient Position: Sitting) Comment: MD notified,(pt states she has not taken her BP med this morning, pt is aware to follow up with her PCP  Pulse 78   Temp 98.6 F (37 C) (Oral)   Resp 18   Ht 5\' 4"  (1.626 m)   Wt 220 lb 6.4 oz (100 kg)   SpO2 97%   BMI 37.83 kg/m      Physical Exam Exam conducted with a chaperone present.  Constitutional:      General: She is not in acute distress. Cardiovascular:     Rate and Rhythm: Normal rate and regular rhythm.  Pulmonary:     Effort: Pulmonary effort is normal.     Breath sounds: Normal breath sounds. No wheezing or rhonchi.  Abdominal:     Palpations: Abdomen is soft.     Tenderness: There is no abdominal tenderness. There is no right CVA tenderness or left CVA tenderness.     Hernia: No hernia is present.   Genitourinary:    General: Normal vulva.     Urethra: No urethral lesion.     Vagina: No lesions. No bleeding Musculoskeletal:     Cervical back: Neck supple.     Right lower leg: No edema.     Left lower leg: No edema.  Lymphadenopathy:     Upper Body:     Right upper body: No supraclavicular adenopathy.     Left upper body: No supraclavicular adenopathy.     Lower Body: No right inguinal adenopathy. No left inguinal adenopathy.  Skin:    Findings: No rash.  Neurological:     Mental Status: She is oriented to person, place, and time.   Assessment/Plan:  Endometrial cancer (HCC) [name]  is a 62 y.o.  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 High risk factors for recurrence given the unfavorable cell type. H/O chronic HTN, B/Ps poorly controlled  S/p adjuvant therapy with 6cycles of carb/taxol and vaginal brachytherapy completed November, 2019. Negative symptom review, normal exam.  No evidence of recurrence   >Advised follow-up w/PCP > Recommend follow-up with Dr Roselind Messier in 3 mos and return in 1 yr.     I personally spent 25 minutes face-to-face and non-face-to-face in the care of this patient, which includes all pre, intra, and post visit time on the date of service.    Antionette Char, MD

## 2023-01-11 NOTE — Telephone Encounter (Signed)
Called to patient To advise patient to take BP medication as ordered, take BP each morning and keep record. Also wanted to schedule appointment her with PCP. Unable to reach Message Left on VM to return call the office.

## 2023-01-11 NOTE — Patient Instructions (Addendum)
Return in 1 year, Follow up with your primary care provider for increased blood pressure.

## 2023-01-11 NOTE — Telephone Encounter (Signed)
Kim with [facility], reports pt.'s BP up today. 222/108, then 188/110 after rest. Pt. Reports not taking BP medications. Kim Requests appointment for follow up. Called pt. To see if having symptoms. Left message. Please advise pt.

## 2023-01-11 NOTE — Assessment & Plan Note (Addendum)
[name]  is a 62 y.o.  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 High risk factors for recurrence given the unfavorable cell type. H/O chronic HTN, B/Ps poorly controlled  S/p adjuvant therapy with 6cycles of carb/taxol and vaginal brachytherapy completed November, 2019. Negative symptom review, normal exam.  No evidence of recurrence   >Advised follow-up w/PCP > Recommend follow-up with Dr Roselind Messier in 3 mos and return in 1 yr.

## 2023-01-18 ENCOUNTER — Telehealth: Payer: Self-pay | Admitting: Licensed Clinical Social Worker

## 2023-01-19 NOTE — Patient Outreach (Signed)
  Care Coordination   Follow Up Visit Note   01/18/2023 Name: Angela Gardner MRN: [id number] DOB: 05-11-1961  Angela Gardner is a 62 y.o. year old female who sees Angela Register, MD for primary care. I spoke with  Angela Gardner by phone today.  What matters to the patients health and wellness today?  Symptom Management and Housing     Goals Addressed             This Visit's Progress    Obtaining Supportive Resources/Stress Management/Housin   On track    Activities and task to complete in order to accomplish goals.   Keep all upcoming appointments discussed today Continue with compliance of taking medication prescribed by Doctor Implement healthy coping skills discussed to assist with management of symptoms Continue working with [name] care team to assist with goals identified Obtain invoice on work needed on your home to provide to local agencies Implement strategies discussed to assist with decreasing soda intake          SDOH assessments and interventions completed:  No     Care Coordination Interventions:  Yes, provided  Interventions Today    Flowsheet Row Most Recent Value  Chronic Disease   Chronic disease during today's visit Hypertension (HTN), Congestive Heart Failure (CHF), Chronic Kidney Disease/End Stage Renal Disease (ESRD)  General Interventions   General Interventions Discussed/Reviewed General Interventions Reviewed, Doctor Visits, Community Resources  Doctor Visits Discussed/Reviewed Doctor Visits Reviewed  Mental Health Interventions   Mental Health Discussed/Reviewed Mental Health Reviewed, Coping Strategies, Depression, Anxiety  Nutrition Interventions   Nutrition Discussed/Reviewed Nutrition Reviewed, Decreasing sugar intake, Fluid intake  [SMART goals to assist with decreasing soda intake and improving water intake]  Pharmacy Interventions   Pharmacy Dicussed/Reviewed Pharmacy Topics Reviewed, Medication Adherence  Safety Interventions    Safety Discussed/Reviewed Safety Reviewed       Follow up plan: Follow up call scheduled for 2-4 weeks    Encounter Outcome:  Pt. Visit Completed   Angela Gardner, MSW, LCSW [facility] Care Management [facility]  [name] [email or web address] Phone [phone] 4:47 PM

## 2023-01-19 NOTE — Patient Instructions (Signed)
Visit Information  Thank you for taking time to visit with me today. Please don't hesitate to contact me if I can be of assistance to you.   Following are the goals we discussed today:   Goals Addressed             This Visit's Progress    Obtaining Supportive Resources/Stress Management/Housin   On track    Activities and task to complete in order to accomplish goals.   Keep all upcoming appointments discussed today Continue with compliance of taking medication prescribed by Doctor Implement healthy coping skills discussed to assist with management of symptoms Continue working with [facility] care team to assist with goals identified Obtain invoice on work needed on your home to provide to local agencies Implement strategies discussed to assist with decreasing soda intake          Our next appointment is by telephone on 08/28 at 1 PM  Please call the care guide team at [phone] if you need to cancel or reschedule your appointment.   If you are experiencing a Mental Health or Behavioral Health Crisis or need someone to talk to, please call the Suicide and Crisis Lifeline: 988 call 911   Patient verbalizes understanding of instructions and care plan provided today and agrees to view in MyChart. Active MyChart status and patient understanding of how to access instructions and care plan via MyChart confirmed with patient.     [name], MSW, LCSW [facility] Care Management [place]  [name] [email or web address] Phone [phone] 4:48 PM

## 2023-01-23 ENCOUNTER — Other Ambulatory Visit: Payer: Self-pay | Admitting: Family Medicine

## 2023-01-23 ENCOUNTER — Other Ambulatory Visit: Payer: Self-pay

## 2023-01-24 ENCOUNTER — Other Ambulatory Visit: Payer: Self-pay

## 2023-01-24 ENCOUNTER — Encounter: Payer: Self-pay | Admitting: Pharmacist

## 2023-01-27 DIAGNOSIS — N184 Chronic kidney disease, stage 4 (severe): Secondary | ICD-10-CM | POA: Diagnosis not present

## 2023-01-27 DIAGNOSIS — D631 Anemia in chronic kidney disease: Secondary | ICD-10-CM | POA: Diagnosis not present

## 2023-01-27 DIAGNOSIS — N2581 Secondary hyperparathyroidism of renal origin: Secondary | ICD-10-CM | POA: Diagnosis not present

## 2023-01-27 DIAGNOSIS — I5030 Unspecified diastolic (congestive) heart failure: Secondary | ICD-10-CM | POA: Diagnosis not present

## 2023-01-27 DIAGNOSIS — I129 Hypertensive chronic kidney disease with stage 1 through stage 4 chronic kidney disease, or unspecified chronic kidney disease: Secondary | ICD-10-CM | POA: Diagnosis not present

## 2023-01-27 LAB — LAB REPORT - SCANNED: EGFR: 31

## 2023-02-08 ENCOUNTER — Other Ambulatory Visit: Payer: Self-pay | Admitting: Family Medicine

## 2023-02-08 ENCOUNTER — Other Ambulatory Visit (HOSPITAL_COMMUNITY): Payer: Self-pay

## 2023-02-08 ENCOUNTER — Other Ambulatory Visit: Payer: Self-pay

## 2023-02-08 DIAGNOSIS — I5032 Chronic diastolic (congestive) heart failure: Secondary | ICD-10-CM

## 2023-02-08 MED ORDER — ATORVASTATIN CALCIUM 80 MG PO TABS
80.0000 mg | ORAL_TABLET | Freq: Every day | ORAL | 0 refills | Status: DC
  Filled 2023-02-08 (×2): qty 30, 30d supply, fill #0

## 2023-02-14 ENCOUNTER — Ambulatory Visit: Payer: 59 | Attending: Family Medicine

## 2023-02-14 VITALS — Ht 64.0 in | Wt 220.0 lb

## 2023-02-14 DIAGNOSIS — Z0000 Encounter for general adult medical examination without abnormal findings: Secondary | ICD-10-CM | POA: Diagnosis not present

## 2023-02-14 NOTE — Patient Instructions (Addendum)
Angela Gardner , Thank you for taking time to come for your Medicare Wellness Visit. I appreciate your ongoing commitment to your health goals. Please review the following plan we discussed and let me know if I can assist you in the future.   Referrals/Orders/Follow-Ups/Clinician Recommendations: Aim for 30 minutes of exercise or brisk walking, 6-8 glasses of water, and 5 servings of fruits and vegetables each day.  This is a list of the screening recommended for you and due dates:  Health Maintenance  Topic Date Due   COVID-19 Vaccine (1) Never done   DTaP/Tdap/Td vaccine (1 - Tdap) Never done   Zoster (Shingles) Vaccine (1 of 2) Never done   Flu Shot  01/19/2023   Colon Cancer Screening  02/14/2024*   Medicare Annual Wellness Visit  02/14/2024   Mammogram  08/17/2024   Hepatitis C Screening  Completed   HIV Screening  Completed   HPV Vaccine  Aged Out   Pap Smear  Discontinued  *Topic was postponed. The date shown is not the original due date.    Advanced directives: (ACP Link)Information on Advanced Care Planning can be found at [facility] Advance Health Care Directives Advance Health Care Directives ([email or web address]   Next Medicare Annual Wellness Visit scheduled for next year: Yes

## 2023-02-14 NOTE — Progress Notes (Signed)
Subjective:   Angela Gardner is a 62 y.o. female who presents for Medicare Annual (Subsequent) preventive examination.  Visit Complete: Virtual  I connected with  [name] on 02/14/23 by a audio enabled telemedicine application and verified that I am speaking with the correct person using two identifiers.  Patient Location: Home  Provider Location: Home Office  I discussed the limitations of evaluation and management by telemedicine. The patient expressed understanding and agreed to proceed.  Vital Signs: Because this visit was a virtual/telehealth visit, some criteria may be missing or patient reported. Any vitals not documented were not able to be obtained and vitals that have been documented are patient reported.   Review of Systems     Cardiac Risk Factors include: advanced age (>43men, >48 women);dyslipidemia;hypertension     Objective:    Today's Vitals   02/14/23 1045  Weight: 220 lb (99.8 kg)  Height: 5\' 4"  (1.626 m)   Body mass index is 37.76 kg/m.     02/14/2023   10:52 AM 05/31/2022    3:08 PM 04/23/2022    2:55 PM 04/11/2022   10:40 AM 04/05/2021   11:14 AM 01/07/2021    5:59 PM 01/02/2021    5:19 PM  Advanced Directives  Does Patient Have a Medical Advance Directive? No Yes No No Yes No No  Type of Advance Directive  Healthcare Power of Attorney       Does patient want to make changes to medical advance directive?     No - Patient declined    Copy of Healthcare Power of Attorney in Chart?  No - copy requested       Would patient like information on creating a medical advance directive? Yes (MAU/Ambulatory/Procedural Areas - Information given)     No - Patient declined No - Patient declined    Current Medications (verified) Outpatient Encounter Medications as of 02/14/2023  Medication Sig   amLODipine (NORVASC) 10 MG tablet Take 1 tablet (10 mg total) by mouth daily. Please schedule an appointment with Dr. Alvis Lemmings   atorvastatin (LIPITOR) 80 MG  tablet Take 1 tablet (80 mg total) by mouth daily. Please make an appointment with Dr. Alvis Lemmings.   buPROPion (WELLBUTRIN XL) 150 MG 24 hr tablet Take 1 tablet (150 mg total) by mouth daily for smoking cessation   FLUoxetine (PROZAC) 20 MG capsule Take 1 capsule (20 mg total) by mouth daily.Please make PCP appointment for more refills.   furosemide (LASIX) 40 MG tablet Take 1 tablet (40 mg total) by mouth 2 (two) times daily. Please schedule an appointment with Dr. Alvis Lemmings   hydrALAZINE (APRESOLINE) 100 MG tablet Take 1 tablet (100 mg total) by mouth 3 (three) times daily. Please schedule an appointment with Dr. Alvis Lemmings   losartan (COZAAR) 50 MG tablet Take 1 tablet (50 mg total) by mouth daily.   Misc. Devices MISC Bedside commode.  Diagnosis urinary incontinence   pantoprazole (PROTONIX) 40 MG tablet Take 1 tablet (40 mg total) by mouth daily. Please schedule an appointment with Dr. Alvis Lemmings   [DISCONTINUED] prochlorperazine (COMPAZINE) 10 MG tablet Take 1 tablet (10 mg total) by mouth every 6 (six) hours as needed (Nausea or vomiting). (Patient not taking: Reported on 05/25/2018)   No facility-administered encounter medications on file as of 02/14/2023.    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   Cancer (HCC)    left ductal papilloma   Chest discomfort 09/12/2017   Negative stress test in May 2019   Chest pain 12/20/2019   CHF (congestive heart failure) (HCC)    Chronic kidney disease (CKD), stage III (moderate) 10/30/2017   Deficiency anemia 12/06/2017   Depression 04/02/2018   Diastolic congestive heart failure, NYHA class 3 (HCC) 10/27/2017    Diffuse pain 01/04/2018   Dysfunctional uterine bleeding 09/12/2017   Dyspnea    still having this and not moving around alot -gets worse with exertion   Endometrial cancer (HCC) 10/19/2017   Essential hypertension 10/27/2017   History of radiation therapy    Endometrium- 01/16/18-02/21/18- Dr. 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   PMB (postmenopausal bleeding) 06/17/2015   Prediabetes 04/02/2018   Preventative health care 11/07/2018   Renal disorder    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   Vitamin D deficiency 02/15/2018   Past Surgical History:  Procedure Laterality Date   BREAST EXCISIONAL BIOPSY Left 2013   CESAREAN SECTION     one   CHOLECYSTECTOMY     DILATION AND CURETTAGE OF UTERUS     approx 1 year ago   IR IMAGING GUIDED PORT INSERTION  12/06/2017   IR REMOVAL TUN ACCESS W/ PORT W/O FL MOD SED  06/19/2018   LYMPH NODE BIOPSY N/A 11/14/2017   Procedure: SENTINEL LYMPH NODE BIOPSY;  Surgeon: Adolphus Birchwood, MD;  Location: [facility];  Service: Gynecology;  Laterality: N/A;   ROBOTIC ASSISTED TOTAL HYSTERECTOMY WITH BILATERAL SALPINGO OOPHERECTOMY N/A 11/14/2017   Procedure: XI ROBOTIC ASSISTED TOTAL HYSTERECTOMY WITH BILATERAL SALPINGO OOPHORECTOMY, RIGHT PELVIC LYMPHADENECTOMY;  Surgeon: Adolphus Birchwood, MD;  Location: [facility];  Service: Gynecology;  Laterality: N/A;   Family History  Problem Relation Age  of Onset   Anuerysm Mother    Congestive Heart Failure Mother    Hypertension Father    Hypertension Sister    Hypertension Brother    Colon cancer Neg Hx    Social History   Socioeconomic History   Marital status: Single    Spouse name: Not on file   Number of children: 1   Years of education: Not on file   Highest education level: 9th grade  Occupational History   Not on file  Tobacco Use   Smoking status: Former    Current packs/day: 0.30    Average packs/day: 0.3 packs/day for 26.0 years (7.8 ttl pk-yrs)    Types: Cigarettes   Smokeless tobacco: Never   Tobacco comments:    quit May 2019  Vaping Use   Vaping status: Never Used  Substance and Sexual Activity   Alcohol use: Not Currently  Comment: social drinking   Drug use: Not Currently    Types: Cocaine, "Crack" cocaine   Sexual activity: Not Currently    Birth control/protection: Post-menopausal  Other Topics Concern   Not on file  Social History Narrative   Not on file   Social Determinants of Health   Financial Resource Strain: Low Risk  (02/14/2023)   Overall Financial Resource Strain (CARDIA)    Difficulty of Paying Living Expenses: Not hard at all  Food Insecurity: No Food Insecurity (02/14/2023)   Hunger Vital Sign    Worried About Running Out of Food in the Last Year: Never true    Ran Out of Food in the Last Year: Never true  Transportation Needs: No Transportation Needs (02/14/2023)   PRAPARE - Administrator, Civil Service (Medical): No    Lack of Transportation (Non-Medical): No  Physical Activity: Inactive (02/14/2023)   Exercise Vital Sign    Days of Exercise per Week: 0 days    Minutes of Exercise per Session: 0 min  Stress: No Stress Concern Present (02/14/2023)   [employer] of Occupational Health - Occupational Stress Questionnaire    Feeling of Stress : Not at all  Social Connections: Socially Isolated (02/14/2023)   Social Connection and Isolation Panel [NHANES]     Frequency of Communication with Friends and Family: More than three times a week    Frequency of Social Gatherings with Friends and Family: Twice a week    Attends Religious Services: Never    Database administrator or Organizations: No    Attends Banker Meetings: Never    Marital Status: Never married    Tobacco Counseling Counseling given: Not Answered Tobacco comments: quit May 2019   Clinical Intake:  Pre-visit preparation completed: Yes  Pain : No/denies pain     Diabetes: No  How often do you need to have someone help you when you read instructions, pamphlets, or other written materials from your doctor or pharmacy?: 1 - Never  Interpreter Needed?: No  Information entered by :: Kandis Fantasia LPN   Activities of Daily Living    02/14/2023   10:52 AM 05/31/2022    3:11 PM  In your present state of health, do you have any difficulty performing the following activities:  Hearing? 0 0  Vision? 0 0  Difficulty concentrating or making decisions? 0 0  Walking or climbing stairs? 0 1  Dressing or bathing? 0 0  Doing errands, shopping? 0 0  Preparing Food and eating ? N N  Using the Toilet? N N  In the past six months, have you accidently leaked urine? N Y  Do you have problems with loss of bowel control? N N  Managing your Medications? N N  Managing your Finances? N N  Housekeeping or managing your Housekeeping? N N    Patient Care Team: [name], MD as PCP - General (Family Medicine) Georgeanna Lea, MD as PCP - Cardiology (Cardiology) Bridgett Larsson, LCSW as Social Worker (Licensed Clinical Social Worker)  Indicate any recent [employer] you may have received from other than [facility] providers in the past year (date may be approximate).     Assessment:   This is a routine wellness examination for [place].  Hearing/Vision screen Hearing Screening - Comments:: Denies hearing difficulties   Vision Screening - Comments:: No vision  problems; will schedule routine eye exam soon    Dietary issues and exercise activities discussed:  Goals Addressed             This Visit's Progress    COMPLETED: Obtaining Supportive Resources/Stress Management/Housin       Activities and task to complete in order to accomplish goals.   Keep all upcoming appointments discussed today Continue with compliance of taking medication prescribed by Doctor Implement healthy coping skills discussed to assist with management of symptoms Continue working with [facility] care team to assist with goals identified Obtain invoice on work needed on your home to provide to local agencies Implement strategies discussed to assist with decreasing soda intake       COMPLETED: Patient Stated       05/31/2022, looking for somewhere to stay      Remain active and independent         Depression Screen    02/14/2023   10:50 AM 09/23/2022    9:37 AM 05/31/2022    3:10 PM 03/07/2022    4:23 PM 07/02/2019    1:13 PM 07/03/2018   11:50 AM 04/09/2018   10:43 AM  PHQ 2/9 Scores  PHQ - 2 Score 3 5 0 6 0 5 4  PHQ- 9 Score 10 20  19  0 20 10    Fall Risk    02/14/2023   10:51 AM 05/31/2022    3:09 PM 03/07/2022    4:13 PM 01/07/2020   11:41 AM 07/02/2019   10:26 AM  Fall Risk   Falls in the past year? 0 1 1 0 0  Comment  tripping     Number falls in past yr: 0 1 0    Injury with Fall? 0 0 1    Risk for fall due to : No Fall Risks Medication side effect;Impaired mobility No Fall Risks No Fall Risks   Follow up Falls prevention discussed;Education provided;Falls evaluation completed Falls evaluation completed;Education provided;Falls prevention discussed       MEDICARE RISK AT HOME: Medicare Risk at Home Any stairs in or around the home?: No If so, are there any without handrails?: No Home free of loose throw rugs in walkways, pet beds, electrical cords, etc?: Yes Adequate lighting in your home to reduce risk of falls?: Yes Life alert?: No Use of  a cane, walker or w/c?: No Grab bars in the bathroom?: Yes Shower chair or bench in shower?: No Elevated toilet seat or a handicapped toilet?: No  TIMED UP AND GO:  Was the test performed?  No    Cognitive Function:        02/14/2023   10:52 AM 05/31/2022    3:13 PM  6CIT Screen  What Year? 0 points 0 points  What month? 0 points 0 points  What time? 0 points 3 points  Count back from 20 0 points 0 points  Months in reverse 2 points 0 points  Repeat phrase 2 points 4 points  Total Score 4 points 7 points    Immunizations Immunization History  Administered Date(s) Administered   Influenza,inj,Quad PF,6+ Mos 07/03/2018, 03/08/2022   Influenza-Unspecified 07/03/2018, 05/14/2019    TDAP status: Due, Education has been provided regarding the importance of this vaccine. Advised may receive this vaccine at local pharmacy or Health Dept. Aware to provide a copy of the vaccination record if obtained from local pharmacy or Health Dept. Verbalized acceptance and understanding.  Flu Vaccine status: Due, Education has been provided regarding the importance of this vaccine. Advised may receive this vaccine at local pharmacy or Health Dept. Aware to  provide a copy of the vaccination record if obtained from local pharmacy or Health Dept. Verbalized acceptance and understanding.  Pneumococcal vaccine status: Up to date  Covid-19 vaccine status: Information provided on how to obtain vaccines.   Qualifies for Shingles Vaccine? Yes   Zostavax completed No   Shingrix Completed?: No.    Education has been provided regarding the importance of this vaccine. Patient has been advised to call insurance company to determine out of pocket expense if they have not yet received this vaccine. Advised may also receive vaccine at local pharmacy or Health Dept. Verbalized acceptance and understanding.  Screening Tests Health Maintenance  Topic Date Due   COVID-19 Vaccine (1) Never done   DTaP/Tdap/Td (1  - Tdap) Never done   Zoster Vaccines- Shingrix (1 of 2) Never done   INFLUENZA VACCINE  01/19/2023   Colonoscopy  02/14/2024 (Originally 04/22/2007)   Medicare Annual Wellness (AWV)  02/14/2024   MAMMOGRAM  08/17/2024   Hepatitis C Screening  Completed   HIV Screening  Completed   HPV VACCINES  Aged Out   PAP SMEAR-Modifier  Discontinued    Health Maintenance  Health Maintenance Due  Topic Date Due   COVID-19 Vaccine (1) Never done   DTaP/Tdap/Td (1 - Tdap) Never done   Zoster Vaccines- Shingrix (1 of 2) Never done   INFLUENZA VACCINE  01/19/2023    Colorectal cancer screening:  Patient would like to postpone   Mammogram status: Completed 08/17/22. Repeat every year  Lung Cancer Screening: (Low Dose CT Chest recommended if Age 65-80 years, 20 pack-year currently smoking OR have quit w/in 15years.) does not qualify.   Lung Cancer Screening Referral: n/a  Additional Screening:  Hepatitis C Screening: does qualify; Completed 07/03/18  Vision Screening: Recommended annual ophthalmology exams for early detection of glaucoma and other disorders of the eye. Is the patient up to date with their annual eye exam?  No  Who is the provider or what is the name of the office in which the patient attends annual eye exams? none If pt is not established with a provider, would they like to be referred to a provider to establish care? No .   Dental Screening: Recommended annual dental exams for proper oral hygiene  Community Resource Referral / Chronic Care Management: CRR required this visit?  No   CCM required this visit?  No     Plan:     I have personally reviewed and noted the following in the patient's chart:   Medical and social history Use of alcohol, tobacco or illicit drugs  Current medications and supplements including opioid prescriptions. Patient is not currently taking opioid prescriptions. Functional ability and status Nutritional status Physical activity Advanced  directives List of other physicians Hospitalizations, surgeries, and ER visits in previous 12 months Vitals Screenings to include cognitive, depression, and falls Referrals and appointments  In addition, I have reviewed and discussed with patient certain preventive protocols, quality metrics, and best practice recommendations. A written personalized care plan for preventive services as well as general preventive health recommendations were provided to patient.     Kandis Fantasia [place], [place]   1/61/0960   After Visit Summary: (MyChart) Due to this being a telephonic visit, the after visit summary with patients personalized plan was offered to patient via MyChart   Nurse Notes: No concerns at this time

## 2023-02-15 ENCOUNTER — Encounter: Payer: Self-pay | Admitting: Licensed Clinical Social Worker

## 2023-02-15 ENCOUNTER — Telehealth: Payer: Self-pay | Admitting: Family Medicine

## 2023-02-15 NOTE — Telephone Encounter (Signed)
Pt stated she has an appointment with Leavy Cella today that she cannot make because her kidneys are hurting mentioned she is using her heating pad. She stated she just wants to lay back down; it hurts when she walks, pt declined to speak with NT.   FYI to office.

## 2023-02-15 NOTE — Telephone Encounter (Signed)
Angela Gardner is aware that patient is unavailable. [name] said that she would follow-up with the patient.

## 2023-02-17 NOTE — Patient Outreach (Signed)
  Care Coordination   Collaboration  Visit Note   02/15/2023 Name: Angela Gardner MRN: [id number] DOB: March 21, 1961  Angela Gardner is a 62 y.o. year old female who sees [name], MD for primary care. I  spoke with [facility] RN  What matters to the patients health and wellness today?  Patient was not engaged by LCSW. Requested to r/s appt     SDOH assessments and interventions completed:  No     Care Coordination Interventions:  Yes, provided  Interventions Today    Flowsheet Row Most Recent Value  General Interventions   General Interventions Discussed/Reviewed Communication with  Communication with RN  [LCSW received incoming call from [facility] RN. Patient is not feeling well and requested to r/s care coordination appt]       Follow up plan:  LCSW will follow up with patient in 1-2 weeks    Encounter Outcome:  Pt. Visit Completed   [name], MSW, LCSW [facility] Care Management [facility] Health  [name] [email or web address] Phone [phone] 5:55 AM

## 2023-03-09 ENCOUNTER — Telehealth: Payer: Self-pay | Admitting: Licensed Clinical Social Worker

## 2023-03-09 NOTE — Patient Outreach (Signed)
Care Coordination   Follow Up Visit Note   03/07/2023 Name: Angela Gardner MRN: [id number] DOB: 11-Aug-1960  Angela Gardner is a 62 y.o. year old female who sees [name], MD for primary care. I spoke with  Angela Gardner by phone today.  What matters to the patients health and wellness today?  Housing and Symptom Management    Goals Addressed             This Visit's Progress    Obtaining Supportive Resources/Stress Management/Housin       Activities and task to complete in order to accomplish goals.   Keep all upcoming appointments discussed today Continue with compliance of taking medication prescribed by Doctor Implement healthy coping skills discussed to assist with management of symptoms Continue working with [facility] care team to assist with goals identified Obtain invoice on work needed on your home to provide to local agencies Implement strategies discussed to assist with decreasing soda intake          SDOH assessments and interventions completed:  No     Care Coordination Interventions:  Yes, provided  Interventions Today    Flowsheet Row Most Recent Value  Chronic Disease   Chronic disease during today's visit Hypertension (HTN), Chronic Kidney Disease/End Stage Renal Disease (ESRD), Congestive Heart Failure (CHF), Other  [Depression]  General Interventions   General Interventions Discussed/Reviewed General Interventions Reviewed, Doctor Visits, [employer] was successful in obtaining title for new trailer. Pt agreed to reach out to resources for estimates]  Doctor Visits Discussed/Reviewed Doctor Visits Reviewed  Mental Health Interventions   Mental Health Discussed/Reviewed Mental Health Reviewed, Coping Strategies, Anxiety, Depression     ' Follow up plan: Follow up call scheduled for 1-2 weeks    Encounter Outcome:  Patient Visit Completed   Angela Gardner, MSW, LCSW [facility] Care Management [facility] Health  [facility] [email or web address] Phone [phone] 12:50 PM

## 2023-03-09 NOTE — Patient Instructions (Signed)
Visit Information  Thank you for taking time to visit with me today. Please don't hesitate to contact me if I can be of assistance to you.   Following are the goals we discussed today:   Goals Addressed             This Visit's Progress    Obtaining Supportive Resources/Stress Management/Housin       Activities and task to complete in order to accomplish goals.   Keep all upcoming appointments discussed today Continue with compliance of taking medication prescribed by Doctor Implement healthy coping skills discussed to assist with management of symptoms Continue working with [facility] care team to assist with goals identified Obtain invoice on work needed on your home to provide to local agencies Implement strategies discussed to assist with decreasing soda intake          Our next appointment is by telephone on 10/01 at 1 PM  Please call the care guide team at [phone] if you need to cancel or reschedule your appointment.   If you are experiencing a Mental Health or Behavioral Health Crisis or need someone to talk to, please call the Suicide and Crisis Lifeline: 988 call 911   Patient verbalizes understanding of instructions and care plan provided today and agrees to view in MyChart. Active MyChart status and patient understanding of how to access instructions and care plan via MyChart confirmed with patient.     [name], MSW, LCSW [facility] Care Management [place]  [name] [email or web address] Phone [phone] 12:51 PM

## 2023-03-10 ENCOUNTER — Other Ambulatory Visit: Payer: Self-pay

## 2023-03-10 ENCOUNTER — Other Ambulatory Visit: Payer: Self-pay | Admitting: Family Medicine

## 2023-03-10 ENCOUNTER — Encounter: Payer: Self-pay | Admitting: Pharmacist

## 2023-03-10 DIAGNOSIS — I5032 Chronic diastolic (congestive) heart failure: Secondary | ICD-10-CM

## 2023-03-16 ENCOUNTER — Telehealth (INDEPENDENT_AMBULATORY_CARE_PROVIDER_SITE_OTHER): Payer: Self-pay | Admitting: Family Medicine

## 2023-03-16 NOTE — Telephone Encounter (Signed)
Copied from CRM [id number]. Topic: General - Other >> Mar 16, 2023  1:43 PM Ja-Kwan M wrote: Reason for CRM: Pt requests that Leavy Cella return her call at [phone]

## 2023-03-21 ENCOUNTER — Ambulatory Visit: Payer: Self-pay | Admitting: Licensed Clinical Social Worker

## 2023-03-22 NOTE — Patient Outreach (Signed)
Care Coordination   Follow Up Visit Note   03/21/2023 Name: Angela Gardner MRN: [id number] DOB: July 01, 1960  Angela Gardner is a 62 y.o. year old female who sees Angela Register, MD for primary care. I spoke with  Angela Gardner by phone today.  What matters to the patients health and wellness today?  Housing    Goals Addressed             This Visit's Progress    Obtaining Supportive Resources/Stress Management/Housin   On track    Activities and task to complete in order to accomplish goals.   Keep all upcoming appointments discussed today Continue with compliance of taking medication prescribed by Doctor Implement healthy coping skills discussed to assist with management of symptoms Continue working with [facility] care team to assist with goals identified Obtain invoice on work needed on your home to provide to local agencies Implement strategies discussed to assist with decreasing soda intake Obtain updated title to trailer          SDOH assessments and interventions completed:  No     Care Coordination Interventions:  Yes, provided  Interventions Today    Flowsheet Row Most Recent Value  Chronic Disease   Chronic disease during today's visit Hypertension (HTN), Chronic Kidney Disease/End Stage Renal Disease (ESRD), Congestive Heart Failure (CHF), Other  [Depression]  General Interventions   General Interventions Discussed/Reviewed General Interventions Reviewed, [facility], Doctor Visits  [Pt has obtained a quote from the handy man to assist with the floors ($650) Has to obtain a new title due to address change request. Discussed community agencies to assist]  Doctor Visits Discussed/Reviewed Doctor Visits Reviewed  Mental Health Interventions   Mental Health Discussed/Reviewed Mental Health Reviewed, Coping Strategies, Depression, Anxiety  [Patient continues to practice positive thinking to promote stress management. Has support from family and reports  management of symptoms]       Follow up plan: Follow up call scheduled for 4-6 weeks    Encounter Outcome:  Patient Visit Completed   Angela Gardner, MSW, LCSW [facility] Care Management [facility]  [name] [email or web address] Phone [phone] 3:41 PM

## 2023-03-22 NOTE — Patient Instructions (Signed)
Visit Information  Thank you for taking time to visit with me today. Please don't hesitate to contact me if I can be of assistance to you.   Following are the goals we discussed today:   Goals Addressed             This Visit's Progress    Obtaining Supportive Resources/Stress Management/Housin   On track    Activities and task to complete in order to accomplish goals.   Keep all upcoming appointments discussed today Continue with compliance of taking medication prescribed by Doctor Implement healthy coping skills discussed to assist with management of symptoms Continue working with [facility] care team to assist with goals identified Obtain invoice on work needed on your home to provide to local agencies Implement strategies discussed to assist with decreasing soda intake Obtain updated title to trailer          Our next appointment is by telephone on 11/12 at 11 AM  Please call the care guide team at [phone] if you need to cancel or reschedule your appointment.   If you are experiencing a Mental Health or Behavioral Health Crisis or need someone to talk to, please call the Suicide and Crisis Lifeline: 988 call 911   Patient verbalizes understanding of instructions and care plan provided today and agrees to view in MyChart. Active MyChart status and patient understanding of how to access instructions and care plan via MyChart confirmed with patient.     [name], MSW, LCSW [facility] Care Management [place]  [name] [email or web address] Phone [phone] 3:43 PM

## 2023-04-01 ENCOUNTER — Other Ambulatory Visit (HOSPITAL_COMMUNITY): Payer: Self-pay

## 2023-04-17 ENCOUNTER — Ambulatory Visit
Admission: RE | Admit: 2023-04-17 | Discharge: 2023-04-17 | Disposition: A | Discharge status: Home / Self Care | Payer: 59 | Source: Ambulatory Visit | Attending: Radiation Oncology | Admitting: Radiation Oncology

## 2023-04-17 ENCOUNTER — Other Ambulatory Visit: Payer: Self-pay

## 2023-04-17 ENCOUNTER — Other Ambulatory Visit (HOSPITAL_COMMUNITY): Payer: Self-pay

## 2023-04-17 ENCOUNTER — Telehealth: Payer: Self-pay | Admitting: *Deleted

## 2023-04-17 MED ORDER — LOSARTAN POTASSIUM 50 MG PO TABS
50.0000 mg | ORAL_TABLET | Freq: Every day | ORAL | 5 refills | Status: DC
  Filled 2023-04-17 (×2): qty 30, 30d supply, fill #0
  Filled 2023-05-15: qty 30, 30d supply, fill #1
  Filled 2023-06-14: qty 30, 30d supply, fill #2
  Filled 2023-07-10: qty 30, 30d supply, fill #3
  Filled 2023-08-07: qty 30, 30d supply, fill #4
  Filled 2023-09-06: qty 30, 30d supply, fill #5

## 2023-04-17 NOTE — Telephone Encounter (Signed)
CALLED PATIENT TO ASK ABOUT RESCHEDULING TODAY'S FU APPT, THAT SHE CANCELLED, SPOKE WITH PATIENT AND SHE AGREED TO COME ON 04-20-23 @ 10 AM, APPT. BOOKED

## 2023-04-20 ENCOUNTER — Ambulatory Visit
Admission: RE | Admit: 2023-04-20 | Discharge: 2023-04-20 | Disposition: A | Discharge status: Home / Self Care | Payer: 59 | Source: Ambulatory Visit | Attending: Radiation Oncology | Admitting: Radiation Oncology

## 2023-04-20 ENCOUNTER — Telehealth: Payer: Self-pay | Admitting: *Deleted

## 2023-04-20 ENCOUNTER — Telehealth: Payer: Self-pay | Admitting: Radiation Oncology

## 2023-04-20 NOTE — Telephone Encounter (Signed)
RETURNED PATIENT'S PHONE CALL, SPOKE WITH PATIENT, PATIENT CANCELLED TODAY'S APPT. DUE TO HALLOWEEN, PATIENT RESCHEDULED FOR 04-24-23 @ 11:30 AM

## 2023-04-20 NOTE — Telephone Encounter (Signed)
10/31 9:09 am Received call from patient to reschedule her FU30 appointment from today due to losing her friend.  Called and forward call to [facility], so they are aware.

## 2023-04-24 ENCOUNTER — Encounter (HOSPITAL_COMMUNITY): Payer: Self-pay

## 2023-04-24 ENCOUNTER — Ambulatory Visit
Admission: RE | Admit: 2023-04-24 | Discharge: 2023-04-24 | Disposition: A | Discharge status: Home / Self Care | Payer: 59 | Source: Ambulatory Visit | Attending: Radiation Oncology | Admitting: Radiation Oncology

## 2023-04-24 ENCOUNTER — Other Ambulatory Visit: Payer: Self-pay

## 2023-04-24 ENCOUNTER — Emergency Department (HOSPITAL_COMMUNITY)
Admission: EM | Admit: 2023-04-24 | Discharge: 2023-04-24 | Disposition: A | Discharge status: Home / Self Care | Payer: 59 | Attending: Emergency Medicine | Admitting: Emergency Medicine

## 2023-04-24 VITALS — BP 225/126 | HR 73 | Temp 97.5°F | Resp 18 | Ht 64.0 in | Wt 216.2 lb

## 2023-04-24 DIAGNOSIS — C541 Malignant neoplasm of endometrium: Secondary | ICD-10-CM

## 2023-04-24 DIAGNOSIS — I10 Essential (primary) hypertension: Secondary | ICD-10-CM | POA: Diagnosis not present

## 2023-04-24 DIAGNOSIS — Z79899 Other long term (current) drug therapy: Secondary | ICD-10-CM | POA: Diagnosis not present

## 2023-04-24 MED ORDER — HYDRALAZINE HCL 25 MG PO TABS
100.0000 mg | ORAL_TABLET | Freq: Once | ORAL | Status: AC
  Administered 2023-04-24: 100 mg via ORAL
  Filled 2023-04-24: qty 4

## 2023-04-24 MED ORDER — AMLODIPINE BESYLATE 5 MG PO TABS
10.0000 mg | ORAL_TABLET | Freq: Once | ORAL | Status: AC
  Administered 2023-04-24: 10 mg via ORAL
  Filled 2023-04-24: qty 2

## 2023-04-24 MED ORDER — LOSARTAN POTASSIUM 25 MG PO TABS
50.0000 mg | ORAL_TABLET | Freq: Once | ORAL | Status: AC
  Administered 2023-04-24: 50 mg via ORAL
  Filled 2023-04-24: qty 2

## 2023-04-24 NOTE — ED Notes (Signed)
Pt placed on cardiac monitoring. Pt resting comfortably on stretcher, call light within reach. Bed locked and in lowest position. RN @bedside . Will continue to monitor. Pt has no needs at this time.

## 2023-04-24 NOTE — ED Provider Notes (Signed)
[place] EMERGENCY DEPARTMENT AT [facility] Provider Note   CSN: [id number] Arrival date & time: 04/24/23  1118     History  Chief Complaint  Patient presents with   Hypertension    BP 250/148    Angela Gardner is a 62 y.o. female.  62 year old female with prior medical history as detailed below presents for evaluation.  Patient was presenting at the cancer center for routine follow-up.  Patient was noted to be hypertensive with systolics in the 220s.  Patient was referred to the ED for evaluation of elevated blood pressure.  Patient is laughing and comfortable on evaluation.  She denies any specific symptoms.  She freely admits that she has taken a medication holiday over the weekend.  She reports that she had her birthday on the second.  She reports that she had some beer and some cocaine in celebration of her birthday.  She reports that she typically will notice blood pressures at home up into the 180s to 220s systolic.  She reports that her blood pressure is mildly improved when she is compliant with her previously prescribed medications.  She specifically denies chest pain, headache, visual changes, weakness, nausea, vomiting, other complaint.  She reports that she has her routine medications at home.  She reports that she just did not take them today prior to coming to the clinic for evaluation.  She is agreeable with taking some of her blood pressure medications now in order to get her blood pressure down given that it is significantly elevated here in the ED.  The history is provided by the patient and medical records.       Home Medications Prior to Admission medications   Medication Sig Start Date End Date Taking? Authorizing Provider  amLODipine (NORVASC) 10 MG tablet Take 1 tablet (10 mg total) by mouth daily. Please schedule an appointment with Dr. Alvis Lemmings 11/16/22   [name], MD  atorvastatin (LIPITOR) 80 MG tablet Take 1 tablet (80 mg total) by  mouth daily. Please make an appointment with Dr. Alvis Lemmings. 02/08/23   [name], MD  buPROPion (WELLBUTRIN XL) 150 MG 24 hr tablet Take 1 tablet (150 mg total) by mouth daily for smoking cessation 03/07/22   [name], MD  FLUoxetine (PROZAC) 20 MG capsule Take 1 capsule (20 mg total) by mouth daily.Please make PCP appointment for more refills. 12/26/22   [name], MD  furosemide (LASIX) 40 MG tablet Take 1 tablet (40 mg total) by mouth 2 (two) times daily. Please schedule an appointment with Dr. Alvis Lemmings 11/16/22   [name], MD  hydrALAZINE (APRESOLINE) 100 MG tablet Take 1 tablet (100 mg total) by mouth 3 (three) times daily. Please schedule an appointment with Dr. Alvis Lemmings 11/16/22   [name], MD  losartan (COZAAR) 50 MG tablet Take 1 tablet (50 mg total) by mouth daily. 04/17/23     Misc. Devices MISC Bedside commode.  Diagnosis urinary incontinence 06/01/22   [name], MD  pantoprazole (PROTONIX) 40 MG tablet Take 1 tablet (40 mg total) by mouth daily. Please schedule an appointment with Dr. Alvis Lemmings 11/16/22   [name], MD  prochlorperazine (COMPAZINE) 10 MG tablet Take 1 tablet (10 mg total) by mouth every 6 (six) hours as needed (Nausea or vomiting). Patient not taking: Reported on 05/25/2018 12/08/17 05/29/18  Artis Delay, MD      Allergies    Vicodin [hydrocodone-acetaminophen]    Review of Systems   Review of Systems  All other systems reviewed  and are negative.   Physical Exam Updated Vital Signs BP (!) 226/125 (BP Location: Left Arm)   Pulse 70   Temp 97.8 F (36.6 C) (Oral)   Resp 15   Ht 5\' 4"  (1.626 m)   Wt 98 kg   LMP  (LMP Unknown) Comment: vaginal bleeding continuously for last 8 months  SpO2 100%   BMI 37.08 kg/m  Physical Exam Vitals and nursing note reviewed.  Constitutional:      General: She is not in acute distress.    Appearance: Normal appearance. She is well-developed.  HENT:     Head: Normocephalic and atraumatic.   Eyes:     Conjunctiva/sclera: Conjunctivae normal.     Pupils: Pupils are equal, round, and reactive to light.  Cardiovascular:     Rate and Rhythm: Normal rate and regular rhythm.     Heart sounds: Normal heart sounds.  Pulmonary:     Effort: Pulmonary effort is normal. No respiratory distress.     Breath sounds: Normal breath sounds.  Abdominal:     General: There is no distension.     Palpations: Abdomen is soft.     Tenderness: There is no abdominal tenderness.  Musculoskeletal:        General: No deformity. Normal range of motion.     Cervical back: Normal range of motion and neck supple.  Skin:    General: Skin is warm and dry.  Neurological:     General: No focal deficit present.     Mental Status: She is alert and oriented to person, place, and time.     ED Results / Procedures / Treatments   Labs (all labs ordered are listed, but only abnormal results are displayed) Labs Reviewed - No data to display  EKG None  Radiology No results found.  Procedures Procedures    Medications Ordered in ED Medications  hydrALAZINE (APRESOLINE) tablet 100 mg (has no administration in time range)  amLODipine (NORVASC) tablet 10 mg (has no administration in time range)  losartan (COZAAR) tablet 50 mg (has no administration in time range)    ED Course/ Medical Decision Making/ A&P                                 Medical Decision Making Risk Prescription drug management.    Medical Screen Complete  This patient presented to the ED with complaint of hypertension.  This complaint involves an extensive number of treatment options. The initial differential diagnosis includes, but is not limited to, medication noncompliance, chronic hypertension  This presentation is: Chronic, Self-Limited, Previously Undiagnosed, Uncertain Prognosis, Complicated, Systemic Symptoms, and Threat to Life/Bodily Function  Patient sent from cancer center for evaluation of high blood  pressure.  Patient is quite adamant about having no symptoms.  Patient is very forthcoming with recent noncompliance with previously prescribed antihypertensives.  Patient is agreeable with getting a dose of her antihypertensives here in the ED.  She otherwise refuses additional workup.  After administration of oral blood pressure medications her BP is mildly improved.  Patient reports that she does have her medications at home and that she will try to take them in the future.  Importance of close follow-up is stressed.  Strict return precautions given and understood.  Additional history obtained:  External records from outside sources obtained and reviewed including prior ED visits and prior Inpatient records.    Problem List / ED Course:  Chronic hypertension   Reevaluation:  After the interventions noted above, I reevaluated the patient and found that they have: stayed the same  Disposition:  After consideration of the diagnostic results and the patients response to treatment, I feel that the patent would benefit from close outpatient follow-up.          Final Clinical Impression(s) / ED Diagnoses Final diagnoses:  Hypertension, unspecified type    Rx / DC Orders ED Discharge Orders     None         Wynetta Fines, MD 04/24/23 1512

## 2023-04-24 NOTE — Progress Notes (Signed)
Keshana Klemz is here today for follow up post radiation to the pelvic.  Patient vitals were 225/126, 97.5, 73 BPM, 18. Vitals rechecked 10 mins later manually noted to be 250/148. Patient denies any chest pain, shortness of breath or other issues. Patient states she did not take her blood pressure medications this morning. MD made aware and advised patient to be seen in the ED. Called ED Charge nurse to make aware and was instructed to bring patient to room 9.

## 2023-04-24 NOTE — ED Triage Notes (Signed)
Per Cancer center RN  Hypertension Seen at Cancer center for appt High Blood pressure noted 250/148 225/126 Pt did not take BP medication this morning Last took BP meds was Nov 1

## 2023-04-24 NOTE — Discharge Instructions (Signed)
  Return for any problem.  Make sure that you take your medications as previously prescribed.  Your blood pressure will not be controlled if you do not take your antihypertensive medications.

## 2023-04-26 ENCOUNTER — Telehealth: Payer: Self-pay | Admitting: *Deleted

## 2023-04-26 NOTE — Telephone Encounter (Signed)
CALLED PATIENT TO ASK ABOUT RESCHEDULING FU APPT. FROM 04-24-23, SPOKE WITH PATIENT AND SHE HAS BEEN RESCHEDULED FOR 05-29-23 @ 10 AM

## 2023-05-02 ENCOUNTER — Ambulatory Visit: Payer: Self-pay | Admitting: Licensed Clinical Social Worker

## 2023-05-04 ENCOUNTER — Encounter: Payer: Self-pay | Admitting: Licensed Clinical Social Worker

## 2023-05-04 ENCOUNTER — Telehealth: Payer: Self-pay | Admitting: Licensed Clinical Social Worker

## 2023-05-04 NOTE — Patient Outreach (Signed)
  Care Coordination   05/04/2023 Name: Angela Gardner MRN: [id number] DOB: [birth year]   Care Coordination Outreach Attempts:  An unsuccessful telephone outreach was attempted for a scheduled appointment today.  Follow Up Plan:  Additional outreach attempts will be made to offer the patient care coordination information and services.   Encounter Outcome:  No Answer   Care Coordination Interventions:  No, not indicated    [name], MSW, LCSW [facility] Care Management [place]  [name] [email or web address] Phone [phone] 12:28 PM

## 2023-05-05 NOTE — Patient Outreach (Signed)
  Care Coordination   Follow Up Visit Note   05/02/2023 Name: Aylana Dalton MRN: [id number] DOB: 01/02/1961  Oshun Perrilloux is a 62 y.o. year old female who sees [name], MD for primary care. I spoke with  [name] by phone today.  What matters to the patients health and wellness today?  Patient requested to r/s appt     SDOH assessments and interventions completed:  No     Care Coordination Interventions:  Yes, provided  Interventions Today    Flowsheet Row Most Recent Value  Chronic Disease   Chronic disease during today's visit Hypertension (HTN), Congestive Heart Failure (CHF), Chronic Kidney Disease/End Stage Renal Disease (ESRD)  General Interventions   General Interventions Discussed/Reviewed General Interventions Reviewed       Follow up plan: Follow up call scheduled for 05/04/23    Encounter Outcome:  Patient Request to Call Back   [name], MSW, LCSW [facility] Care Management [facility]  [name] [email or web address] Phone [phone] 5:30 AM

## 2023-05-26 ENCOUNTER — Telehealth: Payer: Self-pay | Admitting: Licensed Clinical Social Worker

## 2023-05-26 NOTE — Patient Instructions (Signed)
Visit Information  Thank you for taking time to visit with me today. Please don't hesitate to contact me if I can be of assistance to you.   Following are the goals we discussed today:   Goals Addressed             This Visit's Progress    Obtaining Supportive Resources/Stress Management/Housin   On track    Activities and task to complete in order to accomplish goals.   Keep all upcoming appointments discussed today Continue with compliance of taking medication prescribed by Doctor Implement healthy coping skills discussed to assist with management of symptoms Continue working with [facility] care team to assist with goals identified Obtain invoice on work needed on your home to provide to local agencies Implement strategies discussed to assist with decreasing soda intake Obtain updated title to trailer Reach out to local agencies discussed regarding rent assistance          Our next appointment is by telephone on 12/20 at 1 PM  Please call the care guide team at [phone] if you need to cancel or reschedule your appointment.   If you are experiencing a Mental Health or Behavioral Health Crisis or need someone to talk to, please call the Suicide and Crisis Lifeline: 988 call 911   Patient verbalizes understanding of instructions and care plan provided today and agrees to view in MyChart. Active MyChart status and patient understanding of how to access instructions and care plan via MyChart confirmed with patient.     [name], MSW, LCSW [facility] Care Management [place]  [name] [email or web address] Phone [phone] 6:00 PM

## 2023-05-26 NOTE — Patient Outreach (Signed)
  Care Coordination   Follow Up Visit Note   05/26/2023 Name: Vaughn Dighton MRN: [id number] DOB: 11/06/1960  Alaetra Tiefenthaler is a 62 y.o. year old female who sees [name], MD for primary care. I spoke with  [name] by phone today.  What matters to the patients health and wellness today?  Symptom Management    Goals Addressed             This Visit's Progress    Obtaining Supportive Resources/Stress Management/Housin   On track    Activities and task to complete in order to accomplish goals.   Keep all upcoming appointments discussed today Continue with compliance of taking medication prescribed by Doctor Implement healthy coping skills discussed to assist with management of symptoms Continue working with [facility] care team to assist with goals identified Obtain invoice on work needed on your home to provide to local agencies Implement strategies discussed to assist with decreasing soda intake Obtain updated title to trailer Reach out to local agencies discussed regarding rent assistance          SDOH assessments and interventions completed:  No     Care Coordination Interventions:  Yes, provided   Follow up plan: Follow up call scheduled for 2-4 weeks    Encounter Outcome:  Patient Visit Completed   [name], MSW, LCSW [facility] Care Management [facility]  [name] [email or web address] Phone [phone] 5:59 PM

## 2023-05-27 NOTE — Progress Notes (Incomplete)
Radiation Oncology         [phone] ________________________________  Name: Angela Gardner MRN: [id number]  Date: 05/29/2023  DOB: 1961-03-30  Follow-Up Visit Note  CC: [name], MD  Adolphus Birchwood, MD  No diagnosis found.  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 She was last seen here for follow-up on 04/12/23 as she has missed her last several follow-up appointments.   Since her last visit, the patient followed up with Dr. Tamela Oddi on 11/23/22 and 01/11/23. During both visits, she denied any symptoms concerning for disease recurrence and she was noted as NED on examination. She was however to have have high BP during her visit in July.   With regards to her hypertension, she recently presented to the ED on 04/24/23 due to systolic readings in the 220 range. She did however report recent cocaine and alcohol consumption several days before this encounter. She also admitted to being noncompliant with her antihypertensive medications. ED course consisted of a dose of her BP medication and she was counseled on the importance of taking her prescribed antihypertensives.   Pertinent imaging performed in the interval includes a routine bilateral screening mammogram on 08/17/22 which showed no evidence of malignancy in either breast. She also presented for a renal US on 11/21/22 (in the setting of stage IV CKD) which showed no significant renal cortical thickening, and an indeterminate 1.0 cm hypoechoic structure in the lower pole of the right kidney             ***                   Allergies:  is allergic to vicodin [hydrocodone-acetaminophen].  Meds: Current Outpatient Medications   Medication Sig Dispense Refill   amLODipine (NORVASC) 10 MG tablet Take 1 tablet (10 mg total) by mouth daily. Please schedule an appointment with Dr. Alvis Lemmings 30 tablet 0   atorvastatin (LIPITOR) 80 MG tablet Take 1 tablet (80 mg total) by mouth daily. Please make an appointment with Dr. Alvis Lemmings. 30 tablet 0   buPROPion (WELLBUTRIN XL) 150 MG 24 hr tablet Take 1 tablet (150 mg total) by mouth daily for smoking cessation 30 tablet 3   FLUoxetine (PROZAC) 20 MG capsule Take 1 capsule (20 mg total) by mouth daily.Please make PCP appointment for more refills. 30 capsule 0   furosemide (LASIX) 40 MG tablet Take 1 tablet (40 mg total) by mouth 2 (two) times daily. Please schedule an appointment with Dr. Alvis Lemmings 60 tablet 0   hydrALAZINE (APRESOLINE) 100 MG tablet Take 1 tablet (100 mg total) by mouth 3 (three) times daily. Please schedule an appointment with Dr. Alvis Lemmings 90 tablet 0   losartan (COZAAR) 50 MG tablet Take 1 tablet (50 mg total) by mouth daily. 30 tablet 5   Misc. Devices MISC Bedside commode.  Diagnosis urinary incontinence 1 each 0   pantoprazole (PROTONIX) 40 MG tablet Take 1 tablet (40 mg total) by mouth daily. Please schedule an appointment with Dr. Alvis Lemmings 30 tablet 0   No current facility-administered medications for this encounter.    Physical Findings: The patient is in no acute distress. Patient is alert and oriented.  vitals were not  taken for this visit. .  No significant changes. Lungs are clear to auscultation bilaterally. Heart has regular rate and rhythm. No palpable cervical, supraclavicular, or axillary adenopathy. Abdomen soft, non-tender, normal bowel sounds.  On pelvic examination the external genitalia were unremarkable. A speculum exam was performed. There are no mucosal lesions noted in the vaginal vault. A Pap smear was obtained of the proximal vagina. On bimanual and rectovaginal examination there were no pelvic masses appreciated. ***   Lab Findings: Lab Results   Component Value Date   WBC 5.5 04/23/2022   HGB 12.4 04/23/2022   HCT 39.9 04/23/2022   MCV 92.6 04/23/2022   PLT 223 04/23/2022    Radiographic Findings: No results found.  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  ***  Plan:  ***   *** minutes of total time was spent for this patient encounter, including preparation, face-to-face counseling with the patient and coordination of care, physical exam, and documentation of the encounter. ____________________________________  Billie Lade, PhD, MD  This document serves as a record of services personally performed by Antony Blackbird, MD. It was created on his behalf by Neena Rhymes, a trained medical scribe. The creation of this record is based on the scribe's personal observations and the provider's statements to them. This document has been checked and approved by the attending provider.

## 2023-05-29 ENCOUNTER — Ambulatory Visit
Admission: RE | Admit: 2023-05-29 | Discharge: 2023-05-29 | Disposition: A | Discharge status: Home / Self Care | Payer: 59 | Source: Ambulatory Visit | Attending: Radiation Oncology | Admitting: Radiation Oncology

## 2023-05-29 ENCOUNTER — Telehealth: Payer: Self-pay | Admitting: *Deleted

## 2023-05-29 NOTE — Telephone Encounter (Signed)
Returned patient's phone call, vm full unable to leave message

## 2023-05-29 NOTE — Telephone Encounter (Signed)
CALLED PATIENT TO ASK ABOUT RESCHEDULING MISSED FU, UN ABLE TO LEAVE MESSAGE DUE TO VM BEING FULL

## 2023-05-30 ENCOUNTER — Telehealth: Payer: Self-pay | Admitting: *Deleted

## 2023-05-30 NOTE — Telephone Encounter (Signed)
Returned patient's phone call, unable to leave message due to vm being full

## 2023-06-09 ENCOUNTER — Encounter: Payer: Self-pay | Admitting: Licensed Clinical Social Worker

## 2023-06-09 ENCOUNTER — Telehealth: Payer: Self-pay | Admitting: Licensed Clinical Social Worker

## 2023-06-09 NOTE — Patient Outreach (Signed)
  Care Coordination   06/09/2023 Name: Angela Gardner MRN: [id number] DOB: 1960-10-04   Care Coordination Outreach Attempts:  An unsuccessful outreach was attempted for an appointment today.  Follow Up Plan:  Additional outreach attempts will be made to offer the patient complex care management information and services.   Encounter Outcome:  No Answer   Care Coordination Interventions:  No, not indicated    [name], MSW, LCSW [facility] Care Management [place]  [name] [email or web address] Phone [phone] 5:18 PM

## 2023-06-15 ENCOUNTER — Other Ambulatory Visit: Payer: Self-pay | Admitting: Nephrology

## 2023-06-15 DIAGNOSIS — N184 Chronic kidney disease, stage 4 (severe): Secondary | ICD-10-CM

## 2023-06-22 ENCOUNTER — Ambulatory Visit
Admission: RE | Admit: 2023-06-22 | Discharge: 2023-06-22 | Disposition: A | Discharge status: Home / Self Care | Payer: 59 | Source: Ambulatory Visit | Attending: Radiation Oncology | Admitting: Radiation Oncology

## 2023-07-07 ENCOUNTER — Telehealth: Payer: Self-pay | Admitting: *Deleted

## 2023-07-07 ENCOUNTER — Telehealth: Payer: Self-pay | Admitting: Radiation Oncology

## 2023-07-07 NOTE — Telephone Encounter (Signed)
RETURNED PATIENT'S PHONE CALL, SPOKE WITH PATIENT. ?

## 2023-07-07 NOTE — Telephone Encounter (Signed)
1/17 @ 8:03 am Patient left voicemail to be schedule for her missed appointment.  Email forward to [facility], so they are aware.

## 2023-07-23 NOTE — Progress Notes (Signed)
Radiation Oncology         [phone] ________________________________  Name: Angela Gardner MRN: [id number]  Date: 07/24/2023  DOB: 1960/08/02  Follow-Up Visit Note  CC: [name], MD  Adolphus Birchwood, MD  No diagnosis found.  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 She was last seen here for follow-up on 04/12/23 as she has missed her last several follow-up appointments.   Since her last visit, the patient followed up with Dr. Tamela Oddi on 11/23/22 and 01/11/23. During both visits, she denied any symptoms concerning for disease recurrence and she was noted as NED on examination. She was however to have have high BP during her visit in July.   With regards to her hypertension, she recently presented to the ED on 04/24/23 due to systolic readings in the 220 range. She did however report recent cocaine and alcohol consumption several days before this encounter. She also admitted to being noncompliant with her antihypertensive medications. ED course consisted of a dose of her BP medication and she was counseled on the importance of taking her prescribed antihypertensives.   Pertinent imaging performed in the interval includes a routine bilateral screening mammogram on 08/17/22 which showed no evidence of malignancy in either breast. She also presented for a renal US on 11/21/22 (in the setting of stage IV CKD) which showed no significant renal cortical thickening, and an indeterminate 1.0 cm hypoechoic structure in the lower pole of the right kidney             ***                   Allergies:  is allergic to vicodin [hydrocodone-acetaminophen].  Meds: Current Outpatient Medications   Medication Sig Dispense Refill   amLODipine (NORVASC) 10 MG tablet Take 1 tablet (10 mg total) by mouth daily. Please schedule an appointment with Dr. Alvis Lemmings 30 tablet 0   atorvastatin (LIPITOR) 80 MG tablet Take 1 tablet (80 mg total) by mouth daily. Please make an appointment with Dr. Alvis Lemmings. 30 tablet 0   buPROPion (WELLBUTRIN XL) 150 MG 24 hr tablet Take 1 tablet (150 mg total) by mouth daily for smoking cessation 30 tablet 3   FLUoxetine (PROZAC) 20 MG capsule Take 1 capsule (20 mg total) by mouth daily.Please make PCP appointment for more refills. 30 capsule 0   furosemide (LASIX) 40 MG tablet Take 1 tablet (40 mg total) by mouth 2 (two) times daily. Please schedule an appointment with Dr. Alvis Lemmings 60 tablet 0   hydrALAZINE (APRESOLINE) 100 MG tablet Take 1 tablet (100 mg total) by mouth 3 (three) times daily. Please schedule an appointment with Dr. Alvis Lemmings 90 tablet 0   losartan (COZAAR) 50 MG tablet Take 1 tablet (50 mg total) by mouth daily. 30 tablet 5   Misc. Devices MISC Bedside commode.  Diagnosis urinary incontinence 1 each 0   pantoprazole (PROTONIX) 40 MG tablet Take 1 tablet (40 mg total) by mouth daily. Please schedule an appointment with Dr. Alvis Lemmings 30 tablet 0   No current facility-administered medications for this encounter.    Physical Findings: The patient is in no acute distress. Patient is alert and oriented.  vitals were not taken for  this visit. .  No significant changes. Lungs are clear to auscultation bilaterally. Heart has regular rate and rhythm. No palpable cervical, supraclavicular, or axillary adenopathy. Abdomen soft, non-tender, normal bowel sounds.  On pelvic examination the external genitalia were unremarkable. A speculum exam was performed. There are no mucosal lesions noted in the vaginal vault. A Pap smear was obtained of the proximal vagina. On bimanual and rectovaginal examination there were no pelvic masses appreciated. ***   Lab Findings: Lab Results   Component Value Date   WBC 5.5 04/23/2022   HGB 12.4 04/23/2022   HCT 39.9 04/23/2022   MCV 92.6 04/23/2022   PLT 223 04/23/2022    Radiographic Findings: No results found.  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  ***  Plan:  ***   *** minutes of total time was spent for this patient encounter, including preparation, face-to-face counseling with the patient and coordination of care, physical exam, and documentation of the encounter. ____________________________________   Joyice Faster, PA-C  This document serves as a record of services personally performed by Antony Blackbird, MD and Bryan Lemma, PA-C. It was created on his behalf by Neena Rhymes, a trained medical scribe. The creation of this record is based on the scribe's personal observations and the provider's statements to them. This document has been checked and approved by the attending provider.

## 2023-07-24 ENCOUNTER — Ambulatory Visit
Admission: RE | Admit: 2023-07-24 | Discharge: 2023-07-24 | Disposition: A | Discharge status: Home / Self Care | Payer: 59 | Source: Ambulatory Visit | Attending: Radiation Oncology | Admitting: Radiation Oncology

## 2023-07-24 ENCOUNTER — Encounter: Payer: Self-pay | Admitting: Radiation Oncology

## 2023-07-24 ENCOUNTER — Other Ambulatory Visit: Payer: Self-pay

## 2023-07-24 VITALS — BP 186/104 | HR 82 | Temp 97.4°F | Resp 24 | Ht 64.0 in | Wt 214.8 lb

## 2023-07-24 DIAGNOSIS — C541 Malignant neoplasm of endometrium: Secondary | ICD-10-CM | POA: Insufficient documentation

## 2023-07-24 DIAGNOSIS — C801 Malignant (primary) neoplasm, unspecified: Secondary | ICD-10-CM | POA: Diagnosis not present

## 2023-07-24 MED ORDER — FLUCONAZOLE 150 MG PO TABS: 150.0000 mg | ORAL_TABLET | Freq: Every day | ORAL | 0 refills | Status: DC

## 2023-07-24 NOTE — Progress Notes (Addendum)
Angela Gardner is here today for follow up post radiation to the pelvic.  They completed their radiation on: 02-21-2018  Does the patient complain of any of the following:  Pain: Reports pain intermittently in pelvic area. Abdominal bloating: Denies Diarrhea/Constipation: Denies Nausea/Vomiting: Denies Vaginal Discharge: Yes, with odor Blood in Urine or Stool: Denies Urinary Issues (dysuria/incomplete emptying/ incontinence/ increased frequency/urgency): Denies Does patient report using vaginal dilator 2-3 times a week and/or sexually active 2-3 weeks: She reports not using dilators. Post radiation skin changes: Yes   Additional questions or concerns: Patient reports a breakout in vagina area  BP (!) 186/104 (BP Location: Right Arm, Patient Position: Sitting, Cuff Size: Large)   Pulse 82   Temp (!) 97.4 F (36.3 C)   Resp (!) 24   Ht 5\' 4"  (1.626 m)   Wt 214 lb 12.8 oz (97.4 kg)   LMP  (LMP Unknown) Comment: vaginal bleeding continuously for last 8 months  SpO2 100%   BMI 36.87 kg/m

## 2023-07-27 ENCOUNTER — Encounter (HOSPITAL_COMMUNITY): Payer: Self-pay | Admitting: *Deleted

## 2023-07-27 ENCOUNTER — Emergency Department (HOSPITAL_COMMUNITY): Payer: 59

## 2023-07-27 ENCOUNTER — Inpatient Hospital Stay (HOSPITAL_COMMUNITY)
Admission: EM | Admit: 2023-07-27 | Discharge: 2023-07-31 | DRG: 193 | Disposition: A | Discharge status: Home Health | Payer: 59 | Attending: Obstetrics and Gynecology | Admitting: Obstetrics and Gynecology

## 2023-07-27 ENCOUNTER — Other Ambulatory Visit: Payer: Self-pay

## 2023-07-27 DIAGNOSIS — Z9049 Acquired absence of other specified parts of digestive tract: Secondary | ICD-10-CM | POA: Diagnosis not present

## 2023-07-27 DIAGNOSIS — Z90710 Acquired absence of both cervix and uterus: Secondary | ICD-10-CM

## 2023-07-27 DIAGNOSIS — R7989 Other specified abnormal findings of blood chemistry: Secondary | ICD-10-CM | POA: Diagnosis not present

## 2023-07-27 DIAGNOSIS — E785 Hyperlipidemia, unspecified: Secondary | ICD-10-CM | POA: Diagnosis present

## 2023-07-27 DIAGNOSIS — I130 Hypertensive heart and chronic kidney disease with heart failure and stage 1 through stage 4 chronic kidney disease, or unspecified chronic kidney disease: Secondary | ICD-10-CM | POA: Diagnosis present

## 2023-07-27 DIAGNOSIS — J9601 Acute respiratory failure with hypoxia: Secondary | ICD-10-CM | POA: Diagnosis not present

## 2023-07-27 DIAGNOSIS — J101 Influenza due to other identified influenza virus with other respiratory manifestations: Principal | ICD-10-CM | POA: Diagnosis present

## 2023-07-27 DIAGNOSIS — J441 Chronic obstructive pulmonary disease with (acute) exacerbation: Secondary | ICD-10-CM | POA: Diagnosis not present

## 2023-07-27 DIAGNOSIS — Z720 Tobacco use: Secondary | ICD-10-CM

## 2023-07-27 DIAGNOSIS — N390 Urinary tract infection, site not specified: Secondary | ICD-10-CM | POA: Diagnosis not present

## 2023-07-27 DIAGNOSIS — Z87891 Personal history of nicotine dependence: Secondary | ICD-10-CM

## 2023-07-27 DIAGNOSIS — F32A Depression, unspecified: Secondary | ICD-10-CM | POA: Diagnosis present

## 2023-07-27 DIAGNOSIS — R1030 Lower abdominal pain, unspecified: Secondary | ICD-10-CM | POA: Diagnosis present

## 2023-07-27 DIAGNOSIS — Z1152 Encounter for screening for COVID-19: Secondary | ICD-10-CM | POA: Diagnosis not present

## 2023-07-27 DIAGNOSIS — Z9079 Acquired absence of other genital organ(s): Secondary | ICD-10-CM

## 2023-07-27 DIAGNOSIS — R778 Other specified abnormalities of plasma proteins: Secondary | ICD-10-CM | POA: Diagnosis not present

## 2023-07-27 DIAGNOSIS — E860 Dehydration: Secondary | ICD-10-CM | POA: Diagnosis present

## 2023-07-27 DIAGNOSIS — E1122 Type 2 diabetes mellitus with diabetic chronic kidney disease: Secondary | ICD-10-CM | POA: Diagnosis not present

## 2023-07-27 DIAGNOSIS — Z90722 Acquired absence of ovaries, bilateral: Secondary | ICD-10-CM

## 2023-07-27 DIAGNOSIS — R319 Hematuria, unspecified: Secondary | ICD-10-CM | POA: Diagnosis present

## 2023-07-27 DIAGNOSIS — Z923 Personal history of irradiation: Secondary | ICD-10-CM

## 2023-07-27 DIAGNOSIS — N183 Chronic kidney disease, stage 3 unspecified: Secondary | ICD-10-CM | POA: Diagnosis present

## 2023-07-27 DIAGNOSIS — Z8659 Personal history of other mental and behavioral disorders: Secondary | ICD-10-CM

## 2023-07-27 DIAGNOSIS — E782 Mixed hyperlipidemia: Secondary | ICD-10-CM | POA: Diagnosis not present

## 2023-07-27 DIAGNOSIS — C801 Malignant (primary) neoplasm, unspecified: Secondary | ICD-10-CM

## 2023-07-27 DIAGNOSIS — N179 Acute kidney failure, unspecified: Secondary | ICD-10-CM | POA: Diagnosis not present

## 2023-07-27 DIAGNOSIS — I509 Heart failure, unspecified: Secondary | ICD-10-CM

## 2023-07-27 DIAGNOSIS — R197 Diarrhea, unspecified: Secondary | ICD-10-CM | POA: Diagnosis present

## 2023-07-27 DIAGNOSIS — Z5986 Financial insecurity: Secondary | ICD-10-CM

## 2023-07-27 DIAGNOSIS — R32 Unspecified urinary incontinence: Secondary | ICD-10-CM | POA: Diagnosis present

## 2023-07-27 DIAGNOSIS — I5032 Chronic diastolic (congestive) heart failure: Secondary | ICD-10-CM | POA: Diagnosis present

## 2023-07-27 DIAGNOSIS — Z8249 Family history of ischemic heart disease and other diseases of the circulatory system: Secondary | ICD-10-CM | POA: Diagnosis not present

## 2023-07-27 DIAGNOSIS — Z79899 Other long term (current) drug therapy: Secondary | ICD-10-CM

## 2023-07-27 DIAGNOSIS — F1410 Cocaine abuse, uncomplicated: Secondary | ICD-10-CM | POA: Diagnosis present

## 2023-07-27 DIAGNOSIS — R059 Cough, unspecified: Secondary | ICD-10-CM | POA: Diagnosis not present

## 2023-07-27 DIAGNOSIS — R079 Chest pain, unspecified: Secondary | ICD-10-CM | POA: Diagnosis not present

## 2023-07-27 DIAGNOSIS — C541 Malignant neoplasm of endometrium: Secondary | ICD-10-CM | POA: Diagnosis present

## 2023-07-27 DIAGNOSIS — J111 Influenza due to unidentified influenza virus with other respiratory manifestations: Secondary | ICD-10-CM

## 2023-07-27 LAB — CBC WITH DIFFERENTIAL/PLATELET
Abs Immature Granulocytes: 0.02 10*3/uL (ref 0.00–0.07)
Basophils Absolute: 0 10*3/uL (ref 0.0–0.1)
Basophils Relative: 0 %
Eosinophils Absolute: 0 10*3/uL (ref 0.0–0.5)
Eosinophils Relative: 0 %
HCT: 46.4 % — ABNORMAL HIGH (ref 36.0–46.0)
Hemoglobin: 14.1 g/dL (ref 12.0–15.0)
Immature Granulocytes: 1 %
Lymphocytes Relative: 27 %
Lymphs Abs: 1 10*3/uL (ref 0.7–4.0)
MCH: 28.4 pg (ref 26.0–34.0)
MCHC: 30.4 g/dL (ref 30.0–36.0)
MCV: 93.4 fL (ref 80.0–100.0)
Monocytes Absolute: 0.3 10*3/uL (ref 0.1–1.0)
Monocytes Relative: 9 %
Neutro Abs: 2.4 10*3/uL (ref 1.7–7.7)
Neutrophils Relative %: 63 %
Platelets: 178 10*3/uL (ref 150–400)
RBC: 4.97 MIL/uL (ref 3.87–5.11)
RDW: 15.5 % (ref 11.5–15.5)
WBC: 3.8 10*3/uL — ABNORMAL LOW (ref 4.0–10.5)
nRBC: 0 % (ref 0.0–0.2)

## 2023-07-27 LAB — COMPREHENSIVE METABOLIC PANEL
ALT: 12 U/L (ref 0–44)
AST: 23 U/L (ref 15–41)
Albumin: 3.5 g/dL (ref 3.5–5.0)
Alkaline Phosphatase: 81 U/L (ref 38–126)
Anion gap: 12 (ref 5–15)
BUN: 25 mg/dL — ABNORMAL HIGH (ref 8–23)
CO2: 22 mmol/L (ref 22–32)
Calcium: 8.8 mg/dL — ABNORMAL LOW (ref 8.9–10.3)
Chloride: 105 mmol/L (ref 98–111)
Creatinine, Ser: 1.93 mg/dL — ABNORMAL HIGH (ref 0.44–1.00)
GFR, Estimated: 29 mL/min — ABNORMAL LOW (ref >60)
Glucose, Bld: 104 mg/dL — ABNORMAL HIGH (ref 70–99)
Potassium: 3.7 mmol/L (ref 3.5–5.1)
Sodium: 139 mmol/L (ref 135–145)
Total Bilirubin: 0.7 mg/dL (ref 0.0–1.2)
Total Protein: 7.2 g/dL (ref 6.5–8.1)

## 2023-07-27 LAB — RESP PANEL BY RT-PCR (RSV, FLU A&B, COVID)  RVPGX2
Influenza A by PCR: POSITIVE — AB
Influenza B by PCR: NEGATIVE
Resp Syncytial Virus by PCR: NEGATIVE
SARS Coronavirus 2 by RT PCR: NEGATIVE

## 2023-07-27 LAB — TROPONIN I (HIGH SENSITIVITY)
Troponin I (High Sensitivity): 54 ng/L — ABNORMAL HIGH (ref <18)
Troponin I (High Sensitivity): 59 ng/L — ABNORMAL HIGH (ref <18)

## 2023-07-27 MED ORDER — SODIUM CHLORIDE 0.9 % IV BOLUS
500.0000 mL | Freq: Once | INTRAVENOUS | Status: AC
  Administered 2023-07-27: 500 mL via INTRAVENOUS

## 2023-07-27 MED ORDER — METHYLPREDNISOLONE SODIUM SUCC 125 MG IJ SOLR
125.0000 mg | Freq: Once | INTRAMUSCULAR | Status: AC
  Administered 2023-07-27: 125 mg via INTRAVENOUS
  Filled 2023-07-27: qty 2

## 2023-07-27 MED ORDER — ACETAMINOPHEN 500 MG PO TABS
1000.0000 mg | ORAL_TABLET | Freq: Once | ORAL | Status: AC
  Administered 2023-07-27: 1000 mg via ORAL
  Filled 2023-07-27: qty 2

## 2023-07-27 MED ORDER — IPRATROPIUM-ALBUTEROL 0.5-2.5 (3) MG/3ML IN SOLN
3.0000 mL | Freq: Once | RESPIRATORY_TRACT | Status: AC
  Administered 2023-07-27: 3 mL via RESPIRATORY_TRACT
  Filled 2023-07-27: qty 3

## 2023-07-27 MED ORDER — MAGNESIUM SULFATE 2 GM/50ML IV SOLN
2.0000 g | Freq: Once | INTRAVENOUS | Status: AC
  Administered 2023-07-27: 2 g via INTRAVENOUS
  Filled 2023-07-27: qty 50

## 2023-07-27 NOTE — ED Provider Triage Note (Signed)
 Emergency Medicine Provider Triage Evaluation Note  Angela Gardner , a 63 y.o. female  was evaluated in triage.  Pt complains of constant chest pain accompanied with cough, congestion, fever Tmax 101.0. Saw PCP on 07/24/2023, Dx w/ yeast infection, Tx w/ fluconazole . After appointment, she states cough, congestion.   Endorses headache, shortness of breath, n/v/d, generalized abdominal pain Denies dysuria, hematochezia, melena, LE swelling, LE pain  Review of Systems  Positive: N/a Negative: N/a  Physical Exam  BP (!) 183/121   Pulse 79   Temp 99.8 F (37.7 C)   Resp (!) 22   Ht 5' 3 (1.6 m)   Wt 97.4 kg   LMP  (LMP Unknown) Comment: vaginal bleeding continuously for last 8 months  SpO2 96%   BMI 38.05 kg/m  Gen:   Awake, no distress   Resp:  Normal effort  MSK:   Moves extremities without difficulty  Other:    Medical Decision Making  Medically screening exam initiated at 2:06 PM.  Appropriate orders placed.  Angela Gardner was informed that the remainder of the evaluation will be completed by another provider, this initial triage assessment does not replace that evaluation, and the importance of remaining in the ED until their evaluation is complete.     Beola Terrall RAMAN, [place] 07/27/23 [id number]

## 2023-07-27 NOTE — ED Provider Notes (Signed)
 Angela Gardner Provider Note   CSN: [id number] Arrival date & time: 07/27/23  1338     History  Chief Complaint  Patient presents with   Chest Pain    Angela Gardner is a 63 y.o. female with a past medical history of tobacco abuse, endometrial cancer in remission status post radiation treatment, chronic kidney disease, CHF who presents emergency department with complaint of flulike symptoms and shortness of breath.  Patient had onset of cough, body aches, chills starting 5 days ago.  She states she has had associated diarrhea and nausea without vomiting.  She has had progressively worsening cough wheezing and shortness of breath.  Patient thought she might be having a CHF exacerbation but she denies any significant swelling in her lower extremities.  Patient denies chest pain except for with coughing.   Chest Pain      Home Medications Prior to Admission medications   Medication Sig Start Date End Date Taking? Authorizing Provider  amLODipine  (NORVASC ) 10 MG tablet Take 1 tablet (10 mg total) by mouth daily. Please schedule an appointment with Dr. Newlin 11/16/22   Newlin, Enobong, MD  atorvastatin  (LIPITOR) 80 MG tablet Take 1 tablet (80 mg total) by mouth daily. Please make an appointment with Dr. Newlin. 02/08/23   Newlin, Enobong, MD  buPROPion  (WELLBUTRIN  XL) 150 MG 24 hr tablet Take 1 tablet (150 mg total) by mouth daily for smoking cessation 03/07/22   Newlin, Enobong, MD  fluconazole  (DIFLUCAN ) 150 MG tablet Take 1 tablet (150 mg total) by mouth daily. Take second tablet 72 hours later 07/24/23   Shannon Agent, MD  FLUoxetine  (PROZAC ) 20 MG capsule Take 1 capsule (20 mg total) by mouth daily.Please make PCP appointment for more refills. 12/26/22   Newlin, Enobong, MD  furosemide  (LASIX ) 40 MG tablet Take 1 tablet (40 mg total) by mouth 2 (two) times daily. Please schedule an appointment with Dr. Newlin 11/16/22   Newlin, Enobong, MD   hydrALAZINE  (APRESOLINE ) 100 MG tablet Take 1 tablet (100 mg total) by mouth 3 (three) times daily. Please schedule an appointment with Dr. Newlin 11/16/22   Newlin, Enobong, MD  losartan  (COZAAR ) 50 MG tablet Take 1 tablet (50 mg total) by mouth daily. 04/17/23     Misc. Devices MISC Bedside commode.  Diagnosis urinary incontinence 06/01/22   Delbert Clam, MD  pantoprazole  (PROTONIX ) 40 MG tablet Take 1 tablet (40 mg total) by mouth daily. Please schedule an appointment with Dr. Newlin 11/16/22   Newlin, Enobong, MD  prochlorperazine  (COMPAZINE ) 10 MG tablet Take 1 tablet (10 mg total) by mouth every 6 (six) hours as needed (Nausea or vomiting). Patient not taking: Reported on 05/25/2018 12/08/17 05/29/18  Angela Hicks, MD      Allergies    Vicodin [hydrocodone-acetaminophen ]    Review of Systems   Review of Systems  Cardiovascular:  Positive for chest pain.    Physical Exam Updated Vital Signs BP (!) 172/94   Pulse 74   Temp 98.9 F (37.2 C) (Oral)   Resp 16   Ht 5' 3 (1.6 m)   Wt 97.4 kg   LMP  (LMP Unknown) Comment: vaginal bleeding continuously for last 8 months  SpO2 94%   BMI 38.05 kg/m  Physical Exam Vitals and nursing note reviewed.  Constitutional:      General: She is not in acute distress.    Appearance: She is well-developed. She is not diaphoretic.  HENT:     Head:  Normocephalic and atraumatic.     Right Ear: External ear normal.     Left Ear: External ear normal.     Nose: Nose normal.     Mouth/Throat:     Mouth: Mucous membranes are moist.  Eyes:     General: No scleral icterus.    Conjunctiva/sclera: Conjunctivae normal.  Cardiovascular:     Rate and Rhythm: Normal rate and regular rhythm.     Heart sounds: Normal heart sounds. No murmur heard.    No friction rub. No gallop.  Pulmonary:     Effort: Pulmonary effort is normal. Tachypnea present. No respiratory distress.     Breath sounds: Examination of the right-upper field reveals wheezing.  Examination of the left-upper field reveals wheezing. Examination of the right-middle field reveals decreased breath sounds. Examination of the left-middle field reveals wheezing. Examination of the right-lower field reveals decreased breath sounds. Examination of the left-lower field reveals wheezing. Decreased breath sounds and wheezing present.  Abdominal:     General: Bowel sounds are normal. There is no distension.     Palpations: Abdomen is soft. There is no mass.     Tenderness: There is no abdominal tenderness. There is no guarding.  Musculoskeletal:     Cervical back: Normal range of motion.     Right lower leg: No edema.     Left lower leg: No edema.  Skin:    General: Skin is warm and dry.  Neurological:     Mental Status: She is alert and oriented to person, place, and time.  Psychiatric:        Behavior: Behavior normal.     ED Results / Procedures / Treatments   Labs (all labs ordered are listed, but only abnormal results are displayed) Labs Reviewed  RESP PANEL BY RT-PCR (RSV, FLU A&B, COVID)  RVPGX2 - Abnormal; Notable for the following components:      Result Value   Influenza A by PCR POSITIVE (*)    All other components within normal limits  CBC WITH DIFFERENTIAL/PLATELET - Abnormal; Notable for the following components:   WBC 3.8 (*)    HCT 46.4 (*)    All other components within normal limits  COMPREHENSIVE METABOLIC PANEL - Abnormal; Notable for the following components:   Glucose, Bld 104 (*)    BUN 25 (*)    Creatinine, Ser 1.93 (*)    Calcium  8.8 (*)    GFR, Estimated 29 (*)    All other components within normal limits  TROPONIN I (HIGH SENSITIVITY) - Abnormal; Notable for the following components:   Troponin I (High Sensitivity) 54 (*)    All other components within normal limits  TROPONIN I (HIGH SENSITIVITY)    EKG None EKG shows sinus rhythm at a rate of 78 with right atrial enlargement pattern. Radiology DG Chest 2 View Result Date:  07/27/2023 CLINICAL DATA:  Cough.  Constant chest pain. EXAM: CHEST - 2 VIEW COMPARISON:  04/23/2022. FINDINGS: Bilateral lung fields are clear. Bilateral costophrenic angles are clear. Stable cardio-mediastinal silhouette. No acute osseous abnormalities. The soft tissues are within normal limits. IMPRESSION: No active cardiopulmonary disease. Electronically Signed   By: Ree Molt M.D.   On: 07/27/2023 15:42    Procedures .Critical Care  Performed by: Arloa Chroman, PA-C Authorized by: Arloa Chroman, PA-C   Critical care provider statement:    Critical care time (minutes):  30   Critical care time was exclusive of:  Separately billable procedures and treating other patients  Critical care was necessary to treat or prevent imminent or life-threatening deterioration of the following conditions:  Respiratory failure   Critical care was time spent personally by me on the following activities:  Development of treatment plan with patient or surrogate, discussions with consultants, evaluation of patient's response to treatment, examination of patient, ordering and review of laboratory studies, ordering and review of radiographic studies, ordering and performing treatments and interventions, pulse oximetry, re-evaluation of patient's condition and review of old charts     Medications Ordered in ED Medications  ipratropium-albuterol  (DUONEB) 0.5-2.5 (3) MG/3ML nebulizer solution 3 mL (has no administration in time range)  methylPREDNISolone  sodium succinate (SOLU-MEDROL ) 125 mg/2 mL injection 125 mg (has no administration in time range)  magnesium  sulfate IVPB 2 g 50 mL (has no administration in time range)  acetaminophen  (TYLENOL ) tablet 1,000 mg (has no administration in time range)    ED Course/ Medical Decision Making/ A&P                                 Medical Decision Making Risk OTC drugs. Prescription drug management. Decision regarding hospitalization.   This patient  presents to the ED with chief complaint(s) of shortness of breath and flulike symptoms with pertinent past medical history of CHF tobacco abuse which further complicates the presenting complaint. The complaint involves an extensive differential diagnosis and treatment options and also carries with it a high risk of complications and morbidity.    The differential diagnosis includes, but is not limited to, Pulmonary edema, bronchoconstriction, Pneumonia, Pulmonary embolism, Pneumotherax/ Hemothorax, Dysrythmia, ACS.     The initial plan is to order albuterol , magnesium , Solu-Medrol  for tachypnea wheezing and hypoxic respiratory failure.   Patient placed on 2 L of oxygen for oxygen saturation of 88% and respiratory rate at 34    Additional history obtained: Additional history obtained from family Records reviewed previous admission documents    Imaging Studies: I ordered and independently visualized and interpreted the following imaging  chest x-ray   which showed no acute findings The interpretation of the imaging was limited to assessing for emergent pathology, for which purpose it was ordered.  Consultations Obtained: I requested consultation with the admitting physician Dr. Luise , and discussed  findings as well as pertinent plan - they recommend: admission     Cardiac Monitoring: The patient was maintained on a cardiac monitor.  I personally viewed and interpreted the cardiac monitor which showed an underlying rhythm of:  sinus rhythm  Complexity of problems addressed: Patient's presentation is most consistent with  acute presentation with potential threat to life or bodily function During patient's assessment  Disposition: After consideration of the diagnostic results and the patient's response to treatment,  I feel that the patent would benefit from admission for acute hypoxic respiratory failure in the setting of influenza A.  Do not think that this represents CHF  exacerbation.  Patient's labs do show elevated troponin level.  Her last 3 visits have had elevated troponins.  She has some mild dehydration causing a slight increase in her creatinine up to 1.97.  Suspect that her elevated troponins are likely due to poor clearance and some increased demand. .          Final Clinical Impression(s) / ED Diagnoses Final diagnoses:  None    Rx / DC Orders ED Discharge Orders     None  Arloa Chroman, PA-C 07/31/23 1512    Tegeler, Lonni PARAS, MD 08/01/23 1504

## 2023-07-27 NOTE — ED Triage Notes (Addendum)
 Pt presents with CP started on Monday, pt states it is worse when coughing.  Productive cough noted

## 2023-07-28 DIAGNOSIS — I5032 Chronic diastolic (congestive) heart failure: Secondary | ICD-10-CM | POA: Diagnosis present

## 2023-07-28 DIAGNOSIS — N179 Acute kidney failure, unspecified: Secondary | ICD-10-CM | POA: Diagnosis present

## 2023-07-28 DIAGNOSIS — J441 Chronic obstructive pulmonary disease with (acute) exacerbation: Secondary | ICD-10-CM | POA: Diagnosis present

## 2023-07-28 DIAGNOSIS — Z923 Personal history of irradiation: Secondary | ICD-10-CM | POA: Diagnosis not present

## 2023-07-28 DIAGNOSIS — E785 Hyperlipidemia, unspecified: Secondary | ICD-10-CM | POA: Diagnosis present

## 2023-07-28 DIAGNOSIS — J101 Influenza due to other identified influenza virus with other respiratory manifestations: Secondary | ICD-10-CM | POA: Diagnosis present

## 2023-07-28 DIAGNOSIS — J9601 Acute respiratory failure with hypoxia: Secondary | ICD-10-CM | POA: Diagnosis present

## 2023-07-28 DIAGNOSIS — Z9079 Acquired absence of other genital organ(s): Secondary | ICD-10-CM | POA: Diagnosis not present

## 2023-07-28 DIAGNOSIS — N183 Chronic kidney disease, stage 3 unspecified: Secondary | ICD-10-CM | POA: Diagnosis present

## 2023-07-28 DIAGNOSIS — Z79899 Other long term (current) drug therapy: Secondary | ICD-10-CM | POA: Diagnosis not present

## 2023-07-28 DIAGNOSIS — R7989 Other specified abnormal findings of blood chemistry: Secondary | ICD-10-CM | POA: Diagnosis not present

## 2023-07-28 DIAGNOSIS — Z90710 Acquired absence of both cervix and uterus: Secondary | ICD-10-CM | POA: Diagnosis not present

## 2023-07-28 DIAGNOSIS — E1122 Type 2 diabetes mellitus with diabetic chronic kidney disease: Secondary | ICD-10-CM | POA: Diagnosis present

## 2023-07-28 DIAGNOSIS — F32A Depression, unspecified: Secondary | ICD-10-CM | POA: Diagnosis present

## 2023-07-28 DIAGNOSIS — I130 Hypertensive heart and chronic kidney disease with heart failure and stage 1 through stage 4 chronic kidney disease, or unspecified chronic kidney disease: Secondary | ICD-10-CM | POA: Diagnosis present

## 2023-07-28 DIAGNOSIS — Z87891 Personal history of nicotine dependence: Secondary | ICD-10-CM | POA: Diagnosis not present

## 2023-07-28 DIAGNOSIS — E782 Mixed hyperlipidemia: Secondary | ICD-10-CM | POA: Diagnosis not present

## 2023-07-28 DIAGNOSIS — J111 Influenza due to unidentified influenza virus with other respiratory manifestations: Secondary | ICD-10-CM

## 2023-07-28 DIAGNOSIS — C541 Malignant neoplasm of endometrium: Secondary | ICD-10-CM | POA: Diagnosis present

## 2023-07-28 DIAGNOSIS — Z5986 Financial insecurity: Secondary | ICD-10-CM | POA: Diagnosis not present

## 2023-07-28 DIAGNOSIS — Z90722 Acquired absence of ovaries, bilateral: Secondary | ICD-10-CM | POA: Diagnosis not present

## 2023-07-28 DIAGNOSIS — Z1152 Encounter for screening for COVID-19: Secondary | ICD-10-CM | POA: Diagnosis not present

## 2023-07-28 DIAGNOSIS — Z8249 Family history of ischemic heart disease and other diseases of the circulatory system: Secondary | ICD-10-CM | POA: Diagnosis not present

## 2023-07-28 DIAGNOSIS — Z720 Tobacco use: Secondary | ICD-10-CM

## 2023-07-28 DIAGNOSIS — F1410 Cocaine abuse, uncomplicated: Secondary | ICD-10-CM | POA: Diagnosis present

## 2023-07-28 DIAGNOSIS — Z9049 Acquired absence of other specified parts of digestive tract: Secondary | ICD-10-CM | POA: Diagnosis not present

## 2023-07-28 DIAGNOSIS — E860 Dehydration: Secondary | ICD-10-CM | POA: Diagnosis present

## 2023-07-28 DIAGNOSIS — N390 Urinary tract infection, site not specified: Secondary | ICD-10-CM | POA: Diagnosis present

## 2023-07-28 LAB — CBC WITH DIFFERENTIAL/PLATELET
Abs Immature Granulocytes: 0.02 10*3/uL (ref 0.00–0.07)
Basophils Absolute: 0 10*3/uL (ref 0.0–0.1)
Basophils Relative: 0 %
Eosinophils Absolute: 0 10*3/uL (ref 0.0–0.5)
Eosinophils Relative: 0 %
HCT: 41.3 % (ref 36.0–46.0)
Hemoglobin: 12.9 g/dL (ref 12.0–15.0)
Immature Granulocytes: 0 %
Lymphocytes Relative: 8 %
Lymphs Abs: 0.5 10*3/uL — ABNORMAL LOW (ref 0.7–4.0)
MCH: 29 pg (ref 26.0–34.0)
MCHC: 31.2 g/dL (ref 30.0–36.0)
MCV: 92.8 fL (ref 80.0–100.0)
Monocytes Absolute: 0.2 10*3/uL (ref 0.1–1.0)
Monocytes Relative: 3 %
Neutro Abs: 5.8 10*3/uL (ref 1.7–7.7)
Neutrophils Relative %: 89 %
Platelets: 173 10*3/uL (ref 150–400)
RBC: 4.45 MIL/uL (ref 3.87–5.11)
RDW: 15.5 % (ref 11.5–15.5)
WBC: 6.6 10*3/uL (ref 4.0–10.5)
nRBC: 0 % (ref 0.0–0.2)

## 2023-07-28 LAB — HIV ANTIBODY (ROUTINE TESTING W REFLEX): HIV Screen 4th Generation wRfx: NONREACTIVE

## 2023-07-28 LAB — BASIC METABOLIC PANEL
Anion gap: 12 (ref 5–15)
BUN: 32 mg/dL — ABNORMAL HIGH (ref 8–23)
CO2: 20 mmol/L — ABNORMAL LOW (ref 22–32)
Calcium: 8.8 mg/dL — ABNORMAL LOW (ref 8.9–10.3)
Chloride: 105 mmol/L (ref 98–111)
Creatinine, Ser: 2.19 mg/dL — ABNORMAL HIGH (ref 0.44–1.00)
GFR, Estimated: 25 mL/min — ABNORMAL LOW (ref >60)
Glucose, Bld: 144 mg/dL — ABNORMAL HIGH (ref 70–99)
Potassium: 4 mmol/L (ref 3.5–5.1)
Sodium: 137 mmol/L (ref 135–145)

## 2023-07-28 LAB — CREATININE, SERUM
Creatinine, Ser: 2.2 mg/dL — ABNORMAL HIGH (ref 0.44–1.00)
GFR, Estimated: 25 mL/min — ABNORMAL LOW (ref >60)

## 2023-07-28 MED ORDER — AMLODIPINE BESYLATE 10 MG PO TABS
10.0000 mg | ORAL_TABLET | Freq: Every day | ORAL | Status: DC
  Administered 2023-07-28 – 2023-07-31 (×4): 10 mg via ORAL
  Filled 2023-07-28 (×4): qty 1

## 2023-07-28 MED ORDER — PANTOPRAZOLE SODIUM 40 MG PO TBEC
40.0000 mg | DELAYED_RELEASE_TABLET | Freq: Every day | ORAL | Status: DC
  Administered 2023-07-28 – 2023-07-31 (×4): 40 mg via ORAL
  Filled 2023-07-28 (×4): qty 1

## 2023-07-28 MED ORDER — PREDNISONE 20 MG PO TABS
40.0000 mg | ORAL_TABLET | Freq: Every day | ORAL | Status: DC
  Administered 2023-07-29 – 2023-07-31 (×3): 40 mg via ORAL
  Filled 2023-07-28 (×3): qty 2

## 2023-07-28 MED ORDER — LOSARTAN POTASSIUM 50 MG PO TABS
50.0000 mg | ORAL_TABLET | Freq: Every day | ORAL | Status: DC
  Administered 2023-07-28 – 2023-07-31 (×4): 50 mg via ORAL
  Filled 2023-07-28 (×4): qty 1

## 2023-07-28 MED ORDER — IPRATROPIUM BROMIDE 0.02 % IN SOLN
0.5000 mg | Freq: Four times a day (QID) | RESPIRATORY_TRACT | Status: DC
  Administered 2023-07-28 – 2023-07-29 (×5): 0.5 mg via RESPIRATORY_TRACT
  Filled 2023-07-28 (×5): qty 2.5

## 2023-07-28 MED ORDER — OSELTAMIVIR PHOSPHATE 30 MG PO CAPS
30.0000 mg | ORAL_CAPSULE | Freq: Two times a day (BID) | ORAL | Status: DC
  Administered 2023-07-28 – 2023-07-29 (×4): 30 mg via ORAL
  Filled 2023-07-28 (×4): qty 1

## 2023-07-28 MED ORDER — ACETAMINOPHEN 650 MG RE SUPP: 650.0000 mg | Freq: Four times a day (QID) | RECTAL | Status: DC | PRN

## 2023-07-28 MED ORDER — MELATONIN 5 MG PO TABS
5.0000 mg | ORAL_TABLET | Freq: Every day | ORAL | Status: DC
  Administered 2023-07-28 – 2023-07-30 (×4): 5 mg via ORAL
  Filled 2023-07-28 (×4): qty 1

## 2023-07-28 MED ORDER — FLUOXETINE HCL 20 MG PO CAPS
20.0000 mg | ORAL_CAPSULE | Freq: Every day | ORAL | Status: DC
  Administered 2023-07-28 – 2023-07-31 (×4): 20 mg via ORAL
  Filled 2023-07-28 (×4): qty 1

## 2023-07-28 MED ORDER — ACETAMINOPHEN 325 MG PO TABS
650.0000 mg | ORAL_TABLET | Freq: Four times a day (QID) | ORAL | Status: DC | PRN
  Administered 2023-07-28 – 2023-07-30 (×3): 650 mg via ORAL
  Filled 2023-07-28 (×3): qty 2

## 2023-07-28 MED ORDER — BUPROPION HCL ER (XL) 150 MG PO TB24
150.0000 mg | ORAL_TABLET | Freq: Every day | ORAL | Status: DC
  Administered 2023-07-28 – 2023-07-31 (×4): 150 mg via ORAL
  Filled 2023-07-28 (×4): qty 1

## 2023-07-28 MED ORDER — HYDRALAZINE HCL 25 MG PO TABS
100.0000 mg | ORAL_TABLET | Freq: Three times a day (TID) | ORAL | Status: DC
  Administered 2023-07-28 – 2023-07-31 (×11): 100 mg via ORAL
  Filled 2023-07-28 (×11): qty 4

## 2023-07-28 MED ORDER — ENSURE ENLIVE PO LIQD
237.0000 mL | Freq: Three times a day (TID) | ORAL | Status: DC
  Administered 2023-07-28 – 2023-07-31 (×6): 237 mL via ORAL

## 2023-07-28 MED ORDER — GUAIFENESIN 100 MG/5ML PO LIQD
5.0000 mL | ORAL | Status: DC | PRN
  Administered 2023-07-29: 5 mL via ORAL
  Filled 2023-07-28: qty 10

## 2023-07-28 MED ORDER — HEPARIN SODIUM (PORCINE) 5000 UNIT/ML IJ SOLN
5000.0000 [IU] | Freq: Three times a day (TID) | INTRAMUSCULAR | Status: DC
  Administered 2023-07-28 – 2023-07-31 (×11): 5000 [IU] via SUBCUTANEOUS
  Filled 2023-07-28 (×11): qty 1

## 2023-07-28 MED ORDER — METHYLPREDNISOLONE SODIUM SUCC 40 MG IJ SOLR
40.0000 mg | Freq: Two times a day (BID) | INTRAMUSCULAR | Status: AC
  Administered 2023-07-28 (×2): 40 mg via INTRAVENOUS
  Filled 2023-07-28 (×2): qty 1

## 2023-07-28 MED ORDER — ATORVASTATIN CALCIUM 40 MG PO TABS
80.0000 mg | ORAL_TABLET | Freq: Every day | ORAL | Status: DC
  Administered 2023-07-28 – 2023-07-31 (×4): 80 mg via ORAL
  Filled 2023-07-28 (×4): qty 2

## 2023-07-28 MED ORDER — SODIUM CHLORIDE 0.9 % IV SOLN
500.0000 mg | INTRAVENOUS | Status: AC
  Administered 2023-07-28: 500 mg via INTRAVENOUS
  Filled 2023-07-28: qty 5

## 2023-07-28 MED ORDER — NICOTINE 14 MG/24HR TD PT24
14.0000 mg | MEDICATED_PATCH | Freq: Every day | TRANSDERMAL | Status: DC
  Administered 2023-07-28 – 2023-07-31 (×4): 14 mg via TRANSDERMAL
  Filled 2023-07-28 (×4): qty 1

## 2023-07-28 MED ORDER — MOMETASONE FURO-FORMOTEROL FUM 200-5 MCG/ACT IN AERO
2.0000 | INHALATION_SPRAY | Freq: Two times a day (BID) | RESPIRATORY_TRACT | Status: DC
  Administered 2023-07-28 – 2023-07-31 (×8): 2 via RESPIRATORY_TRACT
  Filled 2023-07-28 (×2): qty 8.8

## 2023-07-28 MED ORDER — BENZONATATE 100 MG PO CAPS
100.0000 mg | ORAL_CAPSULE | Freq: Three times a day (TID) | ORAL | Status: DC
  Administered 2023-07-28 – 2023-07-31 (×11): 100 mg via ORAL
  Filled 2023-07-28 (×11): qty 1

## 2023-07-28 MED ORDER — AZITHROMYCIN 250 MG PO TABS
500.0000 mg | ORAL_TABLET | Freq: Every day | ORAL | Status: AC
  Administered 2023-07-28 – 2023-07-31 (×4): 500 mg via ORAL
  Filled 2023-07-28 (×4): qty 2

## 2023-07-28 MED ORDER — SENNOSIDES-DOCUSATE SODIUM 8.6-50 MG PO TABS: 1.0000 | ORAL_TABLET | Freq: Every evening | ORAL | Status: DC | PRN

## 2023-07-28 MED ORDER — ALBUTEROL SULFATE (2.5 MG/3ML) 0.083% IN NEBU
2.5000 mg | INHALATION_SOLUTION | RESPIRATORY_TRACT | Status: DC | PRN
  Administered 2023-07-28: 2.5 mg via RESPIRATORY_TRACT
  Filled 2023-07-28: qty 3

## 2023-07-28 NOTE — Plan of Care (Signed)

## 2023-07-28 NOTE — Progress Notes (Signed)
 PHARMACY NOTE:  ANTIMICROBIAL RENAL DOSAGE ADJUSTMENT  Current antimicrobial regimen includes a mismatch between antimicrobial dosage and estimated renal function.  As per policy approved by the Pharmacy & Therapeutics and Medical Executive Committees, the antimicrobial dosage will be adjusted accordingly.  Current antimicrobial dosage:  tamiflu  75mg  bid  Indication: flu  Renal Function:  Estimated Creatinine Clearance: 34 mL/min (A) (by C-G formula based on SCr of 1.93 mg/dL (H)). []      On intermittent HD, scheduled: []      On CRRT    Antimicrobial dosage has been changed to:  tamiflu  30mg  bid  Additional comments:   Thank you for allowing pharmacy to be a part of this patient's care. Leeroy Mace RPh 07/28/2023, 12:27 AM

## 2023-07-28 NOTE — Progress Notes (Signed)
 Initial Nutrition Assessment  DOCUMENTATION CODES:   Obesity unspecified  INTERVENTION:  - Liberalize to a Regular diet to avoid restricting intake. - Ensure Plus High Protein po BID, each supplement provides 350 kcal and 20 grams of protein. - Encourage intake as tolerated.  - Monitor weight trends.   NUTRITION DIAGNOSIS:   Inadequate oral intake related to cancer and cancer related treatments, poor appetite, mouth pain, other (see comment) ([place]) as evidenced by energy intake < or equal to 50% for > or equal to 5 days.  GOAL:   Patient will meet greater than or equal to 90% of their needs  MONITOR:   PO intake, Supplement acceptance, Weight trends  REASON FOR ASSESSMENT:   Consult Assessment of nutrition requirement/status  ASSESSMENT:   63 y.o. female with PMH significant for COPD, endometrial cancer (on chemotherapy and radiation therapy), HLD, depression, CKD stage III who presented with with a 3 day history of SOB, wheezing, coughing, fevers, N/V/D, and poor appetite. Admitted for influenza A and COPD exacerbation.  Patient reports a UBW of 216# and denies any recent changes in weight. Per EMR, weight stable since at least July.  Patient reports eating around 1 meal a day at home, usually in the morning.  Shares that over the past few weeks her appetite has gotten very poor, she has lost her sense of taste, and her teeth have started to fall out. All of these things have made it difficult for her to eat well. Notes she has more been nibbling on food during the day. States she hasn't had anything over the past 4 days.  Has not had anything to eat so far today. Ordered patient her desired meal of cream of chicken soup, jello, and coffee. Discussed increased calorie and protein needs and encouraged patient to consume 3 meals a day as tolerated.  Has never tried nutrition supplements but agreeable to try Ensure during admission.     Medications reviewed and include:  -  Labs reviewed:  Creatinine 2.20   NUTRITION - FOCUSED PHYSICAL EXAM:  Will attempt at next visit, no visible depletions  Diet Order:   Diet Order             Diet Heart Room service appropriate? Yes; Fluid consistency: Thin  Diet effective now                   EDUCATION NEEDS:  Education needs have been addressed  Skin:  Skin Assessment: Reviewed RN Assessment  Last BM:  2/6  Height:  Ht Readings from Last 1 Encounters:  07/27/23 5' 3 (1.6 m)   Weight:  Wt Readings from Last 1 Encounters:  07/27/23 97.4 kg    BMI:  Body mass index is 38.05 kg/m.  Estimated Nutritional Needs:  Kcal:  1900-2100 kcals Protein:  100-115 grams Fluid:  >/= 1.9L    Trude Ned RD, LDN Contact via Secure Chat.

## 2023-07-28 NOTE — Evaluation (Signed)
 Physical Therapy Evaluation Patient Details Name: Angela Gardner MRN: [id number] DOB: November 18, 1960 Today's Date: 07/28/2023  History of Present Illness  Pt is a 63 y/o F admitted on 07/27/23 after presenting with c/o SOB, wheezing, coughing, fevers & chills. Pt tested positive for Influenza A, COPD exacerbation. PMH: COPD, tobacco use, endometrial CA on chemo & radiation therapy, HLD, depression, CKD 3  Clinical Impression  Pt seen for PT evaluation with pt agreeable to tx. Pt reports prior to admission she was living in a mobile home with 4 steps without rails to enter. Pt reports limited walking but is able to ambulate to the mailbox & back without AD, but does endorse falls prior to admission. On this date, pt attempts gait without AD but experiences LOB so provided pt with RW & pt ambulates with RW & CGA. Pt would benefit from ongoing PT services to address strengthening, balance, endurance to increase independence with gait & stair negotiation & decrease fall risk.    If plan is discharge home, recommend the following: A little help with walking and/or transfers;A little help with bathing/dressing/bathroom;Assistance with cooking/housework;Assist for transportation;Help with stairs or ramp for entrance   Can travel by private vehicle        Equipment Recommendations Rolling walker (2 wheels)  Recommendations for Other Services       Functional Status Assessment Patient has had a recent decline in their functional status and demonstrates the ability to make significant improvements in function in a reasonable and predictable amount of time.     Precautions / Restrictions Precautions Precautions: Fall Restrictions Weight Bearing Restrictions Per Provider Order: No      Mobility  Bed Mobility Overal bed mobility: Modified Independent Bed Mobility: Supine to Sit     Supine to sit: Modified independent (Device/Increase time), HOB elevated, Used rails     General bed mobility  comments: exit R side of bed    Transfers Overall transfer level: Needs assistance Equipment used: None Transfers: Sit to/from Stand Sit to Stand: Supervision           General transfer comment: STS from EOB    Ambulation/Gait Ambulation/Gait assistance: Contact guard assist, Supervision Gait Distance (Feet): 150 Feet Assistive device: None, Rolling walker (2 wheels) Gait Pattern/deviations: Decreased step length - right, Decreased stride length, Decreased step length - left Gait velocity: decreased     General Gait Details: Pt initially ambulates without AD with supervision but experiences LOB requiring min assist to correct. PT provides pt with RW & pt ambulates with supervision, increasing to CGA as pt appears & endorses being nervous with PT providing encouragement. PT educated pt on proper use of AD & positioning within AD.  Stairs            Wheelchair Mobility     Tilt Bed    Modified Rankin (Stroke Patients Only)       Balance Overall balance assessment: Needs assistance Sitting-balance support: Feet supported Sitting balance-Leahy Scale: Good     Standing balance support: During functional activity, Bilateral upper extremity supported, Reliant on assistive device for balance Standing balance-Leahy Scale: Fair                               Pertinent Vitals/Pain Pain Assessment Pain Assessment: Faces Faces Pain Scale: Hurts little more Pain Location: pins & needles in her back Pain Descriptors / Indicators:  (pins & needles) Pain Intervention(s): Monitored during session  Home Living Family/patient expects to be discharged to:: Private residence Living Arrangements: Other relatives (sister) Available Help at Discharge: Family;Available PRN/intermittently (sister works outside of the home during the day) Type of Home: Mobile home Home Access: Stairs to enter Entrance Stairs-Rails: None Entrance Stairs-Number of Steps: 4   Home  Layout: One level Home Equipment: Rollator (4 wheels);Cane - single point;BSC/3in1      Prior Function               Mobility Comments: Pt reports she can walk to the mailbox & back without AD but limits walking, pt notes she falls all the time but only reports 2 in the past 6 months. ADLs Comments: Pt uses BSC primarily & empties it herself when she needs to, limits walking 2/2 poor floor conditions. Sponge bathes with water  they buy at the store as they do not have running water  in the home & haven't for 6 years. Pt reports she also uses the bathing wipes.     Extremity/Trunk Assessment   Upper Extremity Assessment Upper Extremity Assessment: Overall WFL for tasks assessed    Lower Extremity Assessment Lower Extremity Assessment: Generalized weakness;Overall WFL for tasks assessed       Communication   Communication Communication: No apparent difficulties  Cognition Arousal: Alert Behavior During Therapy: WFL for tasks assessed/performed Overall Cognitive Status: Within Functional Limits for tasks assessed                                 General Comments: pt is AxOx4 & follows commands well throughout session but at end of session pt holds head & states it's my 6th sense but does not elaborate.        General Comments General comments (skin integrity, edema, etc.): Pt on 2L/min via nasal cannula with SpO2 > or equal to 90%.    Exercises     Assessment/Plan    PT Assessment Patient needs continued PT services  PT Problem List Decreased strength;Cardiopulmonary status limiting activity;Decreased activity tolerance;Decreased balance;Decreased mobility;Decreased knowledge of use of DME       PT Treatment Interventions DME instruction;Balance training;Gait training;Neuromuscular re-education;Stair training;Functional mobility training;Therapeutic activities;Therapeutic exercise;Patient/family education    PT Goals (Current goals can be found in the  Care Plan section)  Acute Rehab PT Goals Patient Stated Goal: get better PT Goal Formulation: With patient Time For Goal Achievement: 08/11/23 Potential to Achieve Goals: Good    Frequency Min 1X/week     Co-evaluation               AM-PAC PT 6 Clicks Mobility  Outcome Measure Help needed turning from your back to your side while in a flat bed without using bedrails?: None Help needed moving from lying on your back to sitting on the side of a flat bed without using bedrails?: None Help needed moving to and from a bed to a chair (including a wheelchair)?: A Little Help needed standing up from a chair using your arms (e.g., wheelchair or bedside chair)?: A Little Help needed to walk in hospital room?: A Little Help needed climbing 3-5 steps with a railing? : A Little 6 Click Score: 20    End of Session Equipment Utilized During Treatment: Oxygen Activity Tolerance: Patient tolerated treatment well Patient left: in chair;with call bell/phone within reach Nurse Communication: Mobility status (pt's IV came out when pt donned 2nd gown on backside) PT Visit Diagnosis: Unsteadiness on feet (R26.81);Muscle  weakness (generalized) (M62.81)    Time: 8942-8880 PT Time Calculation (min) (ACUTE ONLY): 22 min   Charges:   PT Evaluation $PT Eval Low Complexity: 1 Low   PT General Charges $$ ACUTE PT VISIT: 1 Visit         Richerd Pinal, PT, DPT 07/28/23, 11:34 AM   Richerd CHRISTELLA Pinal 07/28/2023, 11:32 AM

## 2023-07-28 NOTE — H&P (Addendum)
 PCP:   Delbert Clam, MD   Chief Complaint:  Weakness.  HPI: This is a 63 year old female past medical history significant for COPD, tobacco use, endometrial cancer [on chemotherapy and radiation therapy], HLD, depression, CKD stage III.  Per patient she saw her oncologist on Monday and has been in bed since.  Since Monday she developed shortness of breath, wheezing, coughing, fevers and chills.  She also endorses nausea, vomiting, diarrhea, body aches, weakness and poor appetite.  She decided to come to the ER.  In the ER presenting vitals 183/121, 79, 22, Tmax 100.9.  Patient hypoxic, placed on 2 L oxygen.  Patient is influenza A positive.  Chest x-ray normal.  In the ER patient wheezing.  Patient given IV Solu-Medrol , 2 g IV mag, nebulizers.  Admission requested.  Review of Systems:  Per HPI  Past Medical History: Past Medical History:  Diagnosis Date   Acute bronchitis 12/04/2008   Qualifier: Diagnosis of  By: Janna MD, Burnard     Acute CHF (congestive heart failure) (HCC) 09/12/2017   Acute exacerbation of CHF (congestive heart failure) (HCC) 09/13/2017   Anemia    Antineoplastic chemotherapy induced anemia 04/05/2018   Cancer (HCC)    left ductal papilloma   Chest discomfort 09/12/2017   Negative stress test in May 2019   Chest pain 12/20/2019   CHF (congestive heart failure) (HCC)    Chronic kidney disease (CKD), stage III (moderate) 10/30/2017   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   PMB (postmenopausal bleeding) 06/17/2015   Prediabetes 04/02/2018   Preventative health care 11/07/2018   Renal disorder    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   Vitamin D  deficiency 02/15/2018   Past Surgical History:  Procedure Laterality Date   BREAST EXCISIONAL BIOPSY Left 2013   CESAREAN SECTION     one   CHOLECYSTECTOMY     DILATION AND CURETTAGE OF UTERUS     approx 1 year ago   IR IMAGING GUIDED PORT INSERTION  12/06/2017   IR REMOVAL TUN ACCESS W/ PORT W/O FL MOD SED  06/19/2018   LYMPH NODE BIOPSY N/A 11/14/2017   Procedure: SENTINEL LYMPH NODE BIOPSY;  Surgeon: Eloy Herring, MD;  Location: [facility];  Service: Gynecology;  Laterality: N/A;   ROBOTIC ASSISTED TOTAL HYSTERECTOMY WITH BILATERAL SALPINGO OOPHERECTOMY N/A 11/14/2017   Procedure: XI ROBOTIC ASSISTED TOTAL HYSTERECTOMY WITH BILATERAL SALPINGO OOPHORECTOMY, RIGHT PELVIC LYMPHADENECTOMY;  Surgeon: Eloy Herring,  MD;  Location: [facility];  Service: Gynecology;  Laterality: N/A;    Medications: Prior to Admission medications   Medication Sig Start Date End Date Taking? Authorizing Provider  amLODipine  (NORVASC ) 10 MG tablet Take 1 tablet (10 mg total) by mouth daily. Please schedule an appointment with Dr. Newlin 11/16/22  Yes Newlin, Enobong, MD  atorvastatin  (LIPITOR) 80 MG tablet Take 1 tablet (80 mg total) by mouth daily. Please make an appointment with Dr. Newlin. 02/08/23  Yes Newlin, Enobong, MD  buPROPion   (WELLBUTRIN  XL) 150 MG 24 hr tablet Take 1 tablet (150 mg total) by mouth daily for smoking cessation 03/07/22  Yes Newlin, Enobong, MD  FLUoxetine  (PROZAC ) 20 MG capsule Take 1 capsule (20 mg total) by mouth daily.Please make PCP appointment for more refills. 12/26/22  Yes Newlin, Enobong, MD  furosemide  (LASIX ) 40 MG tablet Take 1 tablet (40 mg total) by mouth 2 (two) times daily. Please schedule an appointment with Dr. Newlin 11/16/22  Yes Newlin, Enobong, MD  hydrALAZINE  (APRESOLINE ) 100 MG tablet Take 1 tablet (100 mg total) by mouth 3 (three) times daily. Please schedule an appointment with Dr. Newlin 11/16/22  Yes Newlin, Enobong, MD  losartan  (COZAAR ) 50 MG tablet Take 1 tablet (50 mg total) by mouth daily. 04/17/23  Yes   pantoprazole  (PROTONIX ) 40 MG tablet Take 1 tablet (40 mg total) by mouth daily. Please schedule an appointment with Dr. Newlin 11/16/22  Yes Newlin, Enobong, MD  fluconazole  (DIFLUCAN ) 150 MG tablet Take 1 tablet (150 mg total) by mouth daily. Take second tablet 72 hours later Patient not taking: Reported on 07/27/2023 07/24/23   Shannon Agent, MD  Misc. Devices MISC Bedside commode.  Diagnosis urinary incontinence 06/01/22   Delbert Clam, MD  prochlorperazine  (COMPAZINE ) 10 MG tablet Take 1 tablet (10 mg total) by mouth every 6 (six) hours as needed (Nausea or vomiting). Patient not taking: Reported on 05/25/2018 12/08/17 05/29/18  Lonn Hicks, MD    Allergies:   Allergies  Allergen Reactions   Vicodin [Hydrocodone-Acetaminophen ] Itching    Social History:  reports that she has quit smoking. Her smoking use included cigarettes. She has a 7.8 pack-year smoking history. She has never used smokeless tobacco. She reports that she does not currently use alcohol . She reports that she does not currently use drugs after having used the following drugs: Cocaine and Crack cocaine.  Family History: Family History  Problem Relation Age of Onset   Anuerysm Mother    Congestive  Heart Failure Mother    Hypertension Father    Hypertension Sister    Hypertension Brother    Colon cancer Neg Hx     Physical Exam: Vitals:   07/27/23 1400 07/27/23 1902 07/27/23 2040 07/27/23 2346  BP:  (!) 154/110 (!) 172/94 (!) 142/81  Pulse:  80 74 73  Resp:  16 16 (!) 22  Temp:  (!) 100.9 F (38.3 C) 98.9 F (37.2 C) 97.9 F (36.6 C)  TempSrc:  Oral Oral Oral  SpO2:  94% 94% 94%  Weight: 97.4 kg     Height: 5' 3 (1.6 m)       General:  A&Ox3, well developed and nourished, no acute distress Eyes: Pink conjunctiva, no scleral icterus ENT: Moist oral mucosa, neck supple, no thyromegaly Lungs: Mild wheeze, no crackles, no use of accessory muscles Cardiovascular: RRR, no bruits, no JVD Abdomen: soft, positive BS, NTND, no organomegaly, not an acute abdomen GU: not examined Neuro: CN II - XII grossly intact, sensation  intact Musculoskeletal: strength 5/5 all extremities, no edema Skin: no rash, no subcutaneous crepitation, no decubitus Psych: appropriate patient   Labs on Admission:  Recent Labs    07/27/23 1420  NA 139  K 3.7  CL 105  CO2 22  GLUCOSE 104*  BUN 25*  CREATININE 1.93*  CALCIUM  8.8*   Recent Labs    07/27/23 1420  AST 23  ALT 12  ALKPHOS 81  BILITOT 0.7  PROT 7.2  ALBUMIN 3.5    Recent Labs    07/27/23 1420  WBC 3.8*  NEUTROABS 2.4  HGB 14.1  HCT 46.4*  MCV 93.4  PLT 178    Micro Results: Recent Results (from the past 240 hours)  Resp panel by RT-PCR (RSV, Flu A&B, Covid) Anterior Nasal Swab     Status: Abnormal   Collection Time: 07/27/23  2:22 PM   Specimen: Anterior Nasal Swab  Result Value Ref Range Status   SARS Coronavirus 2 by RT PCR NEGATIVE NEGATIVE Final    Comment: (NOTE) SARS-CoV-2 target nucleic acids are NOT DETECTED.  The SARS-CoV-2 RNA is generally detectable in upper respiratory specimens during the acute phase of infection. The lowest concentration of SARS-CoV-2 viral copies this assay can detect  is 138 copies/mL. A negative result does not preclude SARS-Cov-2 infection and should not be used as the sole basis for treatment or other patient management decisions. A negative result may occur with  improper specimen collection/handling, submission of specimen other than nasopharyngeal swab, presence of viral mutation(s) within the areas targeted by this assay, and inadequate number of viral copies(<138 copies/mL). A negative result must be combined with clinical observations, patient history, and epidemiological information. The expected result is Negative.  Fact Sheet for Patients:  bloggercourse.com  Fact Sheet for Healthcare Providers:  seriousbroker.it  This test is no t yet approved or cleared by the [place]  FDA and  has been authorized for detection and/or diagnosis of SARS-CoV-2 by FDA under an Emergency Use Authorization (EUA). This EUA will remain  in effect (meaning this test can be used) for the duration of the COVID-19 declaration under Section 564(b)(1) of the Act, 21 U.S.C.section 360bbb-3(b)(1), unless the authorization is terminated  or revoked sooner.       Influenza A by PCR POSITIVE (A) NEGATIVE Final   Influenza B by PCR NEGATIVE NEGATIVE Final    Comment: (NOTE) The Xpert Xpress SARS-CoV-2/FLU/RSV plus assay is intended as an aid in the diagnosis of influenza from Nasopharyngeal swab specimens and should not be used as a sole basis for treatment. Nasal washings and aspirates are unacceptable for Xpert Xpress SARS-CoV-2/FLU/RSV testing.  Fact Sheet for Patients: bloggercourse.com  Fact Sheet for Healthcare Providers: seriousbroker.it  This test is not yet approved or cleared by the [place]  FDA and has been authorized for detection and/or diagnosis of SARS-CoV-2 by FDA under an Emergency Use Authorization (EUA). This EUA will remain in  effect (meaning this test can be used) for the duration of the COVID-19 declaration under Section 564(b)(1) of the Act, 21 U.S.C. section 360bbb-3(b)(1), unless the authorization is terminated or revoked.     Resp Syncytial Virus by PCR NEGATIVE NEGATIVE Final    Comment: (NOTE) Fact Sheet for Patients: bloggercourse.com  Fact Sheet for Healthcare Providers: seriousbroker.it  This test is not yet approved or cleared by the [place]  FDA and has been authorized for detection and/or diagnosis of SARS-CoV-2 by FDA under an Emergency Use Authorization (EUA). This EUA will remain in effect (  meaning this test can be used) for the duration of the COVID-19 declaration under Section 564(b)(1) of the Act, 21 U.S.C. section 360bbb-3(b)(1), unless the authorization is terminated or revoked.  Performed at [facility], [address], [place], [place] [address]      Radiological Exams on Admission: DG Chest 2 View Result Date: 07/27/2023 CLINICAL DATA:  Cough.  Constant chest pain. EXAM: CHEST - 2 VIEW COMPARISON:  04/23/2022. FINDINGS: Bilateral lung fields are clear. Bilateral costophrenic angles are clear. Stable cardio-mediastinal silhouette. No acute osseous abnormalities. The soft tissues are within normal limits. IMPRESSION: No active cardiopulmonary disease. Electronically Signed   By: Ree Molt M.D.   On: 07/27/2023 15:42    Assessment/Plan Present on Admission:  Influenza A  Acute respiratory failure with hypoxia -Tamiflu  ordered -Tessalon  Perles scheduled.  Robitussin as needed -Oxygen to keep sats between 88%   Acute COPD exacerbation -COPD order set initiated -Continue IV Solu-Medrol  ordered -Atrovent  scheduled.  DuoNebs as needed -Nebulizers as needed -Azithromycin  ordered -Dulera  daily  Tobacco use -Nicotine  patch ordered   Elevated troponin -Troponin elevated but flat  Chronic kidney  disease (CKD), stage III (moderate) (HCC) -Stable at baseline   HTN -Norvasc , hydralazine , Cozaar  resumed   Depression -Prozac , Wellbutrin  resumed   Hyperlipemia -Atorvastatin  resumed   Endometrial cancer (HCC) on chemo and radiation tx -Per oncologist  History of polysubstance abuse  Alasha Mcguinness 07/28/2023, 12:06 AM

## 2023-07-28 NOTE — Progress Notes (Signed)
 Called the ED back to speak to Rn taking care of this patient but no response. Awaiting call back

## 2023-07-28 NOTE — TOC Initial Note (Signed)
 Transition of Care [facility]) - Initial/Assessment Note    Patient Details  Name: Angela Gardner MRN: [id number] Date of Birth: Aug 01, 1960  Transition of Care [facility]) CM/SW Contact:    Luann SHAUNNA Cumming, LCSW Phone Number: 07/28/2023, 12:33 PM  Clinical Narrative:                  PT is recommending HH. RN also noted that pt having issues with her trailer, using wood found outside to cover holes on floor and not having running water . CSW met with pt who confirms poor condition of trailer. She is currently living in the trailer with her sister. Pt states that her daughter purchased another trailer nearby but that trailer needs the floor replaced in the front room before she can move in. Pt states her pastor had agreed to fix floor in the trailor that her daughter bought. CSW encouraged pt to contact pastor while she is in the hospital to assist with flooring. Pt explains with the current condition of her home, she would prefer OP PT instead of HH. She states she already has a walker at home. Pt's sister has a vehicle and is able to give pt rides at times.   CSW made OP PT referral CSW added housing and utility resources to AVS.   Expected Discharge Plan: OP Rehab Barriers to Discharge: Continued Medical Work up   Patient Goals and CMS Choice   CMS Medicare.gov Compare Post Acute Care list provided to:: Patient Choice offered to / list presented to : Patient      Expected Discharge Plan and Services       Living arrangements for the past 2 months: Hotel/Motel                                      Prior Living Arrangements/Services Living arrangements for the past 2 months: Hotel/Motel Lives with:: Siblings                   Activities of Daily Living   ADL Screening (condition at time of admission) Independently performs ADLs?: Yes (appropriate for developmental age) Is the patient deaf or have difficulty hearing?: No Does the patient have difficulty seeing, even when  wearing glasses/contacts?: No Does the patient have difficulty concentrating, remembering, or making decisions?: No  Permission Sought/Granted                  Emotional Assessment Appearance:: Appears stated age Attitude/Demeanor/Rapport: Engaged Affect (typically observed): Accepting Orientation: : Oriented to Self, Oriented to Place, Oriented to  Time, Oriented to Situation Alcohol  / Substance Use: Not Applicable Psych Involvement: No (comment)  Admission diagnosis:  Influenza A [J10.1] Elevated troponin [R79.89] Acute respiratory failure with hypoxia (HCC) [J96.01] Patient Active Problem List   Diagnosis Date Noted   Influenza 07/28/2023   Acute respiratory failure with hypoxia (HCC) 07/28/2023   COPD with acute exacerbation (HCC) 07/28/2023   Tobacco use 07/28/2023   Influenza A 07/28/2023   Urinary incontinence 11/19/2021   Acute hypoxemic respiratory failure due to COVID-19 (HCC) 01/07/2021   Cocaine abuse (HCC) 01/07/2021   Noncompliance 09/25/2020   CHF (congestive heart failure) (HCC)    Anemia    Cancer (HCC)    Dyspnea    History of sleep walking    Hx of left breast biopsy    Hyperlipemia    Hypertension    Renal disorder  Chest pain 12/20/2019   Left knee pain    Preventative health care 11/07/2018   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   Antineoplastic chemotherapy induced anemia 04/05/2018   Depression 04/02/2018   Prediabetes 04/02/2018   Vitamin D  deficiency 02/15/2018   Diffuse pain 01/04/2018   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   Deficiency anemia 12/06/2017   Other constipation 11/28/2017   Illicit drug use 11/28/2017   Chronic kidney disease (CKD), stage III (moderate) (HCC) 10/30/2017   Essential hypertension 10/27/2017   Diastolic congestive heart failure, NYHA class 3 (HCC) 10/27/2017   Endometrial cancer (HCC) 10/19/2017   Morbid obesity (HCC) 10/09/2017   Acute exacerbation of CHF  (congestive heart failure) (HCC) 09/13/2017   Hypertensive crisis 09/12/2017   Mild renal insufficiency 09/12/2017   Dysfunctional uterine bleeding 09/12/2017   Acute CHF (congestive heart failure) (HCC) 09/12/2017   Chest discomfort 09/12/2017   PMB (postmenopausal bleeding) 06/17/2015   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   OTITIS MEDIA 12/04/2008   ACUTE BRONCHITIS 12/04/2008   PCP:  Delbert Clam, MD Pharmacy:   [facility] [address], [address] [place] [address] Phone: [phone] Fax: [phone]     Social Drivers of Health (SDOH) Social History: SDOH Screenings   Food Insecurity: Food Insecurity Present (07/28/2023)  Housing: High Risk (07/28/2023)  Transportation Needs: Unmet Transportation Needs (07/28/2023)  Utilities: At Risk (07/28/2023)  Alcohol  Screen: Low Risk  (02/14/2023)  Depression (PHQ2-9): Medium Risk (02/14/2023)  Financial Resource Strain: Low Risk  (02/14/2023)  Physical Activity: Inactive (02/14/2023)  Social Connections: Socially Isolated (02/14/2023)  Stress: No Stress Concern Present (02/14/2023)  Tobacco Use: Medium Risk (07/27/2023)  Health Literacy: Adequate Health Literacy (02/14/2023)   SDOH Interventions:     Readmission Risk Interventions     No data to display

## 2023-07-28 NOTE — Progress Notes (Signed)
 PROGRESS NOTE  Angela Gardner  FMW:[id number] DOB: 10-Mar-1961 DOA: 07/27/2023 PCP: Delbert Clam, MD  Consultants  Brief Narrative: This is a 63 year old female past medical history significant for COPD, tobacco use, endometrial cancer [on chemotherapy and radiation therapy], HLD, depression, CKD stage III.  She presented f to the ED on 07/27/2023 secondary to bodyaches, weakness, poor appetite.  Found to be hypoxic and subsequently diagnosed with flu A.  Assessment & Plan: Influenza A  Acute respiratory failure with hypoxia -Still feels fairly exhausted.  Also with persistent cough and ongoing chills. -Tamiflu  ordered -Tessalon  Perles scheduled.  Robitussin as needed -Oxygen to keep sats between 88%.  O2 sats better since admission.    Acute COPD exacerbation -COPD order set initiated.  Some wheezing but lungs actually sound pretty good this morning on exam. -Continue IV Solu-Medrol  ordered -Atrovent  scheduled.  DuoNebs as needed -Nebulizers as needed -Azithromycin  ordered -Dulera  daily   Tobacco use -Nicotine  patch ordered    Elevated troponin -Troponin minimally elevated but flat.  She actually denies any chest pain. -Highly unlikely to be ACS on top of flu and COPD exacerbation but will continue to ask about chest pain.  No ischemic changes noted on EKG   Chronic kidney disease (CKD), stage III (moderate) (HCC) -Acute kidney injury on top of her chronic CKD. -GFR now hovering between 25 and 29.  Continue to monitor.  Hydrate    HTN -Norvasc , hydralazine , Cozaar  resumed    Depression -Prozac , Wellbutrin  resumed    Hyperlipemia -Atorvastatin  resumed    Endometrial cancer (HCC) on chemo and radiation tx -Per oncologist  Nutrition Problem: Inadequate oral intake Etiology: cancer and cancer related treatments, poor appetite, mouth pain, other (see comment) ([place]) Signs/Symptoms: energy intake < or equal to 50% for > or equal to 5 days Interventions: Refer to RD note  for recommendations, Ensure Enlive (each supplement provides 350kcal and 20 grams of protein)   DVT prophylaxis:  heparin  injection 5,000 Units Start: 07/28/23 0600  Code Status:   Code Status: Full Code Level of care: Med-Surg Status is: Inpatient Remains inpatient appropriate because: Hypoxia in light of COPD exacerbation and flu A  Subjective: Patient lying in bed.  Feels weak.  Continues with chills.  Also with cough.  Denies any chest pain.  Objective: Vitals:   07/28/23 0757 07/28/23 0814 07/28/23 1323 07/28/23 1341  BP:  120/80 136/74   Pulse:  (!) 45 66   Resp:  16 20   Temp:  (!) 97.4 F (36.3 C) 97.7 F (36.5 C)   TempSrc:  Oral Oral   SpO2: 94% 98% 96% 92%  Weight:      Height:        Intake/Output Summary (Last 24 hours) at 07/28/2023 1535 Last data filed at 07/27/2023 2356 Gross per 24 hour  Intake 547.39 ml  Output --  Net 547.39 ml   Filed Weights   07/27/23 1400  Weight: 97.4 kg   Body mass index is 38.05 kg/m.  Gen: 63 y.o. female in no apparent distress.  Nontoxic Pulm: Non-labored breathing.  With some rhonchi bilateral bases CV: Regular rate and rhythm. No murmur, rub, or gallop. No JVD GI: Abdomen soft, non-tender, non-distended, with normoactive bowel sounds. No organomegaly or masses felt. Ext: Warm, no deformities, no pedal edema Skin: No rashes, lesions no ulcers Neuro: Alert and oriented. No focal neurological deficits. Psych: Calm  Judgement and insight appear normal. Mood & affect appropriate.     I have personally reviewed the following  labs and images: CBC: Recent Labs  Lab 07/27/23 1420 07/28/23 0104  WBC 3.8* 6.6  NEUTROABS 2.4 5.8  HGB 14.1 12.9  HCT 46.4* 41.3  MCV 93.4 92.8  PLT 178 173   BMP &GFR Recent Labs  Lab 07/27/23 1420 07/28/23 0104 07/28/23 0500  NA 139  --  137  K 3.7  --  4.0  CL 105  --  105  CO2 22  --  20*  GLUCOSE 104*  --  144*  BUN 25*  --  32*  CREATININE 1.93* 2.20* 2.19*  CALCIUM  8.8*  --   8.8*   Estimated Creatinine Clearance: 30 mL/min (A) (by C-G formula based on SCr of 2.19 mg/dL (H)). Liver & Pancreas: Recent Labs  Lab 07/27/23 1420  AST 23  ALT 12  ALKPHOS 81  BILITOT 0.7  PROT 7.2  ALBUMIN 3.5   No results for input(s): LIPASE, AMYLASE in the last 168 hours. No results for input(s): AMMONIA in the last 168 hours. Diabetic: No results for input(s): HGBA1C in the last 72 hours. No results for input(s): GLUCAP in the last 168 hours. Cardiac Enzymes: No results for input(s): CKTOTAL, CKMB, CKMBINDEX, TROPONINI in the last 168 hours. No results for input(s): PROBNP in the last 8760 hours. Coagulation Profile: No results for input(s): INR, PROTIME in the last 168 hours. Thyroid  Function Tests: No results for input(s): TSH, T4TOTAL, FREET4, T3FREE, THYROIDAB in the last 72 hours. Lipid Profile: No results for input(s): CHOL, HDL, LDLCALC, TRIG, CHOLHDL, LDLDIRECT in the last 72 hours. Anemia Panel: No results for input(s): VITAMINB12, FOLATE, FERRITIN, TIBC, IRON, RETICCTPCT in the last 72 hours. Urine analysis:    Component Value Date/Time   COLORURINE YELLOW 12/22/2017 0808   APPEARANCEUR CLEAR 12/22/2017 0808   LABSPEC 1.014 12/22/2017 0808   PHURINE 5.0 12/22/2017 0808   GLUCOSEU NEGATIVE 12/22/2017 0808   HGBUR NEGATIVE 12/22/2017 0808   BILIRUBINUR NEGATIVE 12/22/2017 0808   KETONESUR NEGATIVE 12/22/2017 0808   PROTEINUR 30 (A) 12/22/2017 0808   UROBILINOGEN 0.2 12/02/2011 1924   NITRITE NEGATIVE 12/22/2017 0808   LEUKOCYTESUR TRACE (A) 12/22/2017 0808   Sepsis Labs: Invalid input(s): PROCALCITONIN, LACTICIDVEN  Microbiology: Recent Results (from the past 240 hours)  Resp panel by RT-PCR (RSV, Flu A&B, Covid) Anterior Nasal Swab     Status: Abnormal   Collection Time: 07/27/23  2:22 PM   Specimen: Anterior Nasal Swab  Result Value Ref Range Status   SARS Coronavirus 2 by RT PCR  NEGATIVE NEGATIVE Final    Comment: (NOTE) SARS-CoV-2 target nucleic acids are NOT DETECTED.  The SARS-CoV-2 RNA is generally detectable in upper respiratory specimens during the acute phase of infection. The lowest concentration of SARS-CoV-2 viral copies this assay can detect is 138 copies/mL. A negative result does not preclude SARS-Cov-2 infection and should not be used as the sole basis for treatment or other patient management decisions. A negative result may occur with  improper specimen collection/handling, submission of specimen other than nasopharyngeal swab, presence of viral mutation(s) within the areas targeted by this assay, and inadequate number of viral copies(<138 copies/mL). A negative result must be combined with clinical observations, patient history, and epidemiological information. The expected result is Negative.  Fact Sheet for Patients:  bloggercourse.com  Fact Sheet for Healthcare Providers:  seriousbroker.it  This test is no t yet approved or cleared by the [place]  FDA and  has been authorized for detection and/or diagnosis of SARS-CoV-2 by FDA under an Emergency Use  Authorization (EUA). This EUA will remain  in effect (meaning this test can be used) for the duration of the COVID-19 declaration under Section 564(b)(1) of the Act, 21 U.S.C.section 360bbb-3(b)(1), unless the authorization is terminated  or revoked sooner.       Influenza A by PCR POSITIVE (A) NEGATIVE Final   Influenza B by PCR NEGATIVE NEGATIVE Final    Comment: (NOTE) The Xpert Xpress SARS-CoV-2/FLU/RSV plus assay is intended as an aid in the diagnosis of influenza from Nasopharyngeal swab specimens and should not be used as a sole basis for treatment. Nasal washings and aspirates are unacceptable for Xpert Xpress SARS-CoV-2/FLU/RSV testing.  Fact Sheet for Patients: bloggercourse.com  Fact Sheet for  Healthcare Providers: seriousbroker.it  This test is not yet approved or cleared by the [place]  FDA and has been authorized for detection and/or diagnosis of SARS-CoV-2 by FDA under an Emergency Use Authorization (EUA). This EUA will remain in effect (meaning this test can be used) for the duration of the COVID-19 declaration under Section 564(b)(1) of the Act, 21 U.S.C. section 360bbb-3(b)(1), unless the authorization is terminated or revoked.     Resp Syncytial Virus by PCR NEGATIVE NEGATIVE Final    Comment: (NOTE) Fact Sheet for Patients: bloggercourse.com  Fact Sheet for Healthcare Providers: seriousbroker.it  This test is not yet approved or cleared by the [place]  FDA and has been authorized for detection and/or diagnosis of SARS-CoV-2 by FDA under an Emergency Use Authorization (EUA). This EUA will remain in effect (meaning this test can be used) for the duration of the COVID-19 declaration under Section 564(b)(1) of the Act, 21 U.S.C. section 360bbb-3(b)(1), unless the authorization is terminated or revoked.  Performed at [facility], [address] [place], [place] [address]     Radiology Studies: No results found.  Scheduled Meds:  amLODipine   10 mg Oral Daily   atorvastatin   80 mg Oral Daily   [START ON 07/29/2023] azithromycin   500 mg Oral Daily   benzonatate   100 mg Oral TID   buPROPion   150 mg Oral Daily   feeding supplement  237 mL Oral TID BM   FLUoxetine   20 mg Oral Daily   heparin   5,000 Units Subcutaneous Q8H   hydrALAZINE   100 mg Oral TID   ipratropium  0.5 mg Nebulization Q6H   losartan   50 mg Oral Daily   melatonin  5 mg Oral QHS   mometasone -formoterol   2 puff Inhalation BID   nicotine   14 mg Transdermal Daily   oseltamivir   30 mg Oral BID   pantoprazole   40 mg Oral Daily   [START ON 07/29/2023] predniSONE   40 mg Oral Q breakfast    Continuous Infusions:   LOS: 0 days   35 minutes with more than 50% spent in reviewing records, counseling patient/family and coordinating care.  Reyes VEAR Gaw, MD [facility] [email or web address] 07/28/2023, 3:35 PM

## 2023-07-28 NOTE — Discharge Instructions (Signed)
 [facility] [address]  [place], [place] [address]  [phone] [facility]'s mission is to advocate for and provide resources to people with low to moderate incomes and those with special needs to secure or retain fair, safe, healthy and affordable housing.

## 2023-07-29 DIAGNOSIS — J9601 Acute respiratory failure with hypoxia: Secondary | ICD-10-CM | POA: Diagnosis not present

## 2023-07-29 LAB — BASIC METABOLIC PANEL
Anion gap: 11 (ref 5–15)
BUN: 37 mg/dL — ABNORMAL HIGH (ref 8–23)
CO2: 20 mmol/L — ABNORMAL LOW (ref 22–32)
Calcium: 8.9 mg/dL (ref 8.9–10.3)
Chloride: 105 mmol/L (ref 98–111)
Creatinine, Ser: 2.31 mg/dL — ABNORMAL HIGH (ref 0.44–1.00)
GFR, Estimated: 23 mL/min — ABNORMAL LOW (ref >60)
Glucose, Bld: 125 mg/dL — ABNORMAL HIGH (ref 70–99)
Potassium: 3.5 mmol/L (ref 3.5–5.1)
Sodium: 136 mmol/L (ref 135–145)

## 2023-07-29 LAB — URINALYSIS, ROUTINE W REFLEX MICROSCOPIC
Bilirubin Urine: NEGATIVE
Glucose, UA: 50 mg/dL — AB
Ketones, ur: NEGATIVE mg/dL
Nitrite: NEGATIVE
Protein, ur: 30 mg/dL — AB
RBC / HPF: >50 RBC/hpf (ref 0–5)
Specific Gravity, Urine: 1.021 (ref 1.005–1.030)
WBC, UA: >50 WBC/hpf (ref 0–5)
pH: 5 (ref 5.0–8.0)

## 2023-07-29 MED ORDER — IPRATROPIUM-ALBUTEROL 0.5-2.5 (3) MG/3ML IN SOLN: 3.0000 mL | Freq: Two times a day (BID) | RESPIRATORY_TRACT | Status: DC

## 2023-07-29 MED ORDER — IPRATROPIUM-ALBUTEROL 0.5-2.5 (3) MG/3ML IN SOLN
3.0000 mL | Freq: Three times a day (TID) | RESPIRATORY_TRACT | Status: DC
  Administered 2023-07-29 – 2023-07-30 (×3): 3 mL via RESPIRATORY_TRACT
  Filled 2023-07-29 (×3): qty 3

## 2023-07-29 MED ORDER — OSELTAMIVIR PHOSPHATE 30 MG PO CAPS
30.0000 mg | ORAL_CAPSULE | Freq: Every day | ORAL | Status: DC
  Administered 2023-07-30 – 2023-07-31 (×2): 30 mg via ORAL
  Filled 2023-07-29 (×2): qty 1

## 2023-07-29 MED ORDER — DIPHENHYDRAMINE HCL 25 MG PO CAPS
25.0000 mg | ORAL_CAPSULE | Freq: Once | ORAL | Status: AC
  Administered 2023-07-29: 25 mg via ORAL
  Filled 2023-07-29: qty 1

## 2023-07-29 NOTE — Progress Notes (Signed)
 PHARMACY NOTE -  ANTIBIOTIC RENAL DOSE ADJUSTMENT   Request received for Pharmacy to assist with antibiotic renal dose adjustment.  Patient has been initiated on Tamiflu  for Flu. SCr 2.31, estimated CrCl 28 ml/min  Decrease Tamiflu  to 30mg  once daily for 3 more days.  Maxcine Strong S. Casimir, PharmD, BCPS Clinical Staff Pharmacist [email or web address]

## 2023-07-29 NOTE — Progress Notes (Signed)
 PROGRESS NOTE  Angela Gardner  FMW:[id number] DOB: 11-14-1960 DOA: 07/27/2023 PCP: Delbert Clam, MD  [facility]ult[facility]  Brief Narrative: This is a 63 year old female past medical history significant for COPD, tobacco use, endometrial cancer [on chemotherapy and radiation therapy], HLD, depression, CKD stage III.  She presented f to the ED on 07/27/2023 secondary to bodyaches, weakness, poor appetite.  Found to be hypoxic and subsequently diagnosed with flu A.  Assessment & Plan: Influenza A  Acute respiratory failure with hypoxia -Still feels fairly exhausted.  Also with persistent cough and ongoing chills.  Chills better than day of admission but still present. -Tamiflu  ordered.  Adjusted for decreased renal clearance today. -Tessalon  Perles scheduled.  Robitussin as needed -Oxygen to keep sats between 88%.  O2 sats better since admission.  On room air on my exam today.  Lower abdominal cramping: -Also with a couple episodes of incontinence. -This is new for her. -Reports that she was diagnosed with UTI this past Monday and given what sounds to be fosfomycin by her description to treat. -Will repeat urinalysis today in light of ongoing UTI type symptoms. -No fevers.  -Understand that urinalysis may not be as helpful in patient who already received antibiotics within the week.    Acute COPD exacerbation -COPD order set initiated.  Some wheezing but lungs actually sound pretty good this morning on exam. -Continue IV Solu-Medrol  ordered -Atrovent  scheduled.  DuoNebs as needed -Nebulizers as needed -Azithromycin  ordered -Dulera  daily   Tobacco use -Nicotine  patch ordered    Elevated troponin -Troponin minimally elevated but flat.  She actually denies any chest pain. -Highly unlikely to be ACS on top of flu and COPD exacerbation but will continue to ask about chest pain.  No ischemic changes noted on EKG   Chronic kidney disease (CKD), stage III (moderate) (HCC) -Acute kidney injury on  top of her chronic CKD. -GFR now hovering between 25 and 29.  Continue to monitor.  Hydrate    HTN -Norvasc , hydralazine , Cozaar  resumed    Depression -Prozac , Wellbutrin  resumed    Hyperlipemia -Atorvastatin  resumed    Endometrial cancer (HCC) on chemo and radiation tx -Per oncologist  Nutrition Problem: Inadequate oral intake Etiology: cancer and cancer related treatments, poor appetite, mouth pain, other (see comment) ([place]) Signs/Symptoms: energy intake < or equal to 50% for > or equal to 5 days Interventions: Refer to RD note for recommendations, Ensure Enlive (each supplement provides 350kcal and 20 grams of protein)   DVT prophylaxis:  heparin  injection 5,000 Units Start: 07/28/23 0600  Code Status:   Code Status: Full Code Level of care: Med-Surg Status is: Inpatient Remains inpatient appropriate because: Hypoxia in light of COPD exacerbation and flu A  Subjective: Patient lying in bed.  Feels weak.  Continues with chills.  Also with cough.  Denies any chest pain.  Overall feels about the same as yesterday.  Does report decreased appetite since admission but no nausea or vomiting.  New symptoms today are some urinary incontinence as well as lower abdominal cramping.  Also with a few episodes of diarrhea.  Objective: Vitals:   07/28/23 2109 07/29/23 0504 07/29/23 0902 07/29/23 1156  BP:  125/81  107/61  Pulse:  (!) 53  61  Resp:  18  16  Temp:  97.6 F (36.4 C)  97.8 F (36.6 C)  TempSrc:      SpO2: 93% 95% 95% 96%  Weight:      Height:        Intake/Output Summary (Last  24 hours) at 07/29/2023 1525 Last data filed at 07/29/2023 0730 Gross per 24 hour  Intake 340 ml  Output --  Net 340 ml   Filed Weights   07/27/23 1400  Weight: 97.4 kg   Body mass index is 38.05 kg/m.  Gen: 63 y.o. female in no apparent distress.  Nontoxic Pulm: Non-labored breathing.  With some rhonchi bilateral bases CV: Regular rate and rhythm. No murmur, rub, or gallop. No  JVD GI: Abdomen soft, non-tender, non-distended, with normoactive bowel sounds. No organomegaly or masses felt. Ext: Warm, no deformities, no pedal edema Skin: No rashes, lesions no ulcers Neuro: Alert and oriented. No focal neurological deficits. Psych: Calm  Judgement and insight appear normal. Mood & affect appropriate.     I have personally reviewed the following labs and images: CBC: Recent Labs  Lab 07/27/23 1420 07/28/23 0104  WBC 3.8* 6.6  NEUTROABS 2.4 5.8  HGB 14.1 12.9  HCT 46.4* 41.3  MCV 93.4 92.8  PLT 178 173   BMP &GFR Recent Labs  Lab 07/27/23 1420 07/28/23 0104 07/28/23 0500 07/29/23 0516  NA 139  --  137 136  K 3.7  --  4.0 3.5  CL 105  --  105 105  CO2 22  --  20* 20*  GLUCOSE 104*  --  144* 125*  BUN 25*  --  32* 37*  CREATININE 1.93* 2.20* 2.19* 2.31*  CALCIUM  8.8*  --  8.8* 8.9   Estimated Creatinine Clearance: 28.4 mL/min (A) (by C-G formula based on SCr of 2.31 mg/dL (H)). Liver & Pancreas: Recent Labs  Lab 07/27/23 1420  AST 23  ALT 12  ALKPHOS 81  BILITOT 0.7  PROT 7.2  ALBUMIN 3.5   No results for input(s): LIPASE, AMYLASE in the last 168 hours. No results for input(s): AMMONIA in the last 168 hours. Diabetic: No results for input(s): HGBA1C in the last 72 hours. No results for input(s): GLUCAP in the last 168 hours. Cardiac Enzymes: No results for input(s): CKTOTAL, CKMB, CKMBINDEX, TROPONINI in the last 168 hours. No results for input(s): PROBNP in the last 8760 hours. Coagulation Profile: No results for input(s): INR, PROTIME in the last 168 hours. Thyroid  Function Tests: No results for input(s): TSH, T4TOTAL, FREET4, T3FREE, THYROIDAB in the last 72 hours. Lipid Profile: No results for input(s): CHOL, HDL, LDLCALC, TRIG, CHOLHDL, LDLDIRECT in the last 72 hours. Anemia Panel: No results for input(s): VITAMINB12, FOLATE, FERRITIN, TIBC, IRON, RETICCTPCT in the last 72  hours. Urine analysis:    Component Value Date/Time   COLORURINE YELLOW 12/22/2017 0808   APPEARANCEUR CLEAR 12/22/2017 0808   LABSPEC 1.014 12/22/2017 0808   PHURINE 5.0 12/22/2017 0808   GLUCOSEU NEGATIVE 12/22/2017 0808   HGBUR NEGATIVE 12/22/2017 0808   BILIRUBINUR NEGATIVE 12/22/2017 0808   KETONESUR NEGATIVE 12/22/2017 0808   PROTEINUR 30 (A) 12/22/2017 0808   UROBILINOGEN 0.2 12/02/2011 1924   NITRITE NEGATIVE 12/22/2017 0808   LEUKOCYTESUR TRACE (A) 12/22/2017 0808   Sepsis Labs: Invalid input(s): PROCALCITONIN, LACTICIDVEN  Microbiology: Recent Results (from the past 240 hours)  Resp panel by RT-PCR (RSV, Flu A&B, Covid) Anterior Nasal Swab     Status: Abnormal   Collection Time: 07/27/23  2:22 PM   Specimen: Anterior Nasal Swab  Result Value Ref Range Status   SARS Coronavirus 2 by RT PCR NEGATIVE NEGATIVE Final    Comment: (NOTE) SARS-CoV-2 target nucleic acids are NOT DETECTED.  The SARS-CoV-2 RNA is generally detectable in upper respiratory specimens  during the acute phase of infection. The lowest concentration of SARS-CoV-2 viral copies this assay can detect is 138 copies/mL. A negative result does not preclude SARS-Cov-2 infection and should not be used as the sole basis for treatment or other patient management decisions. A negative result may occur with  improper specimen collection/handling, submission of specimen other than nasopharyngeal swab, presence of viral mutation(s) within the areas targeted by this assay, and inadequate number of viral copies(<138 copies/mL). A negative result must be combined with clinical observations, patient history, and epidemiological information. The expected result is Negative.  Fact Sheet for Patients:  bloggercourse.com  Fact Sheet for Healthcare Providers:  seriousbroker.it  This test is no t yet approved or cleared by the [place]  FDA and  has been  authorized for detection and/or diagnosis of SARS-CoV-2 by FDA under an Emergency Use Authorization (EUA). This EUA will remain  in effect (meaning this test can be used) for the duration of the COVID-19 declaration under Section 564(b)(1) of the Act, 21 U.S.C.section 360bbb-3(b)(1), unless the authorization is terminated  or revoked sooner.       Influenza A by PCR POSITIVE (A) NEGATIVE Final   Influenza B by PCR NEGATIVE NEGATIVE Final    Comment: (NOTE) The Xpert Xpress SARS-CoV-2/FLU/RSV plus assay is intended as an aid in the diagnosis of influenza from Nasopharyngeal swab specimens and should not be used as a sole basis for treatment. Nasal washings and aspirates are unacceptable for Xpert Xpress SARS-CoV-2/FLU/RSV testing.  Fact Sheet for Patients: bloggercourse.com  Fact Sheet for Healthcare Providers: seriousbroker.it  This test is not yet approved or cleared by the [place]  FDA and has been authorized for detection and/or diagnosis of SARS-CoV-2 by FDA under an Emergency Use Authorization (EUA). This EUA will remain in effect (meaning this test can be used) for the duration of the COVID-19 declaration under Section 564(b)(1) of the Act, 21 U.S.C. section 360bbb-3(b)(1), unless the authorization is terminated or revoked.     Resp Syncytial Virus by PCR NEGATIVE NEGATIVE Final    Comment: (NOTE) Fact Sheet for Patients: bloggercourse.com  Fact Sheet for Healthcare Providers: seriousbroker.it  This test is not yet approved or cleared by the [place]  FDA and has been authorized for detection and/or diagnosis of SARS-CoV-2 by FDA under an Emergency Use Authorization (EUA). This EUA will remain in effect (meaning this test can be used) for the duration of the COVID-19 declaration under Section 564(b)(1) of the Act, 21 U.S.C. section 360bbb-3(b)(1), unless the  authorization is terminated or revoked.  Performed at [facility], [address] [place], [place] [address]     Radiology Studies: No results found.  Scheduled Meds:  amLODipine   10 mg Oral Daily   atorvastatin   80 mg Oral Daily   azithromycin   500 mg Oral Daily   benzonatate   100 mg Oral TID   buPROPion   150 mg Oral Daily   feeding supplement  237 mL Oral TID BM   FLUoxetine   20 mg Oral Daily   heparin   5,000 Units Subcutaneous Q8H   hydrALAZINE   100 mg Oral TID   ipratropium-albuterol   3 mL Nebulization TID   losartan   50 mg Oral Daily   melatonin  5 mg Oral QHS   mometasone -formoterol   2 puff Inhalation BID   nicotine   14 mg Transdermal Daily   [START ON 07/30/2023] oseltamivir   30 mg Oral Daily   pantoprazole   40 mg Oral Daily   predniSONE   40 mg Oral  Q breakfast   Continuous Infusions:   LOS: 1 day   35 minutes with more than 50% spent in reviewing records, counseling patient/family and coordinating care.  Reyes VEAR Gaw, MD [facility] [email or web address] 07/29/2023, 3:25 PM

## 2023-07-29 NOTE — Plan of Care (Signed)

## 2023-07-30 DIAGNOSIS — J9601 Acute respiratory failure with hypoxia: Secondary | ICD-10-CM | POA: Diagnosis not present

## 2023-07-30 LAB — BASIC METABOLIC PANEL
Anion gap: 11 (ref 5–15)
BUN: 37 mg/dL — ABNORMAL HIGH (ref 8–23)
CO2: 20 mmol/L — ABNORMAL LOW (ref 22–32)
Calcium: 8.7 mg/dL — ABNORMAL LOW (ref 8.9–10.3)
Chloride: 107 mmol/L (ref 98–111)
Creatinine, Ser: 2.16 mg/dL — ABNORMAL HIGH (ref 0.44–1.00)
GFR, Estimated: 25 mL/min — ABNORMAL LOW (ref >60)
Glucose, Bld: 114 mg/dL — ABNORMAL HIGH (ref 70–99)
Potassium: 3.3 mmol/L — ABNORMAL LOW (ref 3.5–5.1)
Sodium: 138 mmol/L (ref 135–145)

## 2023-07-30 LAB — CBC
HCT: 40.3 % (ref 36.0–46.0)
Hemoglobin: 12.7 g/dL (ref 12.0–15.0)
MCH: 28.5 pg (ref 26.0–34.0)
MCHC: 31.5 g/dL (ref 30.0–36.0)
MCV: 90.6 fL (ref 80.0–100.0)
Platelets: 207 10*3/uL (ref 150–400)
RBC: 4.45 MIL/uL (ref 3.87–5.11)
RDW: 15.4 % (ref 11.5–15.5)
WBC: 4.6 10*3/uL (ref 4.0–10.5)
nRBC: 0 % (ref 0.0–0.2)

## 2023-07-30 MED ORDER — POTASSIUM CHLORIDE CRYS ER 20 MEQ PO TBCR
40.0000 meq | EXTENDED_RELEASE_TABLET | Freq: Once | ORAL | Status: AC
  Administered 2023-07-30: 40 meq via ORAL
  Filled 2023-07-30: qty 2

## 2023-07-30 MED ORDER — SODIUM CHLORIDE 0.9 % IV SOLN
1.0000 g | INTRAVENOUS | Status: DC
  Administered 2023-07-30: 1 g via INTRAVENOUS
  Filled 2023-07-30: qty 10

## 2023-07-30 MED ORDER — IPRATROPIUM-ALBUTEROL 0.5-2.5 (3) MG/3ML IN SOLN
3.0000 mL | Freq: Two times a day (BID) | RESPIRATORY_TRACT | Status: DC
Start: 2023-07-30 — End: 2023-07-31
  Administered 2023-07-30 – 2023-07-31 (×2): 3 mL via RESPIRATORY_TRACT
  Filled 2023-07-30 (×2): qty 3

## 2023-07-30 MED ORDER — DIPHENHYDRAMINE HCL 25 MG PO CAPS
25.0000 mg | ORAL_CAPSULE | Freq: Once | ORAL | Status: AC
  Administered 2023-07-30: 25 mg via ORAL
  Filled 2023-07-30: qty 1

## 2023-07-30 NOTE — Plan of Care (Signed)

## 2023-07-30 NOTE — Progress Notes (Signed)
 PROGRESS NOTE  Angela Gardner  FMW:[id number] DOB: [birth year] DOA: 07/27/2023 PCP: Delbert Clam, MD  [facility]  Brief Narrative: This is a 63 year old female past medical history significant for COPD, tobacco use, endometrial cancer [on chemotherapy and radiation therapy], HLD, depression, CKD stage III.  She presented f to the ED on 07/27/2023 secondary to bodyaches, weakness, poor appetite.  Found to be hypoxic and subsequently diagnosed with flu A.  Slowly improving.  Assessment & Plan: Influenza A  Acute respiratory failure with hypoxia -Still feels fairly exhausted.  Chills now resolved and cough is also better. -Tamiflu  ordered.  Adjusted for decreased renal clearance today. -Tessalon  Perles scheduled.  Robitussin as needed -Oxygen to keep sats between 88%.  O2 sats better since admission.  On room air on my exam today.  UTI:: -New issue 2/8.  With new onset lower abdominal cramping and also with a couple episodes of incontinence, which is not her baseline.  This morning she tells me she saw gross blood in her urine. -Reports that she was diagnosed with UTI this past Monday and given what sounds to be fosfomycin by her description to treat. -UA with ongoing concerns for UTI.  Urine culture is pending although she has had antibiotics in the past weeks and may not be as helpful. -Starting ceftriaxone  today based on symptoms and urinalysis.  She is on azithromycin  which was started on admission for COPD exacerbation, caused by influenza A. -Of note, recommend as patient has history of endometrial cancer.  If lower abdominal cramping persist despite antibiotics consider CT abdomen imaging.    Acute COPD exacerbation -COPD order set initiated.  Some wheezing but lungs actually sound pretty good this morning on exam. -Switch to p.o. prednisone  2/8. -Atrovent  scheduled.  DuoNebs as needed -Nebulizers as needed -Azithromycin  ordered -Dulera  daily   Tobacco use -Nicotine  patch  ordered    Elevated troponin -Troponin minimally elevated but flat.  She actually denies any chest pain. -Highly unlikely to be ACS on top of flu and COPD exacerbation but will continue to ask about chest pain.  No ischemic changes noted on EKG   Chronic kidney disease (CKD), stage III (moderate) (HCC) -Acute kidney injury on top of her chronic CKD. -GFR now hovering between 25 and 29.  Continue to monitor.  Hydrate    HTN -Norvasc , hydralazine , Cozaar  resumed    Depression -Prozac , Wellbutrin  resumed    Hyperlipemia -Atorvastatin  resumed    Endometrial cancer (HCC) on chemo and radiation tx -Per oncologist  Nutrition Problem: Inadequate oral intake Etiology: cancer and cancer related treatments, poor appetite, mouth pain, other (see comment) ([place]) Signs/Symptoms: energy intake < or equal to 50% for > or equal to 5 days Interventions: Refer to RD note for recommendations, Ensure Enlive (each supplement provides 350kcal and 20 grams of protein)   DVT prophylaxis:  heparin  injection 5,000 Units Start: 07/28/23 0600  Code Status:   Code Status: Full Code Level of care: Med-Surg Status is: Inpatient Remains inpatient appropriate because: Hypoxia in light of COPD exacerbation and flu A  Subjective: Patient lying in bed.  Feels weak.  Chills and cough are getting better.  Main issue now is the abdominal cramping and urinary urgency symptoms.  Also has report of gross blood in urine which is new for her.    Objective: Vitals:   07/29/23 2010 07/30/23 0448 07/30/23 0814 07/30/23 1248  BP: 133/70 138/77  124/70  Pulse: 65 (!) 53  62  Resp: 18 18  16   Temp: ROLLEN)  97.5 F (36.4 C) 97.9 F (36.6 C)  97.7 F (36.5 C)  TempSrc: Oral Oral  Oral  SpO2: 97% 95% 96% 95%  Weight:      Height:        Intake/Output Summary (Last 24 hours) at 07/30/2023 1628 Last data filed at 07/29/2023 2230 Gross per 24 hour  Intake 300 ml  Output 200 ml  Net 100 ml   Filed Weights   07/27/23  1400  Weight: 97.4 kg   Body mass index is 38.05 kg/m.  Gen: 63 y.o. female in no apparent distress.  Nontoxic Pulm: Non-labored breathing.  With some rhonchi bilateral bases CV: Regular rate and rhythm. No murmur, rub, or gallop. No JVD GI: Abdomen soft, non-tender, non-distended, with normoactive bowel sounds. No organomegaly or masses felt. Ext: Warm, no deformities, no pedal edema Skin: No rashes, lesions no ulcers Neuro: Alert and oriented. No focal neurological deficits. Psych: Calm  Judgement and insight appear normal. Mood & affect appropriate.     I have personally reviewed the following labs and images: CBC: Recent Labs  Lab 07/27/23 1420 07/28/23 0104 07/30/23 0511  WBC 3.8* 6.6 4.6  NEUTROABS 2.4 5.8  --   HGB 14.1 12.9 12.7  HCT 46.4* 41.3 40.3  MCV 93.4 92.8 90.6  PLT 178 173 207   BMP &GFR Recent Labs  Lab 07/27/23 1420 07/28/23 0104 07/28/23 0500 07/29/23 0516 07/30/23 0511  NA 139  --  137 136 138  K 3.7  --  4.0 3.5 3.3*  CL 105  --  105 105 107  CO2 22  --  20* 20* 20*  GLUCOSE 104*  --  144* 125* 114*  BUN 25*  --  32* 37* 37*  CREATININE 1.93* 2.20* 2.19* 2.31* 2.16*  CALCIUM  8.8*  --  8.8* 8.9 8.7*   Estimated Creatinine Clearance: 30.4 mL/min (A) (by C-G formula based on SCr of 2.16 mg/dL (H)). Liver & Pancreas: Recent Labs  Lab 07/27/23 1420  AST 23  ALT 12  ALKPHOS 81  BILITOT 0.7  PROT 7.2  ALBUMIN 3.5   No results for input(s): LIPASE, AMYLASE in the last 168 hours. No results for input(s): AMMONIA in the last 168 hours. Diabetic: No results for input(s): HGBA1C in the last 72 hours. No results for input(s): GLUCAP in the last 168 hours. Cardiac Enzymes: No results for input(s): CKTOTAL, CKMB, CKMBINDEX, TROPONINI in the last 168 hours. No results for input(s): PROBNP in the last 8760 hours. Coagulation Profile: No results for input(s): INR, PROTIME in the last 168 hours. Thyroid  Function  Tests: No results for input(s): TSH, T4TOTAL, FREET4, T3FREE, THYROIDAB in the last 72 hours. Lipid Profile: No results for input(s): CHOL, HDL, LDLCALC, TRIG, CHOLHDL, LDLDIRECT in the last 72 hours. Anemia Panel: No results for input(s): VITAMINB12, FOLATE, FERRITIN, TIBC, IRON, RETICCTPCT in the last 72 hours. Urine analysis:    Component Value Date/Time   COLORURINE YELLOW 07/29/2023 1653   APPEARANCEUR CLOUDY (A) 07/29/2023 1653   LABSPEC 1.021 07/29/2023 1653   PHURINE 5.0 07/29/2023 1653   GLUCOSEU 50 (A) 07/29/2023 1653   HGBUR MODERATE (A) 07/29/2023 1653   BILIRUBINUR NEGATIVE 07/29/2023 1653   KETONESUR NEGATIVE 07/29/2023 1653   PROTEINUR 30 (A) 07/29/2023 1653   UROBILINOGEN 0.2 12/02/2011 1924   NITRITE NEGATIVE 07/29/2023 1653   LEUKOCYTESUR LARGE (A) 07/29/2023 1653   Sepsis Labs: Invalid input(s): PROCALCITONIN, LACTICIDVEN  Microbiology: Recent Results (from the past 240 hours)  Resp panel by RT-PCR (  RSV, Flu A&B, Covid) Anterior Nasal Swab     Status: Abnormal   Collection Time: 07/27/23  2:22 PM   Specimen: Anterior Nasal Swab  Result Value Ref Range Status   SARS Coronavirus 2 by RT PCR NEGATIVE NEGATIVE Final    Comment: (NOTE) SARS-CoV-2 target nucleic acids are NOT DETECTED.  The SARS-CoV-2 RNA is generally detectable in upper respiratory specimens during the acute phase of infection. The lowest concentration of SARS-CoV-2 viral copies this assay can detect is 138 copies/mL. A negative result does not preclude SARS-Cov-2 infection and should not be used as the sole basis for treatment or other patient management decisions. A negative result may occur with  improper specimen collection/handling, submission of specimen other than nasopharyngeal swab, presence of viral mutation(s) within the areas targeted by this assay, and inadequate number of viral copies(<138 copies/mL). A negative result must be combined  with clinical observations, patient history, and epidemiological information. The expected result is Negative.  Fact Sheet for Patients:  bloggercourse.com  Fact Sheet for Healthcare Providers:  seriousbroker.it  This test is no t yet approved or cleared by the [place]  FDA and  has been authorized for detection and/or diagnosis of SARS-CoV-2 by FDA under an Emergency Use Authorization (EUA). This EUA will remain  in effect (meaning this test can be used) for the duration of the COVID-19 declaration under Section 564(b)(1) of the Act, 21 U.S.C.section 360bbb-3(b)(1), unless the authorization is terminated  or revoked sooner.       Influenza A by PCR POSITIVE (A) NEGATIVE Final   Influenza B by PCR NEGATIVE NEGATIVE Final    Comment: (NOTE) The Xpert Xpress SARS-CoV-2/FLU/RSV plus assay is intended as an aid in the diagnosis of influenza from Nasopharyngeal swab specimens and should not be used as a sole basis for treatment. Nasal washings and aspirates are unacceptable for Xpert Xpress SARS-CoV-2/FLU/RSV testing.  Fact Sheet for Patients: bloggercourse.com  Fact Sheet for Healthcare Providers: seriousbroker.it  This test is not yet approved or cleared by the [place]  FDA and has been authorized for detection and/or diagnosis of SARS-CoV-2 by FDA under an Emergency Use Authorization (EUA). This EUA will remain in effect (meaning this test can be used) for the duration of the COVID-19 declaration under Section 564(b)(1) of the Act, 21 U.S.C. section 360bbb-3(b)(1), unless the authorization is terminated or revoked.     Resp Syncytial Virus by PCR NEGATIVE NEGATIVE Final    Comment: (NOTE) Fact Sheet for Patients: bloggercourse.com  Fact Sheet for Healthcare Providers: seriousbroker.it  This test is not yet  approved or cleared by the [place]  FDA and has been authorized for detection and/or diagnosis of SARS-CoV-2 by FDA under an Emergency Use Authorization (EUA). This EUA will remain in effect (meaning this test can be used) for the duration of the COVID-19 declaration under Section 564(b)(1) of the Act, 21 U.S.C. section 360bbb-3(b)(1), unless the authorization is terminated or revoked.  Performed at [facility], [address], [place], [place] [address]     Radiology Studies: No results found.  Scheduled Meds:  amLODipine   10 mg Oral Daily   atorvastatin   80 mg Oral Daily   azithromycin   500 mg Oral Daily   benzonatate   100 mg Oral TID   buPROPion   150 mg Oral Daily   feeding supplement  237 mL Oral TID BM   FLUoxetine   20 mg Oral Daily   heparin   5,000 Units Subcutaneous Q8H   hydrALAZINE   100 mg Oral  TID   ipratropium-albuterol   3 mL Nebulization BID   losartan   50 mg Oral Daily   melatonin  5 mg Oral QHS   mometasone -formoterol   2 puff Inhalation BID   nicotine   14 mg Transdermal Daily   oseltamivir   30 mg Oral Daily   pantoprazole   40 mg Oral Daily   predniSONE   40 mg Oral Q breakfast   Continuous Infusions:   LOS: 2 days   35 minutes with more than 50% spent in reviewing records, counseling patient/family and coordinating care.  Reyes VEAR Gaw, MD [facility] [email or web address] 07/30/2023, 4:28 PM

## 2023-07-30 NOTE — Evaluation (Signed)
 Occupational Therapy Evaluation Patient Details Name: Angela Gardner MRN: [id number] DOB: 08-03-1960 Today's Date: 07/30/2023   History of Present Illness Pt is a 63 y/o F admitted on 07/27/23 after presenting with c/o SOB, wheezing, coughing, fevers & chills. Pt tested positive for Influenza A, COPD exacerbation. PMH: COPD, tobacco use, endometrial CA on chemo & radiation therapy, HLD, depression, CKD 3   Clinical Impression   Pt admitted with the above. Pt currently with functional limitations due to the deficits listed below (see OT Problem List).  Pt will benefit from acute skilled OT to increase their safety and independence with ADL and functional mobility for ADL to facilitate discharge.         If plan is discharge home, recommend the following: A little help with walking and/or transfers;A little help with bathing/dressing/bathroom             Precautions / Restrictions Precautions Precautions: Fall      Mobility Bed Mobility Overal bed mobility: Modified Independent       Supine to sit: Modified independent (Device/Increase time), HOB elevated          Transfers Overall transfer level: Needs assistance Equipment used: None Transfers: Sit to/from Stand Sit to Stand: Supervision                  Balance Overall balance assessment: Needs assistance Sitting-balance support: Feet supported Sitting balance-Leahy Scale: Good     Standing balance support: During functional activity, Bilateral upper extremity supported, Reliant on assistive device for balance Standing balance-Leahy Scale: Fair                             ADL either performed or assessed with clinical judgement   ADL Overall ADL's : Needs assistance/impaired Eating/Feeding: Set up;Sitting   Grooming: Wash/dry hands;Wash/dry face;Sitting   Upper Body Bathing: Minimal assistance;Sitting   Lower Body Bathing: Minimal assistance;Sit to/from stand;Cueing for safety;Cueing for  sequencing   Upper Body Dressing : Set up;Sitting   Lower Body Dressing: Minimal assistance;Sit to/from stand;Cueing for safety;Cueing for sequencing   Toilet Transfer: Minimal assistance;Cueing for safety   Toileting- Clothing Manipulation and Hygiene: Minimal assistance;Sit to/from stand   Tub/ Shower Transfer: Minimal assistance   Functional mobility during ADLs: Minimal assistance General ADL Comments: Pt complaining of hand pain. Issued putty as well as a squeeze ball and instructed to use to provide mobility to fingers.  Pt states she thought this would help.  OT did instruct pt to make a snake with the putty and pt screamed at OT not to saty that word again.  OT reworded instruction to make a long rope with the putty rather than a snake     Vision   Vision Assessment?: No apparent visual deficits            Pertinent Vitals/Pain Pain Assessment Pain Assessment: 0-10 Pain Score: 3  Pain Location: hands- feels like arthritis is bothering her Pain Descriptors / Indicators: Sore Pain Intervention(s): Limited activity within patient's tolerance     Extremity/Trunk Assessment Upper Extremity Assessment Upper Extremity Assessment: Generalized weakness           Communication Communication Communication: No apparent difficulties   Cognition Arousal: Alert Behavior During Therapy: WFL for tasks assessed/performed Overall Cognitive Status: Within Functional Limits for tasks assessed  General Comments: pt is AxOx4 & follows commands well throughout session but at end of session pt holds head & states it's my 6th sense but does not elaborate.                Home Living Family/patient expects to be discharged to:: Private residence Living Arrangements: Other relatives (sister) Available Help at Discharge: Family;Available PRN/intermittently (sister works outside of the home during the day) Type of Home: Mobile  home Home Access: Stairs to enter Secretary/administrator of Steps: 4 Entrance Stairs-Rails: None Home Layout: One level     Bathroom Shower/Tub:  (pt reports she sponge bathes with water  they buy from the store as pt does not have running water  in the home & hasn't for 6 years)         Home Equipment: Rollator (4 wheels);Cane - single point;BSC/3in1          Prior Functioning/Environment               Mobility Comments: Pt reports she can walk to the mailbox & back without AD but limits walking, pt notes she falls all the time but only reports 2 in the past 6 months. ADLs Comments: Pt uses BSC primarily & empties it herself when she needs to, limits walking 2/2 poor floor conditions. Sponge bathes with water  they buy at the store as they do not have running water  in the home & haven't for 6 years. Pt reports she also uses the bathing wipes.        OT Problem List: Decreased activity tolerance      OT Treatment/Interventions: Self-care/ADL training;Therapeutic activities    OT Goals(Current goals can be found in the care plan section) Acute Rehab OT Goals Patient Stated Goal: get well OT Goal Formulation: With patient Time For Goal Achievement: 08/13/23 Potential to Achieve Goals: Good  OT Frequency:         AM-PAC OT 6 Clicks Daily Activity     Outcome Measure Help from another person eating meals?: None Help from another person taking care of personal grooming?: None Help from another person toileting, which includes using toliet, bedpan, or urinal?: A Little Help from another person bathing (including washing, rinsing, drying)?: A Little Help from another person to put on and taking off regular upper body clothing?: A Little Help from another person to put on and taking off regular lower body clothing?: A Little 6 Click Score: 20   End of Session Nurse Communication: Mobility status  Activity Tolerance: Patient tolerated treatment well Patient left: in  bed;with call bell/phone within reach  OT Visit Diagnosis: Muscle weakness (generalized) (M62.81)                Time: 8677-8654 OT Time Calculation (min): 23 min Charges:  OT General Charges $OT Visit: 1 Visit OT Evaluation $OT Eval Low Complexity: 1 Low    Lincon Sahlin, Norvel BIRCH 07/30/2023, 2:44 PM

## 2023-07-30 NOTE — Progress Notes (Signed)
 Mobility Specialist - Progress Note  (RA) During mobility: 81 bpm HR, 94% SpO2 Post-mobility: 68 bpm HR, 96% SPO2   07/30/23 0912  Mobility  Activity Ambulated with assistance in hallway  Level of Assistance Standby assist, set-up cues, supervision of patient - no hands on  Assistive Device Front wheel walker  Distance Ambulated (ft) 100 ft  Range of Motion/Exercises Active  Activity Response Tolerated well  Mobility Referral Yes  Mobility visit 1 Mobility  Mobility Specialist Start Time (ACUTE ONLY) [id number]  Mobility Specialist Stop Time (ACUTE ONLY) 0912  Mobility Specialist Time Calculation (min) (ACUTE ONLY) 14 min   Pt was found in bed and agreeable to ambulate. Stated feeling a little SOB during session. At EOS returned to recliner chair with all needs met. Call bell in reach.  Erminio Leos Mobility Specialist

## 2023-07-31 DIAGNOSIS — J9601 Acute respiratory failure with hypoxia: Secondary | ICD-10-CM | POA: Diagnosis not present

## 2023-07-31 LAB — CBC
HCT: 37.5 % (ref 36.0–46.0)
Hemoglobin: 12 g/dL (ref 12.0–15.0)
MCH: 28.6 pg (ref 26.0–34.0)
MCHC: 32 g/dL (ref 30.0–36.0)
MCV: 89.5 fL (ref 80.0–100.0)
Platelets: 201 10*3/uL (ref 150–400)
RBC: 4.19 MIL/uL (ref 3.87–5.11)
RDW: 15.5 % (ref 11.5–15.5)
WBC: 5.1 10*3/uL (ref 4.0–10.5)
nRBC: 0 % (ref 0.0–0.2)

## 2023-07-31 LAB — BASIC METABOLIC PANEL
Anion gap: 7 (ref 5–15)
BUN: 40 mg/dL — ABNORMAL HIGH (ref 8–23)
CO2: 21 mmol/L — ABNORMAL LOW (ref 22–32)
Calcium: 8.8 mg/dL — ABNORMAL LOW (ref 8.9–10.3)
Chloride: 108 mmol/L (ref 98–111)
Creatinine, Ser: 2.24 mg/dL — ABNORMAL HIGH (ref 0.44–1.00)
GFR, Estimated: 24 mL/min — ABNORMAL LOW (ref >60)
Glucose, Bld: 121 mg/dL — ABNORMAL HIGH (ref 70–99)
Potassium: 4.1 mmol/L (ref 3.5–5.1)
Sodium: 136 mmol/L (ref 135–145)

## 2023-07-31 MED ORDER — PREDNISONE 20 MG PO TABS: 40.0000 mg | ORAL_TABLET | Freq: Every day | ORAL | 0 refills | Status: AC

## 2023-07-31 MED ORDER — AMOXICILLIN 250 MG PO CAPS: 250.0000 mg | ORAL_CAPSULE | Freq: Two times a day (BID) | ORAL | 0 refills | Status: AC

## 2023-07-31 MED ORDER — AMOXICILLIN 250 MG PO CAPS
250.0000 mg | ORAL_CAPSULE | Freq: Two times a day (BID) | ORAL | Status: DC
  Administered 2023-07-31: 250 mg via ORAL
  Filled 2023-07-31 (×2): qty 1

## 2023-07-31 NOTE — Progress Notes (Signed)
 Physical Therapy Treatment Patient Details Name: Angela Gardner MRN: [id number] DOB: [birth year] Today's Date: 07/31/2023   History of Present Illness Pt is a 63 y/o F admitted on 07/27/23 after presenting with c/o SOB, wheezing, coughing, fevers & chills. Pt tested positive for Influenza A, COPD exacerbation. PMH: COPD, tobacco use, endometrial CA on chemo & radiation therapy, HLD, depression, CKD 3    PT Comments  The patient reported intermittent dizziness and SOB. Patient ambulated x 120' using RW. Patient stopped several times with Dyspnea 3/4. Patient reports having a rollator, then eluded that she may have given it away. Patient currently does need a RW for safe ambulation.   SpO2 after ambulation 955, HR 90.    If plan is discharge home, recommend the following: A little help with walking and/or transfers;A little help with bathing/dressing/bathroom;Assistance with cooking/housework;Assist for transportation;Help with stairs or ramp for entrance   Can travel by private vehicle        Equipment Recommendations    RW if she does not have one   Recommendations for Other Services       Precautions / Restrictions Precautions Precautions: Fall Precaution Comments: gets SOB with ambulation, Sats are OK     Mobility  Bed Mobility Overal bed mobility: Modified Independent                  Transfers Overall transfer level: Modified independent                      Ambulation/Gait Ambulation/Gait assistance: Contact guard assist, Supervision Gait Distance (Feet): 120 Feet Assistive device: Rolling walker (2 wheels) Gait Pattern/deviations: Decreased step length - right, Decreased stride length, Decreased step length - left       General Gait Details: noted tremors in hands on Rw, Patient slows down, increased RR, reports SOB but felt she could amb back to room.   Stairs             Wheelchair Mobility     Tilt Bed    Modified Rankin (Stroke  Patients Only)       Balance   Sitting-balance support: Feet supported Sitting balance-Leahy Scale: Good     Standing balance support: During functional activity, Bilateral upper extremity supported, Reliant on assistive device for balance Standing balance-Leahy Scale: Fair                              Cognition Arousal: Alert Behavior During Therapy: WFL for tasks assessed/performed                                            Exercises      General Comments        Pertinent Vitals/Pain Pain Assessment Faces Pain Scale: Hurts little more Pain Location: back, l upper ant chest, arthritis Pain Descriptors / Indicators: Aching Pain Intervention(s): Heat applied, Monitored during session    Home Living                          Prior Function            PT Goals (current goals can now be found in the care plan section) Progress towards PT goals: Progressing toward goals    Frequency    Min 1X/week  PT Plan      Co-evaluation              AM-PAC PT "6 Clicks" Mobility   Outcome Measure  Help needed turning from your back to your side while in a flat bed without using bedrails?: None Help needed moving from lying on your back to sitting on the side of a flat bed without using bedrails?: None Help needed moving to and from a bed to a chair (including a wheelchair)?: None Help needed standing up from a chair using your arms (e.g., wheelchair or bedside chair)?: A Little Help needed to walk in hospital room?: A Little Help needed climbing 3-5 steps with a railing? : A Little 6 Click Score: 21    End of Session Equipment Utilized During Treatment: Oxygen Activity Tolerance: Patient tolerated treatment well Patient left: in bed;with call bell/phone within reach Nurse Communication: Mobility status PT Visit Diagnosis: Unsteadiness on feet (R26.81);Muscle weakness (generalized) (M62.81)     Time:  1610-9604 PT Time Calculation (min) (ACUTE ONLY): 15 min  Charges:    $Gait Training: 8-22 mins PT General Charges $$ ACUTE PT VISIT: 1 Visit                    Abelina Hoes PT Acute Rehabilitation Services Office [phone] Weekend pager-[phone]    Dareen Ebbing 07/31/2023, 12:41 PM

## 2023-07-31 NOTE — Progress Notes (Signed)
 Patient discharged via wheelchair on RA. VSS. Patient had belongings which included clothes, shoes, cell phone and charger. Patient was in no distress at the time of discharge.

## 2023-07-31 NOTE — Discharge Summary (Signed)
 Angela Gardner WJX:[id number] DOB: [birth year] DOA: 07/27/2023  PCP: Angela Mulberry, MD  Admit date: 07/27/2023 Discharge date: 07/31/2023  Time spent: 35 minutes  Recommendations for Outpatient Follow-up:  Pcp f/u 1 week, check kidney function then Close f/u [facility] dr. Bearl [facility]     Discharge Diagnoses:  Principal Problem:   Acute respiratory failure with hypoxia (HCC) Active Problems:   Endometrial cancer (HCC)   Chronic kidney disease (CKD), stage III (moderate) (HCC)   Depression   History of sleep walking   Hyperlipemia   Cocaine abuse (HCC)   Influenza   COPD with acute exacerbation (HCC)   Tobacco use   Influenza A   Discharge Condition: improved  Diet recommendation: heart healthy  Filed Weights   07/27/23 1400  Weight: 97.4 kg    History of present illness:  From admission h and p This is a 63 year old female past medical history significant for COPD, tobacco use, endometrial cancer [on chemotherapy and radiation therapy], HLD, depression, CKD stage III. Per patient she saw her oncologist on Monday and has been in bed since. Since Monday she developed shortness of breath, wheezing, coughing, fevers and chills. She also endorses nausea, vomiting, diarrhea, body aches, weakness and poor appetite. She decided to come to the ER.   Hospital Course:  Patient presents with fever, bodyaches, dyspnea. Found to be hypoxic and flu a positive. Treated with tamiflu  and corticosteroids given underlying copd with exacerbation. Also required oxygen, since weaned off. Respiratory status much improved, ambulates satisfactorily. PT advising HH PT which has been ordered. Also with dysuria and spotting, recently treated for uti, here culture growing 10k e faecalis, given symptoms and recent abx will take this to be a positive and will discharge with a course of amoxicillin , will also f/u sensitivities. For the spotting, given patient's endometrial cancer history, I  strongly advised she f/u with dr. Gaylin Gardner at the [facility], who follows the patient for her history of endometrial cancer. Has baseline cr of ~2 and here is 2.14 so will need attention to kidney function at pcp f/u and may need to adjust diuretic and ARB if kidney function does not fully return to normal. Patient also endorsed housing and financial instability and received resource counseling by our Angela Gardner. Other chronic medical problems stable.    Procedures: none   Consultations: none  Discharge Exam: Vitals:   07/31/23 0858 07/31/23 1208  BP:  110/71  Pulse:  (!) 59  Resp:  16  Temp:  (!) 97.5 F (36.4 C)  SpO2: 98% 96%    General: NAD Cardiovascular: RRR Respiratory: CTAB  Discharge Instructions   Discharge Instructions     Ambulatory referral to Physical Therapy   Complete by: As directed    Diet - low sodium heart healthy   Complete by: As directed    Increase activity slowly   Complete by: As directed       Allergies as of 07/31/2023       Reactions   Vicodin [hydrocodone-acetaminophen ] Itching        Medication List     STOP taking these medications    fluconazole  150 MG tablet Commonly known as: Diflucan        TAKE these medications    amLODipine  10 MG tablet Commonly known as: NORVASC  Take 1 tablet (10 mg total) by mouth daily. Please schedule an appointment with Dr. Newlin   amoxicillin  250 MG capsule Commonly known as: AMOXIL  Take 1 capsule (250 mg total)  by mouth every 12 (twelve) hours for 9 doses.   atorvastatin  80 MG tablet Commonly known as: LIPITOR Take 1 tablet (80 mg total) by mouth daily. Please make an appointment with Dr. Newlin.   buPROPion  150 MG 24 hr tablet Commonly known as: Wellbutrin  XL Take 1 tablet (150 mg total) by mouth daily for smoking cessation   FLUoxetine  20 MG capsule Commonly known as: PROzac  Take 1 capsule (20 mg total) by mouth daily.Please make PCP appointment for more  refills.   furosemide  40 MG tablet Commonly known as: LASIX  Take 1 tablet (40 mg total) by mouth 2 (two) times daily. Please schedule an appointment with Dr. Newlin   hydrALAZINE  100 MG tablet Commonly known as: APRESOLINE  Take 1 tablet (100 mg total) by mouth 3 (three) times daily. Please schedule an appointment with Dr. Newlin   losartan  50 MG tablet Commonly known as: COZAAR  Take 1 tablet (50 mg total) by mouth daily.   Misc. Devices Misc Bedside commode.  Diagnosis urinary incontinence   pantoprazole  40 MG tablet Commonly known as: PROTONIX  Take 1 tablet (40 mg total) by mouth daily. Please schedule an appointment with Dr. Newlin   predniSONE  20 MG tablet Commonly known as: DELTASONE  Take 2 tablets (40 mg total) by mouth daily with breakfast for 4 days. Start taking on: August 01, 2023       Allergies  Allergen Reactions   Vicodin [Hydrocodone-Acetaminophen ] Itching    Follow-up Information     [facility]. Call.   Contact information: [address] [place] [address] [phone]        [facility] [facility]. Call.   Contact information: [address] [place], [place] [address] [phone]        [employer], broadcasting/film/video. Call.   Contact information: [address] Elda Greener [place], [place] [address] [phone]        [facility] [employer]. Call.   Contact information: [address] [place], [place] [address] [phone]        [employer]. Call.   Contact information: [address] [place]undria Leech [place], [place] [address] [phone]        [facility] [employer]. Call.   Why: Utility assistance/utility hotline: 706-342-1903 ext. 314 Contact information: [address] [employer] [phone]        [facility]. Call.   Contact information: Energy Assistance Program (heating/cooling and water  assistance) [phone]         Angela Mulberry, MD Follow up.   Specialty: Family Medicine Why: call to schedule follow-up in one week Contact information: [address] [place] [address] [place] [address] [phone]         Dr. Gaylin Gardner Follow up.   Why: at the  cancer center, please call to schedule close follow-up                 The results of significant diagnostics from this hospitalization (including imaging, microbiology, ancillary and laboratory) are listed below for reference.    Significant Diagnostic Studies: DG Chest 2 View Result Date: 07/27/2023 CLINICAL DATA:  Cough.  Constant chest pain. EXAM: CHEST - 2 VIEW COMPARISON:  04/23/2022. FINDINGS: Bilateral lung fields are clear. Bilateral costophrenic angles are clear. Stable cardio-mediastinal silhouette. No acute osseous abnormalities. The soft tissues are within normal limits. IMPRESSION: No active cardiopulmonary disease. Electronically Signed   By: Beula Brunswick M.D.   On:  07/27/2023 15:42    Microbiology: Recent Results (from the past 240 hours)  Resp panel by RT-PCR (RSV, Flu A&B, Covid) Anterior Nasal Swab     Status: Abnormal   Collection Time: 07/27/23  2:22 PM   Specimen: Anterior Nasal Swab  Result Value Ref Range Status   SARS Coronavirus 2 by RT PCR NEGATIVE NEGATIVE Final    Comment: (NOTE) SARS-CoV-2 target nucleic acids are NOT DETECTED.  The SARS-CoV-2 RNA is generally detectable in upper respiratory specimens during the acute phase of infection. The lowest concentration of SARS-CoV-2 viral copies this assay can detect is 138 copies/mL. A negative result does not preclude SARS-Cov-2 infection and should not be used as the sole basis for treatment or other patient management decisions. A negative result may occur with  improper specimen collection/handling, submission of specimen other than nasopharyngeal swab, presence of viral mutation(s) within the areas targeted by this assay, and  inadequate number of viral copies(<138 copies/mL). A negative result must be combined with clinical observations, patient history, and epidemiological information. The expected result is Negative.  Fact Sheet for Patients:  BloggerCourse.com  Fact Sheet for Healthcare Providers:  SeriousBroker.it  This test is no t yet approved or cleared by the [place]  FDA and  has been authorized for detection and/or diagnosis of SARS-CoV-2 by FDA under an Emergency Use Authorization (EUA). This EUA will remain  in effect (meaning this test can be used) for the duration of the COVID-19 declaration under Section 564(b)(1) of the Act, 21 U.S.C.section 360bbb-3(b)(1), unless the authorization is terminated  or revoked sooner.       Influenza A by PCR POSITIVE (A) NEGATIVE Final   Influenza B by PCR NEGATIVE NEGATIVE Final    Comment: (NOTE) The Xpert Xpress SARS-CoV-2/FLU/RSV plus assay is intended as an aid in the diagnosis of influenza from Nasopharyngeal swab specimens and should not be used as a sole basis for treatment. Nasal washings and aspirates are unacceptable for Xpert Xpress SARS-CoV-2/FLU/RSV testing.  Fact Sheet for Patients: BloggerCourse.com  Fact Sheet for Healthcare Providers: SeriousBroker.it  This test is not yet approved or cleared by the [place]  FDA and has been authorized for detection and/or diagnosis of SARS-CoV-2 by FDA under an Emergency Use Authorization (EUA). This EUA will remain in effect (meaning this test can be used) for the duration of the COVID-19 declaration under Section 564(b)(1) of the Act, 21 U.S.C. section 360bbb-3(b)(1), unless the authorization is terminated or revoked.     Resp Syncytial Virus by PCR NEGATIVE NEGATIVE Final    Comment: (NOTE) Fact Sheet for Patients: BloggerCourse.com  Fact Sheet for  Healthcare Providers: SeriousBroker.it  This test is not yet approved or cleared by the [place]  FDA and has been authorized for detection and/or diagnosis of SARS-CoV-2 by FDA under an Emergency Use Authorization (EUA). This EUA will remain in effect (meaning this test can be used) for the duration of the COVID-19 declaration under Section 564(b)(1) of the Act, 21 U.S.C. section 360bbb-3(b)(1), unless the authorization is terminated or revoked.  Performed at [facility], [address], [place], [place] [address]   Urine Culture (for pregnant, neutropenic or urologic patients or patients with an indwelling urinary catheter)     Status: Abnormal (Preliminary result)   Collection Time: 07/29/23 10:33 PM   Specimen: Urine, Clean Catch  Result Value Ref Range Status   Specimen Description   Final    URINE, CLEAN CATCH Performed at [facility], [address]  [place], [place] [address]    Special Requests   Final    URINE, CLEAN CATCH Performed at [facility], [address], [place], [place] [address]    Culture (A)  Final    10,000 COLONIES/mL ENTEROCOCCUS FAECALIS SUSCEPTIBILITIES TO FOLLOW Performed at [facility], [address] [place], [place] [address]    Report Status PENDING  Incomplete     Labs: Basic Metabolic Panel: Recent Labs  Lab 07/27/23 1420 07/28/23 0104 07/28/23 0500 07/29/23 0516 07/30/23 0511 07/31/23 0422  NA 139  --  137 136 138 136  K 3.7  --  4.0 3.5 3.3* 4.1  CL 105  --  105 105 107 108  CO2 22  --  20* 20* 20* 21*  GLUCOSE 104*  --  144* 125* 114* 121*  BUN 25*  --  32* 37* 37* 40*  CREATININE 1.93* 2.20* 2.19* 2.31* 2.16* 2.24*  CALCIUM  8.8*  --  8.8* 8.9 8.7* 8.8*   Liver Function Tests: Recent Labs  Lab 07/27/23 1420  AST 23  ALT 12  ALKPHOS 81  BILITOT 0.7  PROT 7.2  ALBUMIN 3.5   No results for input(s): "LIPASE", "AMYLASE" in  the last 168 hours. No results for input(s): "AMMONIA" in the last 168 hours. CBC: Recent Labs  Lab 07/27/23 1420 07/28/23 0104 07/30/23 0511 07/31/23 0422  WBC 3.8* 6.6 4.6 5.1  NEUTROABS 2.4 5.8  --   --   HGB 14.1 12.9 12.7 12.0  HCT 46.4* 41.3 40.3 37.5  MCV 93.4 92.8 90.6 89.5  PLT 178 173 207 201   Cardiac Enzymes: No results for input(s): "CKTOTAL", "CKMB", "CKMBINDEX", "TROPONINI" in the last 168 hours. BNP: BNP (last 3 results) No results for input(s): "BNP" in the last 8760 hours.  ProBNP (last 3 results) No results for input(s): "PROBNP" in the last 8760 hours.  CBG: No results for input(s): "GLUCAP" in the last 168 hours.     Signed:  Raymonde Calico MD.  [facility] 07/31/2023, 2:41 PM

## 2023-07-31 NOTE — Plan of Care (Signed)

## 2023-07-31 NOTE — TOC Progression Note (Deleted)
 Transition of Care [facility]) - Progression Note   Patient Details  Name: Angela Gardner MRN: [id number] Date of Birth: [birth year]  Transition of Care [facility]) CM/SW Contact  Zenon Hilda, LCSW Phone Number: 07/31/2023, 10:02 AM  Clinical Narrative: Transportation, utility assistance, and food pantry resources added to AVS.  Expected Discharge Plan: OP Rehab Barriers to Discharge: Continued Medical Work up  Expected Discharge Plan and Services Living arrangements for the past 2 months: Hotel/Motel  Social Determinants of Health (SDOH) Interventions SDOH Screenings   Food Insecurity: Food Insecurity Present (07/28/2023)  Housing: High Risk (07/28/2023)  Transportation Needs: Unmet Transportation Needs (07/28/2023)  Utilities: At Risk (07/28/2023)  Alcohol  Screen: Low Risk  (02/14/2023)  Depression (PHQ2-9): Medium Risk (02/14/2023)  Financial Resource Strain: Low Risk  (02/14/2023)  Physical Activity: Inactive (02/14/2023)  Social Connections: Socially Isolated (02/14/2023)  Stress: No Stress Concern Present (02/14/2023)  Tobacco Use: Medium Risk (07/27/2023)  Health Literacy: Adequate Health Literacy (02/14/2023)   Readmission Risk Interventions     No data to display

## 2023-07-31 NOTE — TOC Progression Note (Signed)
 Transition of Care [facility] : [facility]) - Progression Note   Patient Details  Name: Angela Gardner MRN: [id number] Date of Birth: 10-23-1960  Transition of Care [facility]) CM/SW Contact  Zenon Hilda, LCSW Phone Number: 07/31/2023, 10:04 AM  Clinical Narrative: Transportation, utility assistance, and food pantry resources added to AVS.    Expected Discharge Plan: OP Rehab Barriers to Discharge: Continued Medical Work up  Expected Discharge Plan and Services Living arrangements for the past 2 months: Hotel/Motel  Social Determinants of Health (SDOH) Interventions SDOH Screenings   Food Insecurity: Food Insecurity Present (07/28/2023)  Housing: High Risk (07/28/2023)  Transportation Needs: Unmet Transportation Needs (07/28/2023)  Utilities: At Risk (07/28/2023)  Alcohol  Screen: Low Risk  (02/14/2023)  Depression (PHQ2-9): Medium Risk (02/14/2023)  Financial Resource Strain: Low Risk  (02/14/2023)  Physical Activity: Inactive (02/14/2023)  Social Connections: Socially Isolated (02/14/2023)  Stress: No Stress Concern Present (02/14/2023)  Tobacco Use: Medium Risk (07/27/2023)  Health Literacy: Adequate Health Literacy (02/14/2023)   Readmission Risk Interventions    07/31/2023   10:04 AM  Readmission Risk Prevention Plan  Transportation Screening Complete  Medication Review (RN Care Manager) Complete  HRI or Home Care Consult Complete  SW Recovery Care/Counseling Consult Complete  Palliative Care Screening Not Applicable  Skilled Nursing Facility Not Applicable

## 2023-08-01 ENCOUNTER — Telehealth: Payer: Self-pay | Admitting: *Deleted

## 2023-08-01 LAB — URINE CULTURE: Culture: 10000 — AB

## 2023-08-01 NOTE — Transitions of Care (Post Inpatient/ED Visit) (Signed)
   08/01/2023  Name: Angela Gardner MRN: [id number] DOB: [birth year]  Today's TOC FU Call Status: Today's TOC FU Call Status:: Unsuccessful Call (1st Attempt) Unsuccessful Call (1st Attempt) Date: 08/01/23  Attempted to reach the patient regarding the most recent Inpatient/ED visit.  Follow Up Plan: Additional outreach attempts will be made to reach the patient to complete the Transitions of Care (Post Inpatient/ED visit) call.  Gean Maidens BSN RN  [facility] Health Care Management Coordinator [email or web address] Direct Dial: [phone]  Fax: [phone] Website: [email or web address]

## 2023-08-04 ENCOUNTER — Telehealth: Payer: Self-pay

## 2023-08-04 NOTE — Transitions of Care (Post Inpatient/ED Visit) (Signed)
   08/04/2023  Name: Amariona Rathje MRN: [id number] DOB: 1960-10-24  Today's TOC FU Call Status: Today's TOC FU Call Status:: Unsuccessful Call (2nd Attempt) Unsuccessful Call (2nd Attempt) Date: 08/04/23  Attempted to reach the patient regarding the most recent Inpatient/ED visit.  Follow Up Plan: Additional outreach attempts will be made to reach the patient to complete the Transitions of Care (Post Inpatient/ED visit) call.   Alyse Low, RN, BA, [facility], CRRN [facility] Coordinator, Transition of Care Ph # [phone]

## 2023-08-07 ENCOUNTER — Ambulatory Visit: Payer: 59 | Admitting: Physical Therapy

## 2023-08-07 ENCOUNTER — Telehealth: Payer: Self-pay

## 2023-08-07 DIAGNOSIS — J9601 Acute respiratory failure with hypoxia: Secondary | ICD-10-CM

## 2023-08-07 DIAGNOSIS — C801 Malignant (primary) neoplasm, unspecified: Secondary | ICD-10-CM

## 2023-08-07 DIAGNOSIS — J111 Influenza due to unidentified influenza virus with other respiratory manifestations: Secondary | ICD-10-CM

## 2023-08-07 DIAGNOSIS — I509 Heart failure, unspecified: Secondary | ICD-10-CM

## 2023-08-07 NOTE — Transitions of Care (Post Inpatient/ED Visit) (Signed)
   08/07/2023  Name: Angela Gardner MRN: [id number] DOB: December 10, 1960  Today's TOC FU Call Status: Today's TOC FU Call Status:: Unsuccessful Call (3rd Attempt) Unsuccessful Call (3rd Attempt) Date: 08/07/23  Attempted to reach the patient regarding the most recent Inpatient/ED visit.  Follow Up Plan: No further outreach attempts will be made at this time. We have been unable to contact the patient.  Alyse Low, RN, BA, [facility], CRRN [facility] Coordinator, Transition of Care Ph # [phone]

## 2023-08-07 NOTE — Therapy (Signed)
OUTPATIENT PHYSICAL THERAPY NEURO EVALUATION - ARRIVED NO CHARGE   Patient Name: Angela Gardner MRN: [id number] DOB:01-25-1961, 63 y.o., female Today's Date: 08/07/2023   PCP: [name], MD REFERRING PROVIDER: [name], MD  END OF SESSION:   Past Medical History:  Diagnosis Date   Acute bronchitis 12/04/2008   Qualifier: Diagnosis of  By: Philipp Deputy MD, Tresa Endo     Acute CHF (congestive heart failure) (HCC) 09/12/2017   Acute exacerbation of CHF (congestive heart failure) (HCC) 09/13/2017   Anemia    Antineoplastic chemotherapy induced anemia 04/05/2018   Cancer (HCC)    left ductal papilloma   Chest discomfort 09/12/2017   Negative stress test in May 2019   Chest pain 12/20/2019   CHF (congestive heart failure) (HCC)    Chronic kidney disease (CKD), stage III (moderate) 10/30/2017   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   PMB (postmenopausal bleeding) 06/17/2015   Prediabetes 04/02/2018    Preventative health care 11/07/2018   Renal disorder    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   Vitamin D deficiency 02/15/2018   Past Surgical History:  Procedure Laterality Date   BREAST EXCISIONAL BIOPSY Left 2013   CESAREAN SECTION     one   CHOLECYSTECTOMY     DILATION AND CURETTAGE OF UTERUS     approx 1 year ago   IR IMAGING GUIDED PORT INSERTION  12/06/2017   IR REMOVAL TUN ACCESS W/ PORT W/O FL MOD SED  06/19/2018   LYMPH NODE BIOPSY N/A 11/14/2017   Procedure: SENTINEL LYMPH NODE BIOPSY;  Surgeon: Adolphus Birchwood, MD;  Location: [facility];  Service: Gynecology;  Laterality: N/A;   ROBOTIC ASSISTED TOTAL HYSTERECTOMY WITH BILATERAL SALPINGO OOPHERECTOMY N/A 11/14/2017   Procedure: XI ROBOTIC ASSISTED TOTAL HYSTERECTOMY WITH BILATERAL SALPINGO OOPHORECTOMY, RIGHT PELVIC LYMPHADENECTOMY;  Surgeon: Adolphus Birchwood, MD;  Location: [facility];  Service: Gynecology;  Laterality: N/A;   Patient Active Problem List   Diagnosis Date Noted   Influenza 07/28/2023   Acute respiratory failure with hypoxia (HCC) 07/28/2023   COPD with acute exacerbation (HCC) 07/28/2023   Tobacco use 07/28/2023   Influenza A 07/28/2023   Urinary incontinence 11/19/2021   Acute hypoxemic respiratory failure due to COVID-19 [facility]) 01/07/2021   Cocaine abuse (HCC) 01/07/2021   Noncompliance 09/25/2020   CHF (congestive heart failure) (HCC)    Anemia    Cancer (HCC)    Dyspnea    History of sleep walking  Hx of left breast biopsy    Hyperlipemia    Hypertension    Renal disorder    Chest pain 12/20/2019   Left knee pain    Preventative health care 11/07/2018   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   Antineoplastic chemotherapy induced anemia 04/05/2018   Depression 04/02/2018   Prediabetes 04/02/2018   Vitamin D deficiency 02/15/2018   Diffuse pain 01/04/2018   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   Deficiency anemia 12/06/2017   Other  constipation 11/28/2017   Illicit drug use 11/28/2017   Chronic kidney disease (CKD), stage III (moderate) (HCC) 10/30/2017   Essential hypertension 10/27/2017   Diastolic congestive heart failure, NYHA class 3 (HCC) 10/27/2017   Endometrial cancer (HCC) 10/19/2017   Morbid obesity (HCC) 10/09/2017   Acute exacerbation of CHF (congestive heart failure) (HCC) 09/13/2017   Hypertensive crisis 09/12/2017   Mild renal insufficiency 09/12/2017   Dysfunctional uterine bleeding 09/12/2017   Acute CHF (congestive heart failure) (HCC) 09/12/2017   Chest discomfort 09/12/2017   PMB (postmenopausal bleeding) 06/17/2015   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   OTITIS MEDIA 12/04/2008   ACUTE BRONCHITIS 12/04/2008    ONSET DATE: 07/28/2023  REFERRING DIAG: J11.1 (ICD-10-CM) - Influenza U07.1,J96.01 (ICD-10-CM) - Acute hypoxemic respiratory failure due to COVID-19 (HCC) I50.9 (ICD-10-CM) - Congestive heart failure, unspecified HF chronicity, unspecified heart failure type (HCC) C80.1 (ICD-10-CM) - Cancer (HCC) R53.1 (ICD-10-CM) - Generalized weakness G62.0,T45.1X5A (ICD-10-CM) - Peripheral neuropathy due to chemotherapy (HCC)  THERAPY DIAG:  No diagnosis found.  Rationale for Evaluation and Treatment: Rehabilitation  SUBJECTIVE:                                                                                                                                                                                             SUBJECTIVE STATEMENT: Pt reports she is fine and is back at her baseline after being hospitalized from the flu and COPD exacerbation. No difficulties walking or with her balance. Has been taking her medicine. Not using an AD.Pt reports that she does not need PT at this time.   Pt accompanied by: self  PERTINENT HISTORY: Pt admitted on 07/27/23 after presenting with c/o SOB, wheezing, coughing, fevers & chills. Pt tested positive for Influenza A, COPD exacerbation. PMH: COPD,  tobacco use, endometrial CA on chemo & radiation therapy, HLD, depression, CKD 3    Leveta Wahab N Fleurette Woolbright, PT, DPT 08/07/2023, 7:45 AM

## 2023-08-11 ENCOUNTER — Telehealth (INDEPENDENT_AMBULATORY_CARE_PROVIDER_SITE_OTHER): Payer: Self-pay | Admitting: Family Medicine

## 2023-08-11 NOTE — Telephone Encounter (Signed)
Copied from CRM [id number]. Topic: General - Other >> Aug 11, 2023  8:04 AM Everette C wrote: Reason for CRM: The patient has called to request direct contact with their social worker J. Lews  Please contact the patient further when possible to discuss concerns related to housing

## 2023-09-18 ENCOUNTER — Other Ambulatory Visit: Payer: Self-pay

## 2023-09-18 ENCOUNTER — Encounter: Payer: Self-pay | Admitting: Family Medicine

## 2023-09-18 ENCOUNTER — Ambulatory Visit: Payer: 59 | Attending: Family Medicine | Admitting: Family Medicine

## 2023-09-18 VITALS — BP 134/87 | HR 66 | Temp 98.2°F | Resp 16 | Wt 210.0 lb

## 2023-09-18 DIAGNOSIS — G629 Polyneuropathy, unspecified: Secondary | ICD-10-CM

## 2023-09-18 DIAGNOSIS — R7303 Prediabetes: Secondary | ICD-10-CM | POA: Diagnosis not present

## 2023-09-18 DIAGNOSIS — T451X5A Adverse effect of antineoplastic and immunosuppressive drugs, initial encounter: Secondary | ICD-10-CM | POA: Diagnosis not present

## 2023-09-18 DIAGNOSIS — N1831 Chronic kidney disease, stage 3a: Secondary | ICD-10-CM

## 2023-09-18 DIAGNOSIS — I129 Hypertensive chronic kidney disease with stage 1 through stage 4 chronic kidney disease, or unspecified chronic kidney disease: Secondary | ICD-10-CM

## 2023-09-18 DIAGNOSIS — I5032 Chronic diastolic (congestive) heart failure: Secondary | ICD-10-CM

## 2023-09-18 DIAGNOSIS — N183 Chronic kidney disease, stage 3 unspecified: Secondary | ICD-10-CM

## 2023-09-18 DIAGNOSIS — Z139 Encounter for screening, unspecified: Secondary | ICD-10-CM

## 2023-09-18 DIAGNOSIS — Z8542 Personal history of malignant neoplasm of other parts of uterus: Secondary | ICD-10-CM

## 2023-09-18 DIAGNOSIS — G620 Drug-induced polyneuropathy: Secondary | ICD-10-CM

## 2023-09-18 DIAGNOSIS — F419 Anxiety disorder, unspecified: Secondary | ICD-10-CM

## 2023-09-18 LAB — POCT GLYCOSYLATED HEMOGLOBIN (HGB A1C): HbA1c, POC (prediabetic range): 6 % (ref 5.7–6.4)

## 2023-09-18 MED ORDER — FUROSEMIDE 40 MG PO TABS
40.0000 mg | ORAL_TABLET | Freq: Two times a day (BID) | ORAL | 1 refills | Status: DC
  Filled 2023-09-18 – 2023-09-20 (×4): qty 180, 90d supply, fill #0
  Filled 2023-12-19 – 2024-01-09 (×2): qty 180, 90d supply, fill #1

## 2023-09-18 MED ORDER — ATORVASTATIN CALCIUM 80 MG PO TABS
80.0000 mg | ORAL_TABLET | Freq: Every day | ORAL | 1 refills | Status: DC
  Filled 2023-09-18 – 2023-09-20 (×4): qty 90, 90d supply, fill #0
  Filled 2023-12-19 – 2024-01-09 (×2): qty 90, 90d supply, fill #1

## 2023-09-18 MED ORDER — LOSARTAN POTASSIUM 50 MG PO TABS
50.0000 mg | ORAL_TABLET | Freq: Every day | ORAL | 1 refills | Status: DC
  Filled 2023-09-18 – 2023-10-02 (×3): qty 90, 90d supply, fill #0
  Filled 2023-12-19 – 2024-01-09 (×2): qty 90, 90d supply, fill #1

## 2023-09-18 MED ORDER — FLUOXETINE HCL 20 MG PO CAPS
20.0000 mg | ORAL_CAPSULE | Freq: Every day | ORAL | 1 refills | Status: DC
  Filled 2023-09-18 – 2023-09-20 (×4): qty 90, 90d supply, fill #0
  Filled 2023-12-19 – 2024-01-09 (×2): qty 90, 90d supply, fill #1

## 2023-09-18 MED ORDER — GABAPENTIN 300 MG PO CAPS
300.0000 mg | ORAL_CAPSULE | Freq: Every day | ORAL | 1 refills | Status: DC
Start: 2023-09-18 — End: 2024-04-02
  Filled 2023-09-18 – 2023-09-20 (×4): qty 90, 90d supply, fill #0
  Filled 2023-12-19 – 2024-01-09 (×2): qty 90, 90d supply, fill #1

## 2023-09-18 MED ORDER — PANTOPRAZOLE SODIUM 40 MG PO TBEC
40.0000 mg | DELAYED_RELEASE_TABLET | Freq: Every day | ORAL | 1 refills | Status: DC
  Filled 2023-09-18 – 2023-09-20 (×4): qty 90, 90d supply, fill #0
  Filled 2023-12-19 – 2024-01-09 (×2): qty 90, 90d supply, fill #1

## 2023-09-18 MED ORDER — HYDRALAZINE HCL 100 MG PO TABS
100.0000 mg | ORAL_TABLET | Freq: Three times a day (TID) | ORAL | 1 refills | Status: DC
  Filled 2023-09-18 – 2023-09-20 (×4): qty 90, 30d supply, fill #0
  Filled 2023-10-13 – 2024-01-09 (×3): qty 90, 30d supply, fill #1

## 2023-09-18 MED ORDER — AMLODIPINE BESYLATE 10 MG PO TABS
10.0000 mg | ORAL_TABLET | Freq: Every day | ORAL | 1 refills | Status: DC
  Filled 2023-09-18 – 2023-09-20 (×4): qty 90, 90d supply, fill #0
  Filled 2023-12-19 – 2024-01-09 (×2): qty 90, 90d supply, fill #1

## 2023-09-18 NOTE — Patient Instructions (Signed)
 VISIT SUMMARY:  During your routine checkup, we reviewed your history of endometrial cancer, prediabetes, hypertension, hyperlipidemia, and depression/anxiety. You recently recovered from the flu and have quit smoking. We discussed your current medications and symptoms, including numbness in your hands and abnormal kidney function. We also talked about your living situation and physical activity.  YOUR PLAN:  -ENDOMETRIAL CARCINOMA: You completed treatment for endometrial cancer in June 2019 and continue to have regular follow-ups with your oncologist, Dr. Margaret Pyle. No further radiation therapy is needed at this time.  -STAGE 3A CHRONIC KIDNEY DISEASE: Your previous blood work showed abnormal kidney function, and you are under the care of a nephrologist at [facility]. We will order blood work to re-evaluate your kidney function and you should continue your follow-ups with the nephrologist.  -HYPERTENSION: Your blood pressure was elevated during the visit but normalized upon repeat measurement. You are taking your blood pressure medication as prescribed. Continue with your current medication and monitor your blood pressure regularly.  -PERIPHERAL NEUROPATHY: Peripheral neuropathy is a condition that causes numbness, often due to nerve damage from chemotherapy. We discussed starting gabapentin to help with the numbness in your hands, which may cause drowsiness. Since you stay at home and do not drive, we agreed to start this medication.  -PREDIABETES: Prediabetes means your blood sugar levels are higher than normal but not high enough to be classified as diabetes. Your A1c level has been stable at 6.0 for the past five years. We will continue to monitor your A1c levels regularly.  -GENERAL HEALTH MAINTENANCE: We discussed the importance of regular physical activity to aid in weight loss and overall health. Despite living in a motel, you are encouraged to stay active, such as by walking on flat  ground. You are currently using the stairs regularly and have a cane for support.  INSTRUCTIONS:  Please return for fasting blood work to check your cholesterol levels. We have scheduled this for a convenient time when your sister can assist you.

## 2023-09-18 NOTE — Progress Notes (Signed)
 "  Subjective:  Patient ID: Angela Gardner, female    DOB: Oct 15, 1960  Age: 63 y.o. MRN: [id number]  CC: Employment Physical and Annual Exam     Discussed the use of AI scribe software for clinical note transcription with the patient, who gave verbal consent to proceed.  History of Present Illness The patient, with a history of  hypertension, stage III chronic kidney disease, diastolic CHF (EF 60 to 65% from 07/2018), Endometrial carcinoma diagnosed in 09/2017 s/p total abdominal hysterectomy and bilateral salpingo-oophorectomy on 11/14/2017, status post radiation and chemotherapy  presents for a routine checkup. She completed treatment for endometrial cancer in June 2019 and continues regular follow-ups at the cancer center and with her radiation oncologist.  Last oncology note reviewed from 12/2022 which indicates high risk for recurrence given unfavorable cell type.   Last month, she was recently hospitalized due to the flu, which caused respiratory distress, but she reports feeling fine now. She has quit smoking.  Despite taking her prescribed blood pressure medication, her blood pressure is elevated. She continues to take Prozac  for anxiety and depression and atorvastatin  for hyperlipidemia. She reports numbness in her hands, which she manages by wearing gloves at night.   She also has stage III CKD and confirms she is seeing a nephrologist. She is currently living in a motel and uses the stairs for exercise, which she finds challenging. She also reports prediabetes, with a stable A1c of 6.0 over the past five years. Nursing note she states she is here for employment physical but she informs me she does not need an employment physical as she is currently not employed.   Past Medical History:  Diagnosis Date   Acute bronchitis 12/04/2008   Qualifier: Diagnosis of  By: Janna MD, Burnard     Acute CHF (congestive heart failure) (HCC) 09/12/2017   Acute exacerbation of CHF (congestive heart  failure) (HCC) 09/13/2017   Anemia    Antineoplastic chemotherapy induced anemia 04/05/2018   Cancer (HCC)    left ductal papilloma   Chest discomfort 09/12/2017   Negative stress test in May 2019   Chest pain 12/20/2019   CHF (congestive heart failure) (HCC)    Chronic kidney disease (CKD), stage III (moderate) 10/30/2017   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   PMB (postmenopausal bleeding) 06/17/2015   Prediabetes 04/02/2018   Preventative health care 11/07/2018   Renal disorder    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 05/25/2018   Vitamin D  deficiency 02/15/2018    Past Surgical History:  Procedure Laterality Date   BREAST EXCISIONAL BIOPSY Left 2013   CESAREAN SECTION     one   CHOLECYSTECTOMY     DILATION AND CURETTAGE OF UTERUS     approx 1 year ago   IR IMAGING GUIDED PORT INSERTION  12/06/2017   IR REMOVAL TUN ACCESS W/ PORT W/O FL MOD SED  06/19/2018   LYMPH NODE BIOPSY N/A 11/14/2017    Procedure: SENTINEL LYMPH NODE BIOPSY;  Surgeon: Eloy Herring, MD;  Location: [facility];  Service: Gynecology;  Laterality: N/A;   ROBOTIC ASSISTED TOTAL HYSTERECTOMY WITH BILATERAL SALPINGO OOPHERECTOMY N/A 11/14/2017   Procedure: XI ROBOTIC ASSISTED TOTAL HYSTERECTOMY WITH BILATERAL SALPINGO OOPHORECTOMY, RIGHT PELVIC LYMPHADENECTOMY;  Surgeon: Eloy Herring, MD;  Location: [facility];  Service: Gynecology;  Laterality: N/A;    Family History  Problem Relation Age of Onset   Anuerysm Mother    Congestive Heart Failure Mother    Hypertension Father    Hypertension Sister    Hypertension Brother    Colon cancer Neg Hx     Social History   Socioeconomic History   Marital status: Single    Spouse name: Not on file   Number of children: 1   Years of education: Not on file   Highest education level: 9th grade  Occupational History   Not on file  Tobacco Use   Smoking status: Former    Current packs/day: 0.30    Average packs/day: 0.3 packs/day for 26.0 years (7.8 ttl pk-yrs)    Types: Cigarettes   Smokeless tobacco: Never   Tobacco comments:    quit May 2019  Vaping Use   Vaping status: Never Used  Substance and Sexual Activity   Alcohol  use: Not Currently    Comment: social drinking   Drug use: Not Currently    Types: Cocaine, Crack cocaine   Sexual activity: Not Currently    Birth control/protection: Post-menopausal  Other Topics Concern   Not on file  Social History Narrative   Not on file   Social Drivers of Health   Financial Resource Strain: Low Risk  (02/14/2023)   Overall Financial Resource Strain (CARDIA)    Difficulty of Paying Living Expenses: Not hard at all  Food Insecurity: Food Insecurity Present (07/28/2023)   Hunger Vital Sign    Worried About Running Out of Food in the Last Year: Sometimes true    Ran Out of Food in the Last Year: Sometimes true  Transportation Needs: Unmet Transportation Needs (07/28/2023)   PRAPARE - Transportation    Lack of  Transportation (Medical): Yes    Lack of Transportation (Non-Medical): Yes  Physical Activity: Inactive (02/14/2023)   Exercise Vital Sign    Days of Exercise per Week: 0 days    Minutes of Exercise per Session: 0 min  Stress: No Stress Concern Present (02/14/2023)   [employer] of Occupational Health - Occupational Stress Questionnaire    Feeling of Stress : Not at all  Social Connections: Socially Isolated (02/14/2023)   Social Connection and Isolation Panel [NHANES]    Frequency of Communication with Friends and Family: More than three times a week    Frequency of Social Gatherings with Friends and Family: Twice a week    Attends Religious Services: Never    Database Administrator or Organizations: No    Attends Banker Meetings: Never    Marital Status: Never married  Allergies  Allergen Reactions   Vicodin Fuletia.gala ] Itching    Outpatient Medications Prior to Visit  Medication Sig Dispense Refill   Misc. Devices MISC Bedside commode.  Diagnosis urinary incontinence 1 each 0   amLODipine  (NORVASC ) 10 MG tablet Take 1 tablet (10 mg total) by mouth daily. Please schedule an appointment with Dr. Howard Bunte 30 tablet 0   atorvastatin  (LIPITOR) 80 MG tablet Take 1 tablet (80 mg total) by mouth daily. Please make an appointment with Dr. Faaris Arizpe. 30 tablet 0   FLUoxetine  (PROZAC ) 20 MG capsule Take 1 capsule (20 mg total) by mouth daily.Please make PCP appointment for more refills. 30 capsule 0   furosemide  (LASIX ) 40 MG tablet Take 1 tablet (40 mg total) by mouth 2 (two) times daily. Please schedule an appointment with Dr. Akeria Hedstrom 60 tablet 0   hydrALAZINE  (APRESOLINE ) 100 MG tablet Take 1 tablet (100 mg total) by mouth 3 (three) times daily. Please schedule an appointment with Dr. Arnice Vanepps 90 tablet 0   losartan  (COZAAR ) 50 MG tablet Take 1 tablet (50 mg total) by mouth daily. 30 tablet 5   pantoprazole  (PROTONIX ) 40 MG tablet Take 1 tablet (40 mg total) by  mouth daily. Please schedule an appointment with Dr. Nello Corro 30 tablet 0   buPROPion  (WELLBUTRIN  XL) 150 MG 24 hr tablet Take 1 tablet (150 mg total) by mouth daily for smoking cessation (Patient not taking: Reported on 09/18/2023) 30 tablet 3   No facility-administered medications prior to visit.     ROS Review of Systems  Constitutional:  Negative for activity change and appetite change.  HENT:  Negative for sinus pressure and sore throat.   Respiratory:  Negative for chest tightness, shortness of breath and wheezing.   Cardiovascular:  Negative for chest pain and palpitations.  Gastrointestinal:  Negative for abdominal distention, abdominal pain and constipation.  Genitourinary: Negative.   Musculoskeletal: Negative.   Neurological:  Positive for numbness.  Psychiatric/Behavioral:  Negative for behavioral problems and dysphoric mood.     Objective:  BP 134/87 (BP Location: Right Arm, Patient Position: Sitting, Cuff Size: Large)   Pulse 66   Temp 98.2 F (36.8 C) (Oral)   Resp 16   Wt 210 lb (95.3 kg)   LMP  (LMP Unknown) Comment: vaginal bleeding continuously for last 8 months  SpO2 98%   BMI 37.20 kg/m      09/18/2023    5:05 PM 09/18/2023    4:40 PM 07/31/2023   12:08 PM  BP/Weight  Systolic BP 134 161 110  Diastolic BP 87 99 71  Wt. (Lbs)  210   BMI  37.2 kg/m2       Physical Exam Constitutional:      Appearance: She is well-developed.  Cardiovascular:     Rate and Rhythm: Normal rate.     Heart sounds: Normal heart sounds. No murmur heard. Pulmonary:     Effort: Pulmonary effort is normal.     Breath sounds: Normal breath sounds. No wheezing or rales.  Chest:     Chest wall: No tenderness.  Abdominal:     General: Bowel sounds are normal. There is no distension.     Palpations: Abdomen is soft. There is no mass.     Tenderness: There is no abdominal tenderness.  Musculoskeletal:        General: Normal range of motion.     Right lower leg: No edema.      Left lower leg: No edema.  Neurological:  Mental Status: She is alert and oriented to person, place, and time.  Psychiatric:        Mood and Affect: Mood normal.        Latest Ref Rng & Units 07/31/2023    4:22 AM 07/30/2023    5:11 AM 07/29/2023    5:16 AM  CMP  Glucose 70 - 99 mg/dL 878  885  874   BUN 8 - 23 mg/dL 40  37  37   Creatinine 0.44 - 1.00 mg/dL 7.75  7.83  7.68   Sodium 135 - 145 mmol/L 136  138  136   Potassium 3.5 - 5.1 mmol/L 4.1  3.3  3.5   Chloride 98 - 111 mmol/L 108  107  105   CO2 22 - 32 mmol/L 21  20  20    Calcium  8.9 - 10.3 mg/dL 8.8  8.7  8.9     Lipid Panel     Component Value Date/Time   CHOL 214 (H) 08/17/2022 0836   TRIG 59 08/17/2022 0836   HDL 91 08/17/2022 0836   CHOLHDL 3.2 07/02/2019 1135   CHOLHDL 3.4 Ratio 03/30/2009 2039   VLDL 28 03/30/2009 2039   LDLCALC 113 (H) 08/17/2022 0836    CBC    Component Value Date/Time   WBC 5.1 07/31/2023 0422   RBC 4.19 07/31/2023 0422   HGB 12.0 07/31/2023 0422   HGB 9.6 (L) 05/28/2018 1135   HCT 37.5 07/31/2023 0422   PLT 201 07/31/2023 0422   PLT 152 05/28/2018 1135   MCV 89.5 07/31/2023 0422   MCH 28.6 07/31/2023 0422   MCHC 32.0 07/31/2023 0422   RDW 15.5 07/31/2023 0422   LYMPHSABS 0.5 (L) 07/28/2023 0104   MONOABS 0.2 07/28/2023 0104   EOSABS 0.0 07/28/2023 0104   BASOSABS 0.0 07/28/2023 0104    Lab Results  Component Value Date   HGBA1C 6.0 09/18/2023       Assessment & Plan History of Endometrial carcinoma Completed treatment in June 2019. Currently under regular follow-up with oncologist Dr. Mikal.  - Continue regular follow-ups with oncologist Dr. Mikal.  Stage 3a chronic kidney disease Previous blood work showed abnormal kidney function with creatinine of 2.24. Under care of nephrologist at [facility]. - Order blood work to re-evaluate kidney function. - Continue follow-up with nephrologist at [facility]. -Avoid nephrotoxins  Hypertension Blood  pressure elevated during visit but normalized upon repeat measurement. Compliant with antihypertensive medication regimen. - Continue current antihypertensive medication regimen. - Monitor blood pressure regularly.  Peripheral neuropathy Reports numbness in hands, likely secondary to chemotherapy. Discussed gabapentin , which may cause drowsiness. Agreed to start gabapentin  as she stays at home and does not drive. - Prescribe gabapentin  for neuropathy.  Prediabetes A1c at 6.0, consistent with prediabetes. No progression over the past five years. - Monitor A1c levels regularly.  General Health Maintenance Encouraged to engage in regular physical activity to aid weight loss. Discussed importance of staying active despite living in a motel. Currently using stairs regularly and has a cane for support. - Encourage regular physical activity, including walking on flat ground.  Follow-up Needs to return for fasting blood work to check cholesterol levels. Scheduled for a convenient time when her sister is available to assist. - Schedule fasting blood work for cholesterol assessment on a day convenient for her.      Meds ordered this encounter  Medications   gabapentin  (NEURONTIN ) 300 MG capsule    Sig: Take 1 capsule (300 mg total)  by mouth at bedtime.    Dispense:  90 capsule    Refill:  1   amLODipine  (NORVASC ) 10 MG tablet    Sig: Take 1 tablet (10 mg total) by mouth daily. Please schedule an appointment with Dr. Julieann Drummonds    Dispense:  90 tablet    Refill:  1   atorvastatin  (LIPITOR) 80 MG tablet    Sig: Take 1 tablet (80 mg total) by mouth daily.    Dispense:  90 tablet    Refill:  1   FLUoxetine  (PROZAC ) 20 MG capsule    Sig: Take 1 capsule (20 mg total) by mouth daily.    Dispense:  90 capsule    Refill:  1   furosemide  (LASIX ) 40 MG tablet    Sig: Take 1 tablet (40 mg total) by mouth 2 (two) times daily.    Dispense:  180 tablet    Refill:  1   hydrALAZINE  (APRESOLINE ) 100 MG  tablet    Sig: Take 1 tablet (100 mg total) by mouth 3 (three) times daily.    Dispense:  90 tablet    Refill:  1    Must have office visit for refills   losartan  (COZAAR ) 50 MG tablet    Sig: Take 1 tablet (50 mg total) by mouth daily.    Dispense:  90 tablet    Refill:  1   pantoprazole  (PROTONIX ) 40 MG tablet    Sig: Take 1 tablet (40 mg total) by mouth daily.    Dispense:  90 tablet    Refill:  1    Follow-up: Return in about 6 months (around 03/19/2024) for Chronic medical conditions.       Corrina Sabin, MD, FAAFP. [facility] [place], [place] [phone]   09/18/2023, 6:12 PM "

## 2023-09-19 ENCOUNTER — Inpatient Hospital Stay: Payer: 59 | Admitting: Family Medicine

## 2023-09-19 ENCOUNTER — Other Ambulatory Visit: Payer: Self-pay

## 2023-09-19 ENCOUNTER — Telehealth: Payer: Self-pay | Admitting: *Deleted

## 2023-09-19 ENCOUNTER — Ambulatory Visit: Attending: Family Medicine

## 2023-09-19 DIAGNOSIS — N183 Chronic kidney disease, stage 3 unspecified: Secondary | ICD-10-CM | POA: Diagnosis not present

## 2023-09-19 DIAGNOSIS — I129 Hypertensive chronic kidney disease with stage 1 through stage 4 chronic kidney disease, or unspecified chronic kidney disease: Secondary | ICD-10-CM | POA: Diagnosis not present

## 2023-09-19 NOTE — Progress Notes (Signed)
 Complex Care Management Note  Care Guide Note 09/19/2023 Name: Decie Verne MRN: [id number] DOB: July 20, 1960  Himani Corona is a 63 y.o. year old female who sees [name], MD for primary care. I reached out to [name] by phone today to offer complex care management services.  Ms. Goertzen was given information about Complex Care Management services today including:   The Complex Care Management services include support from the care team which includes your Nurse Care Manager, Clinical Social Worker, or Pharmacist.  The Complex Care Management team is here to help remove barriers to the health concerns and goals most important to you. Complex Care Management services are voluntary, and the patient may decline or stop services at any time by request to their care team member.   Complex Care Management Consent Status: Patient agreed to services and verbal consent obtained.   Follow up plan:  Telephone appointment with complex care management team member scheduled for:  4/10  Encounter Outcome:  Patient Scheduled  Gwenevere Ghazi  [facility]  [facility], [facility] Guide  Direct Dial: [phone]  Fax [phone]

## 2023-09-20 ENCOUNTER — Other Ambulatory Visit: Payer: Self-pay

## 2023-09-20 ENCOUNTER — Other Ambulatory Visit (HOSPITAL_COMMUNITY): Payer: Self-pay

## 2023-09-20 ENCOUNTER — Encounter: Payer: Self-pay | Admitting: Family Medicine

## 2023-09-20 LAB — LP+NON-HDL CHOLESTEROL
Cholesterol, Total: 201 mg/dL — ABNORMAL HIGH (ref 100–199)
HDL: 86 mg/dL (ref >39)
LDL Chol Calc (NIH): 101 mg/dL — ABNORMAL HIGH (ref 0–99)
Total Non-HDL-Chol (LDL+VLDL): 115 mg/dL (ref 0–129)
Triglycerides: 78 mg/dL (ref 0–149)
VLDL Cholesterol Cal: 14 mg/dL (ref 5–40)

## 2023-09-20 LAB — CBC WITH DIFFERENTIAL/PLATELET
Basophils Absolute: 0 10*3/uL (ref 0.0–0.2)
Basos: 1 %
EOS (ABSOLUTE): 0.1 10*3/uL (ref 0.0–0.4)
Eos: 3 %
Hematocrit: 42.2 % (ref 34.0–46.6)
Hemoglobin: 13.8 g/dL (ref 11.1–15.9)
Immature Grans (Abs): 0 10*3/uL (ref 0.0–0.1)
Immature Granulocytes: 0 %
Lymphocytes Absolute: 2.1 10*3/uL (ref 0.7–3.1)
Lymphs: 51 %
MCH: 29.1 pg (ref 26.6–33.0)
MCHC: 32.7 g/dL (ref 31.5–35.7)
MCV: 89 fL (ref 79–97)
Monocytes Absolute: 0.4 10*3/uL (ref 0.1–0.9)
Monocytes: 8 %
Neutrophils Absolute: 1.5 10*3/uL (ref 1.4–7.0)
Neutrophils: 37 %
Platelets: 233 10*3/uL (ref 150–450)
RBC: 4.75 x10E6/uL (ref 3.77–5.28)
RDW: 15.7 % — ABNORMAL HIGH (ref 11.7–15.4)
WBC: 4.2 10*3/uL (ref 3.4–10.8)

## 2023-09-20 LAB — CMP14+EGFR
ALT: 16 IU/L (ref 0–32)
AST: 18 IU/L (ref 0–40)
Albumin: 4.2 g/dL (ref 3.9–4.9)
Alkaline Phosphatase: 122 IU/L — ABNORMAL HIGH (ref 44–121)
BUN/Creatinine Ratio: 14 (ref 12–28)
BUN: 28 mg/dL — ABNORMAL HIGH (ref 8–27)
Bilirubin Total: 0.5 mg/dL (ref 0.0–1.2)
CO2: 21 mmol/L (ref 20–29)
Calcium: 9.6 mg/dL (ref 8.7–10.3)
Chloride: 105 mmol/L (ref 96–106)
Creatinine, Ser: 1.95 mg/dL — ABNORMAL HIGH (ref 0.57–1.00)
Globulin, Total: 2.6 g/dL (ref 1.5–4.5)
Glucose: 93 mg/dL (ref 70–99)
Potassium: 4.1 mmol/L (ref 3.5–5.2)
Sodium: 142 mmol/L (ref 134–144)
Total Protein: 6.8 g/dL (ref 6.0–8.5)
eGFR: 29 mL/min/{1.73_m2} — ABNORMAL LOW (ref >59)

## 2023-09-21 ENCOUNTER — Other Ambulatory Visit: Payer: Self-pay

## 2023-09-28 ENCOUNTER — Ambulatory Visit: Payer: Self-pay | Admitting: Licensed Clinical Social Worker

## 2023-09-28 NOTE — Patient Instructions (Signed)
 Visit Information  Thank you for taking time to visit with me today. Please don't hesitate to contact me if I can be of assistance to you before our next scheduled appointment.  Our next appointment is by telephone on 4/17 at 3 PM Please call the care guide team at [phone] if you need to cancel or reschedule your appointment.   Following is a copy of your care plan:   Goals Addressed             This Visit's Progress    LCSW VBCI Social Work Care Plan   On track    Problems:   Housing barriers, Disease Management support and education needs related to Anxiety with Excessive Worry,, and Financial Strain , Housing , and Stress  CSW Clinical Goal(s):   Over the next 90 days the Patient will attend all scheduled medical appointments as evidenced by patient report and care team review of appointment completion in EMR:   demonstrate a reduction in symptoms related to Anxiety with Excessive Worry, Stress at housing instability .  Interventions:  Social Determinants of Health in Patient with Anxiety and Anxiety with Excessive Worry,: SDOH assessments completed: Financial Strain  and Housing  Evaluation of current treatment plan related to unmet needs Housing resources: Mental Health:  Evaluation of current treatment plan related to Anxiety with Excessive Worry, Active listening / Reflection utilized Financial risk analyst / information provided Depression screen reviewed Emotional Support Provided Solution-Focued Strategies employed:  Patient Goals/Self-Care Activities:  Coordinate with [facility] to assist with housing instability. Increase coping skills and stress reduction  Plan:   Telephone follow up appointment with care management team member scheduled for:  1 week     COMPLETED: Obtaining Supportive Resources/Stress Management/Housin       Activities and task to complete in order to accomplish goals.   Keep  all upcoming appointments discussed today Continue with compliance of taking medication prescribed by Doctor Implement healthy coping skills discussed to assist with management of symptoms Continue working with [facility] care team to assist with goals identified Obtain invoice on work needed on your home to provide to local agencies Implement strategies discussed to assist with decreasing soda intake Obtain updated title to trailer Reach out to local agencies discussed regarding rent assistance          Please call the Suicide and Crisis Lifeline: 988 go to [facility] [address], [place] [phone]) call 911 if you are experiencing a Mental Health or Behavioral Health Crisis or need someone to talk to.  Patient verbalizes understanding of instructions and care plan provided today and agrees to view in MyChart. Active MyChart status and patient understanding of how to access instructions and care plan via MyChart confirmed with patient.     Windy Fast [facility]  [facility], [facility] Clinical Social Worker Direct Dial: [phone]  Fax: [phone] Website: [email or web address] 3:24 PM

## 2023-09-28 NOTE — Patient Outreach (Signed)
 Complex Care Management   Visit Note  09/28/2023  Name:  Angela Gardner MRN: [id number] DOB: 05-31-1961  Situation: Referral received for Complex Care Management related to Menta/Behavioral Health diagnosis Anxiety and Depression and SDOH Barriers:  Housing Instability Financial Resource Strain I obtained verbal consent from Patient.  Visit completed with Patient  on the phone  Background:   Past Medical History:  Diagnosis Date   Acute bronchitis 12/04/2008   Qualifier: Diagnosis of  By: Philipp Deputy MD, Tresa Endo     Acute CHF (congestive heart failure) (HCC) 09/12/2017   Acute exacerbation of CHF (congestive heart failure) (HCC) 09/13/2017   Anemia    Antineoplastic chemotherapy induced anemia 04/05/2018   Cancer (HCC)    left ductal papilloma   Chest discomfort 09/12/2017   Negative stress test in May 2019   Chest pain 12/20/2019   CHF (congestive heart failure) (HCC)    Chronic kidney disease (CKD), stage III (moderate) 10/30/2017   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   PMB (postmenopausal bleeding) 06/17/2015   Prediabetes 04/02/2018   Preventative health care 11/07/2018   Renal disorder    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  Medications Reviewed Today     Reviewed by Bridgett Larsson, LCSW (Social Worker) on 09/28/23 at 1444  Med List Status: <None>   Medication Order Taking? Sig Documenting Provider Last Dose Status Informant  amLODipine (NORVASC) 10 MG tablet [id number] Yes Take 1 tablet (10 mg total) by mouth daily. Please schedule an appointment with Dr. Bea Graff, Odette Horns, MD Taking Active   atorvastatin (LIPITOR) 80 MG tablet [id number] Yes Take 1 tablet (80 mg total) by mouth daily. [name], MD Taking Active   FLUoxetine (PROZAC) 20 MG capsule [id number] Yes Take 1 capsule (20 mg total) by mouth daily. [name], MD Taking Active  furosemide (LASIX) 40 MG tablet [id number] Yes Take 1 tablet (40 mg total) by mouth 2 (two) times daily. [name], MD Taking Active   gabapentin (NEURONTIN) 300 MG capsule [id number] Yes Take 1 capsule (300 mg total) by mouth at bedtime. [name], MD Taking Active   hydrALAZINE (APRESOLINE) 100 MG tablet [id number] Yes Take 1 tablet (100 mg total) by mouth 3 (three) times daily. [name], MD Taking Active   losartan (COZAAR) 50  MG tablet [id number] Yes Take 1 tablet (50 mg total) by mouth daily. [name], MD Taking Active   Misc. Devices MISC [id number] No Bedside commode.  Diagnosis urinary incontinence  Patient not taking: Reported on 09/28/2023   [name], MD Unknown Active Self, Pharmacy Records  pantoprazole (PROTONIX) 40 MG tablet [id number] Yes Take 1 tablet (40 mg total) by mouth daily. [name], MD Taking Active    Patient not taking:   Discontinued 05/29/18 0949          Med Note [facility], GAYE A   Fri Dec 08, 2017  3:50 PM) Patient hasn't started taking these medications yet             Recommendation:   Continue utilizing strategies discussed to assist with symptom management  Follow Up Plan:   Telephone follow-up in 1 week [name], LCSW [facility]  [facility], [facility] Clinical Social Worker Direct Dial: [phone]  Fax: [phone] Website: [email or web address] 3:24 PM

## 2023-10-02 ENCOUNTER — Other Ambulatory Visit (HOSPITAL_COMMUNITY): Payer: Self-pay

## 2023-10-02 ENCOUNTER — Other Ambulatory Visit: Payer: Self-pay

## 2023-10-05 ENCOUNTER — Other Ambulatory Visit: Payer: Self-pay | Admitting: Licensed Clinical Social Worker

## 2023-10-09 NOTE — Patient Instructions (Signed)
 Visit Information  Thank you for taking time to visit with me today. Please don't hesitate to contact me if I can be of assistance to you before our next scheduled appointment.  Your next care management appointment is by telephone on 5/15 at 11:30 AM  Telephone follow-up in 1 month  Please call the care guide team at [phone] if you need to cancel, schedule, or reschedule an appointment.   Please call the Suicide and Crisis Lifeline: 988 go to [facility] [address] [place] [phone]) call 911 if you are experiencing a Mental Health or Behavioral Health Crisis or need someone to talk to.  Alease Hunter, LCSW [place]  [facility], [facility] Clinical Social Worker Direct Dial: [phone]  Fax: [phone] Website: [email or web address] 8:32 AM

## 2023-10-09 NOTE — Patient Outreach (Signed)
 Complex Care Management   Visit Note  10/05/2023  Name:  Angela Gardner MRN: [id number] DOB: [birth year]  Situation: Referral received for Complex Care Management related to SDOH Barriers:  Housing Instability  I obtained verbal consent from Patient.  Visit completed with pt  on the phone  Background:   Past Medical History:  Diagnosis Date   Acute bronchitis 12/04/2008   Qualifier: Diagnosis of  By: Ferrell Hu MD, Loetta Ringer     Acute CHF (congestive heart failure) (HCC) 09/12/2017   Acute exacerbation of CHF (congestive heart failure) (HCC) 09/13/2017   Anemia    Antineoplastic chemotherapy induced anemia 04/05/2018   Cancer (HCC)    left ductal papilloma   Chest discomfort 09/12/2017   Negative stress test in May 2019   Chest pain 12/20/2019   CHF (congestive heart failure) (HCC)    Chronic kidney disease (CKD), stage III (moderate) 10/30/2017   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   PMB (postmenopausal bleeding)  06/17/2015   Prediabetes 04/02/2018   Preventative health care 11/07/2018   Renal disorder    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   Vitamin D  deficiency 02/15/2018    Assessment: Patient Reported Symptoms:  Cognitive        Neurological      HEENT        Cardiovascular      Respiratory      Endocrine      Gastrointestinal        Genitourinary      Integumentary      Musculoskeletal          Psychosocial              09/18/2023    6:19 PM  Depression screen PHQ 2/9  Decreased Interest 0  Down, Depressed, Hopeless 0  PHQ - 2 Score 0  Altered sleeping 0  Tired, decreased energy 0  Change in appetite 0  Feeling bad or failure about yourself  0  Trouble concentrating 0  Moving slowly or fidgety/restless 0  Suicidal thoughts 0  PHQ-9 Score 0    There were no vitals filed for this visit.  Medications Reviewed Today   Medications were not reviewed in this encounter     Recommendation:   Continue utilizing strategies discussed to assist with symptom management  Follow Up Plan:   Telephone follow-up in 1 month  Alease Hunter, LCSW [place]  [facility], [facility]  Clinical Scientist, clinical (histocompatibility and immunogenetics) Dial: [phone]  Fax: [phone] Website: [email or web address] 8:31 AM

## 2023-10-17 ENCOUNTER — Other Ambulatory Visit (HOSPITAL_COMMUNITY): Payer: Self-pay

## 2023-10-19 ENCOUNTER — Other Ambulatory Visit (HOSPITAL_COMMUNITY): Payer: Self-pay

## 2023-10-20 ENCOUNTER — Other Ambulatory Visit: Payer: Self-pay

## 2023-10-20 ENCOUNTER — Other Ambulatory Visit (HOSPITAL_COMMUNITY): Payer: Self-pay

## 2023-10-23 ENCOUNTER — Other Ambulatory Visit (HOSPITAL_COMMUNITY): Payer: Self-pay

## 2023-11-02 ENCOUNTER — Telehealth: Payer: Self-pay | Admitting: Licensed Clinical Social Worker

## 2023-11-09 ENCOUNTER — Encounter: Payer: Self-pay | Admitting: Licensed Clinical Social Worker

## 2023-11-09 ENCOUNTER — Telehealth: Payer: Self-pay | Admitting: Licensed Clinical Social Worker

## 2023-11-28 ENCOUNTER — Telehealth: Payer: Self-pay | Admitting: *Deleted

## 2023-11-28 NOTE — Progress Notes (Signed)
 Complex Care Management Care Guide Note  11/28/2023 Name: Angela Gardner MRN: [id number] DOB: 11-Aug-1960  Angela Gardner is a 63 y.o. year old female who is a primary care patient of Newlin, Enobong, MD and is actively engaged with the care management team. I reached out to Katheryne Pane by phone today to assist with re-scheduling  with the Licensed Clinical Child psychotherapist.  Follow up plan: Unsuccessful telephone outreach attempt made.   Barnie Bora  [facility]  [facility], [facility] Guide  Direct Dial: [phone]  Fax [phone]

## 2023-12-01 NOTE — Progress Notes (Signed)
 Complex Care Management Care Guide Note  12/01/2023 Name: Angela Gardner MRN: [id number] DOB: 1960/06/27  Angela Gardner is a 63 y.o. year old female who is a primary care patient of Newlin, Enobong, MD and is actively engaged with the care management team. I reached out to Katheryne Pane by phone today to assist with re-scheduling  with the Licensed Clinical Child psychotherapist.  Follow up plan: Unsuccessful telephone outreach attempt made. No further outreach attempts will be made at this time. We have been unable to contact the patient to reschedule for complex care management services.   Barnie Bora  [facility]  Value-Based Care Institute, [facility] Guide  Direct Dial: [phone]  Fax [phone]

## 2023-12-19 ENCOUNTER — Other Ambulatory Visit (HOSPITAL_COMMUNITY): Payer: Self-pay

## 2023-12-21 ENCOUNTER — Encounter (HOSPITAL_BASED_OUTPATIENT_CLINIC_OR_DEPARTMENT_OTHER): Payer: Self-pay

## 2023-12-21 ENCOUNTER — Other Ambulatory Visit: Payer: Self-pay

## 2023-12-21 ENCOUNTER — Other Ambulatory Visit (HOSPITAL_COMMUNITY): Payer: Self-pay

## 2023-12-21 ENCOUNTER — Other Ambulatory Visit (HOSPITAL_BASED_OUTPATIENT_CLINIC_OR_DEPARTMENT_OTHER): Payer: Self-pay

## 2023-12-25 ENCOUNTER — Other Ambulatory Visit (HOSPITAL_COMMUNITY): Payer: Self-pay

## 2023-12-25 ENCOUNTER — Other Ambulatory Visit (HOSPITAL_BASED_OUTPATIENT_CLINIC_OR_DEPARTMENT_OTHER): Payer: Self-pay

## 2023-12-26 ENCOUNTER — Other Ambulatory Visit (HOSPITAL_COMMUNITY): Payer: Self-pay

## 2023-12-26 ENCOUNTER — Telehealth: Payer: Self-pay | Admitting: Family Medicine

## 2023-12-26 NOTE — Telephone Encounter (Signed)
 Patient has been called and informed of appointment details to discuss incontinence supplies.

## 2023-12-26 NOTE — Telephone Encounter (Signed)
 Copied from CRM [id number]. Topic: Clinical - Prescription Issue >> Dec 26, 2023 11:00 AM Angela Gardner wrote:  Reason for CRM: Pt called requesting to have her adult diapers changed from 2X to just a large. Please advise

## 2023-12-27 ENCOUNTER — Ambulatory Visit: Attending: Family Medicine | Admitting: Family Medicine

## 2023-12-27 ENCOUNTER — Encounter: Payer: Self-pay | Admitting: Family Medicine

## 2023-12-27 ENCOUNTER — Other Ambulatory Visit: Payer: Self-pay

## 2023-12-27 DIAGNOSIS — R32 Unspecified urinary incontinence: Secondary | ICD-10-CM | POA: Diagnosis not present

## 2023-12-27 DIAGNOSIS — N3281 Overactive bladder: Secondary | ICD-10-CM

## 2023-12-27 DIAGNOSIS — N39498 Other specified urinary incontinence: Secondary | ICD-10-CM

## 2023-12-27 DIAGNOSIS — R29898 Other symptoms and signs involving the musculoskeletal system: Secondary | ICD-10-CM | POA: Diagnosis not present

## 2023-12-27 MED ORDER — MISC. DEVICES MISC: 12 refills | Status: AC

## 2023-12-27 MED ORDER — SOLIFENACIN SUCCINATE 5 MG PO TABS
5.0000 mg | ORAL_TABLET | Freq: Every day | ORAL | 1 refills | Status: AC
  Filled 2023-12-27 – 2024-01-09 (×2): qty 90, 90d supply, fill #0
  Filled 2024-04-02: qty 90, 90d supply, fill #1

## 2023-12-27 NOTE — Telephone Encounter (Signed)
 noted

## 2023-12-27 NOTE — Telephone Encounter (Unsigned)
 Copied from CRM [id number]. Topic: Clinical - Prescription Issue >> Dec 26, 2023 11:00 AM Rosaria BRAVO wrote: Reason for CRM: Pt called requesting to have her adult diapers changed from 2X to just a large. Please advise >> Dec 27, 2023  9:33 AM Larissa RAMAN wrote: Dwayne with [facility] calling to confirm if fax was received for prescription for size change of incontinence supplies. States fax was sent yesterday and will refax prescription today.  Callback # [phone]

## 2023-12-27 NOTE — Patient Instructions (Signed)
 VISIT SUMMARY:  Today, we discussed your severe urinary incontinence and mobility issues. You mentioned that you need a change in diaper size and are experiencing difficulty walking long distances, which sometimes requires the use of a movable chair.  YOUR PLAN:  -URINARY INCONTINENCE: Urinary incontinence is the loss of bladder control, leading to accidental urine leakage. To help manage your symptoms, we have prescribed VESIcare , which is a medication for overactive bladder. Additionally, we have updated your diaper size to large.  -MOBILITY ISSUES: Mobility issues refer to difficulties in walking or moving around, often due to leg weakness. We will schedule an office visit to evaluate your mobility and determine if you are eligible for a handicap parking permit to help you with your outings.  INSTRUCTIONS:  Please schedule an office visit to evaluate your mobility issues and determine eligibility for a handicap parking permit.

## 2023-12-27 NOTE — Progress Notes (Signed)
 Virtual Visit via Video Note  I connected with Angela Gardner, on 12/27/2023 at 2:26 PM by video enabled telemedicine device and verified that I am speaking with the correct person using two identifiers.    Clinician has audio - video capabilities but patient was only able to operate/preferred audio.   Consent: I discussed the limitations, risks, security and privacy concerns of performing an evaluation and management service by telemedicine and the availability of in person appointments. I also discussed with the patient that there may be a patient responsible charge related to this service. The patient expressed understanding and agreed to proceed.   Location of Patient: Home  Location of Provider: Clinic   Persons participating in Telemedicine visit: Regan Mcbryar Dr. Takoya Jonas Jorge Tapia Cedeno PA-C    Discussed the use of AI scribe software for clinical note transcription with the patient, who gave verbal consent to proceed.  History of Present Illness Angela Gardner is a 63 year old female hypertension, stage III chronic kidney disease, diastolic CHF (EF 60 to 65% from 07/2018), Endometrial carcinoma diagnosed in 09/2017 s/p total abdominal hysterectomy and bilateral salpingo-oophorectomy on 11/14/2017, status post radiation and chemotherapy presents for a routine checkup. She completed treatment for endometrial cancer in June 2019 who presents with urinary incontinence.  She experiences severe urinary incontinence, requiring constant use of adult diapers. She requests a change in diaper size from 2X to large, currently supplied by [facility].  She inquires about obtaining a handicap sticker due to difficulty walking long distances, as her legs are 'beginning to collapse'. She sometimes uses a movable chair for assistance and seeks greater independence in her outings.      Past Medical History:  Diagnosis Date   Acute bronchitis 12/04/2008   Qualifier: Diagnosis of  By:  Janna MD, Burnard     Acute CHF (congestive heart failure) (HCC) 09/12/2017   Acute exacerbation of CHF (congestive heart failure) (HCC) 09/13/2017   Anemia    Antineoplastic chemotherapy induced anemia 04/05/2018   Cancer (HCC)    left ductal papilloma   Chest discomfort 09/12/2017   Negative stress test in May 2019   Chest pain 12/20/2019   CHF (congestive heart failure) (HCC)    Chronic kidney disease (CKD), stage III (moderate) 10/30/2017   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 Please schedule an appointment with Dr. Ladamien Rammel 90 tablet 1   atorvastatin  (LIPITOR) 80 MG tablet Take 1 tablet (80 mg total) by mouth daily. 90 tablet 1   FLUoxetine  (PROZAC ) 20 MG capsule Take 1 capsule (20 mg total) by mouth daily. 90 capsule 1   furosemide  (LASIX ) 40 MG tablet Take 1 tablet (40 mg total) by mouth 2 (two) times daily. 180 tablet 1   gabapentin  (NEURONTIN ) 300 MG capsule Take 1 capsule (300 mg total) by mouth at bedtime. 90 capsule 1   hydrALAZINE  (APRESOLINE ) 100 MG tablet Take 1 tablet (100 mg total) by mouth 3 (three) times daily. 90 tablet 1   losartan  (COZAAR ) 50 MG tablet Take 1 tablet (50 mg total) by mouth daily. 90 tablet 1   Misc. Devices MISC Bedside commode.  Diagnosis urinary incontinence (Patient not taking: Reported on 09/28/2023) 1 each 0   pantoprazole  (PROTONIX ) 40 MG tablet Take 1 tablet (40 mg total) by mouth daily. 90 tablet 1   [DISCONTINUED] prochlorperazine  (COMPAZINE ) 10 MG tablet Take 1 tablet (10 mg total) by mouth every 6 (six) hours as needed (Nausea or vomiting). (Patient not taking: Reported on 05/25/2018) 30 tablet 1   No current facility-administered medications on file prior to visit.    ROS: See HPI  Observations/Objective: Awake, alert, oriented x3 Not in acute distress Normal mood      Latest Ref Rng & Units 09/19/2023    8:58 AM 07/31/2023    4:22 AM 07/30/2023    5:11 AM  CMP  Glucose 70 - 99 mg/dL 93  878  885   BUN 8 - 27 mg/dL 28  40  37   Creatinine 0.57 - 1.00 mg/dL 8.04  7.75  7.83   Sodium 134 - 144 mmol/L 142  136  138   Potassium 3.5 - 5.2 mmol/L 4.1  4.1  3.3   Chloride 96 - 106 mmol/L 105  108  107   CO2 20 - 29 mmol/L 21  21  20    Calcium  8.7 - 10.3 mg/dL 9.6  8.8  8.7   Total Protein 6.0 - 8.5 g/dL 6.8     Total Bilirubin 0.0 - 1.2 mg/dL 0.5     Alkaline Phos 44 - 121 IU/L 122     AST 0 - 40 IU/L 18     ALT 0 - 32 IU/L 16       Lipid Panel     Component Value Date/Time   CHOL  201 (H) 09/19/2023 0858   TRIG 78 09/19/2023 0858   HDL 86 09/19/2023 0858   CHOLHDL 3.2 07/02/2019 1135   CHOLHDL 3.4 Ratio 03/30/2009 2039   VLDL 28 03/30/2009 2039   LDLCALC 101 (H) 09/19/2023 0858   LABVLDL 14 09/19/2023 0858    Lab Results  Component Value Date   HGBA1C 6.0 09/18/2023     Assessment and plan:  Assessment & Plan Urinary incontinence/overactive bladder Uncontrolled incontinence requiring diapers, affecting social activities and mobility. VESIcare  discussed for symptom management. - Prescribed VESIcare  for overactive bladder symptoms. - Prescribed large size diapers.  Mobility issues/ leg weakness Leg weakness and collapsing affecting ambulation over long  distances. Evaluation needed for handicap parking permit. - Schedule office visit to evaluate mobility issues and determine eligibility for handicap parking permit.   There are no diagnoses linked to this encounter.   Meds ordered this encounter  Medications   solifenacin  (VESICARE ) 5 MG tablet    Sig: Take 1 tablet (5 mg total) by mouth daily.    Dispense:  90 tablet    Refill:  1   Misc. Devices MISC    Sig: Large size Diapers. Diagnosis - urinary incontinence    Dispense:  1 Box    Refill:  12    Follow Up Instructions: Return for previously scheduled appointment.    I discussed the assessment and treatment plan with the patient. The patient was provided an opportunity to ask questions and all were answered. The patient agreed with the plan and demonstrated an understanding of the instructions.   The patient was advised to call back or seek an in-person evaluation if the symptoms worsen or if the condition fails to improve as anticipated.     I provided 12 minutes total of Telehealth time during this encounter including median intraservice time, reviewing previous notes, investigations, ordering medications, medical decision making, coordinating care and patient verbalized understanding at  the end of the visit.     Corrina Sabin, MD, FAAFP. [facility] [place], [place] [phone]   12/27/2023, 2:26 PM

## 2023-12-27 NOTE — Telephone Encounter (Signed)
 Fax has been received from [facility] for Incontinence Order.

## 2024-01-04 ENCOUNTER — Other Ambulatory Visit: Payer: Self-pay

## 2024-01-09 ENCOUNTER — Other Ambulatory Visit (HOSPITAL_COMMUNITY): Payer: Self-pay

## 2024-01-09 ENCOUNTER — Other Ambulatory Visit: Payer: Self-pay

## 2024-02-02 ENCOUNTER — Other Ambulatory Visit: Payer: Self-pay

## 2024-02-02 ENCOUNTER — Other Ambulatory Visit: Payer: Self-pay | Admitting: Family Medicine

## 2024-02-02 DIAGNOSIS — N183 Hypertensive chronic kidney disease with stage 1 through stage 4 chronic kidney disease, or unspecified chronic kidney disease: Secondary | ICD-10-CM

## 2024-02-02 MED ORDER — HYDRALAZINE HCL 100 MG PO TABS: 100.0000 mg | ORAL_TABLET | Freq: Three times a day (TID) | ORAL | 1 refills | Status: AC

## 2024-02-05 ENCOUNTER — Other Ambulatory Visit (HOSPITAL_BASED_OUTPATIENT_CLINIC_OR_DEPARTMENT_OTHER): Payer: Self-pay

## 2024-02-05 MED ORDER — AMOXICILLIN 500 MG PO CAPS
500.0000 mg | ORAL_CAPSULE | Freq: Three times a day (TID) | ORAL | 0 refills | Status: DC
  Filled 2024-02-05 – 2024-02-20 (×4): qty 21, 7d supply, fill #0

## 2024-02-05 MED ORDER — IBUPROFEN 800 MG PO TABS
800.0000 mg | ORAL_TABLET | Freq: Three times a day (TID) | ORAL | 1 refills | Status: AC | PRN
  Filled 2024-02-05: qty 20, 7d supply, fill #0

## 2024-02-05 MED ORDER — CHLORHEXIDINE GLUCONATE 0.12 % MT SOLN
OROMUCOSAL | 0 refills | Status: DC
  Filled 2024-02-05: qty 473, 10d supply, fill #0
  Filled 2024-02-16: qty 473, fill #0
  Filled 2024-02-19: qty 473, 17d supply, fill #0
  Filled 2024-02-20: qty 473, 16d supply, fill #0

## 2024-02-15 ENCOUNTER — Other Ambulatory Visit (HOSPITAL_BASED_OUTPATIENT_CLINIC_OR_DEPARTMENT_OTHER): Payer: Self-pay

## 2024-02-16 ENCOUNTER — Other Ambulatory Visit: Payer: Self-pay

## 2024-02-19 ENCOUNTER — Other Ambulatory Visit: Payer: Self-pay

## 2024-02-20 ENCOUNTER — Ambulatory Visit: Payer: 59 | Attending: Family Medicine

## 2024-02-20 ENCOUNTER — Other Ambulatory Visit (HOSPITAL_COMMUNITY): Payer: Self-pay

## 2024-02-20 ENCOUNTER — Other Ambulatory Visit: Payer: Self-pay

## 2024-02-20 VITALS — Ht 64.0 in | Wt 240.0 lb

## 2024-02-20 DIAGNOSIS — Z0000 Encounter for general adult medical examination without abnormal findings: Secondary | ICD-10-CM

## 2024-02-20 DIAGNOSIS — Z1211 Encounter for screening for malignant neoplasm of colon: Secondary | ICD-10-CM

## 2024-02-20 DIAGNOSIS — Z0100 Encounter for examination of eyes and vision without abnormal findings: Secondary | ICD-10-CM

## 2024-02-20 NOTE — Progress Notes (Addendum)
 Because this visit was a virtual/telehealth visit,  certain criteria was not obtained, such a blood pressure, CBG if applicable, and timed get up and go. Any medications not marked as taking were not mentioned during the medication reconciliation part of the visit. Any vitals not documented were not able to be obtained due to this being a telehealth visit or patient was unable to self-report a recent blood pressure reading due to a lack of equipment at home via telehealth. Vitals that have been documented are verbally provided by the patient.   Subjective:   Angela Gardner is a 63 y.o. who presents for a Medicare Wellness preventive visit.  As a reminder, Annual Wellness Visits don't include a physical exam, and some assessments may be limited, especially if this visit is performed virtually. We may recommend an in-person follow-up visit with your provider if needed.  Visit Complete: Virtual I connected with  Angela Gardner on 02/20/24 by a audio enabled telemedicine application and verified that I am speaking with the correct person using two identifiers.  Patient Location: Home  Provider Location: Office/Clinic  I discussed the limitations of evaluation and management by telemedicine. The patient expressed understanding and agreed to proceed.  Vital Signs: Because this visit was a virtual/telehealth visit, some criteria may be missing or patient reported. Any vitals not documented were not able to be obtained and vitals that have been documented are patient reported.  VideoDeclined- This patient declined Librarian, academic. Therefore the visit was completed with audio only.  Persons Participating in Visit: Patient.  AWV Questionnaire: No: Patient Medicare AWV questionnaire was not completed prior to this visit.  Cardiac Risk Factors include: advanced age (>17men, >49 women);dyslipidemia;family history of premature cardiovascular  disease;hypertension;obesity (BMI >30kg/m2);sedentary lifestyle     Objective:    Today's Vitals   02/20/24 0953  Weight: 240 lb (108.9 kg)  Height: 5' 4 (1.626 m)  PainSc: 0-No pain   Body mass index is 41.2 kg/m.     02/20/2024    9:56 AM 07/28/2023    6:00 AM 07/27/2023    2:02 PM 07/24/2023    3:37 PM 04/24/2023   11:26 AM 02/14/2023   10:52 AM 05/31/2022    3:08 PM  Advanced Directives  Does Patient Have a Medical Advance Directive? No No No No No No Yes  Type of Building surveyor of Healthcare Power of Attorney in Chart?       No - copy requested  Would patient like information on creating a medical advance directive? No - Patient declined No - Patient declined No - Patient declined   Yes (MAU/Ambulatory/Procedural Areas - Information given)     Current Medications (verified) Outpatient Encounter Medications as of 02/20/2024  Medication Sig   amLODipine  (NORVASC ) 10 MG tablet Take 1 tablet (10 mg total) by mouth daily. Please schedule an appointment with Dr. Newlin   amoxicillin  (AMOXIL ) 500 MG capsule Take 1 capsule (500 mg total) by mouth every 8 (eight) hours until finished   atorvastatin  (LIPITOR) 80 MG tablet Take 1 tablet (80 mg total) by mouth daily.   chlorhexidine  (PERIDEX ) 0.12 % solution Swish for one minute and expectorate twice daily for 10 days.   FLUoxetine  (PROZAC ) 20 MG capsule Take 1 capsule (20 mg total) by mouth daily.   furosemide  (LASIX ) 40 MG tablet Take 1 tablet (40 mg total) by mouth 2 (two) times daily.   gabapentin  (NEURONTIN )  300 MG capsule Take 1 capsule (300 mg total) by mouth at bedtime.   hydrALAZINE  (APRESOLINE ) 100 MG tablet Take 1 tablet (100 mg total) by mouth 3 (three) times daily.   ibuprofen  (ADVIL ) 800 MG tablet Take 1 tablet (800 mg total) by mouth every 8 (eight) hours as needed.   losartan  (COZAAR ) 50 MG tablet Take 1 tablet (50 mg total) by mouth daily.   Misc. Devices MISC Bedside commode.   Diagnosis urinary incontinence (Patient not taking: Reported on 09/28/2023)   Misc. Devices MISC Large size Diapers. Diagnosis - urinary incontinence   pantoprazole  (PROTONIX ) 40 MG tablet Take 1 tablet (40 mg total) by mouth daily.   solifenacin  (VESICARE ) 5 MG tablet Take 1 tablet (5 mg total) by mouth daily.   [DISCONTINUED] prochlorperazine  (COMPAZINE ) 10 MG tablet Take 1 tablet (10 mg total) by mouth every 6 (six) hours as needed (Nausea or vomiting). (Patient not taking: Reported on 05/25/2018)   No facility-administered encounter medications on file as of 02/20/2024.    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   Cancer (HCC)    left ductal papilloma   Chest discomfort 09/12/2017   Negative stress test in May 2019   Chest pain 12/20/2019   CHF (congestive heart failure) (HCC)    Chronic kidney disease (CKD), stage III (moderate) 10/30/2017   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   PMB (postmenopausal bleeding) 06/17/2015   Prediabetes 04/02/2018   Preventative health care 11/07/2018   Renal disorder    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   Vitamin D  deficiency 02/15/2018   Past Surgical History:  Procedure Laterality Date   BREAST EXCISIONAL BIOPSY Left 2013   CESAREAN SECTION     one   CHOLECYSTECTOMY     DILATION AND CURETTAGE OF UTERUS     approx 1 year ago   IR IMAGING GUIDED PORT INSERTION  12/06/2017   IR REMOVAL TUN ACCESS W/ PORT W/O FL MOD SED  06/19/2018   LYMPH NODE BIOPSY N/A 11/14/2017   Procedure: SENTINEL LYMPH NODE BIOPSY;  Surgeon: Eloy Herring, MD;  Location: [facility];  Service: Gynecology;  Laterality: N/A;   ROBOTIC ASSISTED TOTAL HYSTERECTOMY WITH BILATERAL SALPINGO OOPHERECTOMY N/A 11/14/2017   Procedure: XI ROBOTIC ASSISTED TOTAL HYSTERECTOMY WITH BILATERAL SALPINGO OOPHORECTOMY, RIGHT PELVIC LYMPHADENECTOMY;  Surgeon: Eloy Herring, MD;  Location: [facility];  Service: Gynecology;  Laterality: N/A;   Family History  Problem Relation Age of Onset   Anuerysm Mother    Congestive Heart Failure Mother    Hypertension Father    Hypertension Sister    Hypertension Brother    Colon cancer Neg Hx    Social History   Socioeconomic History   Marital status: Single    Spouse name: Not on file   Number of children: 1   Years of education: Not on file   Highest education level: 9th grade  Occupational History   Not on file  Tobacco Use   Smoking status: Former    Current packs/day: 0.30    Average packs/day: 0.3 packs/day for 26.0 years (7.8 ttl pk-yrs)    Types: Cigarettes   Smokeless tobacco: Never    Tobacco comments:    quit May 2019  Vaping Use   Vaping status: Never Used  Substance and Sexual Activity   Alcohol  use: Not Currently    Comment: social drinking   Drug use: Not Currently    Types: Cocaine, Crack cocaine   Sexual activity: Not Currently    Birth control/protection: Post-menopausal  Other Topics Concern   Not on file  Social History Narrative   Not on file   Social Drivers of Health   Financial Resource Strain: Low Risk  (02/20/2024)   Overall Financial Resource Strain (CARDIA)    Difficulty of Paying Living Expenses: Not very hard  Food Insecurity: Food Insecurity Present (02/20/2024)   Hunger Vital Sign    Worried About Running Out of Food in the Last Year: Sometimes true    Ran Out of Food in the Last Year: Sometimes true  Transportation Needs: Unmet Transportation Needs (02/20/2024)   PRAPARE - Administrator, Civil Service (Medical): Yes    Lack of Transportation (Non-Medical): No  Physical Activity: Inactive (02/20/2024)   Exercise Vital Sign    Days of Exercise per Week: 0 days    Minutes of Exercise per Session: 0 min  Stress: No Stress Concern Present (02/20/2024)   [employer] of Occupational Health - Occupational Stress Questionnaire    Feeling of Stress: Not at all  Social Connections: Socially Isolated (02/20/2024)   Social Connection and Isolation Panel    Frequency of Communication with Friends and Family: More than three times a week    Frequency of Social Gatherings with Friends and Family: Twice a week    Attends Religious Services: Never    Database administrator or Organizations: No    Attends Banker Meetings: Never    Marital Status: Never married    Tobacco Counseling Counseling given: Not Answered Tobacco comments: quit May 2019    Clinical Intake:  Pre-visit preparation completed: Yes  Pain : No/denies pain Pain Score: 0-No pain     BMI - recorded: 41.2 Nutritional Status: BMI > 30   Obese Nutritional Risks: None Diabetes: No  Lab Results  Component Value Date   HGBA1C 6.0 09/18/2023   HGBA1C 6.0 07/03/2018   HGBA1C 6.1 04/02/2018     How often do you need to have someone help you when you read instructions, pamphlets, or other written materials from your doctor or pharmacy?: 1 - Never What  is the last grade level you completed in school?: 9TH GRADE  Interpreter Needed?: No  Information entered by :: Laura Radilla N. Calina Patrie, LPN.   Activities of Daily Living     02/20/2024   10:00 AM 07/28/2023    6:00 AM  In your present state of health, do you have any difficulty performing the following activities:  Hearing? 0 0  Vision? 0 0  Difficulty concentrating or making decisions? 0 0  Comment BSE: GAMES ON PHONE, READING   Walking or climbing stairs? 1   Comment CANE, WALKER   Dressing or bathing? 0   Doing errands, shopping? 0   Preparing Food and eating ? N   Using the Toilet? N   In the past six months, have you accidently leaked urine? N   Do you have problems with loss of bowel control? N   Managing your Medications? N   Managing your Finances? N   Housekeeping or managing your Housekeeping? N     Patient Care Team: Delbert Clam, MD as PCP - General (Family Medicine) Bernie Lamar PARAS, MD as PCP - Cardiology (Cardiology) Rogelio Planas, MD as Consulting Physician (Obstetrics and Gynecology)  I have updated your Care Teams any recent Medical Services you may have received from other providers in the past year.     Assessment:   This is a routine wellness examination for [place].  Hearing/Vision screen Hearing Screening - Comments:: Denies hearing difficulties.  Vision Screening - Comments:: Wears reading glasses - not up to date with routine eye exams.    Goals Addressed             This Visit's Progress    02/20/24: To spend more time with my daughter and grandkids.         Depression Screen     02/20/2024    9:59 AM 09/18/2023     6:19 PM 02/14/2023   10:50 AM 09/23/2022    9:37 AM 05/31/2022    3:10 PM 03/07/2022    4:23 PM 07/02/2019    1:13 PM  PHQ 2/9 Scores  PHQ - 2 Score 0 0 3 5 0 6 0  PHQ- 9 Score 0 0 10 20  19  0    Fall Risk     02/20/2024    9:56 AM 09/28/2023    3:12 PM 09/18/2023    6:19 PM 02/14/2023   10:51 AM 05/31/2022    3:09 PM  Fall Risk   Falls in the past year? 0 1 1 0 1  Comment     tripping  Number falls in past yr: 0 1 1 0 1  Injury with Fall? 0 0 0 0 0  Risk for fall due to : No Fall Risks  No Fall Risks No Fall Risks Medication side effect;Impaired mobility  Follow up Falls evaluation completed  Falls evaluation completed Falls prevention discussed;Education provided;Falls evaluation completed Falls evaluation completed;Education provided;Falls prevention discussed      Data saved with a previous flowsheet row definition    MEDICARE RISK AT HOME:  Medicare Risk at Home Any stairs in or around the home?: No (1 STEP ON FRONT ENTRY) If so, are there any without handrails?: No Home free of loose throw rugs in walkways, pet beds, electrical cords, etc?: Yes Adequate lighting in your home to reduce risk of falls?: Yes Life alert?: No Use of a cane, walker or w/c?: Yes Grab bars in the bathroom?: Yes Shower chair or bench in shower?: Yes Elevated  toilet seat or a handicapped toilet?: Yes (BEDSIDE COMMODE)  TIMED UP AND GO:  Was the test performed?  No  Cognitive Function: Declined/Normal: No cognitive concerns noted by patient or family. Patient alert, oriented, able to answer questions appropriately and recall recent events. No signs of memory loss or confusion.    02/20/2024    9:59 AM  MMSE - Mini Mental State Exam  Not completed: Unable to complete        02/20/2024   10:08 AM 02/14/2023   10:52 AM 05/31/2022    3:13 PM  6CIT Screen  What Year? 0 points 0 points 0 points  What month? 0 points 0 points 0 points  What time? 0 points 0 points 3 points  Count back from 20 0  points 0 points 0 points  Months in reverse 0 points 2 points 0 points  Repeat phrase 0 points 2 points 4 points  Total Score 0 points 4 points 7 points    Immunizations Immunization History  Administered Date(s) Administered   Influenza,inj,Quad PF,6+ Mos 07/03/2018, 03/08/2022   Influenza-Unspecified 07/03/2018, 05/14/2019    Screening Tests Health Maintenance  Topic Date Due   COVID-19 Vaccine (1) Never done   DTaP/Tdap/Td (1 - Tdap) Never done   Pneumococcal Vaccine: 50+ Years (1 of 2 - PCV) Never done   Zoster Vaccines- Shingrix (1 of 2) Never done   Colonoscopy  Never done   INFLUENZA VACCINE  01/19/2024   MAMMOGRAM  08/17/2024   Medicare Annual Wellness (AWV)  02/19/2025   Hepatitis C Screening  Completed   HIV Screening  Completed   Hepatitis B Vaccines 19-59 Average Risk  Aged Out   HPV VACCINES  Aged Out   Meningococcal B Vaccine  Aged Out    Health Maintenance  Health Maintenance Due  Topic Date Due   COVID-19 Vaccine (1) Never done   DTaP/Tdap/Td (1 - Tdap) Never done   Pneumococcal Vaccine: 50+ Years (1 of 2 - PCV) Never done   Zoster Vaccines- Shingrix (1 of 2) Never done   Colonoscopy  Never done   INFLUENZA VACCINE  01/19/2024   Health Maintenance Items Addressed: Cologuard Ordered, Referral sent to Optometry/Ophthalmology  Additional Screening:  Vision Screening: Recommended annual ophthalmology exams for early detection of glaucoma and other disorders of the eye. Would you like a referral to an eye doctor? Yes    Dental Screening: Recommended annual dental exams for proper oral hygiene  Community Resource Referral / Chronic Care Management: CRR required this visit?  No   CCM required this visit?  No   Plan:    I have personally reviewed and noted the following in the patient's chart:   Medical and social history Use of alcohol , tobacco or illicit drugs  Current medications and supplements including opioid prescriptions. Patient is not  currently taking opioid prescriptions. Functional ability and status Nutritional status Physical activity Advanced directives List of other physicians Hospitalizations, surgeries, and ER visits in previous 12 months Vitals Screenings to include cognitive, depression, and falls Referrals and appointments  In addition, I have reviewed and discussed with patient certain preventive protocols, quality metrics, and best practice recommendations. A written personalized care plan for preventive services as well as general preventive health recommendations were provided to patient.   Roz LOISE Fuller, LPN   [id number]   After Visit Summary: (MyChart) Due to this being a telephonic visit, the after visit summary with patients personalized plan was offered to patient via MyChart  Notes: Cologuard ordered and referral sent to [facility].

## 2024-02-20 NOTE — Patient Instructions (Signed)
 Angela Gardner , Thank you for taking time out of your busy schedule to complete your Annual Wellness Visit with me. I enjoyed our conversation and look forward to speaking with you again next year. I, as well as your care team,  appreciate your ongoing commitment to your health goals. Please review the following plan we discussed and let me know if I can assist you in the future. Your Game plan/ To Do List    Referrals: If you haven't heard from the office you've been referred to, please reach out to them at the phone provided.   Follow up Visits: We will see or speak with you next year for your Next Medicare AWV with our clinical staff Have you seen your provider in the last 6 months (3 months if uncontrolled diabetes)? Yes  Clinician Recommendations:  Aim for 30 minutes of exercise or brisk walking, 6-8 glasses of water , and 5 servings of fruits and vegetables each day.       This is a list of the screenings recommended for you:  Health Maintenance  Topic Date Due   COVID-19 Vaccine (1) Never done   DTaP/Tdap/Td vaccine (1 - Tdap) Never done   Pneumococcal Vaccine for age over 50 (1 of 2 - PCV) Never done   Zoster (Shingles) Vaccine (1 of 2) Never done   Colon Cancer Screening  Never done   Flu Shot  01/19/2024   Mammogram  08/17/2024   Medicare Annual Wellness Visit  02/19/2025   Hepatitis C Screening  Completed   HIV Screening  Completed   Hepatitis B Vaccine  Aged Out   HPV Vaccine  Aged Out   Meningitis B Vaccine  Aged Out    Advanced directives: (Declined) Advance directive discussed with you today. Even though you declined this today, please call our office should you change your mind, and we can give you the proper paperwork for you to fill out. Advance Care Planning is important because it:  [x]  Makes sure you receive the medical care that is consistent with your values, goals, and preferences  [x]  It provides guidance to your family and loved ones and reduces their  decisional burden about whether or not they are making the right decisions based on your wishes.  Follow the link provided in your after visit summary or read over the paperwork we have mailed to you to help you started getting your Advance Directives in place. If you need assistance in completing these, please reach out to us  so that we can help you!  See attachments for Preventive Care and Fall Prevention Tips.

## 2024-03-12 ENCOUNTER — Other Ambulatory Visit (HOSPITAL_BASED_OUTPATIENT_CLINIC_OR_DEPARTMENT_OTHER): Payer: Self-pay

## 2024-03-12 MED ORDER — AMOXICILLIN 500 MG PO CAPS
500.0000 mg | ORAL_CAPSULE | Freq: Three times a day (TID) | ORAL | 0 refills | Status: AC
  Filled 2024-03-12 – 2024-03-13 (×2): qty 21, 7d supply, fill #0

## 2024-03-12 MED ORDER — CHLORHEXIDINE GLUCONATE 0.12 % MT SOLN
OROMUCOSAL | 0 refills | Status: AC
  Filled 2024-03-12 – 2024-03-13 (×2): qty 473, 10d supply, fill #0

## 2024-03-12 MED ORDER — IBUPROFEN 800 MG PO TABS
800.0000 mg | ORAL_TABLET | Freq: Three times a day (TID) | ORAL | 1 refills | Status: AC | PRN
  Filled 2024-03-12 – 2024-03-13 (×2): qty 20, 7d supply, fill #0

## 2024-03-13 ENCOUNTER — Other Ambulatory Visit (HOSPITAL_COMMUNITY): Payer: Self-pay

## 2024-03-13 ENCOUNTER — Other Ambulatory Visit (HOSPITAL_BASED_OUTPATIENT_CLINIC_OR_DEPARTMENT_OTHER): Payer: Self-pay

## 2024-03-13 ENCOUNTER — Other Ambulatory Visit: Payer: Self-pay

## 2024-03-15 ENCOUNTER — Ambulatory Visit: Payer: Self-pay | Admitting: Family Medicine

## 2024-03-15 LAB — COLOGUARD: COLOGUARD: NEGATIVE

## 2024-04-02 ENCOUNTER — Other Ambulatory Visit: Payer: Self-pay | Admitting: Family Medicine

## 2024-04-02 ENCOUNTER — Other Ambulatory Visit: Payer: Self-pay

## 2024-04-02 DIAGNOSIS — N183 Chronic kidney disease, stage 3 unspecified: Secondary | ICD-10-CM

## 2024-04-02 DIAGNOSIS — I5032 Chronic diastolic (congestive) heart failure: Secondary | ICD-10-CM

## 2024-04-02 MED ORDER — PANTOPRAZOLE SODIUM 40 MG PO TBEC
40.0000 mg | DELAYED_RELEASE_TABLET | Freq: Every day | ORAL | 0 refills | Status: AC
  Filled 2024-07-17 (×2): qty 90, 90d supply, fill #0

## 2024-04-02 MED ORDER — LOSARTAN POTASSIUM 50 MG PO TABS: 50.0000 mg | ORAL_TABLET | Freq: Every day | ORAL | 0 refills | Status: DC

## 2024-04-02 MED ORDER — FUROSEMIDE 40 MG PO TABS: 40.0000 mg | ORAL_TABLET | Freq: Two times a day (BID) | ORAL | 0 refills | Status: AC

## 2024-04-02 MED ORDER — FLUOXETINE HCL 20 MG PO CAPS
20.0000 mg | ORAL_CAPSULE | Freq: Every day | ORAL | 0 refills | Status: AC
  Filled 2024-07-17 (×2): qty 90, 90d supply, fill #0

## 2024-04-02 MED ORDER — AMLODIPINE BESYLATE 10 MG PO TABS: 10.0000 mg | ORAL_TABLET | Freq: Every day | ORAL | 0 refills | Status: AC

## 2024-04-02 MED ORDER — ATORVASTATIN CALCIUM 80 MG PO TABS
80.0000 mg | ORAL_TABLET | Freq: Every day | ORAL | 0 refills | Status: AC
  Filled 2024-04-23: qty 90, 90d supply, fill #0

## 2024-04-03 ENCOUNTER — Other Ambulatory Visit: Payer: Self-pay

## 2024-04-03 ENCOUNTER — Other Ambulatory Visit (HOSPITAL_COMMUNITY): Payer: Self-pay

## 2024-04-03 MED ORDER — GABAPENTIN 300 MG PO CAPS
300.0000 mg | ORAL_CAPSULE | Freq: Every day | ORAL | 0 refills | Status: AC
  Filled 2024-05-29: qty 90, 90d supply, fill #0

## 2024-04-23 ENCOUNTER — Other Ambulatory Visit: Payer: Self-pay | Admitting: Pharmacist

## 2024-04-23 ENCOUNTER — Other Ambulatory Visit: Payer: Self-pay

## 2024-04-23 ENCOUNTER — Other Ambulatory Visit (HOSPITAL_COMMUNITY): Payer: Self-pay

## 2024-04-23 NOTE — Progress Notes (Signed)
 Pharmacy Quality Measure Review  This patient is appearing on a report for the adherence measure for cholesterol (statin) medications this calendar year.   Medication: atorvastatin  Last fill date: 01/10/2024 for 90 day supply  Contacted pharmacy to facilitate refills.  Herlene Fleeta Morris, PharmD, JAQUELINE, CPP Clinical Pharmacist [facility] [phone]

## 2024-04-25 ENCOUNTER — Other Ambulatory Visit: Payer: Self-pay

## 2024-04-25 ENCOUNTER — Other Ambulatory Visit: Payer: Self-pay | Admitting: Pharmacist

## 2024-04-25 MED ORDER — LOSARTAN POTASSIUM 50 MG PO TABS
50.0000 mg | ORAL_TABLET | Freq: Every day | ORAL | 0 refills | Status: DC
  Filled 2024-04-25: qty 90, 90d supply, fill #0

## 2024-05-29 ENCOUNTER — Other Ambulatory Visit (HOSPITAL_COMMUNITY): Payer: Self-pay

## 2024-07-16 ENCOUNTER — Other Ambulatory Visit: Payer: Self-pay

## 2024-07-16 ENCOUNTER — Encounter: Payer: Self-pay | Admitting: Pharmacy Technician

## 2024-07-16 ENCOUNTER — Other Ambulatory Visit: Payer: Self-pay | Admitting: Family Medicine

## 2024-07-16 DIAGNOSIS — I5032 Chronic diastolic (congestive) heart failure: Secondary | ICD-10-CM

## 2024-07-17 ENCOUNTER — Other Ambulatory Visit: Payer: Self-pay

## 2024-07-17 ENCOUNTER — Other Ambulatory Visit (HOSPITAL_COMMUNITY): Payer: Self-pay

## 2024-07-18 ENCOUNTER — Other Ambulatory Visit: Payer: Self-pay | Admitting: Family Medicine

## 2024-07-18 ENCOUNTER — Other Ambulatory Visit: Payer: Self-pay

## 2024-07-18 MED ORDER — LOSARTAN POTASSIUM 50 MG PO TABS: 50.0000 mg | ORAL_TABLET | Freq: Every day | ORAL | 0 refills | Status: AC

## 2025-02-25 ENCOUNTER — Ambulatory Visit
